# Patient Record
Sex: Male | Born: 1935 | Race: White | Hispanic: No | Marital: Married | Smoking: Never smoker
Health system: Southern US, Community
[De-identification: ages and names within clinical notes are randomized; demographics above are authoritative.]

## PROBLEM LIST (undated history)

## (undated) DIAGNOSIS — N200 Calculus of kidney: Secondary | ICD-10-CM

## (undated) DIAGNOSIS — Z8679 Personal history of other diseases of the circulatory system: Secondary | ICD-10-CM

## (undated) DIAGNOSIS — N320 Bladder-neck obstruction: Secondary | ICD-10-CM

## (undated) DIAGNOSIS — G8929 Other chronic pain: Secondary | ICD-10-CM

## (undated) DIAGNOSIS — I252 Old myocardial infarction: Secondary | ICD-10-CM

## (undated) DIAGNOSIS — M549 Dorsalgia, unspecified: Secondary | ICD-10-CM

## (undated) DIAGNOSIS — C61 Malignant neoplasm of prostate: Secondary | ICD-10-CM

## (undated) DIAGNOSIS — E119 Type 2 diabetes mellitus without complications: Secondary | ICD-10-CM

## (undated) DIAGNOSIS — Z86711 Personal history of pulmonary embolism: Secondary | ICD-10-CM

## (undated) DIAGNOSIS — K5730 Diverticulosis of large intestine without perforation or abscess without bleeding: Secondary | ICD-10-CM

## (undated) DIAGNOSIS — I959 Hypotension, unspecified: Secondary | ICD-10-CM

## (undated) DIAGNOSIS — Z85828 Personal history of other malignant neoplasm of skin: Secondary | ICD-10-CM

## (undated) DIAGNOSIS — F0390 Unspecified dementia without behavioral disturbance: Secondary | ICD-10-CM

## (undated) DIAGNOSIS — Z8669 Personal history of other diseases of the nervous system and sense organs: Secondary | ICD-10-CM

## (undated) DIAGNOSIS — I739 Peripheral vascular disease, unspecified: Secondary | ICD-10-CM

## (undated) DIAGNOSIS — E291 Testicular hypofunction: Secondary | ICD-10-CM

## (undated) DIAGNOSIS — T4145XA Adverse effect of unspecified anesthetic, initial encounter: Secondary | ICD-10-CM

## (undated) DIAGNOSIS — K219 Gastro-esophageal reflux disease without esophagitis: Secondary | ICD-10-CM

## (undated) DIAGNOSIS — M1990 Unspecified osteoarthritis, unspecified site: Secondary | ICD-10-CM

## (undated) DIAGNOSIS — G4733 Obstructive sleep apnea (adult) (pediatric): Secondary | ICD-10-CM

## (undated) DIAGNOSIS — Z9889 Other specified postprocedural states: Secondary | ICD-10-CM

## (undated) DIAGNOSIS — T8859XA Other complications of anesthesia, initial encounter: Secondary | ICD-10-CM

## (undated) DIAGNOSIS — E039 Hypothyroidism, unspecified: Secondary | ICD-10-CM

## (undated) DIAGNOSIS — R0789 Other chest pain: Secondary | ICD-10-CM

## (undated) DIAGNOSIS — Z860100 Personal history of colon polyps, unspecified: Secondary | ICD-10-CM

## (undated) DIAGNOSIS — Z8739 Personal history of other diseases of the musculoskeletal system and connective tissue: Secondary | ICD-10-CM

## (undated) DIAGNOSIS — F1021 Alcohol dependence, in remission: Secondary | ICD-10-CM

## (undated) DIAGNOSIS — Z8709 Personal history of other diseases of the respiratory system: Secondary | ICD-10-CM

## (undated) DIAGNOSIS — D649 Anemia, unspecified: Secondary | ICD-10-CM

## (undated) DIAGNOSIS — I440 Atrioventricular block, first degree: Secondary | ICD-10-CM

## (undated) DIAGNOSIS — R011 Cardiac murmur, unspecified: Secondary | ICD-10-CM

## (undated) DIAGNOSIS — I4510 Unspecified right bundle-branch block: Secondary | ICD-10-CM

## (undated) HISTORY — PX: LUMBAR FUSION: SHX111

## (undated) HISTORY — PX: CARDIOVASCULAR STRESS TEST: SHX262

## (undated) HISTORY — PX: PROSTATE CRYOABLATION: SUR358

## (undated) HISTORY — PX: TRANSTHORACIC ECHOCARDIOGRAM: SHX275

## (undated) HISTORY — PX: COLONOSCOPY W/ POLYPECTOMY: SHX1380

## (undated) HISTORY — PX: OTHER SURGICAL HISTORY: SHX169

## (undated) HISTORY — PX: TONSILLECTOMY: SUR1361

---

## 1973-03-11 HISTORY — PX: OTHER SURGICAL HISTORY: SHX169

## 1981-03-11 HISTORY — PX: HIATAL HERNIA REPAIR: SHX195

## 1996-03-11 HISTORY — PX: VENTRAL HERNIA REPAIR: SHX424

## 1998-05-19 ENCOUNTER — Inpatient Hospital Stay (HOSPITAL_COMMUNITY): Admission: EM | Admit: 1998-05-19 | Discharge: 1998-05-21 | Payer: Self-pay | Admitting: Emergency Medicine

## 1998-05-19 ENCOUNTER — Encounter: Payer: Self-pay | Admitting: Emergency Medicine

## 2000-05-26 ENCOUNTER — Encounter: Payer: Self-pay | Admitting: Family Medicine

## 2000-05-26 ENCOUNTER — Encounter: Admission: RE | Admit: 2000-05-26 | Discharge: 2000-05-26 | Payer: Self-pay | Admitting: Family Medicine

## 2001-10-14 ENCOUNTER — Ambulatory Visit (HOSPITAL_BASED_OUTPATIENT_CLINIC_OR_DEPARTMENT_OTHER): Admission: RE | Admit: 2001-10-14 | Discharge: 2001-10-14 | Payer: Self-pay | Admitting: Surgery

## 2001-10-14 HISTORY — PX: INGUINAL HERNIA REPAIR: SUR1180

## 2003-01-14 ENCOUNTER — Encounter: Admission: RE | Admit: 2003-01-14 | Discharge: 2003-01-14 | Payer: Self-pay | Admitting: Orthopedic Surgery

## 2003-01-25 ENCOUNTER — Ambulatory Visit (HOSPITAL_COMMUNITY): Admission: RE | Admit: 2003-01-25 | Discharge: 2003-01-25 | Payer: Self-pay | Admitting: Orthopedic Surgery

## 2003-01-25 HISTORY — PX: KNEE ARTHROSCOPY W/ MENISCECTOMY: SHX1879

## 2004-01-13 ENCOUNTER — Ambulatory Visit (HOSPITAL_COMMUNITY): Admission: RE | Admit: 2004-01-13 | Discharge: 2004-01-13 | Payer: Self-pay | Admitting: Gastroenterology

## 2005-02-11 ENCOUNTER — Ambulatory Visit (HOSPITAL_COMMUNITY): Admission: RE | Admit: 2005-02-11 | Discharge: 2005-02-11 | Payer: Self-pay | Admitting: Urology

## 2005-04-12 ENCOUNTER — Ambulatory Visit (HOSPITAL_COMMUNITY): Admission: RE | Admit: 2005-04-12 | Discharge: 2005-04-13 | Payer: Self-pay | Admitting: Urology

## 2005-10-10 ENCOUNTER — Ambulatory Visit: Payer: Self-pay | Admitting: Pulmonary Disease

## 2005-11-12 ENCOUNTER — Ambulatory Visit (HOSPITAL_BASED_OUTPATIENT_CLINIC_OR_DEPARTMENT_OTHER): Admission: RE | Admit: 2005-11-12 | Discharge: 2005-11-12 | Payer: Self-pay | Admitting: Pulmonary Disease

## 2005-11-17 ENCOUNTER — Ambulatory Visit: Payer: Self-pay | Admitting: Pulmonary Disease

## 2005-11-29 ENCOUNTER — Emergency Department (HOSPITAL_COMMUNITY): Admission: EM | Admit: 2005-11-29 | Discharge: 2005-11-29 | Payer: Self-pay | Admitting: Emergency Medicine

## 2005-12-10 ENCOUNTER — Ambulatory Visit: Payer: Self-pay | Admitting: Pulmonary Disease

## 2005-12-24 ENCOUNTER — Encounter: Admission: RE | Admit: 2005-12-24 | Discharge: 2005-12-24 | Payer: Self-pay | Admitting: Orthopedic Surgery

## 2006-01-22 ENCOUNTER — Ambulatory Visit: Payer: Self-pay | Admitting: Pulmonary Disease

## 2007-05-05 ENCOUNTER — Encounter: Admission: RE | Admit: 2007-05-05 | Discharge: 2007-05-05 | Payer: Self-pay | Admitting: Orthopedic Surgery

## 2008-10-18 ENCOUNTER — Encounter (INDEPENDENT_AMBULATORY_CARE_PROVIDER_SITE_OTHER): Payer: Self-pay | Admitting: Internal Medicine

## 2008-10-18 ENCOUNTER — Inpatient Hospital Stay (HOSPITAL_COMMUNITY): Admission: EM | Admit: 2008-10-18 | Discharge: 2008-10-23 | Payer: Self-pay | Admitting: Emergency Medicine

## 2009-12-09 HISTORY — PX: OTHER SURGICAL HISTORY: SHX169

## 2010-03-31 ENCOUNTER — Encounter: Payer: Self-pay | Admitting: Orthopedic Surgery

## 2010-05-23 ENCOUNTER — Other Ambulatory Visit (HOSPITAL_COMMUNITY): Payer: Self-pay | Admitting: Urology

## 2010-05-23 DIAGNOSIS — C61 Malignant neoplasm of prostate: Secondary | ICD-10-CM

## 2010-06-01 ENCOUNTER — Ambulatory Visit (HOSPITAL_COMMUNITY): Payer: Self-pay

## 2010-06-01 ENCOUNTER — Encounter (HOSPITAL_COMMUNITY): Payer: Self-pay

## 2010-06-01 ENCOUNTER — Encounter (HOSPITAL_COMMUNITY)
Admission: RE | Admit: 2010-06-01 | Discharge: 2010-06-01 | Disposition: A | Discharge status: Home / Self Care | Payer: Medicare Other | Source: Ambulatory Visit | Attending: Urology | Admitting: Urology

## 2010-06-01 DIAGNOSIS — C61 Malignant neoplasm of prostate: Secondary | ICD-10-CM | POA: Insufficient documentation

## 2010-06-01 MED ORDER — TECHNETIUM TC 99M MEDRONATE IV KIT
24.0000 | PACK | Freq: Once | INTRAVENOUS | Status: AC | PRN
Start: 2010-06-01 — End: 2010-06-01
  Administered 2010-06-01: 24 via INTRAVENOUS

## 2010-06-16 LAB — URINALYSIS, ROUTINE W REFLEX MICROSCOPIC
Bilirubin Urine: NEGATIVE
Glucose, UA: NEGATIVE mg/dL
Hgb urine dipstick: NEGATIVE
Ketones, ur: NEGATIVE mg/dL
Nitrite: NEGATIVE
Protein, ur: NEGATIVE mg/dL
Specific Gravity, Urine: 1.012 (ref 1.005–1.030)
Urobilinogen, UA: 0.2 mg/dL (ref 0.0–1.0)
pH: 5.5 (ref 5.0–8.0)

## 2010-06-16 LAB — CBC
HCT: 37.9 % — ABNORMAL LOW (ref 39.0–52.0)
HCT: 42 % (ref 39.0–52.0)
Hemoglobin: 11.6 g/dL — ABNORMAL LOW (ref 13.0–17.0)
Hemoglobin: 12.4 g/dL — ABNORMAL LOW (ref 13.0–17.0)
Hemoglobin: 12.5 g/dL — ABNORMAL LOW (ref 13.0–17.0)
Hemoglobin: 13.7 g/dL (ref 13.0–17.0)
MCHC: 32.7 g/dL (ref 30.0–36.0)
MCHC: 32.8 g/dL (ref 30.0–36.0)
MCHC: 32.9 g/dL (ref 30.0–36.0)
MCHC: 33.1 g/dL (ref 30.0–36.0)
MCHC: 33.4 g/dL (ref 30.0–36.0)
MCV: 89.7 fL (ref 78.0–100.0)
MCV: 90.7 fL (ref 78.0–100.0)
Platelets: 150 10*3/uL (ref 150–400)
Platelets: 235 10*3/uL (ref 150–400)
RBC: 4.13 MIL/uL — ABNORMAL LOW (ref 4.22–5.81)
RBC: 4.14 MIL/uL — ABNORMAL LOW (ref 4.22–5.81)
RBC: 4.63 MIL/uL (ref 4.22–5.81)
RDW: 15.7 % — ABNORMAL HIGH (ref 11.5–15.5)
RDW: 15.8 % — ABNORMAL HIGH (ref 11.5–15.5)
RDW: 15.8 % — ABNORMAL HIGH (ref 11.5–15.5)
RDW: 16 % — ABNORMAL HIGH (ref 11.5–15.5)
RDW: 16.1 % — ABNORMAL HIGH (ref 11.5–15.5)
WBC: 10 10*3/uL (ref 4.0–10.5)
WBC: 7.4 10*3/uL (ref 4.0–10.5)

## 2010-06-16 LAB — COMPREHENSIVE METABOLIC PANEL
ALT: 12 U/L (ref 0–53)
AST: 14 U/L (ref 0–37)
Albumin: 3 g/dL — ABNORMAL LOW (ref 3.5–5.2)
Alkaline Phosphatase: 72 U/L (ref 39–117)
BUN: 15 mg/dL (ref 6–23)
CO2: 25 mEq/L (ref 19–32)
Calcium: 8.4 mg/dL (ref 8.4–10.5)
Chloride: 109 mEq/L (ref 96–112)
Creatinine, Ser: 1.76 mg/dL — ABNORMAL HIGH (ref 0.4–1.5)
GFR calc Af Amer: 46 mL/min — ABNORMAL LOW (ref >60)
GFR calc non Af Amer: 38 mL/min — ABNORMAL LOW (ref >60)
Glucose, Bld: 73 mg/dL (ref 70–99)
Potassium: 3.6 mEq/L (ref 3.5–5.1)
Sodium: 140 mEq/L (ref 135–145)
Total Bilirubin: 0.4 mg/dL (ref 0.3–1.2)
Total Protein: 5.9 g/dL — ABNORMAL LOW (ref 6.0–8.3)

## 2010-06-16 LAB — BASIC METABOLIC PANEL
BUN: 18 mg/dL (ref 6–23)
BUN: 8 mg/dL (ref 6–23)
CO2: 25 mEq/L (ref 19–32)
CO2: 26 mEq/L (ref 19–32)
CO2: 26 mEq/L (ref 19–32)
CO2: 26 mEq/L (ref 19–32)
CO2: 27 mEq/L (ref 19–32)
Calcium: 8.2 mg/dL — ABNORMAL LOW (ref 8.4–10.5)
Calcium: 8.3 mg/dL — ABNORMAL LOW (ref 8.4–10.5)
Calcium: 8.3 mg/dL — ABNORMAL LOW (ref 8.4–10.5)
Calcium: 8.3 mg/dL — ABNORMAL LOW (ref 8.4–10.5)
Calcium: 9.4 mg/dL (ref 8.4–10.5)
Chloride: 107 mEq/L (ref 96–112)
Creatinine, Ser: 0.94 mg/dL (ref 0.4–1.5)
Creatinine, Ser: 0.97 mg/dL (ref 0.4–1.5)
Creatinine, Ser: 1.01 mg/dL (ref 0.4–1.5)
Creatinine, Ser: 1.05 mg/dL (ref 0.4–1.5)
Creatinine, Ser: 2.36 mg/dL — ABNORMAL HIGH (ref 0.4–1.5)
GFR calc Af Amer: 33 mL/min — ABNORMAL LOW (ref >60)
GFR calc Af Amer: >60 mL/min (ref >60)
GFR calc Af Amer: >60 mL/min (ref >60)
GFR calc Af Amer: >60 mL/min (ref >60)
GFR calc non Af Amer: 27 mL/min — ABNORMAL LOW (ref >60)
GFR calc non Af Amer: >60 mL/min (ref >60)
GFR calc non Af Amer: >60 mL/min (ref >60)
GFR calc non Af Amer: >60 mL/min (ref >60)
Glucose, Bld: 120 mg/dL — ABNORMAL HIGH (ref 70–99)
Glucose, Bld: 122 mg/dL — ABNORMAL HIGH (ref 70–99)
Glucose, Bld: 125 mg/dL — ABNORMAL HIGH (ref 70–99)
Glucose, Bld: 68 mg/dL — ABNORMAL LOW (ref 70–99)
Glucose, Bld: 85 mg/dL (ref 70–99)
Potassium: 3.8 mEq/L (ref 3.5–5.1)
Potassium: 4.2 mEq/L (ref 3.5–5.1)
Sodium: 138 mEq/L (ref 135–145)
Sodium: 138 mEq/L (ref 135–145)
Sodium: 139 mEq/L (ref 135–145)
Sodium: 140 mEq/L (ref 135–145)

## 2010-06-16 LAB — DIFFERENTIAL
Basophils Absolute: 0 10*3/uL (ref 0.0–0.1)
Basophils Absolute: 0 10*3/uL (ref 0.0–0.1)
Basophils Relative: 0 % (ref 0–1)
Basophils Relative: 0 % (ref 0–1)
Eosinophils Absolute: 0.1 10*3/uL (ref 0.0–0.7)
Eosinophils Absolute: 0.3 10*3/uL (ref 0.0–0.7)
Eosinophils Relative: 1 % (ref 0–5)
Lymphocytes Relative: 14 % (ref 12–46)
Lymphs Abs: 1.4 10*3/uL (ref 0.7–4.0)
Monocytes Absolute: 0.6 10*3/uL (ref 0.1–1.0)
Monocytes Absolute: 0.7 10*3/uL (ref 0.1–1.0)
Monocytes Relative: 7 % (ref 3–12)
Neutro Abs: 5.2 10*3/uL (ref 1.7–7.7)
Neutro Abs: 7.8 10*3/uL — ABNORMAL HIGH (ref 1.7–7.7)
Neutrophils Relative %: 71 % (ref 43–77)
Neutrophils Relative %: 78 % — ABNORMAL HIGH (ref 43–77)

## 2010-06-16 LAB — GLUCOSE, CAPILLARY
Glucose-Capillary: 103 mg/dL — ABNORMAL HIGH (ref 70–99)
Glucose-Capillary: 115 mg/dL — ABNORMAL HIGH (ref 70–99)
Glucose-Capillary: 117 mg/dL — ABNORMAL HIGH (ref 70–99)
Glucose-Capillary: 117 mg/dL — ABNORMAL HIGH (ref 70–99)
Glucose-Capillary: 120 mg/dL — ABNORMAL HIGH (ref 70–99)
Glucose-Capillary: 139 mg/dL — ABNORMAL HIGH (ref 70–99)
Glucose-Capillary: 183 mg/dL — ABNORMAL HIGH (ref 70–99)
Glucose-Capillary: 241 mg/dL — ABNORMAL HIGH (ref 70–99)
Glucose-Capillary: 42 mg/dL — ABNORMAL LOW (ref 70–99)
Glucose-Capillary: 45 mg/dL — ABNORMAL LOW (ref 70–99)
Glucose-Capillary: 79 mg/dL (ref 70–99)
Glucose-Capillary: 86 mg/dL (ref 70–99)

## 2010-06-16 LAB — PROTIME-INR
INR: 1 (ref 0.00–1.49)
INR: 2.3 — ABNORMAL HIGH (ref 0.00–1.49)
INR: 2.3 — ABNORMAL HIGH (ref 0.00–1.49)
INR: 2.9 — ABNORMAL HIGH (ref 0.00–1.49)
Prothrombin Time: 12.9 seconds (ref 11.6–15.2)
Prothrombin Time: 25.3 seconds — ABNORMAL HIGH (ref 11.6–15.2)
Prothrombin Time: 25.4 seconds — ABNORMAL HIGH (ref 11.6–15.2)

## 2010-06-16 LAB — HEPARIN LEVEL (UNFRACTIONATED)
Heparin Unfractionated: 0.51 IU/mL (ref 0.30–0.70)
Heparin Unfractionated: 0.59 IU/mL (ref 0.30–0.70)
Heparin Unfractionated: 1.04 IU/mL — ABNORMAL HIGH (ref 0.30–0.70)

## 2010-06-16 LAB — CK TOTAL AND CKMB (NOT AT ARMC)
CK, MB: 1.7 ng/mL (ref 0.3–4.0)
CK, MB: 1.8 ng/mL (ref 0.3–4.0)
Relative Index: INVALID (ref 0.0–2.5)
Relative Index: INVALID (ref 0.0–2.5)
Relative Index: INVALID (ref 0.0–2.5)
Total CK: 71 U/L (ref 7–232)
Total CK: 76 U/L (ref 7–232)
Total CK: 88 U/L (ref 7–232)

## 2010-06-16 LAB — APTT: aPTT: 106 seconds — ABNORMAL HIGH (ref 24–37)

## 2010-06-16 LAB — HEMOGLOBIN A1C
Hgb A1c MFr Bld: 7.6 % — ABNORMAL HIGH (ref 4.6–6.1)
Mean Plasma Glucose: 171 mg/dL

## 2010-06-16 LAB — CULTURE, BLOOD (ROUTINE X 2): Culture: NO GROWTH

## 2010-06-16 LAB — URINE CULTURE: Colony Count: 10000

## 2010-06-16 LAB — LIPID PANEL
Cholesterol: 102 mg/dL (ref 0–200)
HDL: 45 mg/dL (ref >39)
LDL Cholesterol: 51 mg/dL (ref 0–99)
Total CHOL/HDL Ratio: 2.3 RATIO
Triglycerides: 30 mg/dL (ref <150)

## 2010-06-16 LAB — D-DIMER, QUANTITATIVE: D-Dimer, Quant: 0.58 ug/mL-FEU — ABNORMAL HIGH (ref 0.00–0.48)

## 2010-06-16 LAB — TROPONIN I: Troponin I: 0.03 ng/mL (ref 0.00–0.06)

## 2010-07-24 NOTE — H&P (Signed)
NAME:  Barry Mcdaniel, Barry Mcdaniel NO.:  [id number]   MEDICAL RECORD NO.:  [id number]          PATIENT TYPE:  EMS   LOCATION:  MAJO                         FACILITY:  [facility]   PHYSICIAN:  Michiel Cowboy, MDDATE OF BIRTH:  August 29, 1932   DATE OF ADMISSION:  10/18/2008  DATE OF DISCHARGE:                              HISTORY & PHYSICAL   PRIMARY CARE Endrit Gittins:  Dr. Lucianne Muss.   CHIEF COMPLAINT:  Chest pain.   The patient is a 77 year old gentleman with past medical history  significant for diabetes, GERD and high cholesterol, as well as mild  hypertension.  The patient has been at his baseline of health.  On  Monday, he had a very extensive day.  He had mowed the grass outside and  worked basically the whole time outside with very little p.o. intake.  By the end of the day, he started to feel weak and unwell.  He took his  blood pressure and was noted to be slightly low for him at 100/80.  He  continued to feel poorly and continued to measure his blood pressure.  Eventually, it started to drift down with systolics around 50s.  Of  note, around 10:00 p.m., he developed substernal chest pain,  nonradiating, as well as diaphoresis.  This lasted for about 30 minutes  and then went away.  He also had some neck pain throughout the night.  He has chronic back pain which was not indifferent today than his usual  presentation.  The chest pain did not radiate to his arms.  The neck  pain he attributed to maybe muscle spasm.  At this point, he is chest  pain free.  He was evaluated by the emergency department and given IV  fluids and his blood pressure started to come up.  He is currently  feeling much better, but being admitted for further evaluation.   REVIEW OF SYSTEMS:  As per HPI, otherwise unremarkable.  No diarrhea, no  vomiting, no nausea.  The patient did feel very weak and presyncopal  while his blood pressure was down.   PAST MEDICAL HISTORY:  1. Diabetes.  2. Acid  reflux.  3. Hypertension.  He is only taking a small dose of ramipril for it.  4. Hypothyroidism.  5. High cholesterol.   SOCIAL HISTORY:  The patient does not smoke or currently drink.  He had  a very remote history of alcohol abuse in the 1970s, but has been clean  ever since.   FAMILY HISTORY:  Significant for 12 siblings, seven of them dying with  coronary artery disease, but at an elderly age.   ALLERGIES:  PENICILLIN.   MEDICATIONS:  1. Actos 50 mg daily.  2. Aspirin 81 mg daily.  3. Crestor 20 mg daily.  4. Flomax 0.4 mg daily.  5. Neurontin 300 mg three times a day.  6. Iron.  7. Ramipril 2.5 daily.  8. Synthroid 75 mcg daily.  9. Insulin 78 units in the morning and NovoLog sliding scale.   PHYSICAL EXAMINATION:  VITAL SIGNS:  Temperature not done.  Blood  pressure initially 91/49, now up to 125/80s, pulse 118, now down to 92,  respirations 25.  Saturating 97% on room air.  GENERAL:  The patient currently appears to be in no acute distress.  HEENT:  Head nontraumatic.  Dry mucous membranes.  Pale mucosa.  Diminished skin turgor.  LUNGS:  Clear to auscultation, except at the  bases with slight crackles.  HEART:  Regular rate and rhythm.  No murmurs appreciated.  ABDOMEN:  Soft, nontender, nondistended.  LOWER EXTREMITIES:  Without clubbing, cyanosis or edema.  NEUROLOGICAL:  Intact.  SKIN:  Multiple areas of skin damage noted.   LABORATORY DATA:  White blood cell count 10, hemoglobin 13.7.  Sodium  140, potassium 4.2, creatinine 2.34.  He has no history of renal  disease.  Cardiac enzymes negative.  Chest x-ray showing hyperaeration,  but no infiltrate.  EKG showing questionable early repolarization in  lateral leads, but ischemic changes could not be ruled out.  Chest with  hyperaeration.  D-dimer 0.58.   ASSESSMENT/PLAN:  This is a 77 year old gentleman with;  1. Hypertension.  2. Dehydration.  3. Renal failure.  4. Chest pain.   1. Chest pain.  Given risk  factors, we will cycle cardiac enzymes.  I      am thinking this may have been demand ischemia secondary to      hypertension.  ED started him on heparin given elevated D-dimer.      We will continue that until PE can be ruled out.  Put in step-down      given recent hypotension.  If cardiac markers are positive, would      call cardiology.  We will order 2-D echo.  2. Positive D-dimer.  We will check a VQ scan in a.m. and heparinize      until then.  3. Acute renal failure, likely second to dehydration.  We will give IV      fluids.  Check orthostatics.  Check renal ultrasound.  Strict Is      and Os.  4. Questionable history of hypertension.  He was taking a low dose of      ramipril.  I doubt that is a true diagnosis of hypertension and he      is probably taking it for his diabetes.  Currently hypotensive.  We      will hold ramipril.  5. Diabetes.  We will continue Lantus and do sliding scale for now,      hold Actos.  6. GERD, continue Protonix.  7. Hypothyroidism, continue Synthroid.  8. Prophylaxis.  Protonix plus heparin drip.     Michiel Cowboy, MD  Electronically Signed    AVD/MEDQ  D:  10/18/2008  T:  10/18/2008  Job:  [id number]   cc:   Reather Littler, M.D.

## 2010-07-24 NOTE — Discharge Summary (Signed)
NAME:  Barry Mcdaniel, Barry Mcdaniel NO.:  [id number]   MEDICAL RECORD NO.:  [id number]          PATIENT TYPE:  INP   LOCATION:  6737                         FACILITY:  [facility]   PHYSICIAN:  Ramiro Harvest, MD    DATE OF BIRTH:  09/04/1932   DATE OF ADMISSION:  10/17/2008  DATE OF DISCHARGE:  10/23/2008                               DISCHARGE SUMMARY   PRIMARY CARE PHYSICIAN:  Reather Littler, MD, of Endocrinology.   DISCHARGE DIAGNOSES:  1. Acute pulmonary embolism.  2. Orthostasis, resolved.  3. Hypokalemia, resolved.  4. Acute renal failure, resolved.  5. Diabetes mellitus.  6. Gastroesophageal reflux disease.  7. Hypertension.  8. Hypothyroidism.  9. Hyperlipidemia.   DISCHARGE MEDICATIONS:  1. Coumadin 5 mg p.o. daily.  2. Lantus 45 units subcu daily.  3. Sliding scale insulin NovoLog.  4. Propoxyphene/APAP 100/650 1 tab p.o. q.4 hours p.r.n. pain.  5. Synthroid 75 mcg p.o. daily.  6. Ramipril 2.5 mg p.o. daily.  7. Iron 1 tablet p.o. daily.  8. Gabapentin 300 mg p.o. t.i.d.  9. Flomax 0.4 mg p.o. q.h.s.  10.Crestor 20 mg p.o. daily.  11.Aspirin 81 mg p.o. daily.  12.Actos 15 mg p.o. daily   DISPOSITION AND FOLLOWUP:  The patient will be discharged home.  The  patient is to follow up at his PCP's office on October 24, 2008, for an  INR check.  At that point in time, it will be determined per PCP as to  further Coumadin dosages.  The patient will be discharged home on  Coumadin 5 mg daily for now.  The patient will also need to follow up  with his PCP 1 week post discharge.  We will need to get a BMET done to  follow up on electrolytes and renal function as well as a CBC.   CONSULTATIONS DONE:  None.   PROCEDURES PERFORMED:  1. A chest x-ray was done October 18, 2008, that showed hyperaeration,      no active lung disease.  2. A VQ scan was done October 18, 2008, that showed a high probability      for pulmonary embolus.  3. A renal ultrasound was done October 18, 2008, that showed bilateral      renal mild cortical thinning, no hydronephrosis or diagnostic renal      calculus.  4. A 2-D echo was done on October 18, 2008, which showed a normal size      left ventricle, wall thickness was increased in the pattern of mild      LVH.  There was focal basal hypertrophy.  Systolic function was      normal, EF 55 to 60%.  There was no dynamic obstruction.  Wall      motion was normal.  There was no regional wall motion      abnormalities.  Doppler parameters were consistent with abnormal      left ventricular relaxation (grade 1 diastolic dysfunction), aortic      valve or bicuspid morphology cannot be excluded, mildly calcified      leaflets.  Transvalvular velocity was minimally increased.  There      was mild stenosis, trivial regurgitation.  Mitral valve showed mild      regurgitation.  Left atrium was mildly dilated.   ADMITTING HISTORY AND PHYSICAL:  Barry Mcdaniel is a 77 year old  gentleman, past medical history significant for diabetes,  gastroesophageal reflux disease, hyperlipidemia, as well as mild  hypertension.  Patient had been at his baseline of health.  On Monday,  he had had a very extensive day.  Patient had mowed the grass outside  and essentially basically worked the whole time outside with very little  oral intake.  By the end of the day, patient started to feel weak and  unwell.  He took his blood pressure and it was noted to be slightly low  at 100/80.  He continued to feel poorly and continued to measure his  blood pressures.  Eventually, it started to drift down with systolics  around the 50s at around 10:00 p.m.  The patient then developed  substernal chest pain, which was nonradiating, as well as diaphoresis.  This lasted about 30 minutes and then resolved.  Patient then had some  neck pain throughout the night.  He has chronic back pain, which was not  different on the day of admission than his usual presentation.  The   patient's chest pain did not radiate to his arms.  The patient's neck  pain he attributed to be likely secondary to a muscle spasm.  At that  point, he was chest pain free at the time of the interview.  Patient was  evaluated by the ED, given IV fluids, blood pressure started to improve,  and was feeling much better at the time of admission.   PHYSICAL EXAMINATION:  Per admitting physician:  VITAL SIGNS:  Blood pressure initially was 91/49 then up to 125/80,  pulse of 118 down to 92, respirations 25, sating 97% on room air.  GENERAL:  The patient in no acute distress.  HEENT: Normocephalic, atraumatic.  Pupils equal, round, and reactive to  light and accommodation.  Extraocular movements intact.  Oropharynx was  clear, no lesions, no exudates, dry mucous membranes.  RESPIRATORY:  Lungs were clear to auscultation bilaterally except slight crackles in  the bases.  CARDIOVASCULAR:  Regular rate, rhythm, no murmurs, rubs, or gallops.  ABDOMEN:  Soft, nontender, and nondistended, positive bowel sounds.  EXTREMITIES:  No clubbing, cyanosis, or edema.  NEUROLOGICAL:  Patient was intact.  SKIN:  With multiple areas of skin damage noted.   ADMISSION LABORATORY DATA:  CBC:  White count 10, hemoglobin 13.7,  sodium 140, potassium 4.2, creatinine 2.34.  No history of renal  disease.  Cardiac enzymes were negative.  Chest x-ray showed  hyperaeration, but no infiltrate.  EKG showed questionable early  repolarization in the lateral leads, but ischemic changes could not be  ruled out, chest with hyperaeration.  D-dimer was elevated at 0.58.   HOSPITAL COURSE BY PROBLEM:  1. Acute pulmonary embolism:  Patient was admitted into the hospital      for chest pain, rule out MI, as he had presented with some chest      pain and he was admitted for cardiac enzymes to be cycled to make      sure it was not cardiac in nature.  A 2-D echo was obtained with      results as stated above.  Patient did have an  elevated D-dimer but  secondary to his acute renal insufficiency.  He could not get a CT      angiogram and as such a VQ scan needed to be done and as such      patient was placed empirically on full-dose heparin at the time.      Cardiac enzymes came back negative.  Patient was monitored.  The VQ      scan came back positive with a high probability for a PE.  The      patient was subsequently changed from heparin to Lovenox and given      some Lovenox teachings.  Patient was then placed on oral Coumadin      and his levels were monitored.  Goal INR was to be between 2 to 3.      Patient improved clinically and symptomatically during the      hospitalization such that by day of discharge the patient was chest      pain free.  The patient was monitored with his Lovenox.  His INR      started at 1.0 and then subsequently went up to about 2.3 and then      further back up to 2.9 and then back down to 2.3.  Patient remained      stable.  Patient did get 48 hours in the therapeutic window with      both Coumadin and overlapped with Lovenox.  The patient did have      also a 5-day overlap with Lovenox.  It was deemed that patient was      in stable condition and could be discharged home on Coumadin.      Patient will need a followup on October 24, 2008, for INR check at      his PCP's office.  He will also need to follow up with his visit      with his PCP 1 week post discharge for further evaluation and      management.  Patient will be discharged home on the Coumadin 5 mg      daily for now as this is patient's first episode of embolism/clot.      The patient will likely need anywhere from 6 to 12 months treatment      of anticoagulation.  Patient was given Lovenox teaching as well as      Coumadin teaching during the hospitalization and patient will be      discharged in a stable and improved condition.  2. Orthostasis:  The patient was noted to be orthostatic during the       hospitalization and was hydrated with IV fluids and monitored.      Patient's orthostasis resolved by day of discharge.  3. Acute renal failure:  On admission, patient was noted to be in      acute renal failure with a creatinine of 2.34.  It was felt to be      prerenal in nature secondary to dehydration.  A renal ultrasound      was obtained with results as stated above, which was essentially      negative for any hydronephrosis.  The patient was hydrated with IV      fluids with daily improvement in his renal function such that by      day of discharge the patient's acute renal failure had resolved and      his creatinine was down to 0.94 with a GFR greater than 60.  Patient will be discharged in stable condition.  Will need a      followup BMET per his PCP's office to follow up on his electrolytes      and renal function.  4.  Hypokalemia:  During the hospitalization,      patient was noted to be hypokalemic.  Patient's potassium was      repleted and had resolved by day of discharge.  4. Diabetes:  The patient was initially placed on his home dose of      Lantus 48 units as well as a sliding scale insulin; however,      patient had some hypoglycemic episodes during the hospitalization      with his CBG going as low as 45.  Patient was given a snack at the      time and monitored.  His Lantus dose was then decreased to 45 units      daily and with no further hypoglycemic episodes.  Patient will be      discharged home on Lantus 45 units and a sliding scale insulin and      will need to follow up with his PCP for further management of his      diabetes.   The rest of patient's chronic medical issues remained stable throughout  the hospitalization and patient will be discharged in stable and  improved condition.   DISCHARGE VITAL SIGNS:  Temperature 98.2, pulse of 64, blood pressure  138/76, respirations 17, sating 95% on room air.   DISCHARGE LABORATORY DATA:  Sodium 139,  potassium 3.8, chloride 105,  bicarb 26, glucose 68, BUN 11, creatinine 0.94, and a calcium of 8.3. PT  of 25.4, INR of 2.3.  CBC with a white count of 6.9, hemoglobin 12.4,  hematocrit 37.6, platelet count of 156.   It was a pleasure taking care of Barry Mcdaniel.      Ramiro Harvest, MD  Electronically Signed    DT/MEDQ  D:  10/23/2008  T:  10/23/2008  Job:  [id number]   cc:   Reather Littler, M.D.

## 2010-07-27 NOTE — Op Note (Signed)
NAME:  Barry Mcdaniel, Barry Mcdaniel                             ACCOUNT NO.:  [id number]   MEDICAL RECORD NO.:  [id number]                   PATIENT TYPE:  AMB   LOCATION:  DAY                                  FACILITY:  [facility]   PHYSICIAN:  Georges Lynch. Gioffre, M.D.             DATE OF BIRTH:  02/11/1933   DATE OF PROCEDURE:  01/25/2003  DATE OF DISCHARGE:                                 OPERATIVE REPORT   PREOPERATIVE DIAGNOSES:  1. Degenerative arthritis, right knee.  2. Degenerative tears of the posterior horn of the medial meniscus, right     knee.  3. Degenerative tears of the lateral meniscus, right knee.  4. Chondromalacia of the patella.   POSTOPERATIVE DIAGNOSES:  1. Degenerative arthritis, right knee.  2. Degenerative tears of the posterior horn of the medial meniscus, right     knee.  3. Degenerative tears of the lateral meniscus, right knee.  4. Chondromalacia of the patella.   OPERATION:  1. Diagnostic arthroscopy, right knee.  2. Medial meniscectomy, right knee.  3. Lateral meniscectomy, right knee.  4. Abrasion chondroplasty, patella.  5. Abrasion chondroplasty in the medial femoral condyle.  6. Abrasion chondroplasty, lateral femoral condyle.   SURGEON:  Georges Lynch. Darrelyn Hillock, M.D.   ASSISTANT:  Nurse.   DESCRIPTION OF PROCEDURE:  Under general anesthesia, a routine orthopedic  prep and drape of the right knee was carried out.  The patient had 500 mg of  vancomycin IV.  A small punctate incision made in the suprapatellar pouch,  the inflow cannula was inserted, and the knee was distended with saline.  Another small punctate incision was made in the anterolateral joint and the  arthroscope was entered.  A complete diagnostic arthroscopy was carried out.  Following this the shaver suction device was inserted from the medial side.  I did a partial medial meniscectomy.  He had an irregular tear of the  posterior horn of the medial meniscus.  While on the medial side I did an  abrasion chondroplasty of the medial femoral condyle.  There was an orange  peel effect of the cartilage that was literally peeling off the bone.  We  went on to the lateral side.  There was a similar thing but not as severe.  We did an abrasion chondroplasty of the lateral femoral condyle.  We then  noted marked degenerative, irregular tears of the lateral meniscus.  I did a  partial lateral meniscectomy.  The cruciates were intact.  I went up into  the suprapatellar pouch, did a little synovectomy there, and did an abrasion  chondroplasty of the patella.  Thoroughly irrigated out the knee, removed  all the fluids, closed all three punctate incisions with 3-0 nylon suture,  and injected 30 mL of 0.5% Marcaine with epinephrine in the knee joint, and  a sterile Neosporin dressing was applied.   FOLLOW-UP CARE:  1. He will  be on Percocet 10/350 mg for pain, one every four hours p.r.n.  2. He will be on crutches, partial to full weightbearing.  3. He will be on Bufferin one twice a day as an anticoagulant.  4. I will see him in the office in two weeks or prior to this if he has any     problems.                                               Ronald A. Darrelyn Hillock, M.D.    RAG/MEDQ  D:  01/25/2003  T:  01/25/2003  Job:  [id number]

## 2010-07-27 NOTE — Procedures (Signed)
NAME:  Barry Mcdaniel, Barry Mcdaniel NO.:  [id number]   MEDICAL RECORD NO.:  [id number]          PATIENT TYPE:  OUT   LOCATION:  SLEEP CENTER                 FACILITY:  [facility]   PHYSICIAN:  Barbaraann Share, MD,FCCPDATE OF BIRTH:  [birth year] 07, 1932   DATE OF STUDY:  11/12/2005                              NOCTURNAL POLYSOMNOGRAM   REFERRING PHYSICIAN:  Dr. Maree Krabbe. Clance   INDICATION FOR STUDY:  Hypersomnia with sleep apnea.   EPWORTH SLEEPINESS SCORE:  9   SLEEP ARCHITECTURE:  The patient had a total sleep time of 334 minutes with  very decreased REM and never achieved slow wave sleep.  Sleep onset latency  was prolonged at 37 minutes and REM onset was normal.  Sleep efficiency was  decreased at 76%.   RESPIRATORY DATA:  The patient was found to have 147 hypopneas and 101  apneas for a respiratory disturbance index of 45 events per hour.  Events  were more common in the supine position and there was moderate snoring noted  throughout.   OXYGEN DATA:  There was O2 desaturation as low as 90% with the patient's  obstructive events.   CARDIAC DATA:  No clinically significant cardiac arrhythmias.   MOVEMENT/PARASOMNIAS:  The patient was found to have 445 leg jerks with two  per hour resulting in arousal or awakening.   IMPRESSION/RECOMMENDATIONS:  1. Severe obstructive sleep apnea/hypopnea syndrome with a respiratory      disturbance index of 45 events per hour and O2 desaturation as low as      90%.  Treatment for this degree of sleep apnea should focus primarily      on weight loss if applicable as well as C-PAP.  2. Very large numbers of leg jerks with what appears to be significant      sleep disruption.  It is unclear whether this      is related to the patient's sleep disordered breathing, or whether the      patient has a concomitant primary movement disorder of sleep.  Clinical      correlation is suggested.         ______________________________  Barbaraann Share,  MD,FCCP  Diplomate, American Board of Sleep  Medicine     [facility]/MEDQ  D:  11/21/2005 14:25:26  T:  11/22/2005 11:29:26  Job:  [id number]

## 2010-07-27 NOTE — Op Note (Signed)
NAME:  Barry Mcdaniel, Barry Mcdaniel                   ACCOUNT NO.:  [id number]   MEDICAL RECORD NO.:  [id number]          PATIENT TYPE:  AMB   LOCATION:  ENDO                         FACILITY:  [facility]   PHYSICIAN:  James L. Malon Kindle., M.D.DATE OF BIRTH:  06-07-1932   DATE OF PROCEDURE:  01/13/2004  DATE OF DISCHARGE:                                 OPERATIVE REPORT   PROCEDURE:  Colonoscopy.   MEDICATIONS:  1.  Fentanyl 75 mcg.  2.  Versed 7.5 mg IV.   INDICATIONS:  Cancer screening.   DESCRIPTION OF PROCEDURE:  The procedure had been explained to the patient  and consent obtained.  With the patient  in the left lateral decubitus  position, the Olympus scope was inserted and advanced.  The prep was  excellent.  We were able to reach the cecum without difficulty.  The  ileocecal valve and appendiceal orifice were seen.  The scope was withdrawn,  and the cecum, ascending colon, transverse colon, splenic flexure,  descending, and sigmoid colon were seen well.  No polyps or other lesions  were seen.  There is no significant diverticular disease.  The scope was  withdrawn.  The patient tolerated the procedure well.   ASSESSMENT:  Normal screening colonoscopy.  V76.51.   PLAN:  Recommend yearly Hemoccults, and repeat procedure on an as-needed  basis.       JLE/MEDQ  D:  01/13/2004  T:  01/15/2004  Job:  [id number]   cc:   Reather Littler, M.D.  [address], [address]  [place]  [place] [address]  Fax: [phone]   Llana Aliment. Malon Kindle., M.D.  [address], [address]  [place]  [place] [address]  Fax: [phone]

## 2010-07-27 NOTE — Op Note (Signed)
NAME:  DA, AUTHEMENT NO.:  [id number]   MEDICAL RECORD NO.:  [id number]                    PATIENT TYPE:   LOCATION:                                       FACILITY:   PHYSICIAN:  Currie Paris, M.D.           DATE OF BIRTH:   DATE OF PROCEDURE:  10/14/2001  DATE OF DISCHARGE:                                 OPERATIVE REPORT   OFFICE MEDICAL RECORD NUMBER:  [id number]   PREOPERATIVE DIAGNOSIS:  Large right inguinal hernia.   POSTOPERATIVE DIAGNOSIS:  Large right inguinal hernia.   OPERATION:  Repair of right inguinal hernia with mesh.   SURGEON:  Currie Paris, M.D.   ANESTHESIA:  General.   CLINICAL HISTORY:  This patient is a 77 year old with a long-term history of  right inguinal hernia which is gradually getting larger and larger.  He  elected at this point to have it repaired.   DESCRIPTION OF PROCEDURE:  The patient was seen in the holding area and had  no further questions.  The right inguinal area was identified as the  operative site and marked.   The patient was taken to the operating room, and after satisfactory general  endotracheal anesthesia had been obtained, the groin area was shaved,  prepped and draped.  I used a combination of 1% Xylocaine and 0.5% Marcaine  with epinephrine mixed equally and infiltrated along the skin line and below  the fascia at the anterior superior iliac spine.  An incision was made and  deepened to the external oblique with bleeders electrocoagulated or tied.  The external oblique was infiltrated around that area and then opened along  the line of its fibers.  There was a large direct hernia present which was  stuck to the undersurface of the cord, and I had to dissect the cord up off  the floor, surround it with a Penrose drain and then dissected the hernia  sac free from the cord.  I was able to reduce it nicely, but basically the  entire floor was involved.  There was no indirect sac  that I could locate.   The extra large mesh plug was placed into the defect and held in place with  several sutures of 2-0 Prolene tacking it medially toward the pubic tubercle  and then along the transversalis inferiorly and then medially.  The mesh  patch was then onlaid and sutured with a running suture of 2-0 Prolene  starting medially and working laterally along the inferior edge and then  tacking it to the internal oblique well over  away from the repair.  The  tails were crossed laterally to go laterally and reconstructed the deep  ring.   Everything appeared to be dry.  I infiltrated additional local into the  muscle and fascial areas to help with postoperative pain.  The external  oblique was closed with 3-0  Vicryl over the repair, Scarpa's with 3-0 Vicryl  and the skin with 4-0 Monocryl subcuticular plus Steri-Strips.   The patient tolerated the procedure well.  There were no operative  complications.  All counts were correct.                                               Currie Paris, M.D.    CJS/MEDQ  D:  10/14/2001  T:  10/18/2001  Job:  [id number]   cc:   Reather Littler, M.D.

## 2010-07-27 NOTE — Assessment & Plan Note (Signed)
[facility]                               PULMONARY OFFICE NOTE   NAME:Rettig, MORTON SIMSON                          MRN:          [id number]  DATE:10/10/2005                            DOB:          Jun 27, 1932    CONSULTATION   DATE OF CONSULTATION:  October 10, 2005.   HISTORY OF PRESENT ILLNESS:  The patient is a 77 year old gentleman, who I  have been asked to see for possible sleep apnea.  The patient states that he  has been told he has loud snoring and pauses in his breathing during sleep.  He will physically get to bed at 10 p.m. and get up at 8:30 a.m. to start  his day.  He is not rested upon arising.  He does admit to getting up four  to five times a night for various reasons.  The patient states he has no  energy during the day and significant sleepiness with periods of inactivity.  He has no problems, however, with watching TV in the evenings or with  driving.  Of note, his weight is up about six pounds over the last two  years.   PAST MEDICAL HISTORY:  1.  History of hypertension.  2.  History of asthma.  3.  History of diabetes.  4.  History of dyslipidemia.  5.  History of prostate cancer with prior surgery.  6.  History of multiple back surgeries.   MEDICATIONS:  1.  Benicar 40/25 daily.  2.  Insulin in varying doses.   The patient is ALLERGIC TO PENICILLIN.   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   FAMILY HISTORY:  Remarkable for his brother and mother having had heart  disease, and a sister having had breast and stomach cancer.   REVIEW OF SYSTEMS:  As per History of Present Illness, also see patient's  intake form documented in the chart.   PHYSICAL EXAMINATION:  GENERAL:  He is an overweight white male in no acute  distress.  VITAL SIGNS:  Blood pressure is 108/56, pulse 84, temperature is 98.1,  weight is 208 pounds, he is 5  foot 11 inches tall, O2 saturation on room air  is 95%.  HEENT:  Pupils are equal, round, and reactive to light and accommodation.  Extraocular muscles are intact.  The nares showed septal deviation to the  left with the left side being constructed, the right is patent.  Oropharynx  shows a beefy and long uvula with elongation of the soft palate.  NECK:  Supple without JVD or lymphadenopathy, there is no palpable  thyromegaly.  CHEST:  Totally clear.  CARDIAC:  Regular rate and rhythm.  No murmurs, rubs, or gallops.  ABDOMEN:  Soft and nontender with normoactive bowel sounds.  GENITALIA, BREASTS, RECTAL:  Examinations were not done and not indicated.  LOWER EXTREMITIES:  Without edema, pulses are intact distally.  NEUROLOGIC:  He is alert with no obvious observable motor defects.  IMPRESSION:  Probable obstructive sleep apnea of unknown severity.  I really  think given the patient's various medical issues and his symptoms that he  would benefit from nocturnal polysomnography.  I had a long discussion with  him about the pathophysiology of sleep apnea and answered all of his  questions.   PLAN:  1.  Will schedule a nocturnal polysomnography.  2.  I have asked the patient to work on weight loss.  3.  He will follow up after his sleep study.                                   Barbaraann Share, MD, FCCP   [facility]/MedQ  DD:  10/13/2005  DT:  10/13/2005  Job #:  [id number]

## 2010-07-27 NOTE — Op Note (Signed)
NAME:  Barry Mcdaniel, Barry Mcdaniel                   ACCOUNT NO.:  [id number]   MEDICAL RECORD NO.:  [id number]          PATIENT TYPE:  AMB   LOCATION:  DAY                          FACILITY:  [facility]   PHYSICIAN:  Sigmund I. Patsi Sears, M.D.DATE OF BIRTH:  01-Jan-1933   DATE OF PROCEDURE:  04/12/2005  DATE OF DISCHARGE:                                 OPERATIVE REPORT   PREOP DIAGNOSIS:  T2c adenocarcinoma of the prostate.   POSTOP DIAGNOSIS:  T2c adenocarcinoma of the prostate.   OPERATIONS:  Cryotherapy of the prostate.   SURGEON:  Sigmund I. Patsi Sears, M.D.   ANESTHESIA:  General LMA.   PREPARATION:  After appropriate preanesthesia, the patient was brought to  the operating room, placed on the operating table in the dorsal supine  position where general LMA anesthesia was induced. The patient was then  placed in the dorsal lithotomy position with the pubis was prepped with  Betadine solution and draped in the usual fashion.   REVIEW OF HISTORY:  This 77 year old male has history of Gleason 6  adenocarcinoma of the prostate, in both right-and-left side of the prostate,  as well as atypical tissue and PIN. He had negative bone scan, negative CT  scan; and is, now, for cryosurgery of the prostate as primary monotherapy.   PROCEDURE:  With the patient in the lithotomy position, cystourethroscopy  was accomplished, and showed a normal-appearing urethra, normal-appearing  bladder with mild trabeculation but no cellules , no stones, and no tumors.  The cryoprobes were then placed in three rows, on five channels. Repeat  cystoscopy was accomplished and showed no probes within the urethra or the  bladder. Thermosensor probes were also placed.   A urethral warming device was placed without difficulty.  The patient then  underwent cryotherapy with two freeze/thaw cycles, with excellent ice ball  formation. The patient had complete ablation of his prostate. Following the  second thaw, the probes were  removed. The urethral warming device was left  in place for 21 minutes. The patient tolerated the procedure well, he will  be placed on Percocet postoperative for pain.      Sigmund I. Patsi Sears, M.D.  Electronically Signed     SIT/MEDQ  D:  04/12/2005  T:  04/12/2005  Job:  [id number]

## 2010-09-04 ENCOUNTER — Other Ambulatory Visit: Payer: Self-pay | Admitting: Urology

## 2010-09-04 ENCOUNTER — Encounter (HOSPITAL_COMMUNITY): Payer: Medicare Other

## 2010-09-04 ENCOUNTER — Other Ambulatory Visit (HOSPITAL_COMMUNITY): Payer: Self-pay | Admitting: Urology

## 2010-09-04 ENCOUNTER — Ambulatory Visit (HOSPITAL_COMMUNITY)
Admission: RE | Admit: 2010-09-04 | Discharge: 2010-09-04 | Disposition: A | Discharge status: Home / Self Care | Payer: Medicare Other | Source: Ambulatory Visit | Attending: Urology | Admitting: Urology

## 2010-09-04 DIAGNOSIS — C61 Malignant neoplasm of prostate: Secondary | ICD-10-CM

## 2010-09-04 DIAGNOSIS — I10 Essential (primary) hypertension: Secondary | ICD-10-CM

## 2010-09-04 DIAGNOSIS — R05 Cough: Secondary | ICD-10-CM | POA: Insufficient documentation

## 2010-09-04 DIAGNOSIS — R059 Cough, unspecified: Secondary | ICD-10-CM | POA: Insufficient documentation

## 2010-09-04 DIAGNOSIS — Z01812 Encounter for preprocedural laboratory examination: Secondary | ICD-10-CM | POA: Insufficient documentation

## 2010-09-04 DIAGNOSIS — Z01818 Encounter for other preprocedural examination: Secondary | ICD-10-CM | POA: Insufficient documentation

## 2010-09-04 LAB — CBC
HCT: 38.8 % — ABNORMAL LOW (ref 39.0–52.0)
Hemoglobin: 12.3 g/dL — ABNORMAL LOW (ref 13.0–17.0)
MCH: 28.2 pg (ref 26.0–34.0)
MCHC: 31.7 g/dL (ref 30.0–36.0)
MCV: 89 fL (ref 78.0–100.0)
RDW: 14.4 % (ref 11.5–15.5)

## 2010-09-04 LAB — BASIC METABOLIC PANEL
BUN: 28 mg/dL — ABNORMAL HIGH (ref 6–23)
Calcium: 9.7 mg/dL (ref 8.4–10.5)
Creatinine, Ser: 1.59 mg/dL — ABNORMAL HIGH (ref 0.50–1.35)
GFR calc Af Amer: 52 mL/min — ABNORMAL LOW (ref >60)
GFR calc non Af Amer: 43 mL/min — ABNORMAL LOW (ref >60)
Glucose, Bld: 123 mg/dL — ABNORMAL HIGH (ref 70–99)
Potassium: 4.8 mEq/L (ref 3.5–5.1)

## 2010-09-17 ENCOUNTER — Ambulatory Visit (HOSPITAL_COMMUNITY)
Admission: RE | Admit: 2010-09-17 | Discharge: 2010-09-17 | Disposition: A | Discharge status: Home / Self Care | Payer: Medicare Other | Source: Ambulatory Visit | Attending: Urology | Admitting: Urology

## 2010-09-17 DIAGNOSIS — E119 Type 2 diabetes mellitus without complications: Secondary | ICD-10-CM | POA: Insufficient documentation

## 2010-09-17 DIAGNOSIS — I252 Old myocardial infarction: Secondary | ICD-10-CM | POA: Insufficient documentation

## 2010-09-17 DIAGNOSIS — C61 Malignant neoplasm of prostate: Secondary | ICD-10-CM | POA: Insufficient documentation

## 2010-09-17 DIAGNOSIS — I2510 Atherosclerotic heart disease of native coronary artery without angina pectoris: Secondary | ICD-10-CM | POA: Insufficient documentation

## 2010-09-17 DIAGNOSIS — Z01812 Encounter for preprocedural laboratory examination: Secondary | ICD-10-CM | POA: Insufficient documentation

## 2010-09-17 DIAGNOSIS — I10 Essential (primary) hypertension: Secondary | ICD-10-CM | POA: Insufficient documentation

## 2010-09-17 LAB — GLUCOSE, CAPILLARY: Glucose-Capillary: 147 mg/dL — ABNORMAL HIGH (ref 70–99)

## 2010-09-18 ENCOUNTER — Encounter: Payer: Self-pay | Admitting: Internal Medicine

## 2010-09-18 ENCOUNTER — Emergency Department (HOSPITAL_COMMUNITY)
Admission: EM | Admit: 2010-09-18 | Discharge: 2010-09-18 | Disposition: A | Discharge status: Home / Self Care | Payer: Medicare Other | Attending: Emergency Medicine | Admitting: Emergency Medicine

## 2010-09-18 ENCOUNTER — Emergency Department (HOSPITAL_COMMUNITY): Payer: Medicare Other

## 2010-09-18 DIAGNOSIS — I959 Hypotension, unspecified: Secondary | ICD-10-CM | POA: Insufficient documentation

## 2010-09-18 DIAGNOSIS — E7800 Pure hypercholesterolemia, unspecified: Secondary | ICD-10-CM | POA: Insufficient documentation

## 2010-09-18 DIAGNOSIS — Z7982 Long term (current) use of aspirin: Secondary | ICD-10-CM | POA: Insufficient documentation

## 2010-09-18 DIAGNOSIS — E119 Type 2 diabetes mellitus without complications: Secondary | ICD-10-CM | POA: Insufficient documentation

## 2010-09-18 DIAGNOSIS — Z79899 Other long term (current) drug therapy: Secondary | ICD-10-CM | POA: Insufficient documentation

## 2010-09-18 DIAGNOSIS — E039 Hypothyroidism, unspecified: Secondary | ICD-10-CM | POA: Insufficient documentation

## 2010-09-18 DIAGNOSIS — R05 Cough: Secondary | ICD-10-CM | POA: Insufficient documentation

## 2010-09-18 DIAGNOSIS — Z794 Long term (current) use of insulin: Secondary | ICD-10-CM | POA: Insufficient documentation

## 2010-09-18 DIAGNOSIS — R0989 Other specified symptoms and signs involving the circulatory and respiratory systems: Secondary | ICD-10-CM | POA: Insufficient documentation

## 2010-09-18 DIAGNOSIS — I10 Essential (primary) hypertension: Secondary | ICD-10-CM | POA: Insufficient documentation

## 2010-09-18 DIAGNOSIS — K219 Gastro-esophageal reflux disease without esophagitis: Secondary | ICD-10-CM | POA: Insufficient documentation

## 2010-09-18 DIAGNOSIS — R059 Cough, unspecified: Secondary | ICD-10-CM | POA: Insufficient documentation

## 2010-09-18 LAB — URINALYSIS, ROUTINE W REFLEX MICROSCOPIC
Nitrite: NEGATIVE
Specific Gravity, Urine: 1.023 (ref 1.005–1.030)
Urobilinogen, UA: 0.2 mg/dL (ref 0.0–1.0)

## 2010-09-18 LAB — COMPREHENSIVE METABOLIC PANEL
BUN: 24 mg/dL — ABNORMAL HIGH (ref 6–23)
CO2: 27 mEq/L (ref 19–32)
Chloride: 100 mEq/L (ref 96–112)
Creatinine, Ser: 2.01 mg/dL — ABNORMAL HIGH (ref 0.50–1.35)
GFR calc Af Amer: 39 mL/min — ABNORMAL LOW (ref >60)
GFR calc non Af Amer: 33 mL/min — ABNORMAL LOW (ref >60)
Total Bilirubin: 0.5 mg/dL (ref 0.3–1.2)

## 2010-09-18 LAB — CBC
HCT: 35.3 % — ABNORMAL LOW (ref 39.0–52.0)
MCV: 88.5 fL (ref 78.0–100.0)
RBC: 3.99 MIL/uL — ABNORMAL LOW (ref 4.22–5.81)
WBC: 10.3 10*3/uL (ref 4.0–10.5)

## 2010-09-18 LAB — PROCALCITONIN: Procalcitonin: <0.1 ng/mL

## 2010-09-18 LAB — DIFFERENTIAL
Eosinophils Relative: 2 % (ref 0–5)
Lymphocytes Relative: 11 % — ABNORMAL LOW (ref 12–46)
Lymphs Abs: 1.1 10*3/uL (ref 0.7–4.0)
Monocytes Relative: 8 % (ref 3–12)
Neutrophils Relative %: 79 % — ABNORMAL HIGH (ref 43–77)

## 2010-09-18 LAB — URINE MICROSCOPIC-ADD ON

## 2010-09-18 LAB — GLUCOSE, CAPILLARY: Glucose-Capillary: 194 mg/dL — ABNORMAL HIGH (ref 70–99)

## 2010-09-18 LAB — LACTIC ACID, PLASMA: Lactic Acid, Venous: 1.6 mmol/L (ref 0.5–2.2)

## 2010-09-18 NOTE — H&P (Signed)
Internal Medicine Consult Note Date: 09/18/2010  Patient name: Barry Mcdaniel Medical record number: [id number] Date of birth: 1933-01-24 Age: 77 y.o. Gender: male PCP: Dr. Drinda Butts, Deboraha Sprang Urology: Dr. Page Spiro   Chief Complaint: Low blood pressure  History of Present Illness:  Barry Mcdaniel is a 77 year old gentleman with past medical history significant for TURP on 09/17/2010 for recurrent prostate cancer. He presents to the emergency room with a chief complaint of low blood pressure. He states that he checks his CBG 5 times per day and often checks his blood pressure simultaneously. Upon checking his blood pressure last night, he noted a systolic blood pressure of 87. He states he felt fine but his wife was very concerned about him and urged him to come to the emergency room. He states that his blood pressure ranges from 108-147/67 with an average of approximately 130.  He takes lisinopril daily for hypertension but is unsure of the dose. He denies any changes to his antihypertensive regimen or to any of his other medications with the exception of oxycodone prescribed following his TURP yesterday. He states he took a total of 3 pills yesterday and he believes his low blood pressure as a result of this medication. He denies syncope, dizziness, chest pain, shortness of breath, dyspnea on exertion, headache, visual changes or neurological changes, fever, chills, or cough.     Allergies: Penicillins  Home meds: The patient is unable to recall any of his home meds with the exception of lisinopril which he takes daily for blood pressure and his recently prescribed oxycodone for control of his post operative pain   Past medical hx: Pulmonary embolism, 10/2008     - He has completed a six-month course of Coumadin Asthma Diabetes mellitus.  Gastroesophageal reflux disease.  Hypertension.  Hypothyroidism.  Hyperlipidemia. OSA   Social Hx: He is married and lives with his wife. The patient does not  smoke, use illicit drugs, or currently drink.  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  Family hx:  He has 12 siblings. He states that 7 of them are deceased as a result of coronary artery disease, all in old age.  Review of Systems: Pertinent items are noted in HPI.  Physical Exam:  Vitals: T: 99.9, HR: 89, BP: 86/46>>>106/57, RR: 18,  O2: 100% on 2L [place]  GEN: No apparent distress.  Alert and oriented x 3.  Pleasant, conversant, and cooperative to exam. HEENT: head is autraumatic and normocephalic.  Neck is supple without palpable masses or lymphadenopathy.  No JVD or carotid bruits.  Vision intact.  EOMI.  PERRLA.  Sclerae anicteric.  Conjunctivae without pallor or injection. Mucous membranes are moist.  Oropharynx is without erythema, exudates, or other abnormal lesions.  Patient is edentulous. RESP:  Lungs are clear to ascultation bilaterally with good air movement.  No wheezes, ronchi, or rubs.  Mild crackles noted in bilateral bases, left greater than right. CARDIOVASCULAR: regular rate, normal rhythm.   Distant but clear S1, S2, no murmurs, gallops, or rubs. ABDOMEN: soft, non-tender, non-distended.  Bowels sounds present in all quadrants and normoactive.  No palpable masses. GU: Foley catheter in place; urine appears clear and slightly darker yellow-orange in color.  There is no purulent drainage noted the Foley site. EXT: warm and dry.  Peripheral pulses equal, intact, and +2 globally.  No clubbing or cyanosis.  No edema in bilateral lower extremities. SKIN: warm and dry with  normal turgor.  No rashes or abnormal lesions observed. NEURO: CN II-XII grossly intact.  Muscle strength +5/5 in bilateral upper and lower extremities.  Sensation is grossly intact.  No focal deficit.   Lab results: WBC                                      10.3              4.0-10.5         K/uL  RBC                                      3.99       l       4.22-5.81        MIL/uL  Hemoglobin (HGB)                         11.4       l      13.0-17.0        g/dL  Hematocrit (HCT)                         35.3       l      39.0-52.0        %  MCV                                      88.5              78.0-100.0       fL  MCH -                                    28.6              26.0-34.0        pg  MCHC                                     32.3              30.0-36.0        g/dL  RDW                                      14.5              11.5-15.5        %  Platelet Count (PLT)                     151               150-400          K/uL  Neutrophils, %                           79         h      43-77            %  Lymphocytes, %                           11         l      12-46            %  Monocytes, %                             8                 3-12             %  Eosinophils, %                           2                 0-5              %  Basophils, %                             0                 0-1              %  Neutrophils, Absolute                    8.2        h      1.7-7.7          K/uL  Lymphocytes, Absolute                    1.1               0.7-4.0          K/uL  Monocytes, Absolute                      0.8               0.1-1.0          K/uL  Eosinophils, Absolute                    0.2               0.0-0.7          K/uL  Basophils, Absolute                      0.0               0.0-0.1          K/uL  Sodium (NA)                              134        l      135-145          mEq/L  Potassium (K)                            4.5               3.5-5.1          mEq/L  Chloride  100               96-112           mEq/L  CO2                                      27                19-32            mEq/L  Glucose                                  225        h      70-99            mg/dL  BUN                                      24         h      6-23             mg/dL  Creatinine                                2.01       h      0.50-1.35        mg/dL    **Please note change in reference range.**  GFR, Est Non African American            33         l      >60              mL/min  GFR, Est African American                39         l      >60              mL/min    Oversized comment, see footnote  1  Bilirubin, Total                         0.5               0.3-1.2          mg/dL  Alkaline Phosphatase                     76                39-117           U/L  SGOT (AST)                               25                0-37             U/L  SGPT (ALT)                               13  0-53             U/L  Total  Protein                           5.6        l      6.0-8.3          g/dL  Albumin-Blood                            2.9        l      3.5-5.2          g/dL  Calcium                                  7.8        l      8.4-10.5         Mg/dL   Procalcitonin                            <0.10                              Ng/mL Lactic Acid, Venous                      1.6               0.5-2.2          Mmol/L  Color, Urine                             RED        a      YELLOW    BIOCHEMICALS MAY BE AFFECTED BY COLOR  Appearance                               TURBID     a      CLEAR  Specific Gravity                         1.023             1.005-1.030  pH                                       5.0               5.0-8.0  Urine Glucose                            NEGATIVE          NEG              mg/dL  Bilirubin                                SMALL      a      NEG  Ketones  NEGATIVE          NEG              mg/dL  Blood                                    LARGE      a      NEG  Protein                                  100        a      NEG              mg/dL  Urobilinogen                             0.2               0.0-1.0          mg/dL  Nitrite                                  NEGATIVE          NEG  Leukocytes                                MODERATE   a      NEG  Squamous Epithelial / LPF                RARE              RARE  Casts / HPF                              SEE NOTE.  a      NEG    GRANULAR CAST    HYALINE CASTS  WBC / HPF                                11-20             <3               WBC/hpf  RBC / HPF                                SEE NOTE.         <3               RBC/hpf    TOO NUMEROUS TO COUNT  Bacteria / HPF                           FEW        a      RARE  Imaging results:  CXR, PA and Lat: NAP   Assessment & Plan by Problem:  Hypertension: Patient was initially hypotensive on arrival to the emergency department. He is asymptomatic and otherwise hemodynamically stable. His history, physical exam, and laboratory results including 12-lead EKG are not concerning for sepsis, myocardial  infarction, or other concerning process.  His blood pressure has significantly improved following the administration of 1.5 L of normal saline. His systolic blood pressures have remained at approximately 120 over the past 2 hours with a MAP greater than 70.  Given the paucity of findings to to suggest a concerning etiology for his hypotension, and his recently prescribed oxycodone, I believe it is most likely that his hypotension is an adverse effect of his oxycodone. He has responded appropriately to administration of IV fluids and expresses a desire to go home.  I do not believe there are any indications for hospital admission at this time. Barry Mcdaniel is advised to discontinue use of his oxycodone and to schedule a followup appointment with his primary care provider in the next 3-7 days. He is advised to return to the emergency room if he develops dizziness, chest pain, shortness of breath, dyspnea on exertion, fevers,rigors, persistent hypotension, or other concerning complaint.  He expresses a full understanding and agreement with the plan.

## 2010-09-19 LAB — URINE CULTURE: Culture: NO GROWTH

## 2010-09-20 NOTE — Op Note (Signed)
  NAME:  Barry Mcdaniel, Barry Mcdaniel NO.:  [id number]  MEDICAL RECORD NO.:  [id number]  LOCATION:  DAYL                         FACILITY:  [facility]  PHYSICIAN:  Anglea Gordner I. Patsi Sears, M.D.DATE OF BIRTH:  December 14, 1932  DATE OF PROCEDURE:  09/17/2010 DATE OF DISCHARGE:                              OPERATIVE REPORT   PREOPERATIVE DIAGNOSIS:  Recurrent cancer of the prostate.  POSTOPERATIVE DIAGNOSIS:  Recurrent cancer of the prostate.  OPERATION:  Cryotherapy of the prostate.  SURGEON:  Demari Kropp I. Patsi Sears, M.D.  ANESTHESIA:  General LMA.  PREPARATION:  After appropriate preanesthesia, the patient was brought to the operating room, placed on the operating room table in dorsal supine position where general LMA anesthesia was introduced.  He was then replaced in dorsal lithotomy position where the pubis was prepped with Betadine solution and draped in the usual fashion.  REVIEW OF HISTORY:  The patient is a 77 year old male, status post cryosurgery for T1c prostate cancer in 2008, now with recurrent biopsy- proven prostate cancer, negative CT and bone scan, with bladder-outlet obstructive symptoms.  He is now for repeat cryotherapy.  PROCEDURE:  With the patient in the dorsal lithotomy position, the pubis and the perineum were prepped with Betadine solution and draped in usual fashion.  The scrotum was draped out of the way, and the cryotherapy ultrasound unit was placed transrectally.  The prostate measured 18 cm in size.  Following this, cryotherapy needles were placed in 4 different rows, and also thermocouple device was placed in the Denonvilliers fascia and the external sphincter.  Cystoscopy was accomplished and showed no needles within the urethra.  Guidewire was placed in the bladder and a warming catheter was placed over the guidewire without difficulty.  The warming device was kept in place for 20 minutes after the final freeze.  Foley catheter was placed at the  end of procedure. The patient did undergo 2 free thaws, with the first as an active thaw and the second as a passive thaw.  The patient tolerated the procedure well.  He was given IV acetaminophen and IV Toradol at the end of the case.  He tolerated the procedure well, was awakened and taken to the recovery room with the Foley catheter in place in good condition.     [name] I. Patsi Sears, M.D.    SIT/MEDQ  D:  09/17/2010  T:  09/17/2010  Job:  [id number]  Electronically Signed by Jethro Bolus M.D. on 09/20/2010 08:38:53 AM

## 2010-09-24 LAB — CULTURE, BLOOD (ROUTINE X 2): Culture  Setup Time: 201207101240

## 2010-10-10 NOTE — Progress Notes (Signed)
I examined Barry Mcdaniel at the time of the ED visit with Dr. Arvilla Market.  I agree with her history and physical examination.  We formulated the assessment and plan together.

## 2011-03-01 ENCOUNTER — Encounter (HOSPITAL_COMMUNITY): Payer: Self-pay

## 2011-03-11 NOTE — H&P (Signed)
  Markeem D. Horwitz SR DOB: Jul 28, 1932  Chief Complaint: Right Shoulder pain   The patient is a 77 year old male who presents today for follow up of their shoulder. The patient is being followed for their right shoulder pain. Symptoms reported today include: pain. The patient feels that they are doing poorly (much worse over the last 3 weeks). The patient has reported improvement of their symptoms with: Cortisone injections. He presents today with MRI results. After last visit and injection, he was going out doing some heavy work with the boat and had increased pain in his right shoulder.  Problem List/Past Medical Impingement Syndrome (726.2) Degeneration, cervical disc (722.4). 11/10/1997 Syndrome, rotator cuff NOS (726.10). 06/03/2000 Pain in joint, shoulder (719.41). 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 12/30/2002 Tear, medial meniscus, knee, current (836.0). 01/25/2003 Tear, lateral meniscus, knee, current (836.1). 01/25/2003 Osteoarthrosis NOS, lower leg (715.96). 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 01/25/2003 Tear, medial meniscus, knee, current (836.0). 02/07/2003 Osteoarthrosis NOS, lower leg (715.96). 07/31/2004 Lumbago (724.2). 12/04/2005 BURSITIS TROCHANTERIC AREA. 12/04/2005 BURSITIS TROCHANTERIC AREA. 12/27/2005 Osteoarthrosis NOS, forearm (715.93). 04/02/2010   Allergies ALCOHOL PENICILLIN. 12/31/2002 MOBIC 7.5MG . 06/19/2010 Stomach Ache, Numbness.   Family History Cerebrovascular Accident. father Cancer. sister Diabetes Mellitus. father, sister and brother Drug / Alcohol Addiction. brother Heart disease in male family member before age 84 Rheumatoid Arthritis. mother  Social History Drug/Alcohol Rehab (Currently). no Exercise. Exercises daily; does running / walking Alcohol use. former drinker Illicit drug use. no Tobacco / smoke exposure. no Copy of Drug/Alcohol Rehab (Previously). yes Current work status.  retired Marital status. married Children. 0 Tobacco use. never smoker Living situation. live with spouse  Past Surgical History Arthroscopy of Knee. right Prostatectomy; Transurethral Spinal Fusion. lower back Spinal Surgery  Other Problems Cancer Asthma Chronic Pain Diabetes Mellitus, Type II Gastroesophageal Reflux Disease Gout Hypercholesterolemia Peripheral Neuropathy Myocardial infarction High blood pressure Prostate Cancer Prostate Disease Rheumatoid Arthritis Kidney Stone  Objective  Alert and oriented x 3. No acute distress. Lungs clear to auscultation. Regular rate, rhythm. Heart sound normal. No murmurs. Abdomen soft and nontender. Bowel sounds active. Cranial nerves grossly intact. He has painful limited motion of the right shoulder. His motion is limited now in all planes. Sensation intact in upper extremities. Radial pulses 2+ bilaterally. Strength 3/5 on right, 5/5 on left. No masses or tumors about the shoulder.  MRI reveals right torn rotator cuff.     PROCEDURE: Reinjected him today with 2 cc of Depo Medrol, 5 cc of 0.50 percent Marcaine.   Plans  Patient received surgical clearance from Dr. Lucianne Muss. He will call us and give [place] a notice and we will do an open acromionectomy of the right shoulder to repair the right rotator cuff. We will wait on his total knee until we get his shoulder fixed. He was informed of risks and possible complications of surgery.

## 2011-03-13 DIAGNOSIS — N529 Male erectile dysfunction, unspecified: Secondary | ICD-10-CM | POA: Diagnosis not present

## 2011-03-13 DIAGNOSIS — Z8546 Personal history of malignant neoplasm of prostate: Secondary | ICD-10-CM | POA: Diagnosis not present

## 2011-03-13 DIAGNOSIS — R32 Unspecified urinary incontinence: Secondary | ICD-10-CM | POA: Diagnosis not present

## 2011-03-14 ENCOUNTER — Encounter (HOSPITAL_COMMUNITY): Payer: Self-pay

## 2011-03-14 ENCOUNTER — Encounter (HOSPITAL_COMMUNITY)
Admission: RE | Admit: 2011-03-14 | Discharge: 2011-03-14 | Disposition: A | Discharge status: Home / Self Care | Payer: Medicare Other | Source: Ambulatory Visit | Attending: Orthopedic Surgery | Admitting: Orthopedic Surgery

## 2011-03-14 DIAGNOSIS — M069 Rheumatoid arthritis, unspecified: Secondary | ICD-10-CM | POA: Diagnosis not present

## 2011-03-14 DIAGNOSIS — K219 Gastro-esophageal reflux disease without esophagitis: Secondary | ICD-10-CM | POA: Diagnosis not present

## 2011-03-14 DIAGNOSIS — J45909 Unspecified asthma, uncomplicated: Secondary | ICD-10-CM | POA: Diagnosis not present

## 2011-03-14 DIAGNOSIS — Z01812 Encounter for preprocedural laboratory examination: Secondary | ICD-10-CM | POA: Diagnosis not present

## 2011-03-14 DIAGNOSIS — G609 Hereditary and idiopathic neuropathy, unspecified: Secondary | ICD-10-CM | POA: Diagnosis not present

## 2011-03-14 DIAGNOSIS — E039 Hypothyroidism, unspecified: Secondary | ICD-10-CM | POA: Diagnosis not present

## 2011-03-14 DIAGNOSIS — C61 Malignant neoplasm of prostate: Secondary | ICD-10-CM | POA: Diagnosis not present

## 2011-03-14 DIAGNOSIS — I10 Essential (primary) hypertension: Secondary | ICD-10-CM | POA: Diagnosis not present

## 2011-03-14 DIAGNOSIS — I252 Old myocardial infarction: Secondary | ICD-10-CM | POA: Diagnosis not present

## 2011-03-14 DIAGNOSIS — E7800 Pure hypercholesterolemia, unspecified: Secondary | ICD-10-CM | POA: Diagnosis not present

## 2011-03-14 DIAGNOSIS — M67919 Unspecified disorder of synovium and tendon, unspecified shoulder: Secondary | ICD-10-CM | POA: Diagnosis not present

## 2011-03-14 DIAGNOSIS — E119 Type 2 diabetes mellitus without complications: Secondary | ICD-10-CM | POA: Diagnosis not present

## 2011-03-14 DIAGNOSIS — M25819 Other specified joint disorders, unspecified shoulder: Secondary | ICD-10-CM | POA: Diagnosis not present

## 2011-03-14 HISTORY — DX: Unspecified osteoarthritis, unspecified site: M19.90

## 2011-03-14 HISTORY — DX: Gastro-esophageal reflux disease without esophagitis: K21.9

## 2011-03-14 HISTORY — DX: Cardiac murmur, unspecified: R01.1

## 2011-03-14 HISTORY — DX: Peripheral vascular disease, unspecified: I73.9

## 2011-03-14 HISTORY — DX: Hypothyroidism, unspecified: E03.9

## 2011-03-14 HISTORY — DX: Anemia, unspecified: D64.9

## 2011-03-14 LAB — DIFFERENTIAL
Basophils Absolute: 0 10*3/uL (ref 0.0–0.1)
Basophils Relative: 0 % (ref 0–1)
Eosinophils Absolute: 0.2 10*3/uL (ref 0.0–0.7)
Eosinophils Relative: 3 % (ref 0–5)
Lymphocytes Relative: 17 % (ref 12–46)
Lymphs Abs: 1.2 10*3/uL (ref 0.7–4.0)
Monocytes Absolute: 0.5 10*3/uL (ref 0.1–1.0)
Monocytes Relative: 7 % (ref 3–12)
Neutro Abs: 5.1 10*3/uL (ref 1.7–7.7)
Neutrophils Relative %: 73 % (ref 43–77)

## 2011-03-14 LAB — URINALYSIS, ROUTINE W REFLEX MICROSCOPIC
Glucose, UA: NEGATIVE mg/dL
Hgb urine dipstick: NEGATIVE
Nitrite: NEGATIVE
Protein, ur: NEGATIVE mg/dL
Specific Gravity, Urine: 1.031 — ABNORMAL HIGH (ref 1.005–1.030)
Urobilinogen, UA: 1 mg/dL (ref 0.0–1.0)
pH: 5.5 (ref 5.0–8.0)

## 2011-03-14 LAB — URINE MICROSCOPIC-ADD ON

## 2011-03-14 LAB — PROTIME-INR
INR: 0.95 (ref 0.00–1.49)
Prothrombin Time: 12.9 seconds (ref 11.6–15.2)

## 2011-03-14 LAB — CBC
HCT: 40.5 % (ref 39.0–52.0)
Hemoglobin: 13.1 g/dL (ref 13.0–17.0)
MCH: 28.4 pg (ref 26.0–34.0)
MCHC: 32.3 g/dL (ref 30.0–36.0)
MCV: 87.7 fL (ref 78.0–100.0)
Platelets: 167 10*3/uL (ref 150–400)
RBC: 4.62 MIL/uL (ref 4.22–5.81)
RDW: 16.4 % — ABNORMAL HIGH (ref 11.5–15.5)
WBC: 7 10*3/uL (ref 4.0–10.5)

## 2011-03-14 LAB — COMPREHENSIVE METABOLIC PANEL
ALT: 15 U/L (ref 0–53)
AST: 15 U/L (ref 0–37)
Albumin: 3.4 g/dL — ABNORMAL LOW (ref 3.5–5.2)
Alkaline Phosphatase: 76 U/L (ref 39–117)
BUN: 17 mg/dL (ref 6–23)
CO2: 30 mEq/L (ref 19–32)
Calcium: 9.3 mg/dL (ref 8.4–10.5)
Chloride: 105 mEq/L (ref 96–112)
Creatinine, Ser: 1.14 mg/dL (ref 0.50–1.35)
GFR calc Af Amer: 70 mL/min — ABNORMAL LOW (ref >90)
GFR calc non Af Amer: 61 mL/min — ABNORMAL LOW (ref >90)
Glucose, Bld: 63 mg/dL — ABNORMAL LOW (ref 70–99)
Potassium: 3.9 mEq/L (ref 3.5–5.1)
Sodium: 140 mEq/L (ref 135–145)
Total Bilirubin: 0.3 mg/dL (ref 0.3–1.2)
Total Protein: 6.8 g/dL (ref 6.0–8.3)

## 2011-03-14 LAB — APTT: aPTT: 27 seconds (ref 24–37)

## 2011-03-14 LAB — SURGICAL PCR SCREEN: Staphylococcus aureus: NEGATIVE

## 2011-03-14 NOTE — Pre-Procedure Instructions (Signed)
03/14/11 Pt states not followed by a  Cardiologist at preop appt.

## 2011-03-14 NOTE — Pre-Procedure Instructions (Signed)
03/14/11 Wife called back and stated pt took Synthroid in pm and Proscar in am.   03/14/11 Glucose 63 on labs.  Wife stated pt had eaten since preop appt and was doing fine.

## 2011-03-14 NOTE — Patient Instructions (Signed)
20 KRISTOPH SATTLER  03/14/2011   Your procedure is scheduled on:  03/20/11 1130-1300pm  Report to [facility] at 0930 AM.  Call this number if you have problems the morning of surgery: [phone]   Remember:   Do not eat food:After Midnight.  May have clear liquids:until Midnight .  Clear liquids include soda, tea, black coffee, apple or grape juice, broth.  Take these medicines the morning of surgery with A SIP OF WATER:    Do not wear jewelry,   Do not wear lotions, powders, or perfumes. .    Do not bring valuables to the hospital.  Contacts, dentures or bridgework may not be worn into surgery.  Leave suitcase in the car. After surgery it may be brought to your room.  For patients admitted to the hospital, checkout time is 11:00 AM the day of discharge.      Special Instructions: CHG Shower Use Special Wash: 1/2 bottle night before surgery and 1/2 bottle morning of surgery. Shower chin to toes with CHG.  Wash face and private parts with regular soap.     Please read over the following fact sheets that you were given: MRSA Information, coughing and deep breathing exercises, leg exercises. , Incentive Spirometry Fact Sheet, Blood Transfusion Fact Sheet

## 2011-03-20 ENCOUNTER — Ambulatory Visit (HOSPITAL_COMMUNITY): Payer: Medicare Other | Admitting: Anesthesiology

## 2011-03-20 ENCOUNTER — Encounter (HOSPITAL_COMMUNITY)
Admission: RE | Disposition: A | Discharge status: Home / Self Care | Payer: Self-pay | Source: Ambulatory Visit | Attending: Orthopedic Surgery

## 2011-03-20 ENCOUNTER — Ambulatory Visit (HOSPITAL_COMMUNITY)
Admission: RE | Admit: 2011-03-20 | Discharge: 2011-03-21 | Disposition: A | Discharge status: Home / Self Care | Payer: Medicare Other | Source: Ambulatory Visit | Attending: Orthopedic Surgery | Admitting: Orthopedic Surgery

## 2011-03-20 ENCOUNTER — Encounter (HOSPITAL_COMMUNITY): Payer: Self-pay | Admitting: *Deleted

## 2011-03-20 ENCOUNTER — Encounter (HOSPITAL_COMMUNITY): Payer: Self-pay | Admitting: Anesthesiology

## 2011-03-20 DIAGNOSIS — M069 Rheumatoid arthritis, unspecified: Secondary | ICD-10-CM | POA: Insufficient documentation

## 2011-03-20 DIAGNOSIS — S46819A Strain of other muscles, fascia and tendons at shoulder and upper arm level, unspecified arm, initial encounter: Secondary | ICD-10-CM | POA: Diagnosis not present

## 2011-03-20 DIAGNOSIS — E7800 Pure hypercholesterolemia, unspecified: Secondary | ICD-10-CM | POA: Insufficient documentation

## 2011-03-20 DIAGNOSIS — E119 Type 2 diabetes mellitus without complications: Secondary | ICD-10-CM | POA: Insufficient documentation

## 2011-03-20 DIAGNOSIS — I10 Essential (primary) hypertension: Secondary | ICD-10-CM | POA: Diagnosis not present

## 2011-03-20 DIAGNOSIS — Z01812 Encounter for preprocedural laboratory examination: Secondary | ICD-10-CM | POA: Insufficient documentation

## 2011-03-20 DIAGNOSIS — M67919 Unspecified disorder of synovium and tendon, unspecified shoulder: Secondary | ICD-10-CM | POA: Diagnosis not present

## 2011-03-20 DIAGNOSIS — E039 Hypothyroidism, unspecified: Secondary | ICD-10-CM | POA: Insufficient documentation

## 2011-03-20 DIAGNOSIS — S43499A Other sprain of unspecified shoulder joint, initial encounter: Secondary | ICD-10-CM | POA: Diagnosis not present

## 2011-03-20 DIAGNOSIS — M719 Bursopathy, unspecified: Secondary | ICD-10-CM | POA: Diagnosis not present

## 2011-03-20 DIAGNOSIS — I252 Old myocardial infarction: Secondary | ICD-10-CM | POA: Insufficient documentation

## 2011-03-20 DIAGNOSIS — G609 Hereditary and idiopathic neuropathy, unspecified: Secondary | ICD-10-CM | POA: Insufficient documentation

## 2011-03-20 DIAGNOSIS — K219 Gastro-esophageal reflux disease without esophagitis: Secondary | ICD-10-CM | POA: Insufficient documentation

## 2011-03-20 DIAGNOSIS — I798 Other disorders of arteries, arterioles and capillaries in diseases classified elsewhere: Secondary | ICD-10-CM | POA: Diagnosis not present

## 2011-03-20 DIAGNOSIS — C61 Malignant neoplasm of prostate: Secondary | ICD-10-CM | POA: Insufficient documentation

## 2011-03-20 DIAGNOSIS — J45909 Unspecified asthma, uncomplicated: Secondary | ICD-10-CM | POA: Insufficient documentation

## 2011-03-20 DIAGNOSIS — M25819 Other specified joint disorders, unspecified shoulder: Secondary | ICD-10-CM | POA: Insufficient documentation

## 2011-03-20 DIAGNOSIS — M75100 Unspecified rotator cuff tear or rupture of unspecified shoulder, not specified as traumatic: Secondary | ICD-10-CM

## 2011-03-20 HISTORY — PX: SHOULDER OPEN ROTATOR CUFF REPAIR: SHX2407

## 2011-03-20 LAB — GLUCOSE, CAPILLARY
Glucose-Capillary: 162 mg/dL — ABNORMAL HIGH (ref 70–99)
Glucose-Capillary: 105 mg/dL — ABNORMAL HIGH (ref 70–99)
Glucose-Capillary: 62 mg/dL — ABNORMAL LOW (ref 70–99)
Glucose-Capillary: 116 mg/dL — ABNORMAL HIGH (ref 70–99)

## 2011-03-20 LAB — TYPE AND SCREEN
ABO/RH(D): A POS
Antibody Screen: NEGATIVE

## 2011-03-20 LAB — ABO/RH: ABO/RH(D): A POS

## 2011-03-20 SURGERY — REPAIR, ROTATOR CUFF, OPEN
Anesthesia: General | Site: Shoulder | Laterality: Right | Wound class: Clean

## 2011-03-20 MED ORDER — CELECOXIB 200 MG PO CAPS
200.0000 mg | ORAL_CAPSULE | Freq: Every day | ORAL | Status: DC
  Administered 2011-03-21: 200 mg via ORAL
  Filled 2011-03-20: qty 1

## 2011-03-20 MED ORDER — ACETAMINOPHEN 10 MG/ML IV SOLN
INTRAVENOUS | Status: DC | PRN
  Administered 2011-03-20: 1000 mg via INTRAVENOUS

## 2011-03-20 MED ORDER — METOCLOPRAMIDE HCL 10 MG PO TABS: 5.0000 mg | ORAL_TABLET | Freq: Three times a day (TID) | ORAL | Status: DC | PRN

## 2011-03-20 MED ORDER — METHOCARBAMOL 100 MG/ML IJ SOLN
500.0000 mg | Freq: Four times a day (QID) | INTRAMUSCULAR | Status: DC | PRN
  Filled 2011-03-20: qty 5

## 2011-03-20 MED ORDER — MEPERIDINE HCL 50 MG/ML IJ SOLN: 6.2500 mg | INTRAMUSCULAR | Status: DC | PRN

## 2011-03-20 MED ORDER — FLEET ENEMA 7-19 GM/118ML RE ENEM: 1.0000 | ENEMA | Freq: Once | RECTAL | Status: AC | PRN

## 2011-03-20 MED ORDER — BACITRACIN-NEOMYCIN-POLYMYXIN 400-5-5000 EX OINT
TOPICAL_OINTMENT | CUTANEOUS | Status: AC
  Filled 2011-03-20: qty 1

## 2011-03-20 MED ORDER — CLINDAMYCIN PHOSPHATE 600 MG/50ML IV SOLN
600.0000 mg | INTRAVENOUS | Status: AC
  Administered 2011-03-20: 600 mg via INTRAVENOUS

## 2011-03-20 MED ORDER — HYDROCODONE-ACETAMINOPHEN 5-325 MG PO TABS: 2.0000 | ORAL_TABLET | ORAL | Status: DC | PRN

## 2011-03-20 MED ORDER — CLINDAMYCIN PHOSPHATE 600 MG/50ML IV SOLN
600.0000 mg | Freq: Four times a day (QID) | INTRAVENOUS | Status: AC
  Administered 2011-03-20 – 2011-03-21 (×3): 600 mg via INTRAVENOUS
  Filled 2011-03-20 (×3): qty 50

## 2011-03-20 MED ORDER — GABAPENTIN 300 MG PO CAPS
300.0000 mg | ORAL_CAPSULE | Freq: Four times a day (QID) | ORAL | Status: DC
  Administered 2011-03-20 – 2011-03-21 (×2): 300 mg via ORAL
  Filled 2011-03-20 (×5): qty 1

## 2011-03-20 MED ORDER — LACTATED RINGERS IV SOLN
INTRAVENOUS | Status: DC
  Administered 2011-03-20: 1000 mL via INTRAVENOUS

## 2011-03-20 MED ORDER — SODIUM CHLORIDE 0.9 % IR SOLN
Status: DC | PRN
  Administered 2011-03-20: 13:00:00

## 2011-03-20 MED ORDER — MORPHINE SULFATE 10 MG/ML IJ SOLN
INTRAMUSCULAR | Status: AC
  Administered 2011-03-20: 2 mg
  Filled 2011-03-20: qty 1

## 2011-03-20 MED ORDER — PROPOFOL 10 MG/ML IV EMUL
INTRAVENOUS | Status: DC | PRN
  Administered 2011-03-20: 150 mg via INTRAVENOUS

## 2011-03-20 MED ORDER — SODIUM CHLORIDE 0.9 % IJ SOLN
INTRAMUSCULAR | Status: DC | PRN
  Administered 2011-03-20: 20 mL via INTRAVENOUS

## 2011-03-20 MED ORDER — METHOCARBAMOL 500 MG PO TABS: 500.0000 mg | ORAL_TABLET | Freq: Four times a day (QID) | ORAL | Status: DC | PRN

## 2011-03-20 MED ORDER — FINASTERIDE 5 MG PO TABS
5.0000 mg | ORAL_TABLET | Freq: Every day | ORAL | Status: DC
  Administered 2011-03-21: 5 mg via ORAL
  Filled 2011-03-20: qty 1

## 2011-03-20 MED ORDER — LACTATED RINGERS IV SOLN: INTRAVENOUS | Status: DC

## 2011-03-20 MED ORDER — PIOGLITAZONE HCL 15 MG PO TABS
15.0000 mg | ORAL_TABLET | Freq: Every day | ORAL | Status: DC
  Administered 2011-03-21: 15 mg via ORAL
  Filled 2011-03-20: qty 1

## 2011-03-20 MED ORDER — SODIUM CHLORIDE 0.9 % IV SOLN
INTRAVENOUS | Status: DC
  Administered 2011-03-20 – 2011-03-21 (×2): via INTRAVENOUS

## 2011-03-20 MED ORDER — DEXTROSE 50 % IV SOLN
INTRAVENOUS | Status: AC
  Administered 2011-03-20: 25 mL
  Filled 2011-03-20: qty 50

## 2011-03-20 MED ORDER — POLYETHYLENE GLYCOL 3350 17 G PO PACK
17.0000 g | PACK | Freq: Every day | ORAL | Status: DC | PRN
  Filled 2011-03-20: qty 1

## 2011-03-20 MED ORDER — LIDOCAINE HCL (CARDIAC) 20 MG/ML IV SOLN
INTRAVENOUS | Status: DC | PRN
  Administered 2011-03-20: 100 mg via INTRAVENOUS

## 2011-03-20 MED ORDER — MENTHOL 3 MG MT LOZG: 1.0000 | LOZENGE | OROMUCOSAL | Status: DC | PRN

## 2011-03-20 MED ORDER — EPHEDRINE SULFATE 50 MG/ML IJ SOLN
INTRAMUSCULAR | Status: DC | PRN
  Administered 2011-03-20: 10 mg via INTRAVENOUS

## 2011-03-20 MED ORDER — SODIUM CHLORIDE 0.9 % IV SOLN
INTRAVENOUS | Status: DC | PRN
  Administered 2011-03-20: 11:00:00 via INTRAVENOUS

## 2011-03-20 MED ORDER — SUCCINYLCHOLINE CHLORIDE 20 MG/ML IJ SOLN
INTRAMUSCULAR | Status: DC | PRN
  Administered 2011-03-20: 100 mg via INTRAVENOUS

## 2011-03-20 MED ORDER — CISATRACURIUM BESYLATE 2 MG/ML IV SOLN
INTRAVENOUS | Status: DC | PRN
  Administered 2011-03-20: 5 mg via INTRAVENOUS
  Administered 2011-03-20: 3 mg via INTRAVENOUS

## 2011-03-20 MED ORDER — ACETAMINOPHEN 325 MG PO TABS: 650.0000 mg | ORAL_TABLET | Freq: Four times a day (QID) | ORAL | Status: DC | PRN

## 2011-03-20 MED ORDER — THROMBIN 5000 UNITS EX SOLR
CUTANEOUS | Status: AC
  Filled 2011-03-20: qty 10000

## 2011-03-20 MED ORDER — PHENOL 1.4 % MT LIQD: 1.0000 | OROMUCOSAL | Status: DC | PRN

## 2011-03-20 MED ORDER — HYDROMORPHONE HCL PF 1 MG/ML IJ SOLN
0.5000 mg | INTRAMUSCULAR | Status: DC | PRN
  Administered 2011-03-20 – 2011-03-21 (×5): 1 mg via INTRAVENOUS
  Filled 2011-03-20 (×5): qty 1

## 2011-03-20 MED ORDER — GLYCOPYRROLATE 0.2 MG/ML IJ SOLN
INTRAMUSCULAR | Status: DC | PRN
  Administered 2011-03-20: 0.2 mg via INTRAVENOUS
  Administered 2011-03-20: .4 mg via INTRAVENOUS

## 2011-03-20 MED ORDER — METOCLOPRAMIDE HCL 5 MG/ML IJ SOLN
5.0000 mg | Freq: Three times a day (TID) | INTRAMUSCULAR | Status: DC | PRN
  Filled 2011-03-20: qty 2

## 2011-03-20 MED ORDER — ACETAMINOPHEN 650 MG RE SUPP: 650.0000 mg | Freq: Four times a day (QID) | RECTAL | Status: DC | PRN

## 2011-03-20 MED ORDER — BUPIVACAINE LIPOSOME 1.3 % IJ SUSP
20.0000 mL | INTRAMUSCULAR | Status: AC
  Administered 2011-03-20: 40 mL
  Filled 2011-03-20: qty 20

## 2011-03-20 MED ORDER — MORPHINE SULFATE 10 MG/ML IJ SOLN
1.0000 mg | INTRAMUSCULAR | Status: DC | PRN
  Administered 2011-03-20 (×4): 2 mg via INTRAVENOUS

## 2011-03-20 MED ORDER — ONDANSETRON HCL 4 MG/2ML IJ SOLN: 4.0000 mg | Freq: Four times a day (QID) | INTRAMUSCULAR | Status: DC | PRN

## 2011-03-20 MED ORDER — ONDANSETRON HCL 4 MG/2ML IJ SOLN
INTRAMUSCULAR | Status: DC | PRN
  Administered 2011-03-20 (×2): 2 mg via INTRAVENOUS

## 2011-03-20 MED ORDER — ACETAMINOPHEN 10 MG/ML IV SOLN
1000.0000 mg | Freq: Four times a day (QID) | INTRAVENOUS | Status: DC
  Administered 2011-03-20 – 2011-03-21 (×2): 1000 mg via INTRAVENOUS
  Filled 2011-03-20 (×3): qty 100

## 2011-03-20 MED ORDER — ONDANSETRON HCL 4 MG PO TABS: 4.0000 mg | ORAL_TABLET | Freq: Four times a day (QID) | ORAL | Status: DC | PRN

## 2011-03-20 MED ORDER — ROSUVASTATIN CALCIUM 20 MG PO TABS
20.0000 mg | ORAL_TABLET | Freq: Every day | ORAL | Status: DC
  Filled 2011-03-20: qty 1

## 2011-03-20 MED ORDER — NEOSTIGMINE METHYLSULFATE 1 MG/ML IJ SOLN
INTRAMUSCULAR | Status: DC | PRN
  Administered 2011-03-20: 3 mg via INTRAVENOUS

## 2011-03-20 MED ORDER — FENTANYL CITRATE 0.05 MG/ML IJ SOLN
INTRAMUSCULAR | Status: DC | PRN
  Administered 2011-03-20: 25 ug via INTRAVENOUS
  Administered 2011-03-20: 50 ug via INTRAVENOUS
  Administered 2011-03-20: 100 ug via INTRAVENOUS
  Administered 2011-03-20: 50 ug via INTRAVENOUS
  Administered 2011-03-20: 25 ug via INTRAVENOUS

## 2011-03-20 MED ORDER — INSULIN ASPART 100 UNIT/ML ~~LOC~~ SOLN
0.0000 [IU] | Freq: Three times a day (TID) | SUBCUTANEOUS | Status: DC
  Filled 2011-03-20: qty 3

## 2011-03-20 MED ORDER — OXYCODONE-ACETAMINOPHEN 5-325 MG PO TABS
1.0000 | ORAL_TABLET | ORAL | Status: DC | PRN
  Administered 2011-03-21: 2 via ORAL
  Filled 2011-03-20: qty 2

## 2011-03-20 MED ORDER — PROMETHAZINE HCL 25 MG/ML IJ SOLN: 6.2500 mg | INTRAMUSCULAR | Status: DC | PRN

## 2011-03-20 MED ORDER — MORPHINE SULFATE 2 MG/ML IJ SOLN
INTRAMUSCULAR | Status: AC
  Administered 2011-03-20: 2 mg via INTRAVENOUS
  Filled 2011-03-20: qty 1

## 2011-03-20 MED ORDER — LEVOTHYROXINE SODIUM 88 MCG PO TABS
88.0000 ug | ORAL_TABLET | ORAL | Status: DC
  Administered 2011-03-21: 88 ug via ORAL
  Filled 2011-03-20 (×2): qty 1

## 2011-03-20 MED ORDER — BISACODYL 10 MG RE SUPP: 10.0000 mg | Freq: Every day | RECTAL | Status: DC | PRN

## 2011-03-20 MED ORDER — ASPIRIN EC 81 MG PO TBEC
81.0000 mg | DELAYED_RELEASE_TABLET | Freq: Every day | ORAL | Status: DC
  Administered 2011-03-21: 81 mg via ORAL
  Filled 2011-03-20: qty 1

## 2011-03-20 SURGICAL SUPPLY — 49 items
BAG SPEC THK2 15X12 ZIP CLS (MISCELLANEOUS) ×1
BAG ZIPLOCK 12X15 (MISCELLANEOUS) ×2 IMPLANT
BLADE OSCILLATING/SAGITTAL (BLADE) ×2
BLADE SW THK.38XMED LNG THN (BLADE) ×1 IMPLANT
BNDG COHESIVE 6X5 TAN NS LF (GAUZE/BANDAGES/DRESSINGS) IMPLANT
BUR OVAL CARBIDE 4.0 (BURR) ×2 IMPLANT
CLEANER TIP ELECTROSURG 2X2 (MISCELLANEOUS) ×2 IMPLANT
CLOSURE STERI STRIP 1/2 X4 (GAUZE/BANDAGES/DRESSINGS) ×2 IMPLANT
CLOTH BEACON ORANGE TIMEOUT ST (SAFETY) ×2 IMPLANT
DRAPE POUCH INSTRU U-SHP 10X18 (DRAPES) ×2 IMPLANT
DRSG ADAPTIC 3X8 NADH LF (GAUZE/BANDAGES/DRESSINGS) ×2 IMPLANT
DRSG EMULSION OIL 3X3 NADH (GAUZE/BANDAGES/DRESSINGS) ×2 IMPLANT
DRSG PAD ABDOMINAL 8X10 ST (GAUZE/BANDAGES/DRESSINGS) ×2 IMPLANT
DURAPREP 26ML APPLICATOR (WOUND CARE) ×2 IMPLANT
ELECT REM PT RETURN 9FT ADLT (ELECTROSURGICAL) ×2
ELECTRODE REM PT RTRN 9FT ADLT (ELECTROSURGICAL) ×1 IMPLANT
FLOSEAL 10ML (HEMOSTASIS) IMPLANT
GLOVE BIO SURGEON STRL SZ 6.5 (GLOVE) ×2 IMPLANT
GLOVE BIOGEL PI IND STRL 8.5 (GLOVE) ×1 IMPLANT
GLOVE BIOGEL PI INDICATOR 8.5 (GLOVE) ×1
GLOVE ECLIPSE 8.0 STRL XLNG CF (GLOVE) ×2 IMPLANT
GOWN PREVENTION PLUS LG XLONG (DISPOSABLE) ×4 IMPLANT
GOWN STRL REIN XL XLG (GOWN DISPOSABLE) ×4 IMPLANT
KIT BASIN OR (CUSTOM PROCEDURE TRAY) ×2 IMPLANT
MANIFOLD NEPTUNE II (INSTRUMENTS) ×2 IMPLANT
NEEDLE MA TROC 1/2 (NEEDLE) IMPLANT
NS IRRIG 1000ML POUR BTL (IV SOLUTION) IMPLANT
PACK SHOULDER CUSTOM OPM052 (CUSTOM PROCEDURE TRAY) ×2 IMPLANT
PASSER SUT SWANSON 36MM LOOP (INSTRUMENTS) IMPLANT
PATCH TISSUE MEND 3X3CM (Orthopedic Implant) ×2 IMPLANT
POSITIONER SURGICAL ARM (MISCELLANEOUS) ×2 IMPLANT
SLING ARM IMMOBILIZER LRG (SOFTGOODS) ×2 IMPLANT
SLING ARM IMMOBILIZER MED (SOFTGOODS) ×2 IMPLANT
SPONGE GAUZE 4X4 STERILE 39 (GAUZE/BANDAGES/DRESSINGS) ×2 IMPLANT
SPONGE SURGIFOAM ABS GEL 100 (HEMOSTASIS) IMPLANT
STAPLER VISISTAT 35W (STAPLE) ×2 IMPLANT
STRIP CLOSURE SKIN 1/2X4 (GAUZE/BANDAGES/DRESSINGS) ×2 IMPLANT
SUCTION FRAZIER 12FR DISP (SUCTIONS) ×2 IMPLANT
SUT BONE WAX W31G (SUTURE) ×2 IMPLANT
SUT ETHIBOND NAB CT1 #1 30IN (SUTURE) IMPLANT
SUT MNCRL AB 4-0 PS2 18 (SUTURE) ×2 IMPLANT
SUT VIC AB 0 CT1 27 (SUTURE) ×2
SUT VIC AB 0 CT1 27XBRD ANTBC (SUTURE) ×1 IMPLANT
SUT VIC AB 1 CT1 27 (SUTURE) ×4
SUT VIC AB 1 CT1 27XBRD ANTBC (SUTURE) ×2 IMPLANT
SUT VIC AB 2-0 CT1 27 (SUTURE)
SUT VIC AB 2-0 CT1 27XBRD (SUTURE) IMPLANT
TAPE HYPAFIX 4 X10 (GAUZE/BANDAGES/DRESSINGS) ×2 IMPLANT
TOWEL OR 17X26 10 PK STRL BLUE (TOWEL DISPOSABLE) ×4 IMPLANT

## 2011-03-20 NOTE — Brief Op Note (Signed)
03/20/2011  1:21 PM  PATIENT:  Barry Mcdaniel  78 y.o. male  PRE-OPERATIVE DIAGNOSIS:  Right Shoulder Rotator Cuff Tear  POST-OPERATIVE DIAGNOSIS:  Right Shoulder Rotator Cuff Tear  PROCEDURE:  Procedure(s): ROTATOR CUFF REPAIR SHOULDER OPEN  SURGEON:  Surgeon(s): Lexi Conaty A Ellyse Rotolo  PHYSICIAN ASSISTANT:   ASSISTANTS: Counselling psychologist   ANESTHESIA:   general  EBL:     BLOOD ADMINISTERED:none  DRAINS: none   LOCAL MEDICATIONS USED:  BUPIVICAINE 20CC mixed with 20cc of Normal Saline  SPECIMEN:  No Specimen  DISPOSITION OF SPECIMEN:  N/A  COUNTS:  YES  TOURNIQUET:  * No tourniquets in log *  DICTATION: .Other Dictation: Dictation Number [id number]  PLAN OF CARE: Admit for overnight observation  PATIENT DISPOSITION:  PACU - hemodynamically stable.   Delay start of Pharmacological VTE agent (>24hrs) due to surgical blood loss or risk of bleeding:  {YES/NO/NOT APPLICABLE:20182

## 2011-03-20 NOTE — Anesthesia Preprocedure Evaluation (Addendum)
Anesthesia Evaluation  Patient identified by MRN, date of birth, ID band Patient awake    Reviewed: Allergy & Precautions, H&P , NPO status , Patient's Chart, lab work & pertinent test results  Airway Mallampati: II TM Distance: >3 FB Neck ROM: Full    Dental No notable dental hx. (+) Edentulous Upper and Edentulous Lower   Pulmonary neg pulmonary ROS, asthma ,  clear to auscultation+ rhonchi  Pulmonary exam normal + wheezing (mild sonorous)      Cardiovascular hypertension, Pt. on medications + Past MI (medical managment. no symptoms) and neg cardio ROS Regular Normal    Neuro/Psych Negative Neurological ROS  Negative Psych ROS   GI/Hepatic negative GI ROS, Neg liver ROS, hiatal hernia, GERD-  ,  Endo/Other  Negative Endocrine ROSDiabetes mellitus-, Insulin DependentHypothyroidism   Renal/GU negative Renal ROS  Genitourinary negative   Musculoskeletal negative musculoskeletal ROS (+)   Abdominal   Peds negative pediatric ROS (+)  Hematology negative hematology ROS (+)   Anesthesia Other Findings   Reproductive/Obstetrics negative OB ROS                          Anesthesia Physical Anesthesia Plan  ASA: III  Anesthesia Plan: General   Post-op Pain Management:    Induction: Intravenous  Airway Management Planned: Oral ETT  Additional Equipment:   Intra-op Plan:   Post-operative Plan: Extubation in OR  Informed Consent: I have reviewed the patients History and Physical, chart, labs and discussed the procedure including the risks, benefits and alternatives for the proposed anesthesia with the patient or authorized representative who has indicated his/her understanding and acceptance.   Dental advisory given  Plan Discussed with:   Anesthesia Plan Comments: (Poor pulmonary status precludes brachial plexus block. )        Anesthesia Quick Evaluation

## 2011-03-20 NOTE — Transfer of Care (Signed)
Immediate Anesthesia Transfer of Care Note  Patient: Barry Mcdaniel  Procedure(s) Performed:  ROTATOR CUFF REPAIR SHOULDER OPEN  Patient Location: PACU  Anesthesia Type: General  Level of Consciousness: awake and alert   Airway & Oxygen Therapy: Patient Spontanous Breathing  Post-op Assessment: Report given to PACU RN and Post -op Vital signs reviewed and stable  Post vital signs: stable  Complications: No apparent anesthesia complications

## 2011-03-20 NOTE — Anesthesia Postprocedure Evaluation (Signed)
  Anesthesia Post-op Note  Patient: Barry Mcdaniel  Procedure(s) Performed:  ROTATOR CUFF REPAIR SHOULDER OPEN  Patient Location: PACU  Anesthesia Type: General  Level of Consciousness: awake and alert   Airway and Oxygen Therapy: Patient Spontanous Breathing  Post-op Pain: mild  Post-op Assessment: Post-op Vital signs reviewed, Patient's Cardiovascular Status Stable, Respiratory Function Stable, Patent Airway and No signs of Nausea or vomiting  Post-op Vital Signs: stable  Complications: No apparent anesthesia complications

## 2011-03-20 NOTE — Interval H&P Note (Signed)
History and Physical Interval Note:  03/20/2011 11:22 AM  Barry Mcdaniel  has presented today for surgery, with the diagnosis of Right Shoulder Rotator Cuff Tear  The various methods of treatment have been discussed with the patient and family. After consideration of risks, benefits and other options for treatment, the patient has consented to  Procedure(s): ROTATOR CUFF REPAIR SHOULDER OPEN as a surgical intervention .  The patients' history has been reviewed, patient examined, no change in status, stable for surgery.  I have reviewed the patients' chart and labs.  Questions were answered to the patient's satisfaction.     Maryiah Olvey A

## 2011-03-21 ENCOUNTER — Encounter (HOSPITAL_COMMUNITY): Payer: Self-pay | Admitting: Orthopedic Surgery

## 2011-03-21 LAB — GLUCOSE, CAPILLARY
Glucose-Capillary: 66 mg/dL — ABNORMAL LOW (ref 70–99)
Glucose-Capillary: 72 mg/dL (ref 70–99)

## 2011-03-21 MED ORDER — METHOCARBAMOL 500 MG PO TABS: 500.0000 mg | ORAL_TABLET | Freq: Four times a day (QID) | ORAL | Status: AC | PRN

## 2011-03-21 MED ORDER — OXYCODONE-ACETAMINOPHEN 5-325 MG PO TABS: 1.0000 | ORAL_TABLET | ORAL | Status: AC | PRN

## 2011-03-21 NOTE — Discharge Summary (Signed)
Physician Discharge Summary  Patient ID: Barry Mcdaniel MRN: [id number] DOB/AGE: 1932/12/26 78 y.o.  Admit date: 03/20/2011 Discharge date: 03/21/2011  Admission Diagnoses:Torn rotator cuff on Right  Discharge Diagnoses: Torn Right Rotator cuff Active Problems:  * No active hospital problems. *    Discharged Condition: Improved  Hospital Course: Improved  Consults:None  Significant Diagnostic Studies: None    Discharge Exam: Blood pressure 146/67, pulse 88, temperature 97.5 F (36.4 C), temperature source Oral, resp. rate 18, height 5\' 11"  (1.803 m), weight 87.998 kg (194 lb), SpO2 97.00%. Extremities: extremities normal, atraumatic, no cyanosis or edema Pulses: 2+ and symmetric  DispositionDC Home  Medication List  As of 03/21/2011  7:40 AM   ASK your doctor about these medications         aspirin EC 81 MG tablet      celecoxib 200 MG capsule   Commonly known as: CELEBREX      finasteride 5 MG tablet   Commonly known as: PROSCAR      gabapentin 300 MG capsule   Commonly known as: NEURONTIN      HYDROcodone-acetaminophen 5-500 MG per tablet   Commonly known as: VICODIN      insulin lispro 100 UNIT/ML injection   Commonly known as: HUMALOG      LANTUS SOLOSTAR 100 UNIT/ML injection   Generic drug: insulin glargine      levothyroxine 88 MCG tablet   Commonly known as: SYNTHROID, LEVOTHROID      menthol-thymol Liqd      pioglitazone 15 MG tablet   Commonly known as: ACTOS      rosuvastatin 20 MG tablet   Commonly known as: CRESTOR      trolamine salicylate 10 % cream   Commonly known as: ASPERCREME             Signed: Corona Popovich A 03/21/2011, 7:40 AM

## 2011-03-21 NOTE — Progress Notes (Signed)
Subjective: Comfortable and dressing dry . Sling adjusted.    Objective: Vital signs in last 24 hours: Temp:  [97.5 F (36.4 C)-99.1 F (37.3 C)] 97.5 F (36.4 C) (01/10 0220) Pulse Rate:  [57-93] 88  (01/10 0220) Resp:  [7-20] 18  (01/10 0220) BP: (134-187)/(60-164) 146/67 mmHg (01/10 0220) SpO2:  [97 %-100 %] 97 % (01/10 0220) Weight:  [87.998 kg (194 lb)] 87.998 kg (194 lb) (01/09 2202)  Intake/Output from previous day: 01/09 0701 - 01/10 0700 In: 1840 [P.O.:240; I.V.:1600] Out: 200 [Urine:150; Blood:50] Intake/Output this shift:    No results found for this basename: HGB:5 in the last 72 hours No results found for this basename: WBC:2,RBC:2,HCT:2,PLT:2 in the last 72 hours No results found for this basename: NA:2,K:2,CL:2,CO2:2,BUN:2,CREATININE:2,GLUCOSE:2,CALCIUM:2 in the last 72 hours No results found for this basename: LABPT:2,INR:2 in the last 72 hours  Neurologically intact Intact pulses distally  Assessment/Plan: DC today after seen by OT   Barry Mcdaniel A 03/21/2011, 7:34 AM

## 2011-03-21 NOTE — Progress Notes (Signed)
OT Note Eval completed and filed in shadow chart. All education completed. Pt will have necessary level of A by wife upon d/c. Progress rehab of the shoulder as ordered by MD post f/u visit. Pt presents with no further OT needs in acute. Will sign off.  Garrel Ridgel, OTR/L  Pager [phone] 03/21/2011

## 2011-03-21 NOTE — Op Note (Signed)
NAMERACHAEL, ZAPANTA NO.:  [id number]  MEDICAL RECORD NO.:  [id number]  LOCATION:  1344                         FACILITY:  [facility]  PHYSICIAN:  Georges Lynch. Shuntia Exton, M.D.DATE OF BIRTH:  08-29-1932  DATE OF PROCEDURE:  03/20/2011 DATE OF DISCHARGE:                              OPERATIVE REPORT   SURGEON:  Georges Lynch. Darrelyn Hillock, M.D.  ASSISTANT:  Dimitri Ped, [place].  PREOPERATIVE DIAGNOSES: 1. Severe impingement syndrome, right shoulder. 2. Partial tear, rotator cuff tendon, right shoulder.  POSTOPERATIVE DIAGNOSES: 1. Severe impingement syndrome, right shoulder. 2. Partial tear, rotator cuff tendon, right shoulder.  OPERATION: 1. Open acromionectomy and acromioplasty, right shoulder. 2. Repair of rotator cuff tendon tear utilizing a TissueMend graft     with one anchor.  PROCEDURE IN DETAIL:  Under general anesthesia, routine orthopedic prepping and draping in the right upper extremity was carried out.  He had clindamycin 600 mg IV.  He was placed in the beach chair position. The appropriate time-out was carried out  prior to any surgery.  I also marked the appropriate right arm in the holding area.  We did not do an interscalene nerve block because of some previous pulmonary issues.  The procedure under general anesthesia, as I mentioned, routine orthopedic prep and draping carried out.  Incision was made over the anterior aspect of the right shoulder.  Bleeders identified and cauterized.  I then split the deltoid tendon from the acromion by sharp dissection, partially dissected it off the acromion.  I then split the proximal part of the deltoid muscle.  I went down and noted a severe impingement of the acromion on the rotator cuff when they literally tore the rotator cuff.  At this time, I protected the remaining cuff with a Bennett retractor and utilized the oscillating saw and a burr to do a partial acromionectomy and acromioplasty.  I thoroughly irrigated  out the area. I bone-waxed the undersurface of the acromion.  Following that, I then repaired the tendon utilizing a TissueMend graft 3 x 3 with 1 anchor in a usual fashion.  Following that, we had good clearance of the subacromial space.  He had a good repair.  I thoroughly irrigated out the area, reapproximated deltoid tendon muscle in usual fashion.  I injected 40 cc mixture of 20 cc of Exparel, which is by bupivacaine.  Local anesthetic mixed with 20 cc of normal saline.  The subcu was closed with 0 Vicryl.  Remaining part of skin was closed in usual fashion.  Sterile dressings were applied.  The patient was placed in a shoulder immobilizer.          ______________________________ Georges Lynch Darrelyn Hillock, M.D.     RAG/MEDQ  D:  03/20/2011  T:  03/21/2011  Job:  [id number]  cc:   Dr. Lucianne Muss

## 2011-03-21 NOTE — Progress Notes (Signed)
Patient discharged to home. Dc instructions given with wife at bedside. Wife signed DC paperwork as pt had surgery on rt am and unable to sign. Prescriptions x 2 given. No concerns voiced. Left unit in wheelchair pushed by nurse tech. Left in good condition.

## 2011-04-04 DIAGNOSIS — M75120 Complete rotator cuff tear or rupture of unspecified shoulder, not specified as traumatic: Secondary | ICD-10-CM | POA: Diagnosis not present

## 2011-04-09 DIAGNOSIS — M75120 Complete rotator cuff tear or rupture of unspecified shoulder, not specified as traumatic: Secondary | ICD-10-CM | POA: Diagnosis not present

## 2011-04-11 DIAGNOSIS — M75120 Complete rotator cuff tear or rupture of unspecified shoulder, not specified as traumatic: Secondary | ICD-10-CM | POA: Diagnosis not present

## 2011-04-12 MED FILL — Ropivacaine HCl Inj 5 MG/ML: INTRAMUSCULAR | Qty: 30 | Status: AC

## 2011-04-16 DIAGNOSIS — M75120 Complete rotator cuff tear or rupture of unspecified shoulder, not specified as traumatic: Secondary | ICD-10-CM | POA: Diagnosis not present

## 2011-04-18 DIAGNOSIS — M75120 Complete rotator cuff tear or rupture of unspecified shoulder, not specified as traumatic: Secondary | ICD-10-CM | POA: Diagnosis not present

## 2011-04-23 DIAGNOSIS — M75120 Complete rotator cuff tear or rupture of unspecified shoulder, not specified as traumatic: Secondary | ICD-10-CM | POA: Diagnosis not present

## 2011-04-25 DIAGNOSIS — M75120 Complete rotator cuff tear or rupture of unspecified shoulder, not specified as traumatic: Secondary | ICD-10-CM | POA: Diagnosis not present

## 2011-04-30 DIAGNOSIS — M75120 Complete rotator cuff tear or rupture of unspecified shoulder, not specified as traumatic: Secondary | ICD-10-CM | POA: Diagnosis not present

## 2011-05-02 DIAGNOSIS — M75120 Complete rotator cuff tear or rupture of unspecified shoulder, not specified as traumatic: Secondary | ICD-10-CM | POA: Diagnosis not present

## 2011-05-20 ENCOUNTER — Encounter (INDEPENDENT_AMBULATORY_CARE_PROVIDER_SITE_OTHER): Payer: Medicare Other | Admitting: Ophthalmology

## 2011-05-27 DIAGNOSIS — E039 Hypothyroidism, unspecified: Secondary | ICD-10-CM | POA: Diagnosis not present

## 2011-05-27 DIAGNOSIS — E785 Hyperlipidemia, unspecified: Secondary | ICD-10-CM | POA: Diagnosis not present

## 2011-05-27 DIAGNOSIS — R82998 Other abnormal findings in urine: Secondary | ICD-10-CM | POA: Diagnosis not present

## 2011-05-29 DIAGNOSIS — E785 Hyperlipidemia, unspecified: Secondary | ICD-10-CM | POA: Diagnosis not present

## 2011-05-29 DIAGNOSIS — R82998 Other abnormal findings in urine: Secondary | ICD-10-CM | POA: Diagnosis not present

## 2011-05-29 DIAGNOSIS — E039 Hypothyroidism, unspecified: Secondary | ICD-10-CM | POA: Diagnosis not present

## 2011-05-29 DIAGNOSIS — D649 Anemia, unspecified: Secondary | ICD-10-CM | POA: Diagnosis not present

## 2011-06-12 DIAGNOSIS — R350 Frequency of micturition: Secondary | ICD-10-CM | POA: Diagnosis not present

## 2011-06-12 DIAGNOSIS — R82998 Other abnormal findings in urine: Secondary | ICD-10-CM | POA: Diagnosis not present

## 2011-06-12 DIAGNOSIS — C61 Malignant neoplasm of prostate: Secondary | ICD-10-CM | POA: Diagnosis not present

## 2011-06-19 DIAGNOSIS — N3942 Incontinence without sensory awareness: Secondary | ICD-10-CM | POA: Diagnosis not present

## 2011-06-19 DIAGNOSIS — N3000 Acute cystitis without hematuria: Secondary | ICD-10-CM | POA: Diagnosis not present

## 2011-06-19 DIAGNOSIS — R82998 Other abnormal findings in urine: Secondary | ICD-10-CM | POA: Diagnosis not present

## 2011-06-19 DIAGNOSIS — C61 Malignant neoplasm of prostate: Secondary | ICD-10-CM | POA: Diagnosis not present

## 2011-07-18 DIAGNOSIS — E291 Testicular hypofunction: Secondary | ICD-10-CM | POA: Diagnosis not present

## 2011-07-18 DIAGNOSIS — N390 Urinary tract infection, site not specified: Secondary | ICD-10-CM | POA: Diagnosis not present

## 2011-07-18 DIAGNOSIS — F329 Major depressive disorder, single episode, unspecified: Secondary | ICD-10-CM | POA: Diagnosis not present

## 2011-07-18 DIAGNOSIS — C61 Malignant neoplasm of prostate: Secondary | ICD-10-CM | POA: Diagnosis not present

## 2011-08-20 DIAGNOSIS — M75120 Complete rotator cuff tear or rupture of unspecified shoulder, not specified as traumatic: Secondary | ICD-10-CM | POA: Diagnosis not present

## 2011-08-20 DIAGNOSIS — M5090 Cervical disc disorder, unspecified, unspecified cervical region: Secondary | ICD-10-CM | POA: Diagnosis not present

## 2011-08-26 DIAGNOSIS — C61 Malignant neoplasm of prostate: Secondary | ICD-10-CM | POA: Diagnosis not present

## 2011-08-26 DIAGNOSIS — E291 Testicular hypofunction: Secondary | ICD-10-CM | POA: Diagnosis not present

## 2011-08-26 DIAGNOSIS — N3000 Acute cystitis without hematuria: Secondary | ICD-10-CM | POA: Diagnosis not present

## 2011-08-26 DIAGNOSIS — R3915 Urgency of urination: Secondary | ICD-10-CM | POA: Diagnosis not present

## 2011-09-02 DIAGNOSIS — D649 Anemia, unspecified: Secondary | ICD-10-CM | POA: Diagnosis not present

## 2011-09-04 ENCOUNTER — Other Ambulatory Visit: Payer: Self-pay | Admitting: Endocrinology

## 2011-09-04 ENCOUNTER — Ambulatory Visit
Admission: RE | Admit: 2011-09-04 | Discharge: 2011-09-04 | Disposition: A | Discharge status: Home / Self Care | Payer: Medicare Other | Source: Ambulatory Visit | Attending: Endocrinology | Admitting: Endocrinology

## 2011-09-04 DIAGNOSIS — R05 Cough: Secondary | ICD-10-CM | POA: Diagnosis not present

## 2011-09-04 DIAGNOSIS — R0789 Other chest pain: Secondary | ICD-10-CM | POA: Diagnosis not present

## 2011-09-04 DIAGNOSIS — R079 Chest pain, unspecified: Secondary | ICD-10-CM

## 2011-09-04 DIAGNOSIS — F329 Major depressive disorder, single episode, unspecified: Secondary | ICD-10-CM | POA: Diagnosis not present

## 2011-09-04 DIAGNOSIS — E291 Testicular hypofunction: Secondary | ICD-10-CM | POA: Diagnosis not present

## 2011-09-04 DIAGNOSIS — I10 Essential (primary) hypertension: Secondary | ICD-10-CM | POA: Diagnosis not present

## 2011-09-23 DIAGNOSIS — R030 Elevated blood-pressure reading, without diagnosis of hypertension: Secondary | ICD-10-CM | POA: Diagnosis not present

## 2011-09-23 DIAGNOSIS — J42 Unspecified chronic bronchitis: Secondary | ICD-10-CM | POA: Diagnosis not present

## 2011-09-23 DIAGNOSIS — I4949 Other premature depolarization: Secondary | ICD-10-CM | POA: Diagnosis not present

## 2011-09-23 DIAGNOSIS — F329 Major depressive disorder, single episode, unspecified: Secondary | ICD-10-CM | POA: Diagnosis not present

## 2011-10-16 DIAGNOSIS — M25519 Pain in unspecified shoulder: Secondary | ICD-10-CM | POA: Diagnosis not present

## 2011-11-19 DIAGNOSIS — C61 Malignant neoplasm of prostate: Secondary | ICD-10-CM | POA: Diagnosis not present

## 2011-11-26 DIAGNOSIS — E039 Hypothyroidism, unspecified: Secondary | ICD-10-CM | POA: Diagnosis not present

## 2011-11-26 DIAGNOSIS — C61 Malignant neoplasm of prostate: Secondary | ICD-10-CM | POA: Diagnosis not present

## 2011-11-26 DIAGNOSIS — E291 Testicular hypofunction: Secondary | ICD-10-CM | POA: Diagnosis not present

## 2011-11-26 DIAGNOSIS — N3000 Acute cystitis without hematuria: Secondary | ICD-10-CM | POA: Diagnosis not present

## 2011-11-26 DIAGNOSIS — R82998 Other abnormal findings in urine: Secondary | ICD-10-CM | POA: Diagnosis not present

## 2011-11-26 DIAGNOSIS — R972 Elevated prostate specific antigen [PSA]: Secondary | ICD-10-CM | POA: Diagnosis not present

## 2011-11-28 DIAGNOSIS — E291 Testicular hypofunction: Secondary | ICD-10-CM | POA: Diagnosis not present

## 2011-11-28 DIAGNOSIS — Z23 Encounter for immunization: Secondary | ICD-10-CM | POA: Diagnosis not present

## 2011-11-28 DIAGNOSIS — E039 Hypothyroidism, unspecified: Secondary | ICD-10-CM | POA: Diagnosis not present

## 2011-12-03 DIAGNOSIS — M5030 Other cervical disc degeneration, unspecified cervical region: Secondary | ICD-10-CM | POA: Diagnosis not present

## 2011-12-12 DIAGNOSIS — D239 Other benign neoplasm of skin, unspecified: Secondary | ICD-10-CM | POA: Diagnosis not present

## 2011-12-12 DIAGNOSIS — L821 Other seborrheic keratosis: Secondary | ICD-10-CM | POA: Diagnosis not present

## 2011-12-12 DIAGNOSIS — L570 Actinic keratosis: Secondary | ICD-10-CM | POA: Diagnosis not present

## 2011-12-12 DIAGNOSIS — D1801 Hemangioma of skin and subcutaneous tissue: Secondary | ICD-10-CM | POA: Diagnosis not present

## 2012-01-30 DIAGNOSIS — E785 Hyperlipidemia, unspecified: Secondary | ICD-10-CM | POA: Diagnosis not present

## 2012-01-30 DIAGNOSIS — R82998 Other abnormal findings in urine: Secondary | ICD-10-CM | POA: Diagnosis not present

## 2012-01-30 DIAGNOSIS — E291 Testicular hypofunction: Secondary | ICD-10-CM | POA: Diagnosis not present

## 2012-02-04 DIAGNOSIS — E236 Other disorders of pituitary gland: Secondary | ICD-10-CM | POA: Diagnosis not present

## 2012-02-04 DIAGNOSIS — E785 Hyperlipidemia, unspecified: Secondary | ICD-10-CM | POA: Diagnosis not present

## 2012-02-04 DIAGNOSIS — E039 Hypothyroidism, unspecified: Secondary | ICD-10-CM | POA: Diagnosis not present

## 2012-02-04 DIAGNOSIS — I10 Essential (primary) hypertension: Secondary | ICD-10-CM | POA: Diagnosis not present

## 2012-03-11 HISTORY — PX: CATARACT EXTRACTION W/ INTRAOCULAR LENS IMPLANT: SHX1309

## 2012-03-17 DIAGNOSIS — E039 Hypothyroidism, unspecified: Secondary | ICD-10-CM | POA: Diagnosis not present

## 2012-03-19 DIAGNOSIS — G479 Sleep disorder, unspecified: Secondary | ICD-10-CM | POA: Diagnosis not present

## 2012-03-19 DIAGNOSIS — E039 Hypothyroidism, unspecified: Secondary | ICD-10-CM | POA: Diagnosis not present

## 2012-03-19 DIAGNOSIS — I10 Essential (primary) hypertension: Secondary | ICD-10-CM | POA: Diagnosis not present

## 2012-04-08 DIAGNOSIS — E119 Type 2 diabetes mellitus without complications: Secondary | ICD-10-CM | POA: Diagnosis not present

## 2012-04-08 DIAGNOSIS — H2510 Age-related nuclear cataract, unspecified eye: Secondary | ICD-10-CM | POA: Diagnosis not present

## 2012-04-22 DIAGNOSIS — I10 Essential (primary) hypertension: Secondary | ICD-10-CM | POA: Diagnosis not present

## 2012-04-22 DIAGNOSIS — J42 Unspecified chronic bronchitis: Secondary | ICD-10-CM | POA: Diagnosis not present

## 2012-04-22 DIAGNOSIS — Z1331 Encounter for screening for depression: Secondary | ICD-10-CM | POA: Diagnosis not present

## 2012-04-22 DIAGNOSIS — E291 Testicular hypofunction: Secondary | ICD-10-CM | POA: Diagnosis not present

## 2012-04-22 DIAGNOSIS — I441 Atrioventricular block, second degree: Secondary | ICD-10-CM | POA: Diagnosis not present

## 2012-04-22 DIAGNOSIS — Z0000 Encounter for general adult medical examination without abnormal findings: Secondary | ICD-10-CM | POA: Diagnosis not present

## 2012-04-22 DIAGNOSIS — N390 Urinary tract infection, site not specified: Secondary | ICD-10-CM | POA: Diagnosis not present

## 2012-04-30 DIAGNOSIS — Z1211 Encounter for screening for malignant neoplasm of colon: Secondary | ICD-10-CM | POA: Diagnosis not present

## 2012-05-08 ENCOUNTER — Encounter: Payer: Self-pay | Admitting: Internal Medicine

## 2012-05-08 DIAGNOSIS — I441 Atrioventricular block, second degree: Secondary | ICD-10-CM | POA: Diagnosis not present

## 2012-05-22 DIAGNOSIS — H2510 Age-related nuclear cataract, unspecified eye: Secondary | ICD-10-CM | POA: Diagnosis not present

## 2012-06-05 DIAGNOSIS — I441 Atrioventricular block, second degree: Secondary | ICD-10-CM | POA: Diagnosis not present

## 2012-06-05 DIAGNOSIS — G4733 Obstructive sleep apnea (adult) (pediatric): Secondary | ICD-10-CM | POA: Diagnosis not present

## 2012-06-06 DIAGNOSIS — G4733 Obstructive sleep apnea (adult) (pediatric): Secondary | ICD-10-CM | POA: Diagnosis not present

## 2012-06-08 DIAGNOSIS — H2589 Other age-related cataract: Secondary | ICD-10-CM | POA: Diagnosis not present

## 2012-06-08 DIAGNOSIS — H2510 Age-related nuclear cataract, unspecified eye: Secondary | ICD-10-CM | POA: Diagnosis not present

## 2012-06-08 DIAGNOSIS — H269 Unspecified cataract: Secondary | ICD-10-CM | POA: Diagnosis not present

## 2012-06-18 DIAGNOSIS — G4733 Obstructive sleep apnea (adult) (pediatric): Secondary | ICD-10-CM | POA: Diagnosis not present

## 2012-06-24 DIAGNOSIS — I10 Essential (primary) hypertension: Secondary | ICD-10-CM | POA: Diagnosis not present

## 2012-06-24 DIAGNOSIS — E039 Hypothyroidism, unspecified: Secondary | ICD-10-CM | POA: Diagnosis not present

## 2012-06-24 DIAGNOSIS — E785 Hyperlipidemia, unspecified: Secondary | ICD-10-CM | POA: Diagnosis not present

## 2012-07-29 ENCOUNTER — Ambulatory Visit: Payer: Medicare Other | Admitting: Internal Medicine

## 2012-08-05 DIAGNOSIS — E039 Hypothyroidism, unspecified: Secondary | ICD-10-CM | POA: Diagnosis not present

## 2012-08-05 DIAGNOSIS — F329 Major depressive disorder, single episode, unspecified: Secondary | ICD-10-CM | POA: Diagnosis not present

## 2012-08-05 DIAGNOSIS — G4733 Obstructive sleep apnea (adult) (pediatric): Secondary | ICD-10-CM | POA: Diagnosis not present

## 2012-08-07 DIAGNOSIS — M67919 Unspecified disorder of synovium and tendon, unspecified shoulder: Secondary | ICD-10-CM | POA: Diagnosis not present

## 2012-08-07 DIAGNOSIS — M719 Bursopathy, unspecified: Secondary | ICD-10-CM | POA: Diagnosis not present

## 2012-10-01 ENCOUNTER — Other Ambulatory Visit: Payer: Self-pay | Admitting: *Deleted

## 2012-10-01 MED ORDER — ROSUVASTATIN CALCIUM 20 MG PO TABS: 20.0000 mg | ORAL_TABLET | Freq: Every day | ORAL | Status: DC

## 2012-10-02 DIAGNOSIS — R972 Elevated prostate specific antigen [PSA]: Secondary | ICD-10-CM | POA: Diagnosis not present

## 2012-10-06 ENCOUNTER — Other Ambulatory Visit: Payer: Medicare Other

## 2012-10-06 ENCOUNTER — Other Ambulatory Visit: Payer: Self-pay | Admitting: *Deleted

## 2012-10-06 DIAGNOSIS — IMO0001 Reserved for inherently not codable concepts without codable children: Secondary | ICD-10-CM

## 2012-10-06 DIAGNOSIS — E039 Hypothyroidism, unspecified: Secondary | ICD-10-CM

## 2012-10-06 LAB — LIPID PANEL
Cholesterol: 139 mg/dL (ref 0–200)
Triglycerides: 140 mg/dL (ref 0.0–149.0)

## 2012-10-06 LAB — COMPREHENSIVE METABOLIC PANEL
Albumin: 3.6 g/dL (ref 3.5–5.2)
Alkaline Phosphatase: 78 U/L (ref 39–117)
BUN: 20 mg/dL (ref 6–23)
CO2: 28 mEq/L (ref 19–32)
Calcium: 9 mg/dL (ref 8.4–10.5)
Chloride: 103 mEq/L (ref 96–112)
GFR: 56.8 mL/min — ABNORMAL LOW (ref >60.00)
Glucose, Bld: 253 mg/dL — ABNORMAL HIGH (ref 70–99)
Potassium: 4.5 mEq/L (ref 3.5–5.1)
Total Protein: 6.9 g/dL (ref 6.0–8.3)

## 2012-10-06 LAB — MICROALBUMIN / CREATININE URINE RATIO: Creatinine,U: 263.2 mg/dL

## 2012-10-06 LAB — T4, FREE: Free T4: 1.01 ng/dL (ref 0.60–1.60)

## 2012-10-08 ENCOUNTER — Ambulatory Visit (INDEPENDENT_AMBULATORY_CARE_PROVIDER_SITE_OTHER): Payer: Medicare Other | Admitting: Endocrinology

## 2012-10-08 ENCOUNTER — Encounter: Payer: Self-pay | Admitting: Endocrinology

## 2012-10-08 VITALS — BP 130/64 | HR 100 | Temp 97.6°F | Resp 12 | Ht 71.0 in | Wt 186.8 lb

## 2012-10-08 DIAGNOSIS — I080 Rheumatic disorders of both mitral and aortic valves: Secondary | ICD-10-CM | POA: Diagnosis not present

## 2012-10-08 DIAGNOSIS — F3289 Other specified depressive episodes: Secondary | ICD-10-CM

## 2012-10-08 DIAGNOSIS — F329 Major depressive disorder, single episode, unspecified: Secondary | ICD-10-CM

## 2012-10-08 DIAGNOSIS — C61 Malignant neoplasm of prostate: Secondary | ICD-10-CM

## 2012-10-08 DIAGNOSIS — E039 Hypothyroidism, unspecified: Secondary | ICD-10-CM | POA: Diagnosis not present

## 2012-10-08 DIAGNOSIS — M1990 Unspecified osteoarthritis, unspecified site: Secondary | ICD-10-CM

## 2012-10-08 DIAGNOSIS — E7800 Pure hypercholesterolemia, unspecified: Secondary | ICD-10-CM | POA: Diagnosis not present

## 2012-10-08 DIAGNOSIS — IMO0001 Reserved for inherently not codable concepts without codable children: Secondary | ICD-10-CM

## 2012-10-08 NOTE — Progress Notes (Signed)
Patient ID: Barry Mcdaniel, male   DOB: 27-Feb-1933, 79 y.o.   MRN: [id number]  Reason for Appointment: Diabetes follow-up   History of Present Illness   Diagnosis: Type 2 diabetes mellitus, date of diagnosis: 1997.   Complications: none.  Monitors blood glucose: Twice a day.  Glucometer: One Touch.  Blood Glucose readings: Before breakfast: Range 78-212 with median about 130, between 3-6 PM 196-366 and late evening median about 210, range 153-415 from  download .  Hypoglycemia frequency: none recently with lowest reading 67 about 6 PM.  Meals: variable; less snacks on sweets. For breakfast he will eat egg, meat, grits.  Physical activity: exercise: off and on.  Dietician visit: Most recent:1/14.  Other contributing factors: Dietary indiscretions.   He has been on basal bolus insulin regimen for a few years. His blood sugars are not well-controlled and A1c is usually over 8% although one time was 7.2.   His blood sugars are inconsistent during the day and generally higher in the afternoons mostly because of noncompliance with his mealtime doses particularly lunchtime. This is despite reminders to take his insulin for all meals and snacks but he is frequently eating some snacks including sweets at various times Highest blood sugar is 415.   The fasting readings are also high at times but less consistently, he thinks he is compliant with his Lantus and this may be from overnight snacking.  The last HbgA1c was 7.2%, previously 8.1 in 1/14 The insulin regimen is described as premeal rapid analog 3-12 ac, Lantus 46 once a day acb.  The microalbumin has been tested , and the result is 39.    HYPERLIPIDEMIA: Well controlled with Crestor and LDL is 64 now without side effects   DEPRESSION:   On the last visit expressed that he gets anxious and depressed, sometimes near nervous breakdown. Also had more anger and irritability. She thinks his symptoms are better with starting Effexor XR 75 mg which  he is tolerating   PAIN: still complaining about joint pains in various areas including shoulder. Not getting consistent relief with Vicodin and hasn't discussed with the orthopedic surgeon. Still on Celebrex.  CARDIAC history: Although he has no recent chest pain or shortness of breath and no known  CAD he and his wife are concerned about abnormal heart valves and he wants a second opinion. No recent shortness of breath or pedal edema     Appointment on 10/06/2012  Component Date Value Range Status  . Microalb, Ur 10/06/2012 12.1* 0.0 - 1.9 mg/dL Final  . Creatinine,U 57/84/6962 263.2   Final  . Microalb Creat Ratio 10/06/2012 4.6  0.0 - 30.0 mg/g Final  . Hemoglobin A1C 10/06/2012 8.8* 4.6 - 6.5 % Final   Glycemic Control Guidelines for People with Diabetes:Non Diabetic:  <6%Goal of Therapy: <7%Additional Action Suggested:  >8%   . Sodium 10/06/2012 138  135 - 145 mEq/L Final  . Potassium 10/06/2012 4.5  3.5 - 5.1 mEq/L Final  . Chloride 10/06/2012 103  96 - 112 mEq/L Final  . CO2 10/06/2012 28  19 - 32 mEq/L Final  . Glucose, Bld 10/06/2012 253* 70 - 99 mg/dL Final  . BUN 95/28/4132 20  6 - 23 mg/dL Final  . Creatinine, Ser 10/06/2012 1.3  0.4 - 1.5 mg/dL Final  . Total Bilirubin 10/06/2012 0.4  0.3 - 1.2 mg/dL Final  . Alkaline Phosphatase 10/06/2012 78  39 - 117 U/L Final  . AST 10/06/2012 15  0 - 37 U/L  Final  . ALT 10/06/2012 11  0 - 53 U/L Final  . Total Protein 10/06/2012 6.9  6.0 - 8.3 g/dL Final  . Albumin 40/98/1191 3.6  3.5 - 5.2 g/dL Final  . Calcium 47/82/9562 9.0  8.4 - 10.5 mg/dL Final  . GFR 13/10/6576 56.80* >60.00 mL/min Final  . Cholesterol 10/06/2012 139  0 - 200 mg/dL Final   ATP III Classification       Desirable:  < 200 mg/dL               Borderline High:  200 - 239 mg/dL          High:  > = 469 mg/dL  . Triglycerides 10/06/2012 140.0  0.0 - 149.0 mg/dL Final   Normal:  <629 mg/dLBorderline High:  150 - 199 mg/dL  . HDL 10/06/2012 46.80  >39.00 mg/dL Final   . VLDL 52/84/1324 28.0  0.0 - 40.0 mg/dL Final  . LDL Cholesterol 10/06/2012 64  0 - 99 mg/dL Final  . Total CHOL/HDL Ratio 10/06/2012 3   Final                  Men          Women1/2 Average Risk     3.4          3.3Average Risk          5.0          4.42X Average Risk          9.6          7.13X Average Risk          15.0          11.0                      . TSH 10/06/2012 2.27  0.35 - 5.50 uIU/mL Final  . Free T4 10/06/2012 1.01  0.60 - 1.60 ng/dL Final      Medication List       This list is accurate as of: 10/08/12 11:59 PM.  Always use your most recent med list.               aspirin EC 81 MG tablet  Take 81 mg by mouth daily.     celecoxib 200 MG capsule  Commonly known as:  CELEBREX  Take 200 mg by mouth daily.     finasteride 5 MG tablet  Commonly known as:  PROSCAR  Take 5 mg by mouth every morning.     gabapentin 300 MG capsule  Commonly known as:  NEURONTIN  Take 300 mg by mouth 4 (four) times daily.     insulin lispro 100 UNIT/ML injection  Commonly known as:  HUMALOG  Inject 3-10 Units into the skin 3 (three) times daily before meals.     LANTUS SOLOSTAR 100 UNIT/ML injection  Generic drug:  insulin glargine  Inject 46 Units into the skin daily before breakfast.     levothyroxine 100 MCG tablet  Commonly known as:  SYNTHROID, LEVOTHROID  Take 100 mcg by mouth daily before breakfast.     menthol-thymol Liqd  Apply 1 application topically as needed. For pain.     pioglitazone 15 MG tablet  Commonly known as:  ACTOS  Take 15 mg by mouth daily.     rosuvastatin 20 MG tablet  Commonly known as:  CRESTOR  Take 1 tablet (20 mg total) by mouth daily.  trolamine salicylate 10 % cream  Commonly known as:  ASPERCREME  Apply 1 application topically as needed. For pain.        Allergies:  Allergies  Allergen Reactions  . Penicillins Swelling    Past Medical History  Diagnosis Date  . Hypertension     hx of hypertension no longer on meds    . Myocardial infarction     2.5 years ago   . Heart murmur   . Peripheral vascular disease   . Hypotension   . Asthma   . Recurrent upper respiratory infection (URI)     hx of bronchitis   . Diabetes mellitus   . Pneumonia     hx of pneumonia as a child   . Hypothyroidism   . Anemia     hx of year ago no problems now per pt   . Chronic kidney disease     hx of uti recently ua done 03/13/11- clear per pt   . GERD (gastroesophageal reflux disease)   . H/O hiatal hernia     hx of hiatal hernia 1983   . Arthritis     generalized   . Cancer     hx of prostate cancer     Past Surgical History  Procedure Laterality Date  . Tonsillectomy    . Other surgical history      arthroscopic right knee surgery   . Back surgery      hx of x 2   . Prostate surgery      x 2   . Other surgical history      right arm surgery due to trauma   . Shoulder open rotator cuff repair  03/20/2011    Procedure: ROTATOR CUFF REPAIR SHOULDER OPEN;  Surgeon: Jacki Cones;  Location: [facility];  Service: Orthopedics;  Laterality: Right;  . Spine surgery      Family History  Problem Relation Age of Onset  . Heart disease Mother   . Stroke Father     Social History:  reports that he has never smoked. He has never used smokeless tobacco. He reports that he does not use illicit drugs. His alcohol history is not on file.  Review of Systems  He sleeps irregularly partly because of pain and usually getting up late in the morning  He is supposed to have had sleep apnea but he thinks symptoms are minor and has not wanted to use a CPAP    PSA 4-5 usually and now 13, is going to see urologist for followup   History of ? Pulmonary embolism in the past  No recent history of leg edema  He has had some sharp pains in his lower legs with tingling sometimes relieved by gabapentin   Examination:   BP 130/64  Pulse 100  Temp(Src) 97.6 F (36.4 C)  Resp 12  Ht 5\' 11"  (1.803 m)  Wt 186 lb 12.8 oz (84.732  kg)  BMI 26.06 kg/m2  SpO2 99%  Body mass index is 26.06 kg/(m^2).   Assesment:   Diabetes type 2, uncontrolled - 250.02  The patient's diabetes control appears to be  still poorly controlled  and generally higher in the afternoons mostly because of noncompliance with his mealtime doses.  Also had inconsistent diet with frequent snacks during the day and late at night not covered with insulin . He periodically eats snacks like ice cream at various times including during the night and this may be also related to depression  Has not improved his compliance despite discussing the issues above on each visit Overall fasting readings are not as high but A1c is higher than before and this was discussed.    BONE/joint pain: He will need to be evaluated by urologist for his high PSA, not clear if he has any bony metastases of the prostate cancer He was also asked to discuss a referral to pain clinic with his orthopedic surgeon who is going to see him soon   PLAN:  Follow instructions for meal planning and mealtime insulin as discussed in detail Will refer him to cardiologist for evaluation of his abnormal echocardiogram Consider pain management referral Increase Effexor to 2 tablets for better control of depression and sleep disorder and pain  Arnetia Bronk 10/12/2012, 12:40 PM

## 2012-10-08 NOTE — Patient Instructions (Addendum)
May try Effexor twice daily  MUST take 4-10 units humalog at every meal even if just a starchy snack

## 2012-10-09 DIAGNOSIS — R972 Elevated prostate specific antigen [PSA]: Secondary | ICD-10-CM | POA: Diagnosis not present

## 2012-10-09 DIAGNOSIS — C61 Malignant neoplasm of prostate: Secondary | ICD-10-CM | POA: Diagnosis not present

## 2012-10-12 ENCOUNTER — Other Ambulatory Visit: Payer: Self-pay | Admitting: *Deleted

## 2012-10-12 ENCOUNTER — Encounter: Payer: Self-pay | Admitting: Endocrinology

## 2012-10-12 ENCOUNTER — Telehealth: Payer: Self-pay | Admitting: *Deleted

## 2012-10-12 DIAGNOSIS — M1990 Unspecified osteoarthritis, unspecified site: Secondary | ICD-10-CM | POA: Insufficient documentation

## 2012-10-12 DIAGNOSIS — C61 Malignant neoplasm of prostate: Secondary | ICD-10-CM | POA: Insufficient documentation

## 2012-10-12 DIAGNOSIS — E039 Hypothyroidism, unspecified: Secondary | ICD-10-CM | POA: Insufficient documentation

## 2012-10-12 DIAGNOSIS — E1165 Type 2 diabetes mellitus with hyperglycemia: Secondary | ICD-10-CM | POA: Insufficient documentation

## 2012-10-12 DIAGNOSIS — I080 Rheumatic disorders of both mitral and aortic valves: Secondary | ICD-10-CM | POA: Insufficient documentation

## 2012-10-12 DIAGNOSIS — E7800 Pure hypercholesterolemia, unspecified: Secondary | ICD-10-CM | POA: Insufficient documentation

## 2012-10-12 MED ORDER — HYDROCODONE-ACETAMINOPHEN 5-300 MG PO TABS: ORAL_TABLET | ORAL | Status: DC

## 2012-10-12 NOTE — Telephone Encounter (Signed)
rx sent

## 2012-10-13 ENCOUNTER — Other Ambulatory Visit (HOSPITAL_COMMUNITY): Payer: Self-pay | Admitting: Urology

## 2012-10-13 DIAGNOSIS — C61 Malignant neoplasm of prostate: Secondary | ICD-10-CM

## 2012-10-16 ENCOUNTER — Encounter (HOSPITAL_COMMUNITY)
Admission: RE | Admit: 2012-10-16 | Discharge: 2012-10-16 | Disposition: A | Discharge status: Home / Self Care | Payer: Medicare Other | Source: Ambulatory Visit | Attending: Urology | Admitting: Urology

## 2012-10-16 ENCOUNTER — Encounter (HOSPITAL_COMMUNITY): Payer: Self-pay

## 2012-10-16 DIAGNOSIS — R599 Enlarged lymph nodes, unspecified: Secondary | ICD-10-CM | POA: Diagnosis not present

## 2012-10-16 DIAGNOSIS — C61 Malignant neoplasm of prostate: Secondary | ICD-10-CM | POA: Diagnosis not present

## 2012-10-16 DIAGNOSIS — R9389 Abnormal findings on diagnostic imaging of other specified body structures: Secondary | ICD-10-CM | POA: Diagnosis not present

## 2012-10-16 MED ORDER — TECHNETIUM TC 99M MEDRONATE IV KIT
26.2000 | PACK | Freq: Once | INTRAVENOUS | Status: AC | PRN
  Administered 2012-10-16: 26.2 via INTRAVENOUS

## 2012-10-21 DIAGNOSIS — C61 Malignant neoplasm of prostate: Secondary | ICD-10-CM | POA: Diagnosis not present

## 2012-10-21 DIAGNOSIS — R972 Elevated prostate specific antigen [PSA]: Secondary | ICD-10-CM | POA: Diagnosis not present

## 2012-11-11 ENCOUNTER — Encounter: Payer: Self-pay | Admitting: Cardiovascular Disease

## 2012-11-16 ENCOUNTER — Telehealth: Payer: Self-pay | Admitting: Endocrinology

## 2012-11-16 ENCOUNTER — Ambulatory Visit (INDEPENDENT_AMBULATORY_CARE_PROVIDER_SITE_OTHER): Payer: Medicare Other | Admitting: Endocrinology

## 2012-11-16 ENCOUNTER — Encounter: Payer: Self-pay | Admitting: Endocrinology

## 2012-11-16 VITALS — BP 130/82 | HR 100 | Temp 98.2°F | Resp 12 | Ht 70.5 in | Wt 189.5 lb

## 2012-11-16 DIAGNOSIS — E7800 Pure hypercholesterolemia, unspecified: Secondary | ICD-10-CM

## 2012-11-16 DIAGNOSIS — E039 Hypothyroidism, unspecified: Secondary | ICD-10-CM

## 2012-11-16 NOTE — Telephone Encounter (Signed)
Noted, it is already in his chart

## 2012-11-16 NOTE — Progress Notes (Signed)
Patient ID: Barry Mcdaniel, male   DOB: 02/01/1933, 79 y.o.   MRN: [id number]  Reason for Appointment: Diabetes follow-up   History of Present Illness   Diagnosis: Type 2 diabetes mellitus, date of diagnosis: 1997.   He has been on basal bolus insulin regimen for a few years. His blood sugars are not well-controlled and A1c is usually over 8% although one time was 7.2.   RECENT history: He has recently been checking blood sugars very infrequently and mostly in the mornings or midday depending on when he is waking up Typically he has rather high readings around supper time from noncompliance with his lunchtime coverage as well as snacking in the afternoon including eating sweets He thinks he is trying to watch his snacks a little better in the afternoon but still does not take insulin at lunch since he is eating inconsistently and does not consider eating a sandwich a meal. This is despite reminders to take his insulin for all meals and snacks  The insulin regimen is described as premeal rapid analog 3-12 ac, Lantus 46 once a day acb.    The before breakfast readings are also high at times but less consistently, he thinks he is compliant with his Lantus and this may be from overnight snacking.  Complications: none.  Monitors blood glucose: Twice a day.  Glucometer: One Touch.  Blood Glucose readings: Before breakfast: 142-215, may be median 148 with range 81-198, evening range 100-328 with median 266,  checking mostly between 7-9 PM  Hypoglycemia frequency: none recently with lowest reading 67 about 6 PM.  Meals: variable; less snacks on sweets. For breakfast he will eat egg, meat, grits. Lunch sandwich supper avg 5-7 pm Physical activity: exercise: more recently.  Dietician visit: Most recent:1/14.  Other contributing factors: Dietary indiscretions less now.   The last HbgA1c was 7.2%, previously 8.1 in 1/14  Lab Results  Component Value Date   HGBA1C 8.8* 10/06/2012    The microalbumin  has been tested , and the result is 39.    HYPERLIPIDEMIA: Well controlled with Crestor and LDL is 64 now without side effects   DEPRESSION: Because of some continued symptoms of depression his Effexor was increased to 2 tablets daily which he thinks he is taking and he feels his depression is less   PAIN: Not complaining as much about joint pains in various areas including shoulder, not clear if he is better with increasing Effexor. Overall he thinks he is feeling better also and is trying to be more mobile.  He thinks he needs to gain weight  LABS:  No visits with results within 1 Week(s) from this visit. Latest known visit with results is:  Appointment on 10/06/2012  Component Date Value Range Status  . Microalb, Ur 10/06/2012 12.1* 0.0 - 1.9 mg/dL Final  . Creatinine,U 40/98/1191 263.2   Final  . Microalb Creat Ratio 10/06/2012 4.6  0.0 - 30.0 mg/g Final  . Hemoglobin A1C 10/06/2012 8.8* 4.6 - 6.5 % Final   Glycemic Control Guidelines for People with Diabetes:Non Diabetic:  <6%Goal of Therapy: <7%Additional Action Suggested:  >8%   . Sodium 10/06/2012 138  135 - 145 mEq/L Final  . Potassium 10/06/2012 4.5  3.5 - 5.1 mEq/L Final  . Chloride 10/06/2012 103  96 - 112 mEq/L Final  . CO2 10/06/2012 28  19 - 32 mEq/L Final  . Glucose, Bld 10/06/2012 253* 70 - 99 mg/dL Final  . BUN 47/82/9562 20  6 - 23 mg/dL  Final  . Creatinine, Ser 10/06/2012 1.3  0.4 - 1.5 mg/dL Final  . Total Bilirubin 10/06/2012 0.4  0.3 - 1.2 mg/dL Final  . Alkaline Phosphatase 10/06/2012 78  39 - 117 U/L Final  . AST 10/06/2012 15  0 - 37 U/L Final  . ALT 10/06/2012 11  0 - 53 U/L Final  . Total Protein 10/06/2012 6.9  6.0 - 8.3 g/dL Final  . Albumin 41/32/4401 3.6  3.5 - 5.2 g/dL Final  . Calcium 02/72/5366 9.0  8.4 - 10.5 mg/dL Final  . GFR 44/05/4740 56.80* >60.00 mL/min Final  . Cholesterol 10/06/2012 139  0 - 200 mg/dL Final   ATP III Classification       Desirable:  < 200 mg/dL               Borderline  High:  200 - 239 mg/dL          High:  > = 595 mg/dL  . Triglycerides 10/06/2012 140.0  0.0 - 149.0 mg/dL Final   Normal:  <638 mg/dLBorderline High:  150 - 199 mg/dL  . HDL 10/06/2012 46.80  >39.00 mg/dL Final  . VLDL 75/64/3329 28.0  0.0 - 40.0 mg/dL Final  . LDL Cholesterol 10/06/2012 64  0 - 99 mg/dL Final  . Total CHOL/HDL Ratio 10/06/2012 3   Final                  Men          Women1/2 Average Risk     3.4          3.3Average Risk          5.0          4.42X Average Risk          9.6          7.13X Average Risk          15.0          11.0                      . TSH 10/06/2012 2.27  0.35 - 5.50 uIU/mL Final  . Free T4 10/06/2012 1.01  0.60 - 1.60 ng/dL Final      Medication List       This list is accurate as of: 11/16/12 11:15 AM.  Always use your most recent med list.               aspirin EC 81 MG tablet  Take 81 mg by mouth daily.     celecoxib 200 MG capsule  Commonly known as:  CELEBREX  Take 200 mg by mouth daily.     finasteride 5 MG tablet  Commonly known as:  PROSCAR  Take 5 mg by mouth every morning.     gabapentin 300 MG capsule  Commonly known as:  NEURONTIN  Take 300 mg by mouth 4 (four) times daily.     Hydrocodone-Acetaminophen 5-300 MG Tabs  Take one tablet by mouth three times a day as needed for pain     insulin lispro 100 UNIT/ML injection  Commonly known as:  HUMALOG  Inject 3-10 Units into the skin 3 (three) times daily before meals.     LANTUS SOLOSTAR 100 UNIT/ML injection  Generic drug:  insulin glargine  Inject 46 Units into the skin daily before breakfast.     levothyroxine 100 MCG tablet  Commonly known as:  SYNTHROID, LEVOTHROID  Take 100 mcg  by mouth daily before breakfast.     menthol-thymol Liqd  Apply 1 application topically as needed. For pain.     pioglitazone 15 MG tablet  Commonly known as:  ACTOS  Take 15 mg by mouth daily.     rosuvastatin 20 MG tablet  Commonly known as:  CRESTOR  Take 1 tablet (20 mg total) by  mouth daily.     trolamine salicylate 10 % cream  Commonly known as:  ASPERCREME  Apply 1 application topically as needed. For pain.        Allergies:  Allergies  Allergen Reactions  . Penicillins Swelling    Past Medical History  Diagnosis Date  . Hypertension     hx of hypertension no longer on meds   . Myocardial infarction     2.5 years ago   . Heart murmur   . Peripheral vascular disease   . Hypotension   . Asthma   . Recurrent upper respiratory infection (URI)     hx of bronchitis   . Diabetes mellitus   . Pneumonia     hx of pneumonia as a child   . Hypothyroidism   . Anemia     hx of year ago no problems now per pt   . Chronic kidney disease     hx of uti recently ua done 03/13/11- clear per pt   . GERD (gastroesophageal reflux disease)   . H/O hiatal hernia     hx of hiatal hernia 1983   . Arthritis     generalized   . Cancer     hx of prostate cancer     Past Surgical History  Procedure Laterality Date  . Tonsillectomy    . Other surgical history      arthroscopic right knee surgery   . Back surgery      hx of x 2   . Prostate surgery      x 2   . Other surgical history      right arm surgery due to trauma   . Shoulder open rotator cuff repair  03/20/2011    Procedure: ROTATOR CUFF REPAIR SHOULDER OPEN;  Surgeon: Jacki Cones;  Location: [facility];  Service: Orthopedics;  Laterality: Right;  . Spine surgery      Family History  Problem Relation Age of Onset  . Heart disease Mother   . Stroke Father     Social History:  reports that he has never smoked. He has never used smokeless tobacco. He reports that he does not use illicit drugs. His alcohol history is not on file.  Review of Systems  He sleeps irregularly partly because of pain and usually getting up late in the morning  He was diagnosed have had sleep apnea but he thinks symptoms are minor and has not wanted to use a CPAP    PSA higher recently, has seen urologist for followup    History of ? Pulmonary embolism in the past  History of mild hypothyroidism  He has had some sharp pains in his lower legs with tingling sometimes relieved by gabapentin   Examination:   BP 130/82  Pulse 100  Temp(Src) 98.2 F (36.8 C)  Resp 12  Ht 5' 10.5" (1.791 m)  Wt 189 lb 8 oz (85.957 kg)  BMI 26.8 kg/m2  SpO2 96%  Body mass index is 26.8 kg/(m^2).   No ankle edema  Assesment:   Diabetes type 2, uncontrolled - 250.02  The patient's diabetes  control appears to be  still poorly controlled but difficult to assess since he is checking blood sugars mostly before his first meal which is variable and also sporadically later in the day. Again discussed needing to cover his lunch with at least a small amount of insulin, starting with 6 units and increasing if afternoon readings are higher Will also need to take insulin if eating out. Discussed needing to check blood sugars more consistently at various times as before. Although he is still keeping a diary he is not motivated to check as much  He will try to continue improving his diet and discussed that he does not need to gain weight which would make his insulin resistance worse Continue same dose of insulin for the time being   Hypothyroidism: Will check thyroid levels on the next visit  Depression: Subjectively better and he'll continue an and 50 mg of Effexor   Joreen Swearingin 11/16/2012, 11:15 AM

## 2012-11-16 NOTE — Patient Instructions (Addendum)
MUST TAKE HUMALOG FOR EVERY MEAL BEFORE EATING, AT LEAST 6 AT LUNCH, can take within 10 min of the meal  Please check blood sugars at least half the time about 2 hours after any meal and as directed on waking up. Please bring blood sugar monitor to each visit

## 2012-11-19 DIAGNOSIS — C61 Malignant neoplasm of prostate: Secondary | ICD-10-CM | POA: Diagnosis not present

## 2012-11-20 ENCOUNTER — Telehealth: Payer: Self-pay | Admitting: Endocrinology

## 2012-12-04 ENCOUNTER — Institutional Professional Consult (permissible substitution): Payer: Medicare Other | Admitting: Cardiovascular Disease

## 2012-12-11 ENCOUNTER — Ambulatory Visit (INDEPENDENT_AMBULATORY_CARE_PROVIDER_SITE_OTHER): Payer: Medicare Other | Admitting: Cardiovascular Disease

## 2012-12-11 ENCOUNTER — Encounter: Payer: Self-pay | Admitting: Cardiovascular Disease

## 2012-12-11 VITALS — BP 124/72 | HR 100 | Ht 70.0 in | Wt 190.0 lb

## 2012-12-11 DIAGNOSIS — E7800 Pure hypercholesterolemia, unspecified: Secondary | ICD-10-CM | POA: Diagnosis not present

## 2012-12-11 DIAGNOSIS — I080 Rheumatic disorders of both mitral and aortic valves: Secondary | ICD-10-CM

## 2012-12-11 DIAGNOSIS — I499 Cardiac arrhythmia, unspecified: Secondary | ICD-10-CM

## 2012-12-11 DIAGNOSIS — R011 Cardiac murmur, unspecified: Secondary | ICD-10-CM | POA: Diagnosis not present

## 2012-12-11 NOTE — Patient Instructions (Addendum)

## 2012-12-11 NOTE — Assessment & Plan Note (Signed)
Discussed low carb diet.  Target hemoglobin A1c is 6.5 or less.  Continue current medications.  

## 2012-12-11 NOTE — Assessment & Plan Note (Addendum)
Monitor results show no PAF  I think he is at risk for this No symptoms  Consider adding beta blocker in future

## 2012-12-11 NOTE — Assessment & Plan Note (Signed)
Cholesterol is at goal.  Continue current dose of statin and diet Rx.  No myalgias or side effects.  F/U  LFT's in 6 months. Lab Results  Component Value Date   LDLCALC 64 10/06/2012

## 2012-12-11 NOTE — Assessment & Plan Note (Signed)
Asymptomatic  I would have read the AS as mild as valve area over 1.2 and gradients rather low.  F/U echo since its been more than 6 months and if stable can do yearly

## 2012-12-11 NOTE — Progress Notes (Signed)
Patient ID: Barry Mcdaniel, male   DOB: 1933/01/07, 79 y.o.   MRN: [id number] 79 yo self referred. Saw Dr Mayford Knife at [facility] in March and did not like the encounter.  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 Long standing history of irregularity in pulse No documented PAF No palpitations.  Reviewed multiple records form [facility]  Event monitor:  PAC;s no afib Stress myovue normal EF 66% Echo moderte LVH moderate AS mean gradient 16 peak 23 AVA 1.23  He is currently asymptomatic.  Gets some dizzyness in mid day BS ok Thinks its related to effexor and neurontin No chest pain palpitations or dyspnea.     ROS: Denies fever, malais, weight loss, blurry vision, decreased visual acuity, cough, sputum, SOB, hemoptysis, pleuritic pain, palpitaitons, heartburn, abdominal pain, melena, lower extremity edema, claudication, or rash.  All other systems reviewed and negative   General: Affect appropriate Healthy:  appears stated age HEENT: normal Neck supple with no adenopathy JVP normal no bruits no thyromegaly Lungs clear with no wheezing and good diaphragmatic motion Heart:  S1/S2 AS  murmur,rub, gallop or click PMI normal Abdomen: benighn, BS positve, no tenderness, no AAA no bruit.  No HSM or HJR Distal pulses intact with no bruits No edema Neuro non-focal Skin warm and dry No muscular weakness  Medications Current Outpatient Prescriptions  Medication Sig Dispense Refill  . aspirin EC 81 MG tablet Take 81 mg by mouth daily.       . celecoxib (CELEBREX) 200 MG capsule Take 200 mg by mouth daily.       . finasteride (PROSCAR) 5 MG tablet Take 5 mg by mouth every morning.       . gabapentin (NEURONTIN) 300 MG capsule Take 300 mg by mouth 4 (four) times daily.       . Hydrocodone-Acetaminophen 5-300 MG TABS Take one tablet by mouth three times a day as needed for pain  90 each  5  . insulin glargine (LANTUS SOLOSTAR) 100 UNIT/ML injection Inject 46 Units into the skin daily before breakfast.       . insulin lispro  (HUMALOG) 100 UNIT/ML injection Inject 3-10 Units into the skin 3 (three) times daily before meals.       Marland Kitchen levothyroxine (SYNTHROID, LEVOTHROID) 100 MCG tablet Take 100 mcg by mouth daily before breakfast.      . menthol-thymol (ABSORBINE JR) LIQD Apply 1 application topically as needed. For pain.      . pioglitazone (ACTOS) 15 MG tablet Take 15 mg by mouth daily.       . rosuvastatin (CRESTOR) 20 MG tablet Take 1 tablet (20 mg total) by mouth daily.  30 tablet  5  . trolamine salicylate (ASPERCREME) 10 % cream Apply 1 application topically as needed. For pain.      Marland Kitchen venlafaxine XR (EFFEXOR-XR) 75 MG 24 hr capsule Take 75 mg by mouth daily.       No current facility-administered medications for this visit.    Allergies Penicillins  Family History: Family History  Problem Relation Age of Onset  . Heart disease Mother   . Stroke Father     Social History: History   Social History  . Marital Status: Married    Spouse Name: N/A    Number of Children: N/A  . Years of Education: N/A   Occupational History  . Not on file.   Social History Main Topics  . Smoking status: Never Smoker   . Smokeless tobacco: Never Used  . Alcohol Use: Not on  file     Comment: none since 1972   . Drug Use: No  . Sexual Activity: Not on file   Other Topics Concern  . Not on file   Social History Narrative  . No narrative on file    Electrocardiogram:  SR nonspecific ST [place] changes   Assessment and Plan

## 2012-12-14 DIAGNOSIS — D239 Other benign neoplasm of skin, unspecified: Secondary | ICD-10-CM | POA: Diagnosis not present

## 2012-12-14 DIAGNOSIS — L570 Actinic keratosis: Secondary | ICD-10-CM | POA: Diagnosis not present

## 2012-12-14 DIAGNOSIS — L821 Other seborrheic keratosis: Secondary | ICD-10-CM | POA: Diagnosis not present

## 2012-12-14 DIAGNOSIS — L608 Other nail disorders: Secondary | ICD-10-CM | POA: Diagnosis not present

## 2012-12-14 DIAGNOSIS — D1801 Hemangioma of skin and subcutaneous tissue: Secondary | ICD-10-CM | POA: Diagnosis not present

## 2012-12-15 DIAGNOSIS — C61 Malignant neoplasm of prostate: Secondary | ICD-10-CM | POA: Diagnosis not present

## 2012-12-17 ENCOUNTER — Ambulatory Visit (INDEPENDENT_AMBULATORY_CARE_PROVIDER_SITE_OTHER): Payer: Medicare Other

## 2012-12-17 DIAGNOSIS — Z23 Encounter for immunization: Secondary | ICD-10-CM | POA: Diagnosis not present

## 2012-12-21 DIAGNOSIS — E291 Testicular hypofunction: Secondary | ICD-10-CM | POA: Diagnosis not present

## 2012-12-21 DIAGNOSIS — R972 Elevated prostate specific antigen [PSA]: Secondary | ICD-10-CM | POA: Diagnosis not present

## 2012-12-21 DIAGNOSIS — C61 Malignant neoplasm of prostate: Secondary | ICD-10-CM | POA: Diagnosis not present

## 2012-12-30 ENCOUNTER — Encounter: Payer: Self-pay | Admitting: *Deleted

## 2013-01-04 DIAGNOSIS — H2510 Age-related nuclear cataract, unspecified eye: Secondary | ICD-10-CM | POA: Diagnosis not present

## 2013-01-04 DIAGNOSIS — E119 Type 2 diabetes mellitus without complications: Secondary | ICD-10-CM | POA: Diagnosis not present

## 2013-01-04 LAB — HM DIABETES EYE EXAM

## 2013-01-07 ENCOUNTER — Encounter: Payer: Self-pay | Admitting: *Deleted

## 2013-01-11 ENCOUNTER — Ambulatory Visit (HOSPITAL_COMMUNITY): Payer: Medicare Other | Attending: Cardiology

## 2013-01-11 DIAGNOSIS — R011 Cardiac murmur, unspecified: Secondary | ICD-10-CM

## 2013-01-11 DIAGNOSIS — I359 Nonrheumatic aortic valve disorder, unspecified: Secondary | ICD-10-CM

## 2013-01-11 DIAGNOSIS — E119 Type 2 diabetes mellitus without complications: Secondary | ICD-10-CM | POA: Diagnosis not present

## 2013-01-11 DIAGNOSIS — I059 Rheumatic mitral valve disease, unspecified: Secondary | ICD-10-CM | POA: Insufficient documentation

## 2013-01-11 DIAGNOSIS — E785 Hyperlipidemia, unspecified: Secondary | ICD-10-CM | POA: Insufficient documentation

## 2013-01-11 DIAGNOSIS — I350 Nonrheumatic aortic (valve) stenosis: Secondary | ICD-10-CM

## 2013-01-11 NOTE — Progress Notes (Signed)
Echocardiogram performed.  

## 2013-01-12 ENCOUNTER — Encounter: Payer: Self-pay | Admitting: *Deleted

## 2013-01-13 ENCOUNTER — Other Ambulatory Visit (INDEPENDENT_AMBULATORY_CARE_PROVIDER_SITE_OTHER): Payer: Medicare Other

## 2013-01-13 DIAGNOSIS — IMO0001 Reserved for inherently not codable concepts without codable children: Secondary | ICD-10-CM

## 2013-01-13 DIAGNOSIS — E039 Hypothyroidism, unspecified: Secondary | ICD-10-CM | POA: Diagnosis not present

## 2013-01-13 LAB — BASIC METABOLIC PANEL
CO2: 26 mEq/L (ref 19–32)
Calcium: 8.7 mg/dL (ref 8.4–10.5)
Chloride: 106 mEq/L (ref 96–112)
GFR: 60.51 mL/min (ref >60.00)
Potassium: 4.6 mEq/L (ref 3.5–5.1)
Sodium: 138 mEq/L (ref 135–145)

## 2013-01-13 LAB — COMPREHENSIVE METABOLIC PANEL
AST: 16 U/L (ref 0–37)
Albumin: 3.2 g/dL — ABNORMAL LOW (ref 3.5–5.2)
BUN: 20 mg/dL (ref 6–23)
Calcium: 8.7 mg/dL (ref 8.4–10.5)
Chloride: 106 mEq/L (ref 96–112)
Glucose, Bld: 261 mg/dL — ABNORMAL HIGH (ref 70–99)
Potassium: 4.6 mEq/L (ref 3.5–5.1)
Sodium: 138 mEq/L (ref 135–145)
Total Protein: 6.2 g/dL (ref 6.0–8.3)

## 2013-01-13 LAB — FRUCTOSAMINE: Fructosamine: 319 umol/L — ABNORMAL HIGH (ref <285)

## 2013-01-13 LAB — MICROALBUMIN / CREATININE URINE RATIO
Creatinine,U: 243.6 mg/dL
Microalb Creat Ratio: 1.5 mg/g (ref 0.0–30.0)
Microalb, Ur: 3.7 mg/dL — ABNORMAL HIGH (ref 0.0–1.9)

## 2013-01-13 LAB — HEMOGLOBIN A1C: Hgb A1c MFr Bld: 10.2 % — ABNORMAL HIGH (ref 4.6–6.5)

## 2013-01-13 LAB — LIPID PANEL
Cholesterol: 124 mg/dL (ref 0–200)
LDL Cholesterol: 53 mg/dL (ref 0–99)
Total CHOL/HDL Ratio: 2
Triglycerides: 100 mg/dL (ref 0.0–149.0)

## 2013-01-13 LAB — TSH: TSH: 1.87 u[IU]/mL (ref 0.35–5.50)

## 2013-01-13 LAB — T4, FREE: Free T4: 0.89 ng/dL (ref 0.60–1.60)

## 2013-01-14 ENCOUNTER — Other Ambulatory Visit: Payer: Self-pay | Admitting: *Deleted

## 2013-01-14 MED ORDER — INSULIN GLARGINE 100 UNIT/ML ~~LOC~~ SOLN: 46.0000 [IU] | Freq: Every day | SUBCUTANEOUS | Status: DC

## 2013-01-18 ENCOUNTER — Encounter: Payer: Self-pay | Admitting: Endocrinology

## 2013-01-18 ENCOUNTER — Other Ambulatory Visit: Payer: Self-pay | Admitting: *Deleted

## 2013-01-18 ENCOUNTER — Ambulatory Visit (INDEPENDENT_AMBULATORY_CARE_PROVIDER_SITE_OTHER): Payer: Medicare Other | Admitting: Endocrinology

## 2013-01-18 VITALS — BP 118/74 | HR 60 | Temp 98.5°F | Resp 12 | Ht 70.0 in | Wt 191.5 lb

## 2013-01-18 DIAGNOSIS — E039 Hypothyroidism, unspecified: Secondary | ICD-10-CM | POA: Diagnosis not present

## 2013-01-18 DIAGNOSIS — R42 Dizziness and giddiness: Secondary | ICD-10-CM

## 2013-01-18 DIAGNOSIS — E7800 Pure hypercholesterolemia, unspecified: Secondary | ICD-10-CM

## 2013-01-18 MED ORDER — HYDROCODONE-ACETAMINOPHEN 5-300 MG PO TABS: ORAL_TABLET | ORAL | Status: DC

## 2013-01-18 NOTE — Progress Notes (Signed)
Patient ID: Barry Mcdaniel, male   DOB: [birth year], 79 y.o.   MRN: [id number]  Reason for Appointment: Diabetes follow-up   History of Present Illness   Inactive, travel missed lantus 1-2x Insomnia ice cream Dizzy fell ? BP low  Diagnosis: Type 2 diabetes mellitus, date of diagnosis: 1997.   He has been on basal bolus insulin regimen for a few years. His blood sugars are usually variably controlled and A1c is usually over 8% although one time was 7.2.   RECENT history: His blood sugar control is worse even though he didn't management was discussed with him in detail Is totally off his diet partly because of traveling and eating out because of numerous funerals. Also he has not been active His wife says that he is eating ice cream frequently including at night which helps his throat He missed his Lantus once or twice and not clear if he is taking is Humalog with every meal Is relatively noncompliant with glucose monitoring and checking mostly in the morning As before he has rather high readings around supper time from noncompliance with Humalog for his snacking in the afternoon including eating sweets; does not think he is eating a lunch consistently This is despite reminders to take his insulin for all meals and snacks  Morning readings are also overall high with average about 180  The insulin regimen is described as premeal rapid analog 6-8 am and about 12 at supper ac bid, Lantus 46 once a day acb.   Complications: Neuropathy   Monitors blood glucose:   a day.  Glucometer: One Touch.  Blood Glucose readings: Before breakfast: 116-236 with median about 185 Afternoon and evening 180-367 with about 5 readings recently Hypoglycemia frequency: none recently  Meals: variable; has ice cream and other sweets at all different times. For breakfast he will eat egg, meat, grits. Eating supper around 6 PM  Physical activity: exercise: Irregular recently.  Dietician visit: Most recent:1/14.   Wt  Readings from Last 3 Encounters:  01/18/13 191 lb 8 oz (86.864 kg)  12/11/12 190 lb (86.183 kg)  11/16/12 189 lb 8 oz (85.957 kg)    Lab Results  Component Value Date   HGBA1C 10.2* 01/13/2013    The microalbumin has been tested , and the result is normal now.    HYPERLIPIDEMIA: Well controlled with Crestor and LDL recently 64 without side effects   DEPRESSION: Because of some continued symptoms of depression his Effexor was increased to 2 tablets daily. Although this was more effective his wife stopped it because he was having some dizziness and fell. Does not think he is having any depression now   PAIN: is complaining about joint pains all over. Takes hydrocodone when necessary He also has pain in his left wrist with recent injury and is going to see orthopedic surgeon this week  LABS:  Appointment on 01/13/2013  Component Date Value Range Status  . Fructosamine 01/13/2013 319* <285 umol/L Final   Comment:                            Variations in levels of serum proteins (albumin and immunoglobulins)                          may affect fructosamine results.                             Marland Kitchen  Sodium 01/13/2013 138  135 - 145 mEq/L Final  . Potassium 01/13/2013 4.6  3.5 - 5.1 mEq/L Final  . Chloride 01/13/2013 106  96 - 112 mEq/L Final  . CO2 01/13/2013 26  19 - 32 mEq/L Final  . Glucose, Bld 01/13/2013 261* 70 - 99 mg/dL Final  . BUN 16/12/9602 20  6 - 23 mg/dL Final  . Creatinine, Ser 01/13/2013 1.2  0.4 - 1.5 mg/dL Final  . Calcium 54/11/8117 8.7  8.4 - 10.5 mg/dL Final  . GFR 14/78/2956 60.51  >60.00 mL/min Final  . Sodium 01/13/2013 138  135 - 145 mEq/L Final  . Potassium 01/13/2013 4.6  3.5 - 5.1 mEq/L Final  . Chloride 01/13/2013 106  96 - 112 mEq/L Final  . CO2 01/13/2013 26  19 - 32 mEq/L Final  . Glucose, Bld 01/13/2013 261* 70 - 99 mg/dL Final  . BUN 21/30/8657 20  6 - 23 mg/dL Final  . Creatinine, Ser 01/13/2013 1.2  0.4 - 1.5 mg/dL Final  . Total Bilirubin  01/13/2013 0.4  0.3 - 1.2 mg/dL Final  . Alkaline Phosphatase 01/13/2013 70  39 - 117 U/L Final  . AST 01/13/2013 16  0 - 37 U/L Final  . ALT 01/13/2013 9  0 - 53 U/L Final  . Total Protein 01/13/2013 6.2  6.0 - 8.3 g/dL Final  . Albumin 84/69/6295 3.2* 3.5 - 5.2 g/dL Final  . Calcium 28/41/3244 8.7  8.4 - 10.5 mg/dL Final  . GFR 03/13/7251 60.51  >60.00 mL/min Final  . Cholesterol 01/13/2013 124  0 - 200 mg/dL Final   ATP III Classification       Desirable:  < 200 mg/dL               Borderline High:  200 - 239 mg/dL          High:  > = 664 mg/dL  . Triglycerides 01/13/2013 100.0  0.0 - 149.0 mg/dL Final   Normal:  <403 mg/dLBorderline High:  150 - 199 mg/dL  . HDL 01/13/2013 50.90  >39.00 mg/dL Final  . VLDL 47/42/5956 20.0  0.0 - 40.0 mg/dL Final  . LDL Cholesterol 01/13/2013 53  0 - 99 mg/dL Final  . Total CHOL/HDL Ratio 01/13/2013 2   Final                  Men          Women1/2 Average Risk     3.4          3.3Average Risk          5.0          4.42X Average Risk          9.6          7.13X Average Risk          15.0          11.0                      . Microalb, Ur 01/13/2013 3.7* 0.0 - 1.9 mg/dL Final  . Creatinine,U 38/75/6433 243.6   Final  . Microalb Creat Ratio 01/13/2013 1.5  0.0 - 30.0 mg/g Final  . Hemoglobin A1C 01/13/2013 10.2* 4.6 - 6.5 % Final   Glycemic Control Guidelines for People with Diabetes:Non Diabetic:  <6%Goal of Therapy: <7%Additional Action Suggested:  >8%   . TSH 01/13/2013 1.87  0.35 - 5.50 uIU/mL Final  . Free T4 01/13/2013  0.89  0.60 - 1.60 ng/dL Final      Medication List       This list is accurate as of: 01/18/13 11:44 AM.  Always use your most recent med list.               aspirin EC 81 MG tablet  Take 81 mg by mouth daily.     celecoxib 200 MG capsule  Commonly known as:  CELEBREX  Take 200 mg by mouth daily.     finasteride 5 MG tablet  Commonly known as:  PROSCAR  Take 5 mg by mouth every morning.     gabapentin 300 MG capsule   Commonly known as:  NEURONTIN  Take 300 mg by mouth 4 (four) times daily.     Hydrocodone-Acetaminophen 5-300 MG Tabs  Take one tablet by mouth three times a day as needed for pain     insulin glargine 100 UNIT/ML injection  Commonly known as:  LANTUS  Inject 0.46 mLs (46 Units total) into the skin daily before breakfast.     insulin lispro 100 UNIT/ML injection  Commonly known as:  HUMALOG  Inject 3-12 Units into the skin 3 (three) times daily before meals.     levothyroxine 100 MCG tablet  Commonly known as:  SYNTHROID, LEVOTHROID  Take 100 mcg by mouth daily before breakfast.     menthol-thymol Liqd  Apply 1 application topically as needed. For pain.     pioglitazone 15 MG tablet  Commonly known as:  ACTOS  Take 15 mg by mouth daily.     rosuvastatin 20 MG tablet  Commonly known as:  CRESTOR  Take 1 tablet (20 mg total) by mouth daily.     trolamine salicylate 10 % cream  Commonly known as:  ASPERCREME  Apply 1 application topically as needed. For pain.     venlafaxine XR 75 MG 24 hr capsule  Commonly known as:  EFFEXOR-XR  Take 75 mg by mouth daily.        Allergies:  Allergies  Allergen Reactions  . Penicillins Swelling    Past Medical History  Diagnosis Date  . Hypertension     hx of hypertension no longer on meds   . Myocardial infarction     2.5 years ago   . Heart murmur   . Peripheral vascular disease   . Hypotension   . Asthma   . Recurrent upper respiratory infection (URI)     hx of bronchitis   . Diabetes mellitus   . Pneumonia     hx of pneumonia as a child   . Hypothyroidism   . Anemia     hx of year ago no problems now per pt   . Chronic kidney disease     hx of uti recently ua done 03/13/11- clear per pt   . GERD (gastroesophageal reflux disease)   . H/O hiatal hernia     hx of hiatal hernia 1983   . Arthritis     generalized   . Cancer     hx of prostate cancer     Past Surgical History  Procedure Laterality Date  .  Tonsillectomy    . Other surgical history      arthroscopic right knee surgery   . Back surgery      hx of x 2   . Prostate surgery      x 2   . Other surgical history      right arm  surgery due to trauma   . Shoulder open rotator cuff repair  03/20/2011    Procedure: ROTATOR CUFF REPAIR SHOULDER OPEN;  Surgeon: Jacki Cones;  Location: [facility];  Service: Orthopedics;  Laterality: Right;  . Spine surgery      Family History  Problem Relation Age of Onset  . Heart disease Mother   . Stroke Father     Social History:  reports that he has never smoked. He has never used smokeless tobacco. He reports that he does not use illicit drugs. His alcohol history is not on file.  Review of Systems  He sleeps irregularly partly because of pain and usually getting up late in the morning  He was diagnosed have had sleep apnea but he thinks symptoms are minor and has not wanted to use a CPAP    PSA higher recently, has seen urologist for followup   History of ? Pulmonary embolism in the past  History of mild hypothyroidism, TSH is normal  He has had  sharp pains in his lower legs with tingling sometimes relieved by gabapentin  He has had a cardiac evaluation and no further management needed, has mild valvular disease an echo   Examination:   BP 118/74  Pulse 60  Temp(Src) 98.5 F (36.9 C)  Resp 12  Ht 5\' 10"  (1.778 m)  Wt 191 lb 8 oz (86.864 kg)  BMI 27.48 kg/m2  SpO2 97%  Body mass index is 27.48 kg/(m^2).   Standing 118/70 No ankle edema  Assesment:   Diabetes type 2, uncontrolled - 250.02  The patient's diabetes control appears to be  worse with A1c now are the highest level in a while at 10% Most of his poor control related to his diet especially with snacking in between meals with sweets such as ice cream He is not able to control his eating and his blood sugars have been persistently high even with insulin His fasting blood sugars are also high probably from eating  overnight Is a good candidate for trying a GLP-1 drug and discussed in detail how this would be effective Explained to patient how Victoza would be taken and benefits, effects on satiety, glucose and possible side effects Since he will require less insulin with using Victoza will not change it does get He was scheduled to see the nurse educator for detailed instructions and also review of day-to-day management Encouraged him to start being active again Check more blood sugars at various times   History of dizziness: He does not appear orthostatic today, not clear if he has had some autonomic neuropathy  Hypothyroidism: TSH normal on current regimen, to continue the same  Depression: Subjectively better without Effexor currently would consider a different antidepressant if needed again  Hyperlipidemia: Adequately controlled  Total visit time including counseling = 25 minutes  Dequavius Kuhner 01/18/2013, 11:44 AM

## 2013-01-18 NOTE — Patient Instructions (Addendum)
Start VICTOZA injection with the sample pen once daily at the same time of the day.  Dial the dose to 0.6 mg for the first week.  You may  experience nausea in the first few days which usually gets better the After 1 week increase the dose to 1.2mg  daily if no nausea.  You may inject in the stomach, thigh or arm.   You will feel fullness of the stomach with starting the medication and should try to keep portions of food small.    Check more sugars at supper and later

## 2013-01-20 ENCOUNTER — Encounter: Payer: Medicare Other | Attending: Endocrinology | Admitting: Nutrition

## 2013-01-20 ENCOUNTER — Encounter: Payer: Self-pay | Admitting: Nutrition

## 2013-01-20 DIAGNOSIS — IMO0001 Reserved for inherently not codable concepts without codable children: Secondary | ICD-10-CM | POA: Insufficient documentation

## 2013-01-20 DIAGNOSIS — Z713 Dietary counseling and surveillance: Secondary | ICD-10-CM | POA: Diagnosis not present

## 2013-01-20 NOTE — Patient Instructions (Signed)
Use new meter.   Test blood sugars twice daily Take Victoza 0.6 as directed once daily After 7 days, increase dose to 1.2

## 2013-01-20 NOTE — Progress Notes (Signed)
This patient does not want to start Victoza.  He says that he has not been following his diet, and wants to give his dietary changes time.  FBS today was 200.   Says he is "up all night going to the bathroom and sipping on regular Coke.   Diet:  Bfast is 2 eggs, grits and bacon and 1 piece of toast.            Lunch is pot roast, or sandwich with small amount of chips.  Was drinking regular Coke, but has switched to diet coke.          Supper is 6-8 ounces of meat, fish, or chicken--usually baked, and 2 nonstarchy veg., and 2 starchy veg.  Is drinking now:              Water Insulin Dose:  AM; Lantus 46u, and Humalog: 10-12u ac all meals. SBGM:  He thinks his meter is wrong.  All the readings are over 200 despite his change in eating.    He has an old One Touch Ultra. He agreed that if his blood sugars were not down in 1 week, he will start the Victoza     Plan: 1. He was given a new Ultra meter           2. We discussed how the Victoza works, and how it will bring his blood sugar readings down.  He was also shown how to use it, how to increase the dose after 7 days.  He was given a sample and a starter kit.  He was encouraged to read over the literature and to call if questions.           3.  I will call him in one week to see if he is taking this.

## 2013-01-21 DIAGNOSIS — M67919 Unspecified disorder of synovium and tendon, unspecified shoulder: Secondary | ICD-10-CM | POA: Diagnosis not present

## 2013-02-08 DIAGNOSIS — H2510 Age-related nuclear cataract, unspecified eye: Secondary | ICD-10-CM | POA: Diagnosis not present

## 2013-02-09 ENCOUNTER — Encounter: Payer: Self-pay | Admitting: Endocrinology

## 2013-02-09 ENCOUNTER — Ambulatory Visit (INDEPENDENT_AMBULATORY_CARE_PROVIDER_SITE_OTHER): Payer: Medicare Other | Admitting: Endocrinology

## 2013-02-09 VITALS — BP 128/78 | HR 87 | Temp 98.3°F | Resp 12 | Ht 70.0 in | Wt 186.0 lb

## 2013-02-09 DIAGNOSIS — D649 Anemia, unspecified: Secondary | ICD-10-CM | POA: Diagnosis not present

## 2013-02-09 DIAGNOSIS — E7800 Pure hypercholesterolemia, unspecified: Secondary | ICD-10-CM | POA: Diagnosis not present

## 2013-02-09 DIAGNOSIS — E039 Hypothyroidism, unspecified: Secondary | ICD-10-CM | POA: Diagnosis not present

## 2013-02-09 MED ORDER — LIRAGLUTIDE 18 MG/3ML ~~LOC~~ SOPN: 1.2000 mg | PEN_INJECTOR | Freq: Every day | SUBCUTANEOUS | Status: DC

## 2013-02-09 NOTE — Patient Instructions (Addendum)
Victoza 0.6 mg for 2 days then 1.2 mg  Humalog 2-3 units for large snacks in afernoon and night  Please check blood sugars at least half the time about 2 hours after any meal and as directed on waking up. Please bring blood sugar monitor to each visit

## 2013-02-09 NOTE — Progress Notes (Addendum)
Patient ID: Barry Mcdaniel, male   DOB: 05/25/1932, 79 y.o.   MRN: [id number]  Reason for Appointment: Diabetes follow-up   History of Present Illness   Diagnosis: Type 2 diabetes mellitus, date of diagnosis: 1997.   He has been on basal bolus insulin regimen for a few years. His blood sugars are usually variably controlled and A1c is usually over 8% with 1 reading of 7.2.   RECENT history: Because of his poor diet and inadequate insulin management he was referred to the nurse educator for detailed education. Also was started on Victoza because of his tendency to eat a lot of snacks especially sweets in the afternoon He did start taking Victoza and he believes he took the 1.2 mg after a week. His wife thinks that he had better satiety with this Also he has been more consistently trying to watch his diet and has lost weight His blood sugars appear to be significantly better although is checking readings mostly in the mornings and midday He is generally eating only late breakfast and no specific lunch He has taken his mealtime INSULIN more consistently and has been taking a little higher dose at suppertime although is somewhat inconsistent in his answers Today's blood sugar is higher but he ran out of his sample of Victoza and has not had a prescription The insulin regimen is described as premeal rapid analog 6-10 am and 8-10  at supper ac bid, Lantus 46 once a day acb.   Monitors blood glucose: 1.7 times   a day.  Glucometer: One Touch.  Blood Glucose readings:   PREMEAL Breakfast Lunch Dinner Bedtime Overall  Glucose range:  123-196   93-212   116-291  ?    Mean/median:  150   157   166    161    POST-MEAL PC Breakfast PC Lunch PC Dinner  Glucose range: ?   291    Mean/median:      Hypoglycemia frequency: none recently  Meals: variable;  For breakfast at 11 am he will eat egg, meat, grits; occ pancackes; Eating supper around 6 PM; snacks   Physical activity: exercise: Irregular  recently.  Dietician visit: Most recent:1/14.   Wt Readings from Last 3 Encounters:  02/09/13 186 lb (84.369 kg)  01/18/13 191 lb 8 oz (86.864 kg)  12/11/12 190 lb (86.183 kg)    Lab Results  Component Value Date   HGBA1C 10.2* 01/13/2013   Complications: Neuropathy   The microalbumin has been tested , and the result is normal now.    HYPERLIPIDEMIA: Well controlled with Crestor and LDL recently 53 without side effects   DEPRESSION: Did not tolerate Effexor Does not think he is having any depression now  LABS:  No visits with results within 1 Week(s) from this visit. Latest known visit with results is:  Appointment on 01/13/2013  Component Date Value Range Status  . Fructosamine 01/13/2013 319* <285 umol/L Final   Comment:                            Variations in levels of serum proteins (albumin and immunoglobulins)                          may affect fructosamine results.                             Marland Kitchen  Sodium 01/13/2013 138  135 - 145 mEq/L Final  . Potassium 01/13/2013 4.6  3.5 - 5.1 mEq/L Final  . Chloride 01/13/2013 106  96 - 112 mEq/L Final  . CO2 01/13/2013 26  19 - 32 mEq/L Final  . Glucose, Bld 01/13/2013 261* 70 - 99 mg/dL Final  . BUN 45/40/9811 20  6 - 23 mg/dL Final  . Creatinine, Ser 01/13/2013 1.2  0.4 - 1.5 mg/dL Final  . Calcium 91/47/8295 8.7  8.4 - 10.5 mg/dL Final  . GFR 62/13/0865 60.51  >60.00 mL/min Final  . Sodium 01/13/2013 138  135 - 145 mEq/L Final  . Potassium 01/13/2013 4.6  3.5 - 5.1 mEq/L Final  . Chloride 01/13/2013 106  96 - 112 mEq/L Final  . CO2 01/13/2013 26  19 - 32 mEq/L Final  . Glucose, Bld 01/13/2013 261* 70 - 99 mg/dL Final  . BUN 78/46/9629 20  6 - 23 mg/dL Final  . Creatinine, Ser 01/13/2013 1.2  0.4 - 1.5 mg/dL Final  . Total Bilirubin 01/13/2013 0.4  0.3 - 1.2 mg/dL Final  . Alkaline Phosphatase 01/13/2013 70  39 - 117 U/L Final  . AST 01/13/2013 16  0 - 37 U/L Final  . ALT 01/13/2013 9  0 - 53 U/L Final  . Total Protein  01/13/2013 6.2  6.0 - 8.3 g/dL Final  . Albumin 52/84/1324 3.2* 3.5 - 5.2 g/dL Final  . Calcium 40/12/2723 8.7  8.4 - 10.5 mg/dL Final  . GFR 36/64/4034 60.51  >60.00 mL/min Final  . Cholesterol 01/13/2013 124  0 - 200 mg/dL Final   ATP III Classification       Desirable:  < 200 mg/dL               Borderline High:  200 - 239 mg/dL          High:  > = 742 mg/dL  . Triglycerides 01/13/2013 100.0  0.0 - 149.0 mg/dL Final   Normal:  <595 mg/dLBorderline High:  150 - 199 mg/dL  . HDL 01/13/2013 50.90  >39.00 mg/dL Final  . VLDL 63/87/5643 20.0  0.0 - 40.0 mg/dL Final  . LDL Cholesterol 01/13/2013 53  0 - 99 mg/dL Final  . Total CHOL/HDL Ratio 01/13/2013 2   Final                  Men          Women1/2 Average Risk     3.4          3.3Average Risk          5.0          4.42X Average Risk          9.6          7.13X Average Risk          15.0          11.0                      . Microalb, Ur 01/13/2013 3.7* 0.0 - 1.9 mg/dL Final  . Creatinine,U 32/95/1884 243.6   Final  . Microalb Creat Ratio 01/13/2013 1.5  0.0 - 30.0 mg/g Final  . Hemoglobin A1C 01/13/2013 10.2* 4.6 - 6.5 % Final   Glycemic Control Guidelines for People with Diabetes:Non Diabetic:  <6%Goal of Therapy: <7%Additional Action Suggested:  >8%   . TSH 01/13/2013 1.87  0.35 - 5.50 uIU/mL Final  . Free T4 01/13/2013  0.89  0.60 - 1.60 ng/dL Final      Medication List       This list is accurate as of: 02/09/13 10:06 AM.  Always use your most recent med list.               aspirin EC 81 MG tablet  Take 81 mg by mouth daily.     celecoxib 200 MG capsule  Commonly known as:  CELEBREX  Take 200 mg by mouth daily.     finasteride 5 MG tablet  Commonly known as:  PROSCAR  Take 5 mg by mouth every morning.     gabapentin 300 MG capsule  Commonly known as:  NEURONTIN  Take 300 mg by mouth 4 (four) times daily.     Hydrocodone-Acetaminophen 5-300 MG Tabs  Take one tablet by mouth three times a day as needed for pain      insulin glargine 100 UNIT/ML injection  Commonly known as:  LANTUS  Inject 0.46 mLs (46 Units total) into the skin daily before breakfast.     insulin lispro 100 UNIT/ML injection  Commonly known as:  HUMALOG  Inject 3-12 Units into the skin 3 (three) times daily before meals.     levothyroxine 100 MCG tablet  Commonly known as:  SYNTHROID, LEVOTHROID  Take 100 mcg by mouth daily before breakfast.     menthol-thymol Liqd  Apply 1 application topically as needed. For pain.     pioglitazone 15 MG tablet  Commonly known as:  ACTOS  Take 15 mg by mouth daily.     rosuvastatin 20 MG tablet  Commonly known as:  CRESTOR  Take 1 tablet (20 mg total) by mouth daily.     trolamine salicylate 10 % cream  Commonly known as:  ASPERCREME  Apply 1 application topically as needed. For pain.        Allergies:  Allergies  Allergen Reactions  . Penicillins Swelling    Past Medical History  Diagnosis Date  . Hypertension     hx of hypertension no longer on meds   . Myocardial infarction     2.5 years ago   . Heart murmur   . Peripheral vascular disease   . Hypotension   . Asthma   . Recurrent upper respiratory infection (URI)     hx of bronchitis   . Diabetes mellitus   . Pneumonia     hx of pneumonia as a child   . Hypothyroidism   . Anemia     hx of year ago no problems now per pt   . Chronic kidney disease     hx of uti recently ua done 03/13/11- clear per pt   . GERD (gastroesophageal reflux disease)   . H/O hiatal hernia     hx of hiatal hernia 1983   . Arthritis     generalized   . Cancer     hx of prostate cancer     Past Surgical History  Procedure Laterality Date  . Tonsillectomy    . Other surgical history      arthroscopic right knee surgery   . Back surgery      hx of x 2   . Prostate surgery      x 2   . Other surgical history      right arm surgery due to trauma   . Shoulder open rotator cuff repair  03/20/2011    Procedure: ROTATOR CUFF REPAIR  SHOULDER OPEN;  Surgeon: Jacki Cones;  Location: [facility];  Service: Orthopedics;  Laterality: Right;  . Spine surgery      Family History  Problem Relation Age of Onset  . Heart disease Mother   . Stroke Father     Social History:  reports that he has never smoked. He has never used smokeless tobacco. He reports that he does not use illicit drugs. His alcohol history is not on file.  Review of Systems  History of dizziness: Improved now, he thinks this is from stopping his medications  He was diagnosed have had sleep apnea but he thinks symptoms are minor and has not wanted to use a CPAP    History of ? Pulmonary embolism in the past  History of mild hypothyroidism, TSH recently normal  He has had  sharp pains in his lower legs with tingling, relieved by gabapentin but recently stopped this   He has mild valvular disease an echo   Examination:   BP 128/78  Pulse 87  Temp(Src) 98.3 F (36.8 C)  Resp 12  Ht 5\' 10"  (1.778 m)  Wt 186 lb (84.369 kg)  BMI 26.69 kg/m2  SpO2 97%  Body mass index is 26.69 kg/(m^2).   Assesment/Plan:   Diabetes type 2, uncontrolled - 250.02  The patient's diabetes control appears to be improving especially with his watching his diet and starting Victoza His blood sugars do fluctuate some but he has not checked readings after meals in the last few days His problem is again inconsistent diet and eating snacks in the afternoon rather than a full meal and not covering these with Humalog. Discussed need for covering given snacks in the afternoon with Humalog He will check more readings after supper to help adjust his evening Humalog He will go back to Victoza since this is improving his control as well as satiety  Advised him to go back on his Synthroid as well as other medications except Neurontin which she can take as needed for pain Also watch blood pressure at home  [facility] 02/09/2013, 10:06 AM

## 2013-02-15 DIAGNOSIS — H269 Unspecified cataract: Secondary | ICD-10-CM | POA: Diagnosis not present

## 2013-02-15 DIAGNOSIS — H2589 Other age-related cataract: Secondary | ICD-10-CM | POA: Diagnosis not present

## 2013-02-15 DIAGNOSIS — H2510 Age-related nuclear cataract, unspecified eye: Secondary | ICD-10-CM | POA: Diagnosis not present

## 2013-02-22 ENCOUNTER — Other Ambulatory Visit: Payer: Self-pay | Admitting: *Deleted

## 2013-02-22 MED ORDER — LOSARTAN POTASSIUM 25 MG PO TABS: 25.0000 mg | ORAL_TABLET | Freq: Every day | ORAL | Status: DC

## 2013-02-22 MED ORDER — ROSUVASTATIN CALCIUM 10 MG PO TABS: 10.0000 mg | ORAL_TABLET | Freq: Every day | ORAL | Status: DC

## 2013-03-15 ENCOUNTER — Other Ambulatory Visit: Payer: Self-pay | Admitting: *Deleted

## 2013-03-15 MED ORDER — HYDROCODONE-ACETAMINOPHEN 5-300 MG PO TABS: ORAL_TABLET | ORAL | Status: DC

## 2013-03-17 DIAGNOSIS — C61 Malignant neoplasm of prostate: Secondary | ICD-10-CM | POA: Diagnosis not present

## 2013-03-17 DIAGNOSIS — R972 Elevated prostate specific antigen [PSA]: Secondary | ICD-10-CM | POA: Diagnosis not present

## 2013-03-24 DIAGNOSIS — R972 Elevated prostate specific antigen [PSA]: Secondary | ICD-10-CM | POA: Diagnosis not present

## 2013-03-24 DIAGNOSIS — C61 Malignant neoplasm of prostate: Secondary | ICD-10-CM | POA: Diagnosis not present

## 2013-03-31 ENCOUNTER — Other Ambulatory Visit: Payer: Self-pay | Admitting: *Deleted

## 2013-03-31 MED ORDER — GLUCOSE BLOOD VI STRP: ORAL_STRIP | Status: DC

## 2013-04-12 ENCOUNTER — Other Ambulatory Visit (INDEPENDENT_AMBULATORY_CARE_PROVIDER_SITE_OTHER): Payer: Medicare Other

## 2013-04-12 DIAGNOSIS — D649 Anemia, unspecified: Secondary | ICD-10-CM

## 2013-04-12 DIAGNOSIS — E1165 Type 2 diabetes mellitus with hyperglycemia: Principal | ICD-10-CM

## 2013-04-12 DIAGNOSIS — IMO0001 Reserved for inherently not codable concepts without codable children: Secondary | ICD-10-CM

## 2013-04-12 LAB — COMPREHENSIVE METABOLIC PANEL
ALBUMIN: 3.3 g/dL — AB (ref 3.5–5.2)
ALK PHOS: 78 U/L (ref 39–117)
ALT: 10 U/L (ref 0–53)
AST: 16 U/L (ref 0–37)
BUN: 16 mg/dL (ref 6–23)
CO2: 27 mEq/L (ref 19–32)
CREATININE: 1.2 mg/dL (ref 0.4–1.5)
Calcium: 8.8 mg/dL (ref 8.4–10.5)
Chloride: 107 mEq/L (ref 96–112)
GFR: 64.06 mL/min (ref >60.00)
Glucose, Bld: 218 mg/dL — ABNORMAL HIGH (ref 70–99)
POTASSIUM: 4 meq/L (ref 3.5–5.1)
Sodium: 142 mEq/L (ref 135–145)
Total Bilirubin: 0.5 mg/dL (ref 0.3–1.2)
Total Protein: 6.3 g/dL (ref 6.0–8.3)

## 2013-04-12 LAB — CBC
HEMATOCRIT: 36.3 % — AB (ref 39.0–52.0)
Hemoglobin: 11.4 g/dL — ABNORMAL LOW (ref 13.0–17.0)
MCHC: 31.5 g/dL (ref 30.0–36.0)
MCV: 88 fl (ref 78.0–100.0)
PLATELETS: 174 10*3/uL (ref 150.0–400.0)
RBC: 4.13 Mil/uL — AB (ref 4.22–5.81)
RDW: 15.7 % — ABNORMAL HIGH (ref 11.5–14.6)
WBC: 6.8 10*3/uL (ref 4.5–10.5)

## 2013-04-12 LAB — HEMOGLOBIN A1C: Hgb A1c MFr Bld: 9.1 % — ABNORMAL HIGH (ref 4.6–6.5)

## 2013-04-15 ENCOUNTER — Ambulatory Visit: Payer: Medicare Other | Admitting: Endocrinology

## 2013-04-19 ENCOUNTER — Encounter: Payer: Self-pay | Admitting: Endocrinology

## 2013-04-19 ENCOUNTER — Ambulatory Visit (INDEPENDENT_AMBULATORY_CARE_PROVIDER_SITE_OTHER): Payer: Medicare Other | Admitting: Endocrinology

## 2013-04-19 VITALS — BP 122/60 | HR 68 | Temp 98.1°F | Resp 14 | Ht 70.0 in | Wt 195.8 lb

## 2013-04-19 DIAGNOSIS — IMO0001 Reserved for inherently not codable concepts without codable children: Secondary | ICD-10-CM | POA: Diagnosis not present

## 2013-04-19 DIAGNOSIS — D649 Anemia, unspecified: Secondary | ICD-10-CM

## 2013-04-19 DIAGNOSIS — E039 Hypothyroidism, unspecified: Secondary | ICD-10-CM | POA: Diagnosis not present

## 2013-04-19 DIAGNOSIS — E1165 Type 2 diabetes mellitus with hyperglycemia: Secondary | ICD-10-CM

## 2013-04-19 DIAGNOSIS — E7800 Pure hypercholesterolemia, unspecified: Secondary | ICD-10-CM | POA: Diagnosis not present

## 2013-04-19 NOTE — Progress Notes (Signed)
Patient ID: Barry Mcdaniel, male   DOB: February 16, 1933, 80 y.o.   MRN: [id number]   Reason for Appointment: Diabetes follow-up   History of Present Illness   Diagnosis: Type 2 diabetes mellitus, date of diagnosis: 1997.   He has been on basal bolus insulin regimen for a few years. His blood sugars are usually variably controlled and A1c is usually over 8% with 1 reading of 7.2.   RECENT history: He is still tending to have overall poor control of his diabetes with persistently high A1c This is despite his taking Victoza because of his tendency to eat a lot of snacks especially sweets in the afternoon  His wife thinks that he had better satiety with this but has gained back some of the weight he lost Blood sugar patterns:  Variable fasting readings ranging from 90-237, not clear what the reason for and consistency. He checks his blood sugar anywhere between 9 AM-1 PM before his first meal. He is taking his Lantus consistently  No recent sugars after meals  His blood sugars appear to be significantly better although is checking readings mostly in the mornings and midday He is generally eating a late breakfast and variable lunch for which he does not take any coverage when he has significant carbohydrates He has taken somewhat larger dose of Humalog at breakfast and supper but lab glucose indicates significantly higher reading after breakfast; he does seem to be eating unbalanced meals in the morning The insulin regimen is described as premeal rapid analog  10 am and 8-12  at supper ac bid, Lantus 46 once a day acb.   Monitors blood glucose: 1 times  a day.  Glucometer: One Touch.  Blood Glucose readings:   PREMEAL Breakfast Lunch Dinner Bedtime Overall  Glucose range:  90-237        ?    Mean/median:  160          160    Hypoglycemia frequency: none recently  Meals: variable;  For breakfast at 12 noon he will eat egg, meat, grits; occ pancackes; Sandwich occ 4 pm; Eating supper around 7 PM;  snacks hs like popsicle  Physical activity: exercise: Irregular recently.  Dietician visit: Most recent:1/14.   Wt Readings from Last 3 Encounters:  04/19/13 195 lb 12.8 oz (88.814 kg)  02/09/13 186 lb (84.369 kg)  01/18/13 191 lb 8 oz (86.864 kg)   Lab Results  Component Value Date   HGBA1C 9.1* 04/12/2013   HGBA1C 10.2* 01/13/2013   HGBA1C 8.8* 10/06/2012   Lab Results  Component Value Date   MICROALBUR 3.7* 01/13/2013   LDLCALC 53 01/13/2013   CREATININE 1.2 [id number]    Complications: Neuropathy   The microalbumin has been tested , and the result is normal in 11/14   LABS:  No visits with results within 1 Week(s) from this visit. Latest known visit with results is:  Appointment on 04/12/2013  Component Date Value Range Status  . Hemoglobin A1C 04/12/2013 9.1* 4.6 - 6.5 % Final   Glycemic Control Guidelines for People with Diabetes:Non Diabetic:  <6%Goal of Therapy: <7%Additional Action Suggested:  >8%   . Sodium 04/12/2013 142  135 - 145 mEq/L Final  . Potassium 04/12/2013 4.0  3.5 - 5.1 mEq/L Final  . Chloride 04/12/2013 107  96 - 112 mEq/L Final  . CO2 04/12/2013 27  19 - 32 mEq/L Final  . Glucose, Bld 04/12/2013 218* 70 - 99 mg/dL Final  . BUN 04/12/2013 16  6 -  23 mg/dL Final  . Creatinine, Ser 04/12/2013 1.2  0.4 - 1.5 mg/dL Final  . Total Bilirubin 04/12/2013 0.5  0.3 - 1.2 mg/dL Final  . Alkaline Phosphatase 04/12/2013 78  39 - 117 U/L Final  . AST 04/12/2013 16  0 - 37 U/L Final  . ALT 04/12/2013 10  0 - 53 U/L Final  . Total Protein 04/12/2013 6.3  6.0 - 8.3 g/dL Final  . Albumin 04/12/2013 3.3* 3.5 - 5.2 g/dL Final  . Calcium 04/12/2013 8.8  8.4 - 10.5 mg/dL Final  . GFR 04/12/2013 64.06  >60.00 mL/min Final  . WBC 04/12/2013 6.8  4.5 - 10.5 K/uL Final  . RBC 04/12/2013 4.13* 4.22 - 5.81 Mil/uL Final  . Platelets 04/12/2013 174.0  150.0 - 400.0 K/uL Final  . Hemoglobin 04/12/2013 11.4* 13.0 - 17.0 g/dL Final  . HCT 04/12/2013 36.3* 39.0 - 52.0 % Final  .  MCV 04/12/2013 88.0  78.0 - 100.0 fl Final  . MCHC 04/12/2013 31.5  30.0 - 36.0 g/dL Final  . RDW 04/12/2013 15.7* 11.5 - 14.6 % Final      Medication List       This list is accurate as of: 04/19/13 10:41 AM.  Always use your most recent med list.               aspirin EC 81 MG tablet  Take 81 mg by mouth daily.     finasteride 5 MG tablet  Commonly known as:  PROSCAR  Take 5 mg by mouth every morning.     gabapentin 300 MG capsule  Commonly known as:  NEURONTIN  Take 300 mg by mouth 4 (four) times daily.     glucose blood test strip  Commonly known as:  ONE TOUCH ULTRA TEST  Use as instructed to check blood sugars 3 times per day dx code 250.02     Hydrocodone-Acetaminophen 5-300 MG Tabs  Take one tablet by mouth three times a day as needed for pain     insulin glargine 100 UNIT/ML injection  Commonly known as:  LANTUS  Inject 0.46 mLs (46 Units total) into the skin daily before breakfast.     insulin lispro 100 UNIT/ML injection  Commonly known as:  HUMALOG  Inject 3-12 Units into the skin 3 (three) times daily before meals.     levothyroxine 100 MCG tablet  Commonly known as:  SYNTHROID, LEVOTHROID  Take 100 mcg by mouth daily before breakfast.     Liraglutide 18 MG/3ML Sopn  Commonly known as:  VICTOZA  Inject 1.2 mg into the skin daily.     losartan 25 MG tablet  Commonly known as:  COZAAR  Take 1 tablet (25 mg total) by mouth daily.     menthol-thymol Liqd  Apply 1 application topically as needed. For pain.     pioglitazone 15 MG tablet  Commonly known as:  ACTOS  Take 15 mg by mouth daily.     rosuvastatin 10 MG tablet  Commonly known as:  CRESTOR  Take 1 tablet (10 mg total) by mouth daily.     trolamine salicylate 10 % cream  Commonly known as:  ASPERCREME  Apply 1 application topically as needed. For pain.        Allergies:  Allergies  Allergen Reactions  . Penicillins Swelling    Past Medical History  Diagnosis Date  .  Hypertension     hx of hypertension no longer on meds   . Myocardial infarction  2.5 years ago   . Heart murmur   . Peripheral vascular disease   . Hypotension   . Asthma   . Recurrent upper respiratory infection (URI)     hx of bronchitis   . Diabetes mellitus   . Pneumonia     hx of pneumonia as a child   . Hypothyroidism   . Anemia     hx of year ago no problems now per pt   . Chronic kidney disease     hx of uti recently ua done 03/13/11- clear per pt   . GERD (gastroesophageal reflux disease)   . H/O hiatal hernia     hx of hiatal hernia 1983   . Arthritis     generalized   . Cancer     hx of prostate cancer     Past Surgical History  Procedure Laterality Date  . Tonsillectomy    . Other surgical history      arthroscopic right knee surgery   . Back surgery      hx of x 2   . Prostate surgery      x 2   . Other surgical history      right arm surgery due to trauma   . Shoulder open rotator cuff repair  03/20/2011    Procedure: ROTATOR CUFF REPAIR SHOULDER OPEN;  Surgeon: Tobi Bastos;  Location: [facility];  Service: Orthopedics;  Laterality: Right;  . Spine surgery      Family History  Problem Relation Age of Onset  . Heart disease Mother   . Stroke Father     Social History:  reports that he has never smoked. He has never used smokeless tobacco. He reports that he does not use illicit drugs. His alcohol history is not on file.  Review of Systems    HYPERLIPIDEMIA: Well controlled with Crestor and LDL recently 53 without side effects   DEPRESSION: Does not think he is having any depression now Did not tolerate Effexor.    He was diagnosed have had sleep apnea but he thinks symptoms are minor and has not wanted to use a CPAP    History of ? Pulmonary embolism in the past, no dyspnea  History of mild hypothyroidism,  Lab Results  Component Value Date   TSH 1.87 01/13/2013    He has had  sharp pains in his lower legs with tingling, relieved by  gabapentin but recently stopped this   He has mild valvular disease an echo   Examination:   BP 122/60  Pulse 68  Temp(Src) 98.1 F (36.7 C)  Resp 14  Ht 5\' 10"  (1.778 m)  Wt 195 lb 12.8 oz (88.814 kg)  BMI 28.09 kg/m2  SpO2 95%  Body mass index is 28.09 kg/(m^2).   No ankle edema  Assesment/Plan:   Diabetes type 2, uncontrolled  The patient's diabetes control appears to be still poor as judged by his A1c This is despite his being compliant with his Victoza an insulin He has gained weight and he is not sure why. He does not think he is eating excessive calories from sweets and high fat foods Problems identified:  Not checking blood sugars after meals  Not taking coverage for higher carbohydrate lunches such as sandwiches  Variable fasting readings possibly related to postprandial control after supper or overnight snacks  He is not able to adjust his insulin based on what he is eating at breakfast and supper  Relatively high reading after breakfast  in the lab today he had a reading of 91. He thinks he is compliant with mealtime doses before his meal  Recommendations:  Consistently checked postprandial readings after various meals  Go back to nurse educator after keeping a three-day record of food intake, before and after meal blood sugars and mealtime insulin. Discussed how to do this  Cover lunch with 6-8 units of insulin when eating sandwiches or higher carbohydrate foods  Start exercise with walking  Anemia: This is a relatively new and not clear etiology. Will start with stool Hemoccult and have more labs on the next visit  Hypothyroidism: To have TSH on next visit  Counseling time over 50% of today's 25 minute visit  Barry Mcdaniel 04/19/2013, 10:41 AM

## 2013-04-19 NOTE — Patient Instructions (Addendum)
Please check blood sugars at least half the time about 2 hours after any meal and as directed on waking up. Sugar after meals should be < 180 Please bring blood sugar monitor to each visit  Keep food/ insulin record as directed  12 humalog at breakfast

## 2013-05-03 ENCOUNTER — Other Ambulatory Visit: Payer: Self-pay | Admitting: *Deleted

## 2013-05-03 MED ORDER — GABAPENTIN 300 MG PO CAPS: 300.0000 mg | ORAL_CAPSULE | Freq: Four times a day (QID) | ORAL | Status: DC

## 2013-05-05 ENCOUNTER — Other Ambulatory Visit: Payer: Self-pay | Admitting: *Deleted

## 2013-05-05 ENCOUNTER — Telehealth: Payer: Self-pay | Admitting: *Deleted

## 2013-05-05 ENCOUNTER — Other Ambulatory Visit: Payer: Medicare Other

## 2013-05-05 NOTE — Telephone Encounter (Signed)
Patient called about Victoza, he said he stopped it last Friday because he was experiencing constipation, increased urine output, itching, back pain and tightness in his chest.

## 2013-05-05 NOTE — Telephone Encounter (Signed)
If he get sugars >200 after meals he needs to restart on 0.6mg  daily

## 2013-05-10 ENCOUNTER — Other Ambulatory Visit: Payer: Self-pay | Admitting: *Deleted

## 2013-05-10 MED ORDER — HYDROCODONE-ACETAMINOPHEN 5-300 MG PO TABS: ORAL_TABLET | ORAL | Status: DC

## 2013-05-19 ENCOUNTER — Telehealth: Payer: Self-pay | Admitting: Endocrinology

## 2013-05-19 NOTE — Telephone Encounter (Signed)
Pt was very irate with me  This pt states he has been trying to get in touch with Suanne Marker for past 4 days To my recollection I have not taken any phone messages from Mr.Rufo    Please call pt back as he stated this is very very important  Call back: [phone]  Thank You:)

## 2013-05-19 NOTE — Telephone Encounter (Signed)
Spoke with patient.

## 2013-06-17 ENCOUNTER — Other Ambulatory Visit (INDEPENDENT_AMBULATORY_CARE_PROVIDER_SITE_OTHER): Payer: Medicare Other

## 2013-06-17 DIAGNOSIS — IMO0001 Reserved for inherently not codable concepts without codable children: Secondary | ICD-10-CM | POA: Diagnosis not present

## 2013-06-17 DIAGNOSIS — E039 Hypothyroidism, unspecified: Secondary | ICD-10-CM

## 2013-06-17 DIAGNOSIS — D649 Anemia, unspecified: Secondary | ICD-10-CM | POA: Diagnosis not present

## 2013-06-17 DIAGNOSIS — E1165 Type 2 diabetes mellitus with hyperglycemia: Principal | ICD-10-CM

## 2013-06-17 LAB — COMPREHENSIVE METABOLIC PANEL
ALK PHOS: 71 U/L (ref 39–117)
ALT: 7 U/L (ref 0–53)
AST: 15 U/L (ref 0–37)
Albumin: 3.3 g/dL — ABNORMAL LOW (ref 3.5–5.2)
BUN: 13 mg/dL (ref 6–23)
CO2: 23 meq/L (ref 19–32)
Calcium: 8.7 mg/dL (ref 8.4–10.5)
Chloride: 108 mEq/L (ref 96–112)
Creatinine, Ser: 1.2 mg/dL (ref 0.4–1.5)
GFR: 64.67 mL/min (ref >60.00)
Glucose, Bld: 259 mg/dL — ABNORMAL HIGH (ref 70–99)
Potassium: 3.8 mEq/L (ref 3.5–5.1)
SODIUM: 140 meq/L (ref 135–145)
Total Bilirubin: 0.3 mg/dL (ref 0.3–1.2)
Total Protein: 6.6 g/dL (ref 6.0–8.3)

## 2013-06-17 LAB — TSH: TSH: 1.48 u[IU]/mL (ref 0.35–5.50)

## 2013-06-17 LAB — CBC
HCT: 34.8 % — ABNORMAL LOW (ref 39.0–52.0)
HEMOGLOBIN: 11.1 g/dL — AB (ref 13.0–17.0)
MCHC: 31.9 g/dL (ref 30.0–36.0)
MCV: 82.4 fl (ref 78.0–100.0)
Platelets: 221 10*3/uL (ref 150.0–400.0)
RBC: 4.23 Mil/uL (ref 4.22–5.81)
RDW: 15.6 % — AB (ref 11.5–14.6)
WBC: 6.8 10*3/uL (ref 4.5–10.5)

## 2013-06-17 LAB — IBC PANEL
Iron: 22 ug/dL — ABNORMAL LOW (ref 42–165)
Saturation Ratios: 7.7 % — ABNORMAL LOW (ref 20.0–50.0)
Transferrin: 202.9 mg/dL — ABNORMAL LOW (ref 212.0–360.0)

## 2013-06-17 LAB — URINALYSIS, ROUTINE W REFLEX MICROSCOPIC
BILIRUBIN URINE: NEGATIVE
HGB URINE DIPSTICK: NEGATIVE
Ketones, ur: NEGATIVE
LEUKOCYTES UA: NEGATIVE
Nitrite: NEGATIVE
RBC / HPF: NONE SEEN (ref 0–?)
Specific Gravity, Urine: 1.025 (ref 1.000–1.030)
Urine Glucose: >=1000 — AB
Urobilinogen, UA: 1 (ref 0.0–1.0)
pH: 6 (ref 5.0–8.0)

## 2013-06-17 LAB — T4, FREE: Free T4: 0.92 ng/dL (ref 0.60–1.60)

## 2013-06-17 LAB — MICROALBUMIN / CREATININE URINE RATIO
CREATININE, U: 193 mg/dL
MICROALB UR: 7.8 mg/dL — AB (ref 0.0–1.9)
MICROALB/CREAT RATIO: 4 mg/g (ref 0.0–30.0)

## 2013-06-22 ENCOUNTER — Ambulatory Visit (INDEPENDENT_AMBULATORY_CARE_PROVIDER_SITE_OTHER): Payer: Medicare Other | Admitting: Endocrinology

## 2013-06-22 ENCOUNTER — Encounter: Payer: Self-pay | Admitting: Endocrinology

## 2013-06-22 VITALS — BP 142/82 | HR 98 | Temp 97.9°F | Resp 16 | Ht 70.0 in | Wt 191.8 lb

## 2013-06-22 DIAGNOSIS — IMO0001 Reserved for inherently not codable concepts without codable children: Secondary | ICD-10-CM

## 2013-06-22 DIAGNOSIS — E039 Hypothyroidism, unspecified: Secondary | ICD-10-CM

## 2013-06-22 DIAGNOSIS — D509 Iron deficiency anemia, unspecified: Secondary | ICD-10-CM | POA: Diagnosis not present

## 2013-06-22 DIAGNOSIS — E7800 Pure hypercholesterolemia, unspecified: Secondary | ICD-10-CM | POA: Diagnosis not present

## 2013-06-22 DIAGNOSIS — E1165 Type 2 diabetes mellitus with hyperglycemia: Principal | ICD-10-CM

## 2013-06-22 LAB — FRUCTOSAMINE: FRUCTOSAMINE: 304 umol/L — AB (ref 190–270)

## 2013-06-22 NOTE — Patient Instructions (Addendum)
Victoza 0.6mg  daily  Take Slo-Fe iron daily  Take Humalog at least 8-14 units with meals, no doses at bedtime Lantus 48 units

## 2013-06-22 NOTE — Progress Notes (Signed)
Patient ID: Barry Mcdaniel, male   DOB: June 25, 1932, 80 y.o.   MRN: [id number]   Reason for Appointment: Diabetes follow-up   History of Present Illness   Diagnosis: Type 2 diabetes mellitus, date of diagnosis: 1997.   He has been on basal bolus insulin regimen for a few years.  His blood sugars are usually variably controlled and A1c is usually over 8%  except once was 7.2.   RECENT history: He is still continues to have overall poor control of his diabetes with persistently blood sugars and high A1c He has been instructed in the proper use of mealtime coverage and blood sugar targets several times including with nurse educator He continues to have marked increase in blood sugars after meals with average readings in the afternoons near 300 His morning readings are quite variable also but mostly high now and he does not know why Part of his problem is related to eating excessive snacks and sweets especially in the afternoon without any coverage He was seeing  improvement in blood sugars with taking Victoza on his last visit when his average blood sugar was  160 in the morning However he thinks it was causing constipation and has stopped it He has taken up to 12 units at meals but not clear if he is adjusting his dose based on his meal size He may occasionally take additional dose of  NovoLog late at night, has had one minor episode of hypoglycemia with this  Blood sugar patterns:  Variable fasting readings ranging from 144-285 although averaging about 200  Mostly high readings in the afternoons and evenings with only one good reading of 147  Blood sugars around midday he appear to be mostly near normal, not clear this is after breakfast  Readings over 250 late evening   The insulin regimen is described as premeal rapid analog  4-12 units, usually twice a day Lantus 46 once a day acb.   Monitors blood glucose: 1.8 times  a day.  Glucometer: One Touch.  Blood Glucose readings:    PREMEAL Breakfast  midday   4 PM   5-9 PM  Overall  Glucose range:  144-285   94-267   147-389   254-372    Mean/median:  200   125    290   257   Hypoglycemia frequency: Only once at 1 AM with glucose 64  Meals: variable;  For breakfast at 12 noon he will eat egg, meat, grits; occasionally pancackes; Sandwich at times at 4 pm;  Eating supper around 7 PM; snacks in the afternoon and evening sometimes ice cream or popsicle  Physical activity: exercise: Irregular recently.  Dietician visit: Most recent:1/14.   Wt Readings from Last 3 Encounters:  06/22/13 191 lb 12.8 oz (87 kg)  04/19/13 195 lb 12.8 oz (88.814 kg)  02/09/13 186 lb (84.369 kg)   Lab Results  Component Value Date   HGBA1C 9.1* 04/12/2013   HGBA1C 10.2* 01/13/2013   HGBA1C 8.8* 10/06/2012   Lab Results  Component Value Date   MICROALBUR 7.8* 06/17/2013   LDLCALC 53 01/13/2013   CREATININE 1.2 05/11/2949    Complications: Neuropathy   The microalbumin has been tested , and the result is normal in 11/14   LABS:  Appointment on 06/17/2013  Component Date Value Ref Range Status  . Sodium 06/17/2013 140  135 - 145 mEq/L Final  . Potassium 06/17/2013 3.8  3.5 - 5.1 mEq/L Final  . Chloride 06/17/2013 108  96 -  112 mEq/L Final  . CO2 06/17/2013 23  19 - 32 mEq/L Final  . Glucose, Bld 06/17/2013 259* 70 - 99 mg/dL Final  . BUN 06/17/2013 13  6 - 23 mg/dL Final  . Creatinine, Ser 06/17/2013 1.2  0.4 - 1.5 mg/dL Final  . Total Bilirubin 06/17/2013 0.3  0.3 - 1.2 mg/dL Final  . Alkaline Phosphatase 06/17/2013 71  39 - 117 U/L Final  . AST 06/17/2013 15  0 - 37 U/L Final  . ALT 06/17/2013 7  0 - 53 U/L Final  . Total Protein 06/17/2013 6.6  6.0 - 8.3 g/dL Final  . Albumin 06/17/2013 3.3* 3.5 - 5.2 g/dL Final  . Calcium 06/17/2013 8.7  8.4 - 10.5 mg/dL Final  . GFR 06/17/2013 64.67  >60.00 mL/min Final  . Fructosamine 06/17/2013 304* 190 - 270 umol/L Final  . Microalb, Ur 06/17/2013 7.8* 0.0 - 1.9 mg/dL Final  .  Creatinine,U 06/17/2013 193.0   Final  . Microalb Creat Ratio 06/17/2013 4.0  0.0 - 30.0 mg/g Final  . Color, Urine 06/17/2013 YELLOW  Yellow;Lt. Yellow Final  . APPearance 06/17/2013 CLEAR  Clear Final  . Specific Gravity, Urine 06/17/2013 1.025  1.000-1.030 Final  . pH 06/17/2013 6.0  5.0 - 8.0 Final  . Total Protein, Urine 06/17/2013 TRACE* Negative Final  . Urine Glucose 06/17/2013 >=1000* Negative Final  . Ketones, ur 06/17/2013 NEGATIVE  Negative Final  . Bilirubin Urine 06/17/2013 NEGATIVE  Negative Final  . Hgb urine dipstick 06/17/2013 NEGATIVE  Negative Final  . Urobilinogen, UA 06/17/2013 1.0  0.0 - 1.0 Final  . Leukocytes, UA 06/17/2013 NEGATIVE  Negative Final  . Nitrite 06/17/2013 NEGATIVE  Negative Final  . WBC, UA 06/17/2013 0-2/hpf  0-2/hpf Final  . RBC / HPF 06/17/2013 none seen  0-2/hpf Final  . Mucus, UA 06/17/2013 Presence of* None Final  . Squamous Epithelial / LPF 06/17/2013 Rare(0-4/hpf)  Rare(0-4/hpf) Final  . Hyaline Casts, UA 06/17/2013 Presence of* None Final  . TSH 06/17/2013 1.48  0.35 - 5.50 uIU/mL Final  . Free T4 06/17/2013 0.92  0.60 - 1.60 ng/dL Final  . WBC 06/17/2013 6.8  4.5 - 10.5 K/uL Final  . RBC 06/17/2013 4.23  4.22 - 5.81 Mil/uL Final  . Platelets 06/17/2013 221.0  150.0 - 400.0 K/uL Final  . Hemoglobin 06/17/2013 11.1* 13.0 - 17.0 g/dL Final  . HCT 06/17/2013 34.8* 39.0 - 52.0 % Final  . MCV 06/17/2013 82.4  78.0 - 100.0 fl Final  . MCHC 06/17/2013 31.9  30.0 - 36.0 g/dL Final  . RDW 06/17/2013 15.6* 11.5 - 14.6 % Final  . Iron 06/17/2013 22* 42 - 165 ug/dL Final  . Transferrin 06/17/2013 202.9* 212.0 - 360.0 mg/dL Final  . Saturation Ratios 06/17/2013 7.7* 20.0 - 50.0 % Final      Medication List       This list is accurate as of: 06/22/13 10:05 AM.  Always use your most recent med list.               aspirin EC 81 MG tablet  Take 81 mg by mouth daily.     finasteride 5 MG tablet  Commonly known as:  PROSCAR  Take 5 mg by  mouth every morning.     gabapentin 300 MG capsule  Commonly known as:  NEURONTIN  Take 1 capsule (300 mg total) by mouth 4 (four) times daily.     glucose blood test strip  Commonly known as:  ONE TOUCH  ULTRA TEST  Use as instructed to check blood sugars 3 times per day dx code 250.02     Hydrocodone-Acetaminophen 5-300 MG Tabs  Take one tablet by mouth three times a day as needed for pain     insulin glargine 100 UNIT/ML injection  Commonly known as:  LANTUS  Inject 0.46 mLs (46 Units total) into the skin daily before breakfast.     insulin lispro 100 UNIT/ML injection  Commonly known as:  HUMALOG  Inject 3-12 Units into the skin 3 (three) times daily before meals.     levothyroxine 100 MCG tablet  Commonly known as:  SYNTHROID, LEVOTHROID  Take 100 mcg by mouth daily before breakfast.     losartan 25 MG tablet  Commonly known as:  COZAAR  Take 1 tablet (25 mg total) by mouth daily.     menthol-thymol Liqd  Apply 1 application topically as needed. For pain.     pioglitazone 15 MG tablet  Commonly known as:  ACTOS  Take 15 mg by mouth daily.     rosuvastatin 10 MG tablet  Commonly known as:  CRESTOR  Take 1 tablet (10 mg total) by mouth daily.     trolamine salicylate 10 % cream  Commonly known as:  ASPERCREME  Apply 1 application topically as needed. For pain.        Allergies:  Allergies  Allergen Reactions  . Penicillins Swelling    Past Medical History  Diagnosis Date  . Hypertension     hx of hypertension no longer on meds   . Myocardial infarction     2.5 years ago   . Heart murmur   . Peripheral vascular disease   . Hypotension   . Asthma   . Recurrent upper respiratory infection (URI)     hx of bronchitis   . Diabetes mellitus   . Pneumonia     hx of pneumonia as a child   . Hypothyroidism   . Anemia     hx of year ago no problems now per pt   . Chronic kidney disease     hx of uti recently ua done 03/13/11- clear per pt   . GERD  (gastroesophageal reflux disease)   . H/O hiatal hernia     hx of hiatal hernia 1983   . Arthritis     generalized   . Cancer     hx of prostate cancer     Past Surgical History  Procedure Laterality Date  . Tonsillectomy    . Other surgical history      arthroscopic right knee surgery   . Back surgery      hx of x 2   . Prostate surgery      x 2   . Other surgical history      right arm surgery due to trauma   . Shoulder open rotator cuff repair  03/20/2011    Procedure: ROTATOR CUFF REPAIR SHOULDER OPEN;  Surgeon: Tobi Bastos;  Location: [facility];  Service: Orthopedics;  Laterality: Right;  . Spine surgery      Family History  Problem Relation Age of Onset  . Heart disease Mother   . Stroke Father     Social History:  reports that he has never smoked. He has never used smokeless tobacco. He reports that he does not use illicit drugs. His alcohol history is not on file.  Review of Systems  Anemia: He has moderate anemia and iron saturation is  low. He is due to get a colonoscopy in about 6 weeks, has history of polyps   HYPERLIPIDEMIA: Well controlled with Crestor and LDL last 53 without side effects   DEPRESSION: Does not think he is having any depression now. Did not tolerate Effexor.    He was diagnosed have had sleep apnea but he thinks symptoms are minor and has not wanted to use a CPAP    History of ? Pulmonary embolism in the past, no dyspnea  History of mild hypothyroidism, well controlled  Lab Results  Component Value Date   TSH 1.48 06/17/2013    He has had  sharp pains in his lower legs with tingling previously  Also has multiple joint pains  He has mild valvular disease an echo   Examination:   BP 142/82  Pulse 98  Temp(Src) 97.9 F (36.6 C)  Resp 16  Ht 5\' 10"  (1.778 m)  Wt 191 lb 12.8 oz (87 kg)  BMI 27.52 kg/m2  SpO2 99%  Body mass index is 27.52 kg/(m^2).   No ankle edema  Assesment/Plan:   Diabetes type 2, uncontrolled  The  patient's diabetes control appears to be still poor with home readings over 250 an A1c 9.1% He probably was benefiting from Victoza but he stopped it because he thought it was causing constipation Although he has relatively better readings in the mornings they are mostly higher also but his highest readings are in the late afternoon and evenings Most likely is difficult he is getting excessive carbohydrates and sweets and not taking coverage for these as discussed in history of present illness He thinks he is compliant with mealtime doses before his meal  Recommendations:  Increase the dose of Lantus by 2 units and more her fasting readings stay high  Review coverage for the V. go-pump. Discussed this in detail and he seems to be interested in this  He will get an appointment with the nurse educator to start the pump  Increase mealtime coverage by at least 2-4 units and also extra insulin for high carbohydrate snacks  Regular exercise with walking  Trial of 0.6 mg Victoza  Anemia: He will start iron supplement for the low-level and have his colonoscopy check as scheduled  Hypothyroidism: Continue same dose, TSH normal  Counseling time over 50% of today's 25 minute visit  [name] 06/22/2013, 10:05 AM

## 2013-06-29 ENCOUNTER — Other Ambulatory Visit: Payer: Self-pay | Admitting: *Deleted

## 2013-06-29 ENCOUNTER — Telehealth: Payer: Self-pay | Admitting: Endocrinology

## 2013-06-29 ENCOUNTER — Encounter: Payer: Medicare Other | Attending: Endocrinology | Admitting: Nutrition

## 2013-06-29 DIAGNOSIS — E119 Type 2 diabetes mellitus without complications: Secondary | ICD-10-CM | POA: Insufficient documentation

## 2013-06-29 DIAGNOSIS — Z713 Dietary counseling and surveillance: Secondary | ICD-10-CM | POA: Insufficient documentation

## 2013-06-29 MED ORDER — HYDROCODONE-ACETAMINOPHEN 5-300 MG PO TABS: ORAL_TABLET | ORAL | Status: DC

## 2013-06-29 NOTE — Patient Instructions (Signed)
Call office when he hears back from his insurance company, to let me know if he will want this V-Go

## 2013-06-29 NOTE — Telephone Encounter (Signed)
rx printed waiting for signature

## 2013-06-29 NOTE — Telephone Encounter (Signed)
Pt needs rx for hydrocodone please

## 2013-06-29 NOTE — Telephone Encounter (Signed)
Pt needs refill on his hydrocodone

## 2013-06-29 NOTE — Progress Notes (Signed)
This patient and his wife were shown the V-go.  We discussed the advantages/disadvantages.  He has called Nurse, adult care, and we are waiting on a prior authorization for this before he makes up his mind. That authorization was faxed in this AM. He will call me when he hears back from them. His diet is variable in the amounts of carbs eaten at each meal.  Some days he has no carbs for breakfast, and some days he is eating 3 frozen waffles with regular syrup.  He will need instruction on how to give the boluses for these kinds of meals.

## 2013-06-30 ENCOUNTER — Telehealth: Payer: Self-pay | Admitting: Endocrinology

## 2013-06-30 NOTE — Telephone Encounter (Signed)
Regarding PA for pt insulin has been approved on 06/29/13

## 2013-07-05 ENCOUNTER — Other Ambulatory Visit: Payer: Self-pay | Admitting: *Deleted

## 2013-07-05 MED ORDER — INSULIN GLARGINE 100 UNIT/ML ~~LOC~~ SOLN: 46.0000 [IU] | Freq: Every day | SUBCUTANEOUS | Status: DC

## 2013-07-15 ENCOUNTER — Ambulatory Visit (INDEPENDENT_AMBULATORY_CARE_PROVIDER_SITE_OTHER): Payer: Medicare Other | Admitting: Cardiovascular Disease

## 2013-07-15 ENCOUNTER — Encounter: Payer: Self-pay | Admitting: Cardiovascular Disease

## 2013-07-15 VITALS — BP 128/64 | HR 71 | Ht 70.0 in | Wt 193.8 lb

## 2013-07-15 DIAGNOSIS — E7800 Pure hypercholesterolemia, unspecified: Secondary | ICD-10-CM | POA: Diagnosis not present

## 2013-07-15 DIAGNOSIS — IMO0001 Reserved for inherently not codable concepts without codable children: Secondary | ICD-10-CM

## 2013-07-15 DIAGNOSIS — I080 Rheumatic disorders of both mitral and aortic valves: Secondary | ICD-10-CM

## 2013-07-15 DIAGNOSIS — E1165 Type 2 diabetes mellitus with hyperglycemia: Secondary | ICD-10-CM

## 2013-07-15 NOTE — Assessment & Plan Note (Signed)
Cholesterol is at goal.  Continue current dose of statin and diet Rx.  No myalgias or side effects.  F/U  LFT's in 6 months. Lab Results  Component Value Date   LDLCALC 53 01/13/2013

## 2013-07-15 NOTE — Assessment & Plan Note (Signed)
Discussed low carb diet.  Target hemoglobin A1c is 6.5 or less.  Continue current medications.  

## 2013-07-15 NOTE — Patient Instructions (Signed)
Your physician wants you to follow-up in: YEAR WITH DR NISHAN  You will receive a reminder letter in the mail two months in advance. If you don't receive a letter, please call our office to schedule the follow-up appointment.  Your physician recommends that you continue on your current medications as directed. Please refer to the Current Medication list given to you today. 

## 2013-07-15 NOTE — Assessment & Plan Note (Signed)
Soft murmurs  Consider echo in 2 years or if murmur changes symptoms ensue.

## 2013-07-15 NOTE — Progress Notes (Signed)
Patient ID: Barry Mcdaniel, male   DOB: 1932-10-10, 80 y.o.   MRN: [id number] 80 yo self referred. Saw Dr Radford Pax at [facility] in March and did not like the encounter. 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 Long standing history of irregularity in pulse No documented PAF  No palpitations. Reviewed multiple records form [facility]  Event monitor: PAC;s no afib  Stress myovue normal EF 66%  Echo moderte LVH moderate AS mean gradient 16 peak 23 AVA 1.23  He is currently asymptomatic. Gets some dizzyness in mid day BS ok Thinks its related to effexor and neurontin  No chest pain palpitations or dyspnea.   Echo 12/11/12 Study Conclusions  - Left ventricle: The cavity size was normal. Wall thickness was increased in a pattern of moderate LVH. Systolic function was normal. The estimated ejection fraction was in the range of 55% to 65%. Wall motion was normal; there were no regional wall motion abnormalities. Doppler parameters are consistent with abnormal left ventricular relaxation (grade 1 diastolic dysfunction). - Aortic valve: There was mild stenosis. - Mitral valve: Calcified annulus. Mildly thickened leaflets . Mild regurgitation. - Left atrium: The atrium was mildly dilated.  Doing well with no cardiac complaints  Wife wants to be seen as new patient  Daughter who lives in [place] with them today    ROS: Denies fever, malais, weight loss, blurry vision, decreased visual acuity, cough, sputum, SOB, hemoptysis, pleuritic pain, palpitaitons, heartburn, abdominal pain, melena, lower extremity edema, claudication, or rash.  All other systems reviewed and negative  General: Affect appropriate Healthy:  appears stated age 80: normal Neck supple with no adenopathy JVP normal no bruits no thyromegaly Lungs clear with no wheezing and good diaphragmatic motion Heart:  S1/S2 SEM murmur, no rub, gallop or click PMI normal Abdomen: benighn, BS positve, no tenderness, no AAA no bruit.  No HSM or HJR Distal pulses intact with  no bruits No edema Neuro non-focal Skin warm and dry No muscular weakness   Current Outpatient Prescriptions  Medication Sig Dispense Refill  . aspirin EC 81 MG tablet Take 81 mg by mouth daily.       . finasteride (PROSCAR) 5 MG tablet Take 5 mg by mouth every morning.       . gabapentin (NEURONTIN) 300 MG capsule Take 1 capsule (300 mg total) by mouth 4 (four) times daily.  120 capsule  5  . glucose blood (ONE TOUCH ULTRA TEST) test strip Use as instructed to check blood sugars 3 times per day dx code 250.02  100 each  5  . Hydrocodone-Acetaminophen 5-300 MG TABS Take one tablet by mouth three times a day as needed for pain  90 each  0  . insulin glargine (LANTUS) 100 UNIT/ML injection Inject 0.46 mLs (46 Units total) into the skin at bedtime.  20 mL  3  . insulin lispro (HUMALOG) 100 UNIT/ML injection Inject 3-12 Units into the skin 3 (three) times daily before meals.       . Iron-Vit C-Vit B12-Folic Acid (IRON 539 PLUS PO) Take 1 tablet by mouth.      . levothyroxine (SYNTHROID, LEVOTHROID) 100 MCG tablet Take 100 mcg by mouth daily before breakfast.      . losartan (COZAAR) 25 MG tablet Take 1 tablet (25 mg total) by mouth daily.  30 tablet  5  . menthol-thymol (ABSORBINE JR) LIQD Apply 1 application topically as needed. For pain.      . rosuvastatin (CRESTOR) 10 MG tablet Take 1 tablet (10 mg total)  by mouth daily.  30 tablet  5  . trolamine salicylate (ASPERCREME) 10 % cream Apply 1 application topically as needed. For pain.       No current facility-administered medications for this visit.    Allergies  Penicillins  Electrocardiogram:  NSR LVH   RBBB LAFB rate 71  Assessment and Plan

## 2013-07-20 DIAGNOSIS — C61 Malignant neoplasm of prostate: Secondary | ICD-10-CM | POA: Diagnosis not present

## 2013-07-23 ENCOUNTER — Encounter: Payer: Self-pay | Admitting: Endocrinology

## 2013-07-23 ENCOUNTER — Ambulatory Visit (INDEPENDENT_AMBULATORY_CARE_PROVIDER_SITE_OTHER): Payer: Medicare Other | Admitting: Endocrinology

## 2013-07-23 VITALS — BP 126/68 | HR 80 | Temp 98.0°F | Resp 16 | Ht 70.0 in | Wt 193.8 lb

## 2013-07-23 DIAGNOSIS — IMO0001 Reserved for inherently not codable concepts without codable children: Secondary | ICD-10-CM | POA: Diagnosis not present

## 2013-07-23 DIAGNOSIS — E1165 Type 2 diabetes mellitus with hyperglycemia: Principal | ICD-10-CM

## 2013-07-23 DIAGNOSIS — E7800 Pure hypercholesterolemia, unspecified: Secondary | ICD-10-CM | POA: Diagnosis not present

## 2013-07-23 DIAGNOSIS — D509 Iron deficiency anemia, unspecified: Secondary | ICD-10-CM | POA: Insufficient documentation

## 2013-07-23 NOTE — Progress Notes (Signed)
Patient ID: Barry Mcdaniel, male   DOB: 1932/10/28, 80 y.o.   MRN: [id number]   Reason for Appointment: Diabetes follow-up   History of Present Illness   Diagnosis: Type 2 diabetes mellitus, date of diagnosis: 1997.   He has been on basal bolus insulin regimen for a few years.  His blood sugars are usually variably controlled and A1c is usually over 8%  except once was 7.2.   RECENT history:  In 4/14 he was advised to look at the V -go pump but because he could not get the idea about the cost he did not pursue this and is not interested He was also asked to be more compliant with his diet as well as glucose monitoring at various times His Lantus insulin was increased by 2 units. However  may occasionally take additional dose of  Lantus late at night because he resumed that this will help his sugar when he has some sweets He was asked to try Victoza 0.6 mg again because of previous benefits and he had stopped it because of presumed side effect of constipation His blood sugars are showing the following patterns:  Recently better fasting blood sugars with only 2 readings over 190; overall still high with an average of about 137 early morning but 170 midday  Variable blood sugars in the afternoons with a range of 60-298, most likely these are before his small lunch  Blood sugars are high before supper but his last reading about 5 days ago was 145  Only one readings late in the evening of 247 at midnight, no readings after supper otherwise Mealtime insulin: He has taken up to 12 units Humalog at meals but not clear if he is adjusting his dose based on his meal size  The insulin regimen is described as premeal rapid analog  6-10acb 6-8 acl, acs   Lantus 48 once a day acb.   Monitors blood glucose: 1.8 times  a day.  Glucometer: One Touch.   Hypoglycemia frequency: Only once at 4 PM   Meals: variable;  For breakfast at 9-12 noon he will eat egg, meat, grits; occasionally pancackes; Sandwich  at times at 4 pm;  Eating supper around 7 PM; snacks in the afternoon and evening sometimes ice cream or popsicle  Physical activity: exercise: recent walking  Dietician visit: Most recent:1/14.   Wt Readings from Last 3 Encounters:  07/23/13 193 lb 12.8 oz (87.907 kg)  07/15/13 193 lb 12.8 oz (87.907 kg)  06/22/13 191 lb 12.8 oz (87 kg)   Lab Results  Component Value Date   HGBA1C 9.1* 04/12/2013   HGBA1C 10.2* 01/13/2013   HGBA1C 8.8* 10/06/2012   Lab Results  Component Value Date   MICROALBUR 7.8* 06/17/2013   LDLCALC 53 01/13/2013   CREATININE 1.2 4/0/9811    Complications: Neuropathy       Medication List       This list is accurate as of: 07/23/13 10:24 AM.  Always use your most recent med list.               aspirin EC 81 MG tablet  Take 81 mg by mouth daily.     finasteride 5 MG tablet  Commonly known as:  PROSCAR  Take 5 mg by mouth every morning.     gabapentin 300 MG capsule  Commonly known as:  NEURONTIN  Take 1 capsule (300 mg total) by mouth 4 (four) times daily.     glucose blood test strip  Commonly known as:  ONE TOUCH ULTRA TEST  Use as instructed to check blood sugars 3 times per day dx code 250.02     Hydrocodone-Acetaminophen 5-300 MG Tabs  Take one tablet by mouth three times a day as needed for pain     insulin glargine 100 UNIT/ML injection  Commonly known as:  LANTUS  Inject 48 Units into the skin at bedtime.     insulin lispro 100 UNIT/ML injection  Commonly known as:  HUMALOG  Inject 3-12 Units into the skin 3 (three) times daily before meals.     IRON 100 PLUS PO  Take 1 tablet by mouth.     levothyroxine 100 MCG tablet  Commonly known as:  SYNTHROID, LEVOTHROID  Take 100 mcg by mouth daily before breakfast.     losartan 25 MG tablet  Commonly known as:  COZAAR  Take 1 tablet (25 mg total) by mouth daily.     menthol-thymol Liqd  Apply 1 application topically as needed. For pain.     rosuvastatin 10 MG tablet  Commonly  known as:  CRESTOR  Take 1 tablet (10 mg total) by mouth daily.     trolamine salicylate 10 % cream  Commonly known as:  ASPERCREME  Apply 1 application topically as needed. For pain.        Allergies:  Allergies  Allergen Reactions  . Penicillins Swelling    Past Medical History  Diagnosis Date  . Hypertension     hx of hypertension no longer on meds   . Myocardial infarction     2.5 years ago   . Heart murmur   . Peripheral vascular disease   . Hypotension   . Asthma   . Recurrent upper respiratory infection (URI)     hx of bronchitis   . Diabetes mellitus   . Pneumonia     hx of pneumonia as a child   . Hypothyroidism   . Anemia     hx of year ago no problems now per pt   . Chronic kidney disease     hx of uti recently ua done 03/13/11- clear per pt   . GERD (gastroesophageal reflux disease)   . H/O hiatal hernia     hx of hiatal hernia 1983   . Arthritis     generalized   . Cancer     hx of prostate cancer     Past Surgical History  Procedure Laterality Date  . Tonsillectomy    . Other surgical history      arthroscopic right knee surgery   . Back surgery      hx of x 2   . Prostate surgery      x 2   . Other surgical history      right arm surgery due to trauma   . Shoulder open rotator cuff repair  03/20/2011    Procedure: ROTATOR CUFF REPAIR SHOULDER OPEN;  Surgeon: Tobi Bastos;  Location: [facility];  Service: Orthopedics;  Laterality: Right;  . Spine surgery      Family History  Problem Relation Age of Onset  . Heart disease Mother   . Stroke Father     Social History:  reports that he has never smoked. He has never used smokeless tobacco. He reports that he does not use illicit drugs. His alcohol history is not on file.  Review of Systems  Anemia: He has had anemia and iron saturation is low. He has been recommended  iron supplements and colonoscopy  Lab Results  Component Value Date   WBC 6.8 06/17/2013   HGB 11.1* 06/17/2013   HCT 34.8*  06/17/2013   MCV 82.4 06/17/2013   PLT 221.0 06/17/2013     HYPERLIPIDEMIA: Well controlled with Crestor   Lab Results  Component Value Date   CHOL 124 01/13/2013   HDL 50.90 01/13/2013   LDLCALC 53 01/13/2013   TRIG 100.0 01/13/2013   CHOLHDL 2 01/13/2013     He was diagnosed have had sleep apnea but he thinks symptoms are minor and has not wanted to use a CPAP    History of ? Pulmonary embolism in the past, no dyspnea  History of mild hypothyroidism, well controlled  Lab Results  Component Value Date   TSH 1.48 06/17/2013    He has had  sharp pains in his lower legs with tingling previously  He has multiple joint pains and takes 1-3 Vicodin daily.  Has had some constipation periodically   He has mild valvular disease an echo   Examination:   BP 126/68  Pulse 80  Temp(Src) 98 F (36.7 C)  Resp 16  Ht 5\' 10"  (1.778 m)  Wt 193 lb 12.8 oz (87.907 kg)  BMI 27.81 kg/m2  SpO2 97%  Body mass index is 27.81 kg/(m^2).   No ankle edema  Assesment/Plan:   Diabetes type 2, uncontrolled  The patient's diabetes control appears to be  inadequate with home average recently 178 although most of these readings are before meals He has had some improvement since his last visit with trying to do a little better with diet and possibly more compliance with mealtime insulin As discussed in history of present illness he still has somewhat variable readings before his first meal and also some significantly high readings after meals or snacks over 200 He does not want to pursue the V. go pump at this time because of cost  Recommendations:  No change in Lantus but do not take any in the evening   Increase mealtime coverage by at least 2-4 unitsbefore larger meals  More blood sugars after supper    Take 4-6 units insulin for high carbohydrate snacksIncluding at night   Regular exercise with walking  Trial of 0.6 mg Victoza   Barry Mcdaniel 07/23/2013, 10:24 AM

## 2013-07-23 NOTE — Patient Instructions (Addendum)
Please check blood sugars at least half the time about 2 hours after any meal and as directed on waking up. Please bring blood sugar monitor to each visit  Take extra Humalog for snacks  Victoza 0.6

## 2013-07-27 DIAGNOSIS — C61 Malignant neoplasm of prostate: Secondary | ICD-10-CM | POA: Diagnosis not present

## 2013-07-27 DIAGNOSIS — R972 Elevated prostate specific antigen [PSA]: Secondary | ICD-10-CM | POA: Diagnosis not present

## 2013-07-27 DIAGNOSIS — E291 Testicular hypofunction: Secondary | ICD-10-CM | POA: Diagnosis not present

## 2013-08-10 DIAGNOSIS — R195 Other fecal abnormalities: Secondary | ICD-10-CM | POA: Diagnosis not present

## 2013-08-10 DIAGNOSIS — Z86010 Personal history of colonic polyps: Secondary | ICD-10-CM | POA: Diagnosis not present

## 2013-08-10 DIAGNOSIS — Z09 Encounter for follow-up examination after completed treatment for conditions other than malignant neoplasm: Secondary | ICD-10-CM | POA: Diagnosis not present

## 2013-08-10 DIAGNOSIS — R198 Other specified symptoms and signs involving the digestive system and abdomen: Secondary | ICD-10-CM | POA: Diagnosis not present

## 2013-08-10 DIAGNOSIS — K648 Other hemorrhoids: Secondary | ICD-10-CM | POA: Diagnosis not present

## 2013-08-10 DIAGNOSIS — D126 Benign neoplasm of colon, unspecified: Secondary | ICD-10-CM | POA: Diagnosis not present

## 2013-08-10 DIAGNOSIS — K5730 Diverticulosis of large intestine without perforation or abscess without bleeding: Secondary | ICD-10-CM | POA: Diagnosis not present

## 2013-08-10 DIAGNOSIS — D649 Anemia, unspecified: Secondary | ICD-10-CM | POA: Diagnosis not present

## 2013-08-17 DIAGNOSIS — C61 Malignant neoplasm of prostate: Secondary | ICD-10-CM | POA: Diagnosis not present

## 2013-08-18 ENCOUNTER — Encounter: Payer: Self-pay | Admitting: *Deleted

## 2013-08-18 ENCOUNTER — Telehealth: Payer: Self-pay | Admitting: *Deleted

## 2013-08-18 ENCOUNTER — Other Ambulatory Visit: Payer: Self-pay | Admitting: *Deleted

## 2013-08-18 DIAGNOSIS — Z79899 Other long term (current) drug therapy: Secondary | ICD-10-CM | POA: Diagnosis not present

## 2013-08-18 MED ORDER — HYDROCODONE-ACETAMINOPHEN 5-300 MG PO TABS: ORAL_TABLET | ORAL | Status: DC

## 2013-08-18 NOTE — Telephone Encounter (Signed)
She spoke with nurse about RX refill hydrocodone 300 mg tablets

## 2013-08-30 DIAGNOSIS — D649 Anemia, unspecified: Secondary | ICD-10-CM | POA: Diagnosis not present

## 2013-08-31 DIAGNOSIS — K5900 Constipation, unspecified: Secondary | ICD-10-CM | POA: Diagnosis not present

## 2013-08-31 DIAGNOSIS — K921 Melena: Secondary | ICD-10-CM | POA: Diagnosis not present

## 2013-09-06 DIAGNOSIS — M67919 Unspecified disorder of synovium and tendon, unspecified shoulder: Secondary | ICD-10-CM | POA: Diagnosis not present

## 2013-09-06 DIAGNOSIS — M5137 Other intervertebral disc degeneration, lumbosacral region: Secondary | ICD-10-CM | POA: Diagnosis not present

## 2013-09-06 DIAGNOSIS — M719 Bursopathy, unspecified: Secondary | ICD-10-CM | POA: Diagnosis not present

## 2013-09-06 DIAGNOSIS — M542 Cervicalgia: Secondary | ICD-10-CM | POA: Diagnosis not present

## 2013-09-17 ENCOUNTER — Other Ambulatory Visit (INDEPENDENT_AMBULATORY_CARE_PROVIDER_SITE_OTHER): Payer: Medicare Other

## 2013-09-17 DIAGNOSIS — IMO0001 Reserved for inherently not codable concepts without codable children: Secondary | ICD-10-CM | POA: Diagnosis not present

## 2013-09-17 DIAGNOSIS — D509 Iron deficiency anemia, unspecified: Secondary | ICD-10-CM | POA: Diagnosis not present

## 2013-09-17 DIAGNOSIS — E1165 Type 2 diabetes mellitus with hyperglycemia: Principal | ICD-10-CM

## 2013-09-17 DIAGNOSIS — E7800 Pure hypercholesterolemia, unspecified: Secondary | ICD-10-CM

## 2013-09-17 LAB — LIPID PANEL
CHOLESTEROL: 139 mg/dL (ref 0–200)
HDL: 53.1 mg/dL (ref >39.00)
LDL CALC: 61 mg/dL (ref 0–99)
NonHDL: 85.9
Total CHOL/HDL Ratio: 3
Triglycerides: 127 mg/dL (ref 0.0–149.0)
VLDL: 25.4 mg/dL (ref 0.0–40.0)

## 2013-09-17 LAB — CBC
HCT: 44.2 % (ref 39.0–52.0)
Hemoglobin: 14.4 g/dL (ref 13.0–17.0)
MCHC: 32.6 g/dL (ref 30.0–36.0)
MCV: 85.6 fl (ref 78.0–100.0)
Platelets: 167 10*3/uL (ref 150.0–400.0)
RBC: 5.16 Mil/uL (ref 4.22–5.81)
RDW: 17.9 % — AB (ref 11.5–15.5)
WBC: 9.1 10*3/uL (ref 4.0–10.5)

## 2013-09-17 LAB — BASIC METABOLIC PANEL
BUN: 29 mg/dL — ABNORMAL HIGH (ref 6–23)
CO2: 24 mEq/L (ref 19–32)
CREATININE: 1.2 mg/dL (ref 0.4–1.5)
Calcium: 9.3 mg/dL (ref 8.4–10.5)
Chloride: 101 mEq/L (ref 96–112)
GFR: 59.84 mL/min — AB (ref >60.00)
Glucose, Bld: 411 mg/dL — ABNORMAL HIGH (ref 70–99)
Potassium: 4.3 mEq/L (ref 3.5–5.1)
Sodium: 134 mEq/L — ABNORMAL LOW (ref 135–145)

## 2013-09-17 LAB — HEMOGLOBIN A1C: Hgb A1c MFr Bld: 9.1 % — ABNORMAL HIGH (ref 4.6–6.5)

## 2013-09-21 DIAGNOSIS — H26499 Other secondary cataract, unspecified eye: Secondary | ICD-10-CM | POA: Diagnosis not present

## 2013-09-21 DIAGNOSIS — H04129 Dry eye syndrome of unspecified lacrimal gland: Secondary | ICD-10-CM | POA: Diagnosis not present

## 2013-09-22 ENCOUNTER — Encounter: Payer: Self-pay | Admitting: Endocrinology

## 2013-09-22 ENCOUNTER — Ambulatory Visit (INDEPENDENT_AMBULATORY_CARE_PROVIDER_SITE_OTHER): Payer: Medicare Other | Admitting: Endocrinology

## 2013-09-22 VITALS — BP 135/80 | HR 75 | Temp 98.2°F | Resp 16 | Ht 70.0 in | Wt 183.0 lb

## 2013-09-22 DIAGNOSIS — E1165 Type 2 diabetes mellitus with hyperglycemia: Principal | ICD-10-CM

## 2013-09-22 DIAGNOSIS — E7800 Pure hypercholesterolemia, unspecified: Secondary | ICD-10-CM | POA: Diagnosis not present

## 2013-09-22 DIAGNOSIS — R059 Cough, unspecified: Secondary | ICD-10-CM

## 2013-09-22 DIAGNOSIS — R05 Cough: Secondary | ICD-10-CM

## 2013-09-22 DIAGNOSIS — E039 Hypothyroidism, unspecified: Secondary | ICD-10-CM

## 2013-09-22 DIAGNOSIS — IMO0001 Reserved for inherently not codable concepts without codable children: Secondary | ICD-10-CM

## 2013-09-22 NOTE — Patient Instructions (Addendum)
Take 50 Lantus daily and if am sugar still > 130 in am go to 52  Humlog upto 12 units for dinner BEFORE eating so that sugar after meals are UNDER 180  Check sugar 2-3 times a day for the week before next visit

## 2013-09-22 NOTE — Progress Notes (Signed)
Patient ID: Barry Officer Sr., male   DOB: [birth year], 80 y.o.   MRN: [id number]   Reason for Appointment: Diabetes follow-up   History of Present Illness   Diagnosis: Type 2 diabetes mellitus, date of diagnosis: 1997.   He has been on basal bolus insulin regimen for a few years.  His blood sugars are usually variably controlled and A1c is usually over 8%  except once was 7.2.   RECENT history:  In 4/14 he was advised to look at the V -go pump but is not interested He was also asked to be more compliant with his diet as well as glucose monitoring at various times He was asked to try Victoza 0.6 mg again because of previous benefits and he had stopped it because of presumed side effect of constipation His blood sugars are still poorly controlled with A1c over 9% This is partly related to his getting a steroid injection about 10 days ago However he still has sporadic readings over 300 at home at various times and his compliance is suboptimal in several areas He thinks that he may be doing a little better with snacks like ice cream and has lost some weight.  His blood sugars are showing the following patterns:  Still has mostly high fasting readings with the only reading near normal of 138 yesterday and as high as 364  Blood sugars are relatively good there are lunchtime except after he had steroids  Has only a couple readings before supper, not consistent  Most of his readings late in the evening are high and at least over 200  Recent average blood sugar is 234  No hypoglycemia Mealtime insulin: He has taken variable doses of Humalog at meals but not clear if he is adjusting his dose based on his meal size However as seen yesterday evening he has sometimes missed his insulin at mealtimes despite his wife helping him to remember Basal insulin: He has not increased the dose as directed previously  The insulin regimen is described as premeal rapid analog  6-10 acb 6-8 acl, acs   Lantus  48 once a day acb.   Monitors blood glucose: 1.8 times  a day.  Glucometer: One Touch.   PREMEAL Breakfast Lunch Dinner Bedtime Overall  Glucose range:  138-364   108-430   155-347   235-335    Mean/median:      234/209    Meals: variable;  For breakfast at 9-12 noon he will eat egg, meat, grits; occasionally pancackes; Sandwich at times at 4 pm;  Eating supper around 7 PM; snacks in the afternoon and evening sometimes high carbohydrate foods  Physical activity: exercise: Only a little walking recently  Dietician visit: Most recent:1/14.   Wt Readings from Last 3 Encounters:  09/22/13 183 lb (83.008 kg)  07/23/13 193 lb 12.8 oz (87.907 kg)  07/15/13 193 lb 12.8 oz (87.907 kg)   Lab Results  Component Value Date   HGBA1C 9.1* 09/17/2013   HGBA1C 9.1* 04/12/2013   HGBA1C 10.2* 01/13/2013   Lab Results  Component Value Date   MICROALBUR 7.8* 06/17/2013   LDLCALC 61 09/17/2013   CREATININE 1.2 7/84/6962    Complications: Neuropathy       Medication List       This list is accurate as of: 09/22/13 10:25 AM.  Always use your most recent med list.               aspirin EC 81 MG tablet  Take  81 mg by mouth daily.     finasteride 5 MG tablet  Commonly known as:  PROSCAR  Take 5 mg by mouth every morning.     gabapentin 300 MG capsule  Commonly known as:  NEURONTIN  Take 1 capsule (300 mg total) by mouth 4 (four) times daily.     glucose blood test strip  Commonly known as:  ONE TOUCH ULTRA TEST  Use as instructed to check blood sugars 3 times per day dx code 250.02     Hydrocodone-Acetaminophen 5-300 MG Tabs  Take one tablet by mouth three times a day as needed for pain     insulin glargine 100 UNIT/ML injection  Commonly known as:  LANTUS  Inject 48 Units into the skin at bedtime.     insulin lispro 100 UNIT/ML injection  Commonly known as:  HUMALOG  Inject 3-12 Units into the skin 3 (three) times daily before meals.     IRON 100 PLUS PO  Take 1 tablet by  mouth.     levothyroxine 100 MCG tablet  Commonly known as:  SYNTHROID, LEVOTHROID  Take 100 mcg by mouth daily before breakfast.     losartan 25 MG tablet  Commonly known as:  COZAAR  Take 1 tablet (25 mg total) by mouth daily.     menthol-thymol Liqd  Apply 1 application topically as needed. For pain.     rosuvastatin 10 MG tablet  Commonly known as:  CRESTOR  Take 1 tablet (10 mg total) by mouth daily.     trolamine salicylate 10 % cream  Commonly known as:  ASPERCREME  Apply 1 application topically as needed. For pain.        Allergies:  Allergies  Allergen Reactions  . Penicillins Swelling    Past Medical History  Diagnosis Date  . Hypertension     hx of hypertension no longer on meds   . Myocardial infarction     2.5 years ago   . Heart murmur   . Peripheral vascular disease   . Hypotension   . Asthma   . Recurrent upper respiratory infection (URI)     hx of bronchitis   . Diabetes mellitus   . Pneumonia     hx of pneumonia as a child   . Hypothyroidism   . Anemia     hx of year ago no problems now per pt   . Chronic kidney disease     hx of uti recently ua done 03/13/11- clear per pt   . GERD (gastroesophageal reflux disease)   . H/O hiatal hernia     hx of hiatal hernia 1983   . Arthritis     generalized   . Cancer     hx of prostate cancer     Past Surgical History  Procedure Laterality Date  . Tonsillectomy    . Other surgical history      arthroscopic right knee surgery   . Back surgery      hx of x 2   . Prostate surgery      x 2   . Other surgical history      right arm surgery due to trauma   . Shoulder open rotator cuff repair  03/20/2011    Procedure: ROTATOR CUFF REPAIR SHOULDER OPEN;  Surgeon: Tobi Bastos;  Location: [facility];  Service: Orthopedics;  Laterality: Right;  . Spine surgery      Family History  Problem Relation Age of Onset  .  Heart disease Mother   . Stroke Father     Social History:  reports that he has  never smoked. He has never used smokeless tobacco. He reports that he does not use illicit drugs. His alcohol history is not on file.  Review of Systems  Complaining of chronic cough with grayish sputum. Has not benefited from inhalers given previously. Previous chest x-rays have been normal . Anemia: He has had anemia and iron saturation is low. He has been given iron supplements and colonoscopy showed one polyp Hemoglobin is back to normal  Lab Results  Component Value Date   WBC 9.1 09/17/2013   HGB 14.4 09/17/2013   HCT 44.2 09/17/2013   MCV 85.6 09/17/2013   PLT 167.0 09/17/2013     HYPERLIPIDEMIA: Well controlled with Crestor   Lab Results  Component Value Date   CHOL 139 09/17/2013   HDL 53.10 09/17/2013   LDLCALC 61 09/17/2013   TRIG 127.0 09/17/2013   CHOLHDL 3 09/17/2013     He was diagnosed have had sleep apnea but he thinks symptoms are minor and has not wanted to use a CPAP    History of ? Pulmonary embolism in the past, no dyspnea  He is asking about transient momentary sharp pains radiating from his neck down to his lateral chest that occurred one night. No associated symptoms  History of mild hypothyroidism, well controlled  Lab Results  Component Value Date   TSH 1.48 06/17/2013    He has had less low back pain since epidural steroid  He has multiple joint pains and takes oxycodone or Vicodin at times.  Has had some constipation periodically   He has mild valvular disease an echo  Minimal hypertension, on 25 mg Cozaar   Examination:   BP 135/80  Pulse 75  Temp(Src) 98.2 F (36.8 C)  Resp 16  Ht 5\' 10"  (1.778 m)  Wt 183 lb (83.008 kg)  BMI 26.26 kg/m2  SpO2 96%  Body mass index is 26.26 kg/(m^2).   Not indicated  Assesment/Plan:   Diabetes type 2, uncontrolled  The patient's diabetes control appears to be  inadequate especially with recent high readings from epidural steroid As discussed in history of present illness is still not compliant  with his insulin regimen as well as glucose monitoring Occasionally has good readings when he is compliant with his mealtime insulin and diet He has done a little better with cutting out sweets in his diet Most likely has lost weight because of hyperglycemia but also reducing high-fat snacks  He still does not want to pursue the V. go pump    Recommendations:  Increase Lantus by 2 units  Increase mealtime coverage by at least 2-4 units before evening meals especially if eating more carbohydrate or larger meal  More blood sugars after supper and also in the morning to help adjust both basal and bolus insulin   Regular exercise as tolerated with walking  Consider adding Invokana for better control  Chronic cough with mucopurulent expectoration: Pulmonary consultation done  [facility] 09/22/2013, 10:25 AM

## 2013-10-12 ENCOUNTER — Ambulatory Visit (INDEPENDENT_AMBULATORY_CARE_PROVIDER_SITE_OTHER): Payer: Medicare Other | Admitting: Internal Medicine

## 2013-10-12 ENCOUNTER — Encounter: Payer: Self-pay | Admitting: Internal Medicine

## 2013-10-12 ENCOUNTER — Ambulatory Visit (INDEPENDENT_AMBULATORY_CARE_PROVIDER_SITE_OTHER)
Admission: RE | Admit: 2013-10-12 | Discharge: 2013-10-12 | Disposition: A | Discharge status: Home / Self Care | Payer: Medicare Other | Source: Ambulatory Visit | Attending: Internal Medicine | Admitting: Internal Medicine

## 2013-10-12 VITALS — BP 126/74 | HR 60 | Temp 98.2°F | Ht 70.0 in | Wt 183.6 lb

## 2013-10-12 DIAGNOSIS — J45991 Cough variant asthma: Secondary | ICD-10-CM | POA: Diagnosis not present

## 2013-10-12 DIAGNOSIS — J438 Other emphysema: Secondary | ICD-10-CM | POA: Diagnosis not present

## 2013-10-12 MED ORDER — LEVOFLOXACIN 500 MG PO TABS: 500.0000 mg | ORAL_TABLET | Freq: Every day | ORAL | Status: DC

## 2013-10-12 MED ORDER — PREDNISONE 10 MG PO TABS: ORAL_TABLET | ORAL | Status: DC

## 2013-10-12 MED ORDER — FAMOTIDINE 20 MG PO TABS: ORAL_TABLET | ORAL | Status: DC

## 2013-10-12 MED ORDER — PANTOPRAZOLE SODIUM 40 MG PO TBEC: 40.0000 mg | DELAYED_RELEASE_TABLET | Freq: Every day | ORAL | Status: DC

## 2013-10-12 NOTE — Progress Notes (Signed)
Subjective:    Patient ID: Barry DEARDEN Sr., male    DOB: 1932/05/06  MRN: [id number]  HPI  62 yowm never smoker with coughing daily x all his life but did not affect athletics through HS not better with inhalers in past  and worse since 2014 so referred by Dr Dwyane Dee 10/12/2013 to pulmonary clinic for cough eval.    10/12/2013 1st [facility] office visit/ Dezyrae Kensinger  Chief Complaint  Patient presents with  . Pulmonary Consult    Referred per Dr. [name]. Pt c/o cough "ever since I was born".  Cough is prod with minimal dark grey sputum. Cough seems worse at night.  He also c/o CP "when I exercise too much"- sharp and dull pain.   extremely difficult hx, very evasive. His dark grey mucus was coughed up during the exam and is clearly quite yellow, not even close to a gray shade, thick and able a tsp after a coughing fit  Kouffman Reflux v Neurogenic Cough Differentiator Reflux Comments  Do you awaken from a sound sleep coughing violently?                            With trouble breathing? Yes   Do you have choking episodes when you cannot  Get enough air, gasping for air ?              no   Do you usually cough when you lie down into  The bed, or when you just lie down to rest ?                          no   Do you usually cough after meals or eating?         No    Do you cough when (or after) you bend over?    no   GERD SCORE     Kouffman Reflux v Neurogenic Cough Differentiator Neurogenic   Do you more-or-less cough all day long? yes   Does change of temperature make you cough? yes   Does laughing or chuckling cause you to cough? no   Do fumes (perfume, automobile fumes, burned  Toast, etc.,) cause you to cough ?      grease   Does speaking, singing, or talking on the phone cause you to cough   ?               No    Neurogenic/Airway score     No obvious other patterns in day to day or daytime variabilty or assoc classically exertiona/pleuritic cp or chest tightness, subjective  wheeze overt sinus or hb symptoms. No unusual exp hx or h/o childhood pna or knowledge of premature birth.   . Also denies any obvious fluctuation of symptoms with weather or environmental changes or other aggravating or alleviating factors except as outlined above   Current Medications, Allergies, Complete Past Medical History, Past Surgical History, Family History, and Social History were reviewed in [employer] record.           Review of Systems  Constitutional: Positive for appetite change and unexpected weight change. Negative for fever, chills and activity change.  HENT: Positive for sneezing and sore throat. Negative for congestion, dental problem, postnasal drip, rhinorrhea, trouble swallowing and voice change.   Eyes: Negative for visual disturbance.  Respiratory: Positive for cough. Negative for choking and shortness  of breath.   Cardiovascular: Negative for chest pain and leg swelling.  Gastrointestinal: Positive for abdominal pain. Negative for nausea and vomiting.  Genitourinary: Negative for difficulty urinating.  Musculoskeletal: Positive for arthralgias.  Skin: Negative for rash.  Psychiatric/Behavioral: Negative for behavioral problems and confusion.       Objective:   Physical Exam  amb wm Patient failed to answer a single question asked in a straightforward manner, tending to go off on tangents or answer questions with ambiguous medical terms or diagnoses and seemed aggravated  when asked the same question more than once for clarification.   Wt Readings from Last 3 Encounters:  10/12/13 183 lb 9.6 oz (83.28 kg)  09/22/13 183 lb (83.008 kg)  07/23/13 193 lb 12.8 oz (87.907 kg)      HEENT: nl dentition, turbinates, and orophanx. Nl external ear canals without cough reflex   NECK :  without JVD/Nodes/TM/ nl carotid upstrokes bilaterally   LUNGS: no acc muscle use, clear to A and P bilaterally without cough on insp or exp  maneuvers   CV:  RRR  no s3 or murmur or increase in P2, no edema   ABD:  soft and nontender with nl excursion in the supine position. No bruits or organomegaly, bowel sounds nl  MS:  warm without deformities, calf tenderness, cyanosis or clubbing  SKIN: warm and dry without lesions    NEURO:  alert, approp, no deficits     CXR  10/12/2013 :  Underlying emphysema. Mild scarring in the bases. No edema or consolidation.        Assessment & Plan:

## 2013-10-12 NOTE — Patient Instructions (Addendum)
Levaquin 500 mg daily x 10 days and then do sinus CT  Please see patient coordinator before you leave today  to schedule Sinus CT in 10 days   Continue nasacort twice daily   Prednisone 10 mg take  4 each am x 2 days,   2 each am x 2 days,  1 each am x 2 days and stop   Pantoprazole (protonix) 40 mg   Take 30-60 min before first meal of the day and Pepcid 20 mg one bedtime until return to office - this is the best way to tell whether stomach acid is contributing to your problem.    GERD (REFLUX)  is an extremely common cause of respiratory symptoms, many times with no significant heartburn at all.    It can be treated with medication, but also with lifestyle changes including avoidance of late meals, excessive alcohol, smoking cessation, and avoid fatty foods, chocolate, peppermint, colas, red wine, and acidic juices such as orange juice.  NO MINT OR MENTHOL PRODUCTS SO NO COUGH DROPS  USE SUGARLESS CANDY INSTEAD (jolley ranchers or Stover's)  NO OIL BASED VITAMINS - use powdered substitutes.  Please schedule a follow up office visit in 4 weeks, sooner if needed

## 2013-10-12 NOTE — Progress Notes (Signed)
Quick Note:  ATC, NA and no option to leave a msg, WCB ______ 

## 2013-10-12 NOTE — Assessment & Plan Note (Addendum)
The most common causes of chronic cough in immunocompetent adults include the following: upper airway cough syndrome (UACS), previously referred to as postnasal drip syndrome (PNDS), which is caused by variety of rhinosinus conditions; (2) asthma; (3) GERD; (4) chronic bronchitis from cigarette smoking or other inhaled environmental irritants; (5) nonasthmatic eosinophilic bronchitis; and (6) bronchiectasis.   These conditions, singly or in combination, have accounted for up to 94% of the causes of chronic cough in prospective studies.   Other conditions have constituted no >6% of the causes in prospective studies These have included bronchogenic carcinoma, chronic interstitial pneumonia, sarcoidosis, left ventricular failure, ACEI-induced cough, and aspiration from a condition associated with pharyngeal dysfunction.    Chronic cough is often simultaneously caused by more than one condition. A single cause has been found from 38 to 82% of the time, multiple causes from 18 to 62%. Multiply caused cough has been the result of three diseases up to 42% of the time.       Based on hx and exam, this is most likely:  Cough variant asthma vs   Upper airway cough syndrome, so named because it's frequently impossible to sort out how much is  CR/sinusitis with freq throat clearing (which can be related to primary GERD)   vs  causing  secondary (" extra esophageal")  GERD from wide swings in gastric pressure that occur with throat clearing, often  promoting self use of mint and menthol lozenges that reduce the lower esophageal sphincter tone and exacerbate the problem further in a cyclical fashion.   These are the same pts (now being labeled as having "irritable larynx syndrome" by some cough centers) who not infrequently have a history of having failed to tolerate ace inhibitors,  dry powder inhalers or biphosphonates or report having atypical reflux symptoms that don't respond to standard doses of PPI , and are  easily confused as having aecopd or asthma flares by even experienced allergists/ pulmonologists.   The first step is to maximize acid suppression/ GERD rx  and treat for possible underlying sinus dz x 10 days then sinus CT next step but for now since no better with inhalers in past will hold off on empirical rx for asthma for now   See instructions for specific recommendations which were reviewed directly with the patient who was given a copy with highlighter outlining the key components.

## 2013-10-15 NOTE — Progress Notes (Signed)
Quick Note:  Spoke with pt and notified of results per Dr. Wert. Pt verbalized understanding and denied any questions.  ______ 

## 2013-10-25 ENCOUNTER — Other Ambulatory Visit: Payer: Medicare Other

## 2013-10-27 ENCOUNTER — Encounter: Payer: Self-pay | Admitting: Endocrinology

## 2013-10-28 ENCOUNTER — Ambulatory Visit (INDEPENDENT_AMBULATORY_CARE_PROVIDER_SITE_OTHER)
Admission: RE | Admit: 2013-10-28 | Discharge: 2013-10-28 | Disposition: A | Discharge status: Home / Self Care | Payer: Medicare Other | Source: Ambulatory Visit | Attending: Internal Medicine | Admitting: Internal Medicine

## 2013-10-28 DIAGNOSIS — R05 Cough: Secondary | ICD-10-CM | POA: Diagnosis not present

## 2013-10-28 DIAGNOSIS — R059 Cough, unspecified: Secondary | ICD-10-CM | POA: Diagnosis not present

## 2013-10-28 DIAGNOSIS — J45991 Cough variant asthma: Secondary | ICD-10-CM | POA: Diagnosis not present

## 2013-10-29 ENCOUNTER — Encounter: Payer: Self-pay | Admitting: Internal Medicine

## 2013-10-30 ENCOUNTER — Other Ambulatory Visit: Payer: Self-pay | Admitting: Endocrinology

## 2013-11-01 ENCOUNTER — Telehealth: Payer: Self-pay | Admitting: Endocrinology

## 2013-11-01 ENCOUNTER — Telehealth: Payer: Self-pay | Admitting: Internal Medicine

## 2013-11-01 NOTE — Telephone Encounter (Signed)
Spoke with patient- he is aware that MW has not signed off on results as of yet but I will send to MW to advise on results in AM.   MW please advise. Thanks.

## 2013-11-01 NOTE — Telephone Encounter (Signed)
He is referring to his CT sinus. Sorry.

## 2013-11-01 NOTE — Telephone Encounter (Signed)
Must have been signed off inadvertently > let him know it was nl so no change in recs from ov

## 2013-11-01 NOTE — Telephone Encounter (Signed)
Don't see what studies are being referred to

## 2013-11-02 ENCOUNTER — Other Ambulatory Visit: Payer: Self-pay

## 2013-11-02 MED ORDER — HYDROCODONE-ACETAMINOPHEN 5-300 MG PO TABS: 1.0000 | ORAL_TABLET | ORAL | Status: DC | PRN

## 2013-11-02 NOTE — Telephone Encounter (Signed)
Spoke with pt, he is aware of results and recs.  Nothing further needed at this time.  

## 2013-11-02 NOTE — Telephone Encounter (Signed)
Patient would like to talk to you concerning his prescription.

## 2013-11-10 DIAGNOSIS — K921 Melena: Secondary | ICD-10-CM | POA: Diagnosis not present

## 2013-11-11 ENCOUNTER — Other Ambulatory Visit (INDEPENDENT_AMBULATORY_CARE_PROVIDER_SITE_OTHER): Payer: Medicare Other

## 2013-11-11 DIAGNOSIS — IMO0001 Reserved for inherently not codable concepts without codable children: Secondary | ICD-10-CM | POA: Diagnosis not present

## 2013-11-11 DIAGNOSIS — E1165 Type 2 diabetes mellitus with hyperglycemia: Principal | ICD-10-CM

## 2013-11-11 LAB — BASIC METABOLIC PANEL
BUN: 23 mg/dL (ref 6–23)
CHLORIDE: 104 meq/L (ref 96–112)
CO2: 26 mEq/L (ref 19–32)
Calcium: 8.9 mg/dL (ref 8.4–10.5)
Creatinine, Ser: 1.9 mg/dL — ABNORMAL HIGH (ref 0.4–1.5)
GFR: 36.56 mL/min — ABNORMAL LOW (ref >60.00)
Glucose, Bld: 268 mg/dL — ABNORMAL HIGH (ref 70–99)
Potassium: 3.6 mEq/L (ref 3.5–5.1)
SODIUM: 138 meq/L (ref 135–145)

## 2013-11-16 ENCOUNTER — Ambulatory Visit (INDEPENDENT_AMBULATORY_CARE_PROVIDER_SITE_OTHER): Payer: Medicare Other | Admitting: Internal Medicine

## 2013-11-16 ENCOUNTER — Other Ambulatory Visit: Payer: Medicare Other

## 2013-11-16 ENCOUNTER — Encounter: Payer: Self-pay | Admitting: Internal Medicine

## 2013-11-16 VITALS — BP 124/66 | HR 56 | Ht 70.0 in | Wt 181.0 lb

## 2013-11-16 DIAGNOSIS — J45991 Cough variant asthma: Secondary | ICD-10-CM | POA: Diagnosis not present

## 2013-11-16 DIAGNOSIS — R972 Elevated prostate specific antigen [PSA]: Secondary | ICD-10-CM | POA: Diagnosis not present

## 2013-11-16 DIAGNOSIS — C61 Malignant neoplasm of prostate: Secondary | ICD-10-CM | POA: Diagnosis not present

## 2013-11-16 LAB — FRUCTOSAMINE: Fructosamine: 313 umol/L — ABNORMAL HIGH (ref 190–270)

## 2013-11-16 MED ORDER — PREDNISONE 10 MG PO TABS: ORAL_TABLET | ORAL | Status: DC

## 2013-11-16 MED ORDER — BUDESONIDE-FORMOTEROL FUMARATE 80-4.5 MCG/ACT IN AERO: INHALATION_SPRAY | RESPIRATORY_TRACT | Status: DC

## 2013-11-16 NOTE — Progress Notes (Signed)
Subjective:    Patient ID: Barry HASHIMI Sr., male    DOB: 06-Nov-1932  MRN: [id number]   Brief patient profile:  35 yowm never smoker with coughing daily x all his life but did not affect athletics through HS not better with inhalers in past  and worse since 2014 so referred by Dr Dwyane Dee 10/12/2013 to pulmonary clinic for cough eval.    10/12/2013 1st [facility] office visit/ Barry Mcdaniel  Chief Complaint  Patient presents with  . Pulmonary Consult    Referred per Dr. [name]. Pt c/o cough "ever since I was born".  Cough is prod with minimal dark grey sputum. Cough seems worse at night.  He also c/o CP "when I exercise too much"- sharp and dull pain.   extremely difficult hx, very evasive. His dark grey mucus was coughed up during the exam and is clearly quite yellow, not even close to a gray shade, thick x one tsp after a coughing fit Rec Levaquin 500 mg daily x 10 days and then do sinus CT> neg   Continue nasacort twice daily  Prednisone 10 mg take  4 each am x 2 days,   2 each am x 2 days,  1 each am x 2 days and stop  Pantoprazole (protonix) 40 mg   Take 30-60 min before first meal of the day and Pepcid 20 mg one bedtime until return to office    11/16/2013 f/u ov/Barry Mcdaniel re:  Chief Complaint  Patient presents with  . Follow-up    Pt states he has had no change since last visit.  Still c/o prod cough with dark gray mucus.   was transiently much better while on pred then worse w/in a few days of stopping    Kouffman Reflux v Neurogenic Cough Differentiator Reflux Comments  Do you awaken from a sound sleep coughing violently?                            With trouble breathing? Yes   Do you have choking episodes when you cannot  Get enough air, gasping for air ?              no   Do you usually cough when you lie down into  The bed, or when you just lie down to rest ?                          no   Do you usually cough after meals or eating?         No    Do you cough when (or after)  you bend over?    no   GERD SCORE     Kouffman Reflux v Neurogenic Cough Differentiator Neurogenic   Do you more-or-less cough all day long? yes   Does change of temperature make you cough? yes   Does laughing or chuckling cause you to cough? no   Do fumes (perfume, automobile fumes, burned  Toast, etc.,) cause you to cough ?      grease   Does speaking, singing, or talking on the phone cause you to cough   ?               No    Neurogenic/Airway score       No obvious other patterns to  day to day or daytime variabilty or assoc sob  or cp or chest  tightness, subjective wheeze overt sinus or hb symptoms. No unusual exp hx or h/o childhood pna/ asthma or knowledge of premature birth.  Sleeping ok without nocturnal  or early am exacerbation  of respiratory  c/o's or need for noct saba. Also denies any obvious fluctuation of symptoms with weather or environmental changes or other aggravating or alleviating factors except as outlined above   Current Medications, Allergies, Complete Past Medical History, Past Surgical History, Family History, and Social History were reviewed in [employer] record.  ROS  The following are not active complaints unless bolded sore throat, dysphagia, dental problems, itching, sneezing,  nasal congestion or excess/ purulent secretions, ear ache,   fever, chills, sweats, unintended wt loss, pleuritic or exertional cp, hemoptysis,  orthopnea pnd or leg swelling, presyncope, palpitations, heartburn, abdominal pain, anorexia, nausea, vomiting, diarrhea  or change in bowel or urinary habits, change in stools or urine, dysuria,hematuria,  rash, arthralgias, visual complaints, headache, numbness weakness or ataxia or problems with walking or coordination,  change in mood/affect or memory.                      Objective:   Physical Exam    11/16/13           181 Wt Readings from Last 3 Encounters:  10/12/13 183 lb 9.6 oz (83.28 kg)  09/22/13  183 lb (83.008 kg)  07/23/13 193 lb 12.8 oz (87.907 kg)      HEENT: nl dentition, turbinates, and orophanx. Nl external ear canals without cough reflex   NECK :  without JVD/Nodes/TM/ nl carotid upstrokes bilaterally   LUNGS: no acc muscle use, clear to A and P bilaterally without cough on insp or exp maneuvers   CV:  RRR  no s3 or murmur or increase in P2, no edema   ABD:  soft and nontender with nl excursion in the supine position. No bruits or organomegaly, bowel sounds nl  MS:  warm without deformities, calf tenderness, cyanosis or clubbing  SKIN: warm and dry without lesions    NEURO:  alert, approp, no deficits     CXR  10/12/2013 :  Underlying emphysema. Mild scarring in the bases. No edema or consolidation.        Assessment & Plan:

## 2013-11-16 NOTE — Patient Instructions (Addendum)
Symbicort 80 Take 2 puffs first thing in am and then another 2 puffs about 12 hours later.   Prednisone Take 4 for two days three for two days two for two days one for two days   Please schedule a follow up office visit in 4 weeks, sooner if needed

## 2013-11-18 NOTE — Assessment & Plan Note (Addendum)
-   Sinus CT  10/28/13 > Clear paranasal sinuses.  Response to prednisone suggest eos bronchitis vs cough variant asthma, both will prob respond to a longer course of pred and trial of symbicort 80 2bid   The proper method of use, as well as anticipated side effects, of a metered-dose inhaler are discussed and demonstrated to the patient. Improved effectiveness after extensive coaching during this visit to a level of approximately  75% so needs reinforcement for optimal topical rx  See instructions for specific recommendations which were reviewed directly with the patient who was given a copy with highlighter outlining the key components.

## 2013-11-23 ENCOUNTER — Ambulatory Visit (INDEPENDENT_AMBULATORY_CARE_PROVIDER_SITE_OTHER): Payer: Medicare Other | Admitting: Endocrinology

## 2013-11-23 ENCOUNTER — Encounter: Payer: Self-pay | Admitting: Endocrinology

## 2013-11-23 VITALS — BP 95/60 | HR 87 | Temp 98.4°F | Resp 16 | Ht 70.0 in | Wt 174.6 lb

## 2013-11-23 DIAGNOSIS — R079 Chest pain, unspecified: Secondary | ICD-10-CM | POA: Diagnosis not present

## 2013-11-23 DIAGNOSIS — Z23 Encounter for immunization: Secondary | ICD-10-CM

## 2013-11-23 DIAGNOSIS — N289 Disorder of kidney and ureter, unspecified: Secondary | ICD-10-CM

## 2013-11-23 DIAGNOSIS — I959 Hypotension, unspecified: Secondary | ICD-10-CM

## 2013-11-23 DIAGNOSIS — E1165 Type 2 diabetes mellitus with hyperglycemia: Principal | ICD-10-CM

## 2013-11-23 DIAGNOSIS — R81 Glycosuria: Secondary | ICD-10-CM | POA: Diagnosis not present

## 2013-11-23 DIAGNOSIS — R310 Gross hematuria: Secondary | ICD-10-CM | POA: Diagnosis not present

## 2013-11-23 DIAGNOSIS — C61 Malignant neoplasm of prostate: Secondary | ICD-10-CM | POA: Diagnosis not present

## 2013-11-23 DIAGNOSIS — IMO0001 Reserved for inherently not codable concepts without codable children: Secondary | ICD-10-CM

## 2013-11-23 DIAGNOSIS — R809 Proteinuria, unspecified: Secondary | ICD-10-CM | POA: Diagnosis not present

## 2013-11-23 MED ORDER — FLUDROCORTISONE ACETATE 0.1 MG PO TABS: 0.1000 mg | ORAL_TABLET | Freq: Every day | ORAL | Status: DC

## 2013-11-23 NOTE — Progress Notes (Signed)
Patient ID: Barry Officer Sr., male   DOB: Aug 10, 1932, 80 y.o.   MRN: [id number]   Reason for Appointment: Diabetes follow-up   History of Present Illness   Diagnosis: Type 2 diabetes mellitus, date of diagnosis: 1997.   He has been on basal bolus insulin regimen for a few years.  His blood sugars are usually variably controlled and A1c is usually over 8%  except once was 7.2.   RECENT history:  On his last visit he was advised to do the following to improve his control:  Increase Lantus by 2 units  Increase mealtime coverage by at least 2-4 units before evening meals especially if eating more carbohydrate or larger meal  More blood sugars after supper and also in the morning to help adjust both basal and bolus insulin   He was also asked to be more compliant with his diet as well as glucose monitoring at various times He was asked to try Victoza 0.6 mg again because of previous benefits and he had stopped it because of presumed side effect of constipation His blood sugars are still poorly controlled especially with his getting 2 courses of steroids from the pulmonologist, also previously had a steroid injection He has been excessively thirsty with hyperglycemia and has lost weight Has not increased his insulin much despite high readings and did not call to report readings as high as 480  His blood sugars are showing the following patterns:  Fasting readings are variable and as low as 88 significantly higher especially last weekend with steroids  Checking only sporadic readings in the afternoons and evenings and these are variable with some high readings also Mealtime insulin: He has taken variable doses of Humalog at meals but not clear if he is adjusting his dose based on his meal size However as seen yesterday evening he has sometimes missed his insulin at mealtimes despite his wife helping him to remember Basal insulin: He has not increased the dose as directed previously  The  insulin regimen is described as premeal rapid analog  6-10 acb 6-8 acl, acs   Lantus 48-50 once a day acb.   Monitors blood glucose: 1.8 times  a day.  Glucometer: One Touch.  PREMEAL Breakfast Lunch Dinner  PCS  Overall  Glucose range:  88-480   101-323   70-444   212, 332    Mean/median:      191    Meals: variable;  For breakfast at 9-12 noon he will eat egg, meat, grits; occasionally pancackes; Sandwich at times at 4 pm;  Eating supper around 7 PM; snacks in the afternoon and evening sometimes high carbohydrate foods  Physical activity: exercise: Only a little walking recently  Dietician visit: Most recent:1/14.   Wt Readings from Last 3 Encounters:  11/23/13 174 lb 9.6 oz (79.198 kg)  11/16/13 181 lb (82.101 kg)  10/12/13 183 lb 9.6 oz (83.28 kg)   Lab Results  Component Value Date   HGBA1C 9.1* 09/17/2013   HGBA1C 9.1* 04/12/2013   HGBA1C 10.2* 01/13/2013   Lab Results  Component Value Date   MICROALBUR 7.8* 06/17/2013   LDLCALC 61 09/17/2013   CREATININE 1.9* 03/14/4313    Complications: Neuropathy       Medication List       This list is accurate as of: 11/23/13 11:17 AM.  Always use your most recent med list.               aspirin EC 81 MG tablet  Take 81 mg by mouth daily.     budesonide-formoterol 80-4.5 MCG/ACT inhaler  Commonly known as:  SYMBICORT  Take 2 puffs first thing in am and then another 2 puffs about 12 hours later.     famotidine 20 MG tablet  Commonly known as:  PEPCID  One at bedtime     finasteride 5 MG tablet  Commonly known as:  PROSCAR  Take 5 mg by mouth every morning.     gabapentin 300 MG capsule  Commonly known as:  NEURONTIN  Take 1 capsule (300 mg total) by mouth 4 (four) times daily.     glucose blood test strip  Commonly known as:  ONE TOUCH ULTRA TEST  Use as instructed to check blood sugars 3 times per day dx code 250.02     Hydrocodone-Acetaminophen 5-300 MG Tabs  Take 1 tablet by mouth every 4 (four) hours as needed.      insulin glargine 100 UNIT/ML injection  Commonly known as:  LANTUS  Inject 48 Units into the skin at bedtime.     insulin lispro 100 UNIT/ML injection  Commonly known as:  HUMALOG  Inject 3-12 Units into the skin 3 (three) times daily before meals.     levothyroxine 100 MCG tablet  Commonly known as:  SYNTHROID, LEVOTHROID  Take 100 mcg by mouth daily before breakfast.     menthol-thymol Liqd  Apply 1 application topically as needed. For pain.     NASACORT ALLERGY 24HR NA  Place 1 spray into the nose 2 (two) times daily as needed.     pantoprazole 40 MG tablet  Commonly known as:  PROTONIX  Take 1 tablet (40 mg total) by mouth daily. Take 30-60 min before first meal of the day     polyethylene glycol packet  Commonly known as:  MIRALAX / GLYCOLAX  Take 17 g by mouth daily.     predniSONE 10 MG tablet  Commonly known as:  DELTASONE  Take 4 for two days three for two days two for two days one for two days     rosuvastatin 10 MG tablet  Commonly known as:  CRESTOR  Take 1 tablet (10 mg total) by mouth daily.     trolamine salicylate 10 % cream  Commonly known as:  ASPERCREME  Apply 1 application topically as needed. For pain.     TUSSIN CF 30-10-100 MG/5ML solution  Generic drug:  pseudoephedrine-dextromethorphan-guaifenesin  Take 10 mLs by mouth 4 (four) times daily as needed for cough.        Allergies:  Allergies  Allergen Reactions  . Penicillins Swelling    Past Medical History  Diagnosis Date  . Hypertension     hx of hypertension no longer on meds   . Myocardial infarction     2.5 years ago   . Heart murmur   . Peripheral vascular disease   . Hypotension   . Asthma   . Recurrent upper respiratory infection (URI)     hx of bronchitis   . Diabetes mellitus   . Pneumonia     hx of pneumonia as a child   . Hypothyroidism   . Anemia     hx of year ago no problems now per pt   . Chronic kidney disease     hx of uti recently ua done 03/13/11-  clear per pt   . GERD (gastroesophageal reflux disease)   . H/O hiatal hernia     hx of hiatal hernia  1983   . Arthritis     generalized   . Cancer     hx of prostate cancer     Past Surgical History  Procedure Laterality Date  . Tonsillectomy    . Other surgical history      arthroscopic right knee surgery   . Back surgery      hx of x 2   . Prostate surgery      x 2   . Other surgical history      right arm surgery due to trauma   . Shoulder open rotator cuff repair  03/20/2011    Procedure: ROTATOR CUFF REPAIR SHOULDER OPEN;  Surgeon: Tobi Bastos;  Location: [facility];  Service: Orthopedics;  Laterality: Right;  . Spine surgery      Family History  Problem Relation Age of Onset  . Heart disease Mother   . Stroke Father   . Cancer Brother     unsure of type    Social History:  reports that he has never smoked. He has never used smokeless tobacco. He reports that he does not use illicit drugs. His alcohol history is not on file.  Review of Systems  Renal dysfuntion: His creatinine is unexpectedly higher at 1.9. He did take Aleve twice a day for a couple of days prior to this test but no other nephrotoxic drugs. He have a CT scan of his sinuses on 10/28/13 but this was without contrast Also his blood pressure is relatively low recently and feels a little lightheaded Losartan was stopped, was only taking 25 mg  He has had a couple of episodes of swimmy headedness and he did fall once last week. He thinks his symptoms are better today   Chest pain: He has occasional episodes of transient chest discomfort, occasionally also pressure sensation at rest in the evenings which may may last for a minute or 2. He had negative evaluation for CAD last year and was seen by cardiologist in 5/15  He has been diagnosed to have COPD by the pulmonologist and is now on another course of steroids along a steroid inhaler which is helping subjectively.  Anemia: Last hemoglobin was  normal  Lab Results  Component Value Date   WBC 9.1 09/17/2013   HGB 14.4 09/17/2013   HCT 44.2 09/17/2013   MCV 85.6 09/17/2013   PLT 167.0 09/17/2013     HYPERLIPIDEMIA: Well controlled with Crestor   Lab Results  Component Value Date   CHOL 139 09/17/2013   HDL 53.10 09/17/2013   LDLCALC 61 09/17/2013   TRIG 127.0 09/17/2013   CHOLHDL 3 09/17/2013     He was diagnosed have had sleep apnea but he thinks symptoms are minor and has not wanted to use a CPAP    History of ? Pulmonary embolism in the past, no dyspnea   History of mild hypothyroidism, well controlled  Lab Results  Component Value Date   TSH 1.48 06/17/2013    He has had less low back pain since epidural steroid injection  He has multiple joint pains and takes oxycodone or Vicodin at times.  He has mild valvular disease an echo     Examination:   BP 93/70  Pulse 87  Temp(Src) 98.4 F (36.9 C)  Resp 16  Ht 5\' 10"  (1.778 m)  Wt 174 lb 9.6 oz (79.198 kg)  BMI 25.05 kg/m2  SpO2 98%  Body mass index is 25.05 kg/(m^2).   Standing blood pressure 95/60 His  pulse has some irregularity  Mucous membranes are dry His heart sounds are distant Lungs clear No pedal edema  Assesment/Plan:   Diabetes type 2, uncontrolled  The patient's diabetes control appears to be worse with getting steroids Discussed adjustment of insulin to control hyperglycemia at least temporarily while the effects of steroids is causing hyperglycemia  Also he is symptomatic from the hyperglycemia and also appears to be having relatively low blood pressure now This may be a combination of hypovolemia as well as autonomic neuropathy causing orthostatic hypotension   RENAL dysfunction and low normal blood pressure: Likely to be from hypovolemia from hyperglycemia Since his blood pressure is still low despite blood sugars being below 300 will give him a short course of Florinef  ? Vertigo: Symptoms appear better today  COPD: Improving  subjectively with steroid inhaler and oral steroid  Chest pain: EKG unchanged today and his pain sounds atypical. Consider followup cardiology visit  Patient Instructions  Stop prednisone and no regular drinks  Extra 6 Lantus today and daily until am sugar under 140  Extra 4 Humalog if sugar > 200 and 6 more if over 300  Generic florinef 1 daily for 5 days then every 2 days     Total visit time = 30 minutes  Chaynce Schafer 11/23/2013, 11:17 AM

## 2013-11-23 NOTE — Patient Instructions (Addendum)
Stop prednisone and no regular drinks  Extra 6 Lantus today and daily until am sugar under 140  Extra 4 Humalog if sugar > 200 and 6 more if over 300  Generic florinef 1 daily for 5 days then every 2 days

## 2013-12-02 DIAGNOSIS — R809 Proteinuria, unspecified: Secondary | ICD-10-CM | POA: Diagnosis not present

## 2013-12-02 DIAGNOSIS — N200 Calculus of kidney: Secondary | ICD-10-CM | POA: Diagnosis not present

## 2013-12-02 DIAGNOSIS — R310 Gross hematuria: Secondary | ICD-10-CM | POA: Diagnosis not present

## 2013-12-06 ENCOUNTER — Telehealth: Payer: Self-pay | Admitting: Endocrinology

## 2013-12-06 NOTE — Telephone Encounter (Signed)
Patients wife called and would like for Suanne Marker to call her back    Thank you

## 2013-12-10 ENCOUNTER — Other Ambulatory Visit (INDEPENDENT_AMBULATORY_CARE_PROVIDER_SITE_OTHER): Payer: Medicare Other

## 2013-12-10 DIAGNOSIS — N289 Disorder of kidney and ureter, unspecified: Secondary | ICD-10-CM | POA: Diagnosis not present

## 2013-12-10 LAB — RENAL FUNCTION PANEL
ALBUMIN: 3.3 g/dL — AB (ref 3.5–5.2)
BUN: 10 mg/dL (ref 6–23)
CALCIUM: 8.8 mg/dL (ref 8.4–10.5)
CHLORIDE: 107 meq/L (ref 96–112)
CO2: 25 mEq/L (ref 19–32)
Creatinine, Ser: 1 mg/dL (ref 0.4–1.5)
GFR: 74.92 mL/min (ref >60.00)
Glucose, Bld: 268 mg/dL — ABNORMAL HIGH (ref 70–99)
POTASSIUM: 3.9 meq/L (ref 3.5–5.1)
Phosphorus: 3 mg/dL (ref 2.3–4.6)
Sodium: 141 mEq/L (ref 135–145)

## 2013-12-14 ENCOUNTER — Ambulatory Visit (INDEPENDENT_AMBULATORY_CARE_PROVIDER_SITE_OTHER): Payer: Medicare Other | Admitting: Internal Medicine

## 2013-12-14 ENCOUNTER — Encounter: Payer: Self-pay | Admitting: Internal Medicine

## 2013-12-14 VITALS — BP 120/74 | HR 74 | Temp 98.8°F | Ht 70.0 in | Wt 192.0 lb

## 2013-12-14 DIAGNOSIS — J45991 Cough variant asthma: Secondary | ICD-10-CM

## 2013-12-14 NOTE — Progress Notes (Signed)
Subjective:    Patient ID: Barry TRAWICK Sr., male    DOB: January 16, 1933  MRN: [id number]   Brief patient profile:  87 yowm never smoker with coughing daily x all his life but did not affect athletics through HS not better with inhalers in past  and worse since 2014 so referred by Dr Dwyane Dee 10/12/2013 to pulmonary clinic for cough eval.    10/12/2013 1st [facility] office visit/ Barry Mcdaniel  Chief Complaint  Patient presents with  . Pulmonary Consult    Referred per Dr. [name]. Pt c/o cough "ever since I was born".  Cough is prod with minimal dark grey sputum. Cough seems worse at night.  He also c/o CP "when I exercise too much"- sharp and dull pain.   extremely difficult hx, very evasive. His dark grey mucus was coughed up during the exam and is clearly quite yellow, not even close to a gray shade, thick x one tsp after a coughing fit Rec Levaquin 500 mg daily x 10 days and then do sinus CT> neg  Continue nasacort twice daily  Prednisone 10 mg take  4 each am x 2 days,   2 each am x 2 days,  1 each am x 2 days and stop  Pantoprazole (protonix) 40 mg   Take 30-60 min before first meal of the day and Pepcid 20 mg one bedtime until return to office    11/16/2013 f/u ov/Ayaansh Mcdaniel re:  Chief Complaint  Patient presents with  . Follow-up    Pt states he has had no change since last visit.  Still c/o prod cough with dark gray mucus.   was transiently much better while on pred then worse w/in a few days of stopping    Kouffman Reflux v Neurogenic Cough Differentiator Reflux Comments  Do you awaken from a sound sleep coughing violently?                            With trouble breathing? Yes   Do you have choking episodes when you cannot  Get enough air, gasping for air ?              no   Do you usually cough when you lie down into  The bed, or when you just lie down to rest ?                          no   Do you usually cough after meals or eating?         No    Do you cough when (or after)  you bend over?    no   GERD SCORE     Kouffman Reflux v Neurogenic Cough Differentiator Neurogenic   Do you more-or-less cough all day long? yes   Does change of temperature make you cough? yes   Does laughing or chuckling cause you to cough? no   Do fumes (perfume, automobile fumes, burned  Toast, etc.,) cause you to cough ?      grease   Does speaking, singing, or talking on the phone cause you to cough   ?               No    Neurogenic/Airway score    rec Symbicort 80 Take 2 puffs first thing in am and then another 2 puffs about 12 hours later.  Prednisone Take 4 for  two days three for two days two for two days one for two days     12/14/2013 f/u ov/Barry Mcdaniel re: chronic cough pred responsive Chief Complaint  Patient presents with  . Follow-up    Pt states that his cough has improved some- still producing some yellow/grey sputum.  No new co's today.   Not limited by breathing from desired activities  But very sedentary    No obvious other patterns to  day to day or daytime variabilty or assoc  cp or chest tightness, subjective wheeze overt sinus or hb symptoms. No unusual exp hx or h/o childhood pna/ asthma or knowledge of premature birth.  Sleeping ok without nocturnal  or early am exacerbation  of respiratory  c/o's or need for noct saba. Also denies any obvious fluctuation of symptoms with weather or environmental changes or other aggravating or alleviating factors except as outlined above   Current Medications, Allergies, Complete Past Medical History, Past Surgical History, Family History, and Social History were reviewed in [employer] record.  ROS  The following are not active complaints unless bolded sore throat, dysphagia, dental problems, itching, sneezing,  nasal congestion or excess/ purulent secretions, ear ache,   fever, chills, sweats, unintended wt loss, pleuritic or exertional cp, hemoptysis,  orthopnea pnd or leg swelling, presyncope,  palpitations, heartburn, abdominal pain, anorexia, nausea, vomiting, diarrhea  or change in bowel or urinary habits, change in stools or urine, dysuria,hematuria,  rash, arthralgias, visual complaints, headache, numbness weakness or ataxia or problems with walking or coordination,  change in mood/affect or memory.                      Objective:   Physical Exam    11/16/13             181 > 12/16/2013   192  Wt Readings from Last 3 Encounters:  10/12/13 183 lb 9.6 oz (83.28 kg)  09/22/13 183 lb (83.008 kg)  07/23/13 193 lb 12.8 oz (87.907 kg)      HEENT: nl dentition, turbinates, and orophanx. Nl external ear canals without cough reflex   NECK :  without JVD/Nodes/TM/ nl carotid upstrokes bilaterally   LUNGS: no acc muscle use, clear to A and P bilaterally without cough on insp or exp maneuvers   CV:  RRR  no s3 or murmur or increase in P2, no edema   ABD:  soft and nontender with nl excursion in the supine position. No bruits or organomegaly, bowel sounds nl  MS:  warm without deformities, calf tenderness, cyanosis or clubbing  SKIN: warm and dry without lesions    NEURO:  alert, approp, no deficits     CXR  10/12/2013 : Underlying emphysema. Mild scarring in the bases. No edema or consolidation.        Assessment & Plan:

## 2013-12-14 NOTE — Patient Instructions (Addendum)
Work on inhaler technique:  relax and gently blow all the way out then take a nice smooth deep breath back in, triggering the inhaler at same time you start breathing in.  Hold for up to 5 seconds if you can.  Rinse and gargle with water when done  Continue symbicort 80 Take 2 puffs first thing in am and then another 2 puffs about 12 hours later.     Please schedule a follow up office visit in 2 weeks, sooner if needed

## 2013-12-15 ENCOUNTER — Other Ambulatory Visit: Payer: Self-pay | Admitting: *Deleted

## 2013-12-15 ENCOUNTER — Encounter: Payer: Self-pay | Admitting: Endocrinology

## 2013-12-15 ENCOUNTER — Ambulatory Visit (INDEPENDENT_AMBULATORY_CARE_PROVIDER_SITE_OTHER): Payer: Medicare Other | Admitting: Endocrinology

## 2013-12-15 VITALS — BP 138/82 | HR 75 | Temp 98.1°F | Resp 14 | Ht 70.0 in | Wt 193.4 lb

## 2013-12-15 DIAGNOSIS — E1165 Type 2 diabetes mellitus with hyperglycemia: Secondary | ICD-10-CM | POA: Diagnosis not present

## 2013-12-15 DIAGNOSIS — E039 Hypothyroidism, unspecified: Secondary | ICD-10-CM | POA: Diagnosis not present

## 2013-12-15 DIAGNOSIS — I951 Orthostatic hypotension: Secondary | ICD-10-CM | POA: Diagnosis not present

## 2013-12-15 DIAGNOSIS — IMO0002 Reserved for concepts with insufficient information to code with codable children: Secondary | ICD-10-CM

## 2013-12-15 MED ORDER — HYDROCODONE-ACETAMINOPHEN 5-300 MG PO TABS: 1.0000 | ORAL_TABLET | ORAL | Status: DC | PRN

## 2013-12-15 NOTE — Progress Notes (Signed)
Patient ID: Barry Officer Sr., male   DOB: [birth year], 80 y.o.   MRN: [id number]   Reason for Appointment: Diabetes follow-up   History of Present Illness   Diagnosis: Type 2 diabetes mellitus, date of diagnosis: 1997.   He has been on basal bolus insulin regimen for a few years.  His blood sugars are usually variably controlled and A1c is usually over 8%  except once was 7.2.   RECENT history:  On his last visit he had marked hyperglycemia because of getting steroids from the pulmonologist He was symptomatic from hyperglycemia and blood sugars were nearly 500 Over the last 10 days his blood sugars have improved especially in the morning but overall control is still poor  His blood sugars are showing the following patterns:  Fasting readings are mostly fairly good recently with occasional readings that are high  Blood sugars are somewhat variable in the afternoon, better in the last 2 times  Most of his blood sugars around 6 PM and subsequently are significantly higher except for one reading only  His blood sugar last night was almost 500 from eating a lot of sweets  Mealtime insulin: He has taken variable doses of Humalog at meals but usually is waiting to the blood sugar goes up to increase the dose He is not able to get enough insulin to cover his late afternoon snacks which include ice cream frequently Basal insulin: He has taken 50 units which appears to be adequate  The insulin regimen is described as premeal rapid analog  6-10 acb 12 acl, acs   Lantus 50 once a day acb.   Monitors blood glucose: 2.1 times  a day.  Glucometer: One Touch.   PREMEAL Breakfast Lunch Dinner Bedtime Overall  Glucose range:  99-242   116-207   137-404   337-482    Median:  179   150   346    188    POST-MEAL PC Breakfast PC Lunch PC Dinner  Glucose range:   88-371    Mean/median:   296     Meals: variable;  For breakfast at 9-12 noon he will eat egg, meat, grits; occasionally pancackes;  Sandwich at times at 4 pm;  Eating supper around 7 PM; snacks in the afternoon and evening , frequently high carbohydrate foods and sweets including ice cream  Physical activity: exercise: Only a little walking recently  Dietician visit: Most recent:1/14.   Wt Readings from Last 3 Encounters:  12/15/13 193 lb 6.4 oz (87.726 kg)  12/14/13 192 lb (87.091 kg)  11/23/13 174 lb 9.6 oz (79.198 kg)   Lab Results  Component Value Date   HGBA1C 9.1* 09/17/2013   HGBA1C 9.1* 04/12/2013   HGBA1C 10.2* 01/13/2013   Lab Results  Component Value Date   MICROALBUR 7.8* 06/17/2013   LDLCALC 61 09/17/2013   CREATININE 1.0 70/03/7791    Complications: Neuropathy       Medication List       This list is accurate as of: 12/15/13 11:59 PM.  Always use your most recent med list.               aspirin EC 81 MG tablet  Take 81 mg by mouth daily.     budesonide-formoterol 80-4.5 MCG/ACT inhaler  Commonly known as:  SYMBICORT  Take 2 puffs first thing in am and then another 2 puffs about 12 hours later.     famotidine 20 MG tablet  Commonly known as:  PEPCID  One at bedtime     finasteride 5 MG tablet  Commonly known as:  PROSCAR  Take 5 mg by mouth every morning.     fludrocortisone 0.1 MG tablet  Commonly known as:  FLORINEF  Take 1 tablet (0.1 mg total) by mouth daily.     gabapentin 300 MG capsule  Commonly known as:  NEURONTIN  Take 1 capsule (300 mg total) by mouth 4 (four) times daily.     glucose blood test strip  Commonly known as:  ONE TOUCH ULTRA TEST  Use as instructed to check blood sugars 3 times per day dx code 250.02     Hydrocodone-Acetaminophen 5-300 MG Tabs  Take 1 tablet by mouth every 4 (four) hours as needed.     insulin glargine 100 UNIT/ML injection  Commonly known as:  LANTUS  Inject 50 Units into the skin at bedtime.     insulin lispro 100 UNIT/ML injection  Commonly known as:  HUMALOG  Inject 3-12 Units into the skin 3 (three) times daily before meals.      levothyroxine 100 MCG tablet  Commonly known as:  SYNTHROID, LEVOTHROID  Take 100 mcg by mouth daily before breakfast.     menthol-thymol Liqd  Apply 1 application topically as needed. For pain.     NASACORT ALLERGY 24HR NA  Place 1 spray into the nose 2 (two) times daily as needed.     pantoprazole 40 MG tablet  Commonly known as:  PROTONIX  Take 1 tablet (40 mg total) by mouth daily. Take 30-60 min before first meal of the day     polyethylene glycol packet  Commonly known as:  MIRALAX / GLYCOLAX  Take 17 g by mouth daily.     rosuvastatin 10 MG tablet  Commonly known as:  CRESTOR  Take 1 tablet (10 mg total) by mouth daily.     trolamine salicylate 10 % cream  Commonly known as:  ASPERCREME  Apply 1 application topically as needed. For pain.     TUSSIN CF 30-10-100 MG/5ML solution  Generic drug:  pseudoephedrine-dextromethorphan-guaifenesin  Take 10 mLs by mouth 4 (four) times daily as needed for cough.        Allergies:  Allergies  Allergen Reactions  . Penicillins Swelling  . Prednisone     Dizziness     Past Medical History  Diagnosis Date  . Hypertension     hx of hypertension no longer on meds   . Myocardial infarction     2.5 years ago   . Heart murmur   . Peripheral vascular disease   . Hypotension   . Asthma   . Recurrent upper respiratory infection (URI)     hx of bronchitis   . Diabetes mellitus   . Pneumonia     hx of pneumonia as a child   . Hypothyroidism   . Anemia     hx of year ago no problems now per pt   . Chronic kidney disease     hx of uti recently ua done 03/13/11- clear per pt   . GERD (gastroesophageal reflux disease)   . H/O hiatal hernia     hx of hiatal hernia 1983   . Arthritis     generalized   . Cancer     hx of prostate cancer     Past Surgical History  Procedure Laterality Date  . Tonsillectomy    . Other surgical history      arthroscopic right knee surgery   .  Back surgery      hx of x 2   . Prostate  surgery      x 2   . Other surgical history      right arm surgery due to trauma   . Shoulder open rotator cuff repair  03/20/2011    Procedure: ROTATOR CUFF REPAIR SHOULDER OPEN;  Surgeon: Tobi Bastos;  Location: [facility];  Service: Orthopedics;  Laterality: Right;  . Spine surgery      Family History  Problem Relation Age of Onset  . Heart disease Mother   . Stroke Father   . Cancer Brother     unsure of type    Social History:  reports that he has never smoked. He has never used smokeless tobacco. He reports that he does not use illicit drugs. His alcohol history is not on file.  Review of Systems  Renal dysfuntion: His creatinine is better, previously was 1.9  Losartan was stopped, was only taking 25 mg  He thinks his symptoms of lightheadedness and swimming headedness are better  On his last visit his blood pressure was below 852 systolic standing up and he was started on Florinef Currently taking every other day. Blood pressure at home as been variable, the last one 778 systolic  He has been diagnosed to have COPD by the pulmonologist   Anemia: Last hemoglobin was normal  Lab Results  Component Value Date   WBC 9.1 09/17/2013   HGB 14.4 09/17/2013   HCT 44.2 09/17/2013   MCV 85.6 09/17/2013   PLT 167.0 09/17/2013     HYPERLIPIDEMIA: Well controlled with Crestor   Lab Results  Component Value Date   CHOL 139 09/17/2013   HDL 53.10 09/17/2013   LDLCALC 61 09/17/2013   TRIG 127.0 09/17/2013   CHOLHDL 3 09/17/2013     He was diagnosed have had sleep apnea but he thinks symptoms are minor and has not wanted to use a CPAP    History of ? Pulmonary embolism in the past, no dyspnea  History of mild hypothyroidism, well controlled  Lab Results  Component Value Date   TSH 1.48 06/17/2013    He has had less low back pain since epidural steroid injection  He has multiple joint pains and takes oxycodone or Vicodin at times.  He has mild valvular disease an echo     Examination:   BP 138/82  Pulse 75  Temp(Src) 98.1 F (36.7 C)  Resp 14  Ht 5\' 10"  (1.778 m)  Wt 193 lb 6.4 oz (87.726 kg)  BMI 27.75 kg/m2  SpO2 98%  Body mass index is 27.75 kg/(m^2).   Standing blood pressure 122/68  No pedal edema  Assesment/Plan:   Diabetes type 2, uncontrolled  The patient's diabetes control appears to be somewhat better overall with his getting off steroids However has marked postprandial hyperglycemia related to poor diet as discussed in history of present illness He does not control his intake of sweets like ice cream and cake and does not take mealtime insulin proactively to keep the blood sugar from going up May have done better with Victoza but he thinks it was causing constipation and does not want to try it again Also has not wanted to try to V - go pump  Discussed that Byetta may be a good option to take before lunch and possibly before dinner to help postprandial hyperglycemia and also hopefully reduce his craving for carbohydrates and sweets He will start with 5 mcg  [facility] Discussed timing of the injection, benefits and possible side effects  RENAL dysfunction and low normal blood pressure: Blood pressure is just normal today standing up Will start tapering off his Florinef  Patient Instructions  Florinef generic on Tuesdays and Saturdays only  Byetta 30 min before lunch and dinner    Counseling time over 50% of today's 25 minute visit  Ramon Zanders 12/16/2013, 11:16 AM

## 2013-12-15 NOTE — Patient Instructions (Signed)
Florinef generic on Tuesdays and Saturdays only  Byetta 30 min before lunch and dinner

## 2013-12-16 ENCOUNTER — Encounter: Payer: Self-pay | Admitting: Internal Medicine

## 2013-12-16 ENCOUNTER — Other Ambulatory Visit: Payer: Self-pay | Admitting: *Deleted

## 2013-12-16 ENCOUNTER — Telehealth: Payer: Self-pay | Admitting: Endocrinology

## 2013-12-16 MED ORDER — EXENATIDE 5 MCG/0.02ML ~~LOC~~ SOPN: 5.0000 ug | PEN_INJECTOR | Freq: Two times a day (BID) | SUBCUTANEOUS | Status: DC

## 2013-12-16 MED ORDER — INSULIN GLARGINE 100 UNIT/ML ~~LOC~~ SOLN: 50.0000 [IU] | Freq: Every day | SUBCUTANEOUS | Status: DC

## 2013-12-16 MED ORDER — INSULIN LISPRO 100 UNIT/ML ~~LOC~~ SOLN: 3.0000 [IU] | Freq: Three times a day (TID) | SUBCUTANEOUS | Status: DC

## 2013-12-16 NOTE — Telephone Encounter (Signed)
Dr Diona Browner P# [phone]  F# [phone] Ext [phone] Any further questions please call wife [phone]

## 2013-12-16 NOTE — Assessment & Plan Note (Signed)
-   Sinus CT  10/28/13 > Clear paranasal sinuses. - 11/18/13  trial of symbicort 80 2bid   He is better despite poor hfa and if this is asthma should make sure he's doing what we're asking him to do before considering asking him to do more  The proper method of use, as well as anticipated side effects, of a metered-dose inhaler are discussed and demonstrated to the patient. Improved effectiveness after extensive coaching during this visit to a level of approximately  75%    Each maintenance medication was reviewed in detail including most importantly the difference between maintenance and as needed and under what circumstances the prns are to be used.  Please see instructions for details which were reviewed in writing and the patient given a copy.

## 2013-12-27 DIAGNOSIS — N99110 Postprocedural urethral stricture, male, meatal: Secondary | ICD-10-CM | POA: Diagnosis not present

## 2013-12-27 DIAGNOSIS — R310 Gross hematuria: Secondary | ICD-10-CM | POA: Diagnosis not present

## 2013-12-27 DIAGNOSIS — C61 Malignant neoplasm of prostate: Secondary | ICD-10-CM | POA: Diagnosis not present

## 2013-12-27 DIAGNOSIS — N320 Bladder-neck obstruction: Secondary | ICD-10-CM | POA: Diagnosis not present

## 2013-12-28 ENCOUNTER — Ambulatory Visit (INDEPENDENT_AMBULATORY_CARE_PROVIDER_SITE_OTHER): Payer: Medicare Other | Admitting: Internal Medicine

## 2013-12-28 ENCOUNTER — Encounter: Payer: Self-pay | Admitting: Internal Medicine

## 2013-12-28 ENCOUNTER — Other Ambulatory Visit: Payer: Self-pay | Admitting: Urology

## 2013-12-28 VITALS — BP 142/70 | HR 64 | Temp 98.0°F | Ht 70.0 in | Wt 190.0 lb

## 2013-12-28 DIAGNOSIS — J45991 Cough variant asthma: Secondary | ICD-10-CM | POA: Diagnosis not present

## 2013-12-28 MED ORDER — BUDESONIDE-FORMOTEROL FUMARATE 160-4.5 MCG/ACT IN AERO: INHALATION_SPRAY | RESPIRATORY_TRACT | Status: DC

## 2013-12-28 NOTE — Assessment & Plan Note (Signed)
-   Sinus CT  10/28/13 > Clear paranasal sinuses. - 11/18/13  trial of symbicort 80 2bid   Finally appears to be turning the corner on a cough dating back decades and probably either cough variant asthma or eos bronchitis   The proper method of use, as well as anticipated side effects, of a metered-dose inhaler are discussed and demonstrated to the patient. Improved effectiveness after extensive coaching during this visit to a level of approximately  75% but still very inconsistent  Will try th 160 2bid dose acknowledging some risk of exac the upper airway component of the cough   Also desperately needs med reconciliation.  To keep things simple, I have asked the patient to first separate medicines that are perceived as maintenance, that is to be taken daily "no matter what", from those medicines that are taken on only on an as-needed basis and I have given the patient examples of both, and then return to see our NP to generate a  detailed  medication calendar which should be followed until the next physician sees the patient and updates it.

## 2013-12-28 NOTE — Progress Notes (Signed)
Subjective:    Patient ID: Barry NIGH Sr., male    DOB: 1932-06-16  MRN: [id number]   Brief patient profile:  57 yowm never smoker with coughing daily x all his life but did not affect athletics through HS not better with inhalers in past  and worse since 2014 so referred by Dr Dwyane Dee 10/12/2013 to pulmonary clinic for cough eval.    History of Present Illness  10/12/2013 1st [facility] office visit/ Collen Vincent  Chief Complaint  Patient presents with  . Pulmonary Consult    Referred per Dr. [name]. Pt c/o cough "ever since I was born".  Cough is prod with minimal dark grey sputum. Cough seems worse at night.  He also c/o CP "when I exercise too much"- sharp and dull pain.   extremely difficult hx, very evasive. His dark grey mucus was coughed up during the exam and is clearly quite yellow, not even close to a gray shade, thick x one tsp after a coughing fit Rec Levaquin 500 mg daily x 10 days and then do sinus CT> neg  Continue nasacort twice daily  Prednisone 10 mg take  4 each am x 2 days,   2 each am x 2 days,  1 each am x 2 days and stop  Pantoprazole (protonix) 40 mg   Take 30-60 min before first meal of the day and Pepcid 20 mg one bedtime until return to office    11/16/2013 f/u ov/Merced Brougham re:  Chief Complaint  Patient presents with  . Follow-up    Pt states he has had no change since last visit.  Still c/o prod cough with dark gray mucus.   was transiently much better while on pred then worse w/in a few days of stopping    Kouffman Reflux v Neurogenic Cough Differentiator Reflux Comments  Do you awaken from a sound sleep coughing violently?                            With trouble breathing? Yes   Do you have choking episodes when you cannot  Get enough air, gasping for air ?              no   Do you usually cough when you lie down into  The bed, or when you just lie down to rest ?                          no   Do you usually cough after meals or eating?         No      Do you cough when (or after) you bend over?    no   GERD SCORE     Kouffman Reflux v Neurogenic Cough Differentiator Neurogenic   Do you more-or-less cough all day long? yes   Does change of temperature make you cough? yes   Does laughing or chuckling cause you to cough? no   Do fumes (perfume, automobile fumes, burned  Toast, etc.,) cause you to cough ?      grease   Does speaking, singing, or talking on the phone cause you to cough   ?               No    Neurogenic/Airway score    rec Symbicort 80 Take 2 puffs first thing in am and then another 2 puffs about 12 hours  later.  Prednisone Take 4 for two days three for two days two for two days one for two days     12/14/2013 f/u ov/Latajah Thuman re: chronic cough pred responsive Chief Complaint  Patient presents with  . Follow-up    Pt states that his cough has improved some- still producing some yellow/grey sputum.  No new co's today.   Not limited by breathing from desired activities  But very sedentary  rec Work on inhaler technique:  Continue symbicort 80 Take 2 puffs first thing in am and then another 2 puffs about 12 hours later     12/28/2013 f/u ov/Zahki Hoogendoorn re: chronic cough pred responsive on symbicort 80 2bid  Chief Complaint  Patient presents with  . Follow-up    Cough is some better and breathing is doing well. No new co's today.     Still very inconsistent hfa but reports improving and hasn't really lost much ground since last pred rx completed now that on symbicort 80 2bid - very easily confused with meds "I've got 10 doctors ordering them"  No obvious other patterns to  day to day or daytime variabilty or assoc  cp or chest tightness, subjective wheeze overt sinus or hb symptoms. No unusual exp hx or h/o childhood pna/ asthma or knowledge of premature birth.  Sleeping ok without nocturnal  or early am exacerbation  of respiratory  c/o's or need for noct saba. Also denies any obvious fluctuation of symptoms with weather or  environmental changes or other aggravating or alleviating factors except as outlined above   Current Medications, Allergies, Complete Past Medical History, Past Surgical History, Family History, and Social History were reviewed in [employer] record.  ROS  The following are not active complaints unless bolded sore throat, dysphagia, dental problems, itching, sneezing,  nasal congestion or excess/ purulent secretions, ear ache,   fever, chills, sweats, unintended wt loss, pleuritic or exertional cp, hemoptysis,  orthopnea pnd or leg swelling, presyncope, palpitations, heartburn, abdominal pain, anorexia, nausea, vomiting, diarrhea  or change in bowel or urinary habits, change in stools or urine, dysuria,hematuria,  rash, arthralgias, visual complaints, headache, numbness weakness or ataxia or problems with walking or coordination,  change in mood/affect or memory.                      Objective:   Physical Exam    11/16/13             181 > 12/16/2013   192 > 12/28/2013  190  Wt Readings from Last 3 Encounters:  10/12/13 183 lb 9.6 oz (83.28 kg)  09/22/13 183 lb (83.008 kg)  07/23/13 193 lb 12.8 oz (87.907 kg)      HEENT: nl dentition, turbinates, and orophanx. Nl external ear canals without cough reflex   NECK :  without JVD/Nodes/TM/ nl carotid upstrokes bilaterally   LUNGS: no acc muscle use, min insp and exp rhonchi bilaterally    CV:  RRR  no s3 or murmur or increase in P2, no edema   ABD:  soft and nontender with nl excursion in the supine position. No bruits or organomegaly, bowel sounds nl  MS:  warm without deformities, calf tenderness, cyanosis or clubbing        CXR  10/12/2013 : Underlying emphysema. Mild scarring in the bases. No edema or consolidation.        Assessment & Plan:

## 2013-12-28 NOTE — Patient Instructions (Addendum)
Increase symbicort 160 to Take 2 puffs first thing in am and then another 2 puffs about 12 hours later.    See Tammy NP in 4 weeks with all your medications, even over the counter meds, separated in two separate bags, the ones you take no matter what vs the ones you stop once you feel better and take only as needed when you feel you need them.   Tammy  will generate for you a new user friendly medication calendar that will put [place] all on the same page re: your medication use.     Without this process, it simply isn't possible to assure that we are providing  your outpatient care  with  the attention to detail we feel you deserve.   If we cannot assure that you're getting that kind of care,  then we cannot manage your problem effectively from this clinic.  Once you have seen Tammy and we are sure that we're all on the same page with your medication use she will arrange follow up with me.

## 2013-12-31 ENCOUNTER — Telehealth: Payer: Self-pay | Admitting: Endocrinology

## 2013-12-31 ENCOUNTER — Telehealth: Payer: Self-pay | Admitting: *Deleted

## 2013-12-31 NOTE — Telephone Encounter (Signed)
No, it has to read Regular Insulin in [facility] to [facility]

## 2013-12-31 NOTE — Telephone Encounter (Signed)
Patients wife called she said the [place] can't get Humalog but they do have Regular isnulin. An order will need to be faxed if it's okay to send this in instead

## 2013-12-31 NOTE — Telephone Encounter (Signed)
Pt [facility] calling regarding insulin [facility] only [facility] regular insulin and they need an order.

## 2013-12-31 NOTE — Telephone Encounter (Signed)
ok 

## 2013-12-31 NOTE — Telephone Encounter (Signed)
?   Novolog

## 2014-01-04 DIAGNOSIS — D225 Melanocytic nevi of trunk: Secondary | ICD-10-CM | POA: Diagnosis not present

## 2014-01-04 DIAGNOSIS — B351 Tinea unguium: Secondary | ICD-10-CM | POA: Diagnosis not present

## 2014-01-04 DIAGNOSIS — L738 Other specified follicular disorders: Secondary | ICD-10-CM | POA: Diagnosis not present

## 2014-01-04 DIAGNOSIS — L814 Other melanin hyperpigmentation: Secondary | ICD-10-CM | POA: Diagnosis not present

## 2014-01-04 DIAGNOSIS — L570 Actinic keratosis: Secondary | ICD-10-CM | POA: Diagnosis not present

## 2014-01-04 DIAGNOSIS — L821 Other seborrheic keratosis: Secondary | ICD-10-CM | POA: Diagnosis not present

## 2014-01-12 ENCOUNTER — Encounter (HOSPITAL_BASED_OUTPATIENT_CLINIC_OR_DEPARTMENT_OTHER): Payer: Self-pay | Admitting: *Deleted

## 2014-01-12 NOTE — Progress Notes (Signed)
SPOKE W/ WIFE. NPO AFTER MN. ARRIVE AT 0715. NEEDS ISTAT. CURRENT EKG IN CHART AND EPIC. WILL TAKE AM MEDS W/ SIPS OF WATER.

## 2014-01-13 NOTE — Anesthesia Preprocedure Evaluation (Addendum)
Anesthesia Evaluation  Patient identified by MRN, date of birth, ID band Patient awake    Reviewed: Allergy & Precautions, H&P , NPO status , Patient's Chart, lab work & pertinent test results  History of Anesthesia Complications Negative for: history of anesthetic complications (Patient denies any problems wtih anesthesia or family history of problems with anesthesia)  Airway Mallampati: III  TM Distance: >3 FB Neck ROM: Full    Dental no notable dental hx. (+) Upper Dentures, Lower Dentures   Pulmonary asthma , sleep apnea (does not wear recommended CPAP) ,  Hx of mild emphesyma and chronic cough  breath sounds clear to auscultation  Pulmonary exam normal       Cardiovascular Exercise Tolerance: Good Pt. on medications + Past MI (no interventions, reports no problems since 08/10 ) and + Peripheral Vascular Disease + dysrhythmias (reports palpitations) + Valvular Problems/Murmurs (reports this problems since birth, denies syncope, DOE, CP or any physical limitations) AS and MR Rhythm:Regular Rate:Normal + Systolic murmurs OSH records indicate stress testing in 2014: Normal myocardial perfusion with no evidence of ischemia or infarct    Neuro/Psych PSYCHIATRIC DISORDERS Anxiety negative neurological ROS     GI/Hepatic Neg liver ROS, GERD-  Medicated and Controlled,  Endo/Other  diabetes, Poorly Controlled, Type 2, Insulin DependentHypothyroidism   Renal/GU Renal diseasenegative Renal ROS  negative genitourinary   Musculoskeletal  (+) Arthritis -, Osteoarthritis,    Abdominal   Peds negative pediatric ROS (+)  Hematology negative hematology ROS (+)   Anesthesia Other Findings   Reproductive/Obstetrics negative OB ROS                            Anesthesia Physical Anesthesia Plan  ASA: III  Anesthesia Plan: General   Post-op Pain Management:    Induction: Intravenous  Airway  Management Planned: LMA  Additional Equipment:   Intra-op Plan:   Post-operative Plan: Extubation in OR  Informed Consent: I have reviewed the patients History and Physical, chart, labs and discussed the procedure including the risks, benefits and alternatives for the proposed anesthesia with the patient or authorized representative who has indicated his/her understanding and acceptance.   Dental advisory given  Plan Discussed with: CRNA  Anesthesia Plan Comments:         Anesthesia Quick Evaluation

## 2014-01-14 ENCOUNTER — Encounter (HOSPITAL_BASED_OUTPATIENT_CLINIC_OR_DEPARTMENT_OTHER): Payer: Self-pay | Admitting: *Deleted

## 2014-01-14 ENCOUNTER — Ambulatory Visit (HOSPITAL_BASED_OUTPATIENT_CLINIC_OR_DEPARTMENT_OTHER): Payer: Medicare Other | Admitting: Anesthesiology

## 2014-01-14 ENCOUNTER — Ambulatory Visit (HOSPITAL_BASED_OUTPATIENT_CLINIC_OR_DEPARTMENT_OTHER)
Admission: RE | Admit: 2014-01-14 | Discharge: 2014-01-14 | Disposition: A | Discharge status: Home / Self Care | Payer: Medicare Other | Source: Ambulatory Visit | Attending: Urology | Admitting: Urology

## 2014-01-14 ENCOUNTER — Encounter (HOSPITAL_BASED_OUTPATIENT_CLINIC_OR_DEPARTMENT_OTHER)
Admission: RE | Disposition: A | Discharge status: Home / Self Care | Payer: Self-pay | Source: Ambulatory Visit | Attending: Urology

## 2014-01-14 DIAGNOSIS — Z87440 Personal history of urinary (tract) infections: Secondary | ICD-10-CM | POA: Insufficient documentation

## 2014-01-14 DIAGNOSIS — E119 Type 2 diabetes mellitus without complications: Secondary | ICD-10-CM | POA: Diagnosis not present

## 2014-01-14 DIAGNOSIS — M1990 Unspecified osteoarthritis, unspecified site: Secondary | ICD-10-CM | POA: Diagnosis not present

## 2014-01-14 DIAGNOSIS — Z880 Allergy status to penicillin: Secondary | ICD-10-CM | POA: Insufficient documentation

## 2014-01-14 DIAGNOSIS — Z794 Long term (current) use of insulin: Secondary | ICD-10-CM | POA: Diagnosis not present

## 2014-01-14 DIAGNOSIS — C61 Malignant neoplasm of prostate: Secondary | ICD-10-CM | POA: Diagnosis not present

## 2014-01-14 DIAGNOSIS — N358 Other urethral stricture: Secondary | ICD-10-CM | POA: Insufficient documentation

## 2014-01-14 DIAGNOSIS — I10 Essential (primary) hypertension: Secondary | ICD-10-CM | POA: Insufficient documentation

## 2014-01-14 DIAGNOSIS — K219 Gastro-esophageal reflux disease without esophagitis: Secondary | ICD-10-CM | POA: Insufficient documentation

## 2014-01-14 DIAGNOSIS — N200 Calculus of kidney: Secondary | ICD-10-CM | POA: Diagnosis not present

## 2014-01-14 DIAGNOSIS — G4730 Sleep apnea, unspecified: Secondary | ICD-10-CM | POA: Insufficient documentation

## 2014-01-14 DIAGNOSIS — R011 Cardiac murmur, unspecified: Secondary | ICD-10-CM | POA: Insufficient documentation

## 2014-01-14 DIAGNOSIS — R310 Gross hematuria: Secondary | ICD-10-CM | POA: Diagnosis not present

## 2014-01-14 DIAGNOSIS — N320 Bladder-neck obstruction: Secondary | ICD-10-CM | POA: Insufficient documentation

## 2014-01-14 DIAGNOSIS — E039 Hypothyroidism, unspecified: Secondary | ICD-10-CM | POA: Insufficient documentation

## 2014-01-14 DIAGNOSIS — K5730 Diverticulosis of large intestine without perforation or abscess without bleeding: Secondary | ICD-10-CM | POA: Insufficient documentation

## 2014-01-14 DIAGNOSIS — I252 Old myocardial infarction: Secondary | ICD-10-CM | POA: Insufficient documentation

## 2014-01-14 DIAGNOSIS — K449 Diaphragmatic hernia without obstruction or gangrene: Secondary | ICD-10-CM | POA: Diagnosis not present

## 2014-01-14 DIAGNOSIS — J45909 Unspecified asthma, uncomplicated: Secondary | ICD-10-CM | POA: Insufficient documentation

## 2014-01-14 DIAGNOSIS — R12 Heartburn: Secondary | ICD-10-CM | POA: Insufficient documentation

## 2014-01-14 DIAGNOSIS — Z888 Allergy status to other drugs, medicaments and biological substances status: Secondary | ICD-10-CM | POA: Diagnosis not present

## 2014-01-14 DIAGNOSIS — M109 Gout, unspecified: Secondary | ICD-10-CM | POA: Diagnosis not present

## 2014-01-14 DIAGNOSIS — I7090 Unspecified atherosclerosis: Secondary | ICD-10-CM | POA: Insufficient documentation

## 2014-01-14 DIAGNOSIS — E785 Hyperlipidemia, unspecified: Secondary | ICD-10-CM | POA: Insufficient documentation

## 2014-01-14 HISTORY — PX: CYSTOSCOPY WITH URETHRAL DILATATION: SHX5125

## 2014-01-14 HISTORY — DX: Hypotension, unspecified: I95.9

## 2014-01-14 HISTORY — DX: Personal history of pulmonary embolism: Z86.711

## 2014-01-14 HISTORY — DX: Bladder-neck obstruction: N32.0

## 2014-01-14 HISTORY — DX: Other specified postprocedural states: Z98.890

## 2014-01-14 HISTORY — DX: Calculus of kidney: N20.0

## 2014-01-14 HISTORY — DX: Alcohol dependence, in remission: F10.21

## 2014-01-14 HISTORY — DX: Testicular hypofunction: E29.1

## 2014-01-14 HISTORY — DX: Other chest pain: R07.89

## 2014-01-14 HISTORY — DX: Other complications of anesthesia, initial encounter: T88.59XA

## 2014-01-14 HISTORY — DX: Diverticulosis of large intestine without perforation or abscess without bleeding: K57.30

## 2014-01-14 HISTORY — DX: Personal history of other diseases of the respiratory system: Z87.09

## 2014-01-14 HISTORY — DX: Personal history of other diseases of the musculoskeletal system and connective tissue: Z87.39

## 2014-01-14 HISTORY — PX: TRANSURETHRAL RESECTION OF BLADDER TUMOR: SHX2575

## 2014-01-14 HISTORY — DX: Personal history of colonic polyps: Z86.010

## 2014-01-14 HISTORY — DX: Other chronic pain: G89.29

## 2014-01-14 HISTORY — DX: Personal history of other diseases of the circulatory system: Z86.79

## 2014-01-14 HISTORY — DX: Obstructive sleep apnea (adult) (pediatric): G47.33

## 2014-01-14 HISTORY — DX: Adverse effect of unspecified anesthetic, initial encounter: T41.45XA

## 2014-01-14 HISTORY — DX: Personal history of colon polyps, unspecified: Z86.0100

## 2014-01-14 HISTORY — DX: Type 2 diabetes mellitus without complications: E11.9

## 2014-01-14 HISTORY — DX: Atrioventricular block, first degree: I44.0

## 2014-01-14 HISTORY — DX: Personal history of other diseases of the nervous system and sense organs: Z86.69

## 2014-01-14 HISTORY — DX: Malignant neoplasm of prostate: C61

## 2014-01-14 HISTORY — DX: Unspecified right bundle-branch block: I45.10

## 2014-01-14 HISTORY — DX: Old myocardial infarction: I25.2

## 2014-01-14 HISTORY — DX: Dorsalgia, unspecified: M54.9

## 2014-01-14 HISTORY — DX: Other specified postprocedural states: Z85.828

## 2014-01-14 LAB — POCT I-STAT 4, (NA,K, GLUC, HGB,HCT)
GLUCOSE: 294 mg/dL — AB (ref 70–99)
HEMATOCRIT: 40 % (ref 39.0–52.0)
Hemoglobin: 13.6 g/dL (ref 13.0–17.0)
POTASSIUM: 4.2 meq/L (ref 3.7–5.3)
SODIUM: 141 meq/L (ref 137–147)

## 2014-01-14 LAB — GLUCOSE, CAPILLARY: Glucose-Capillary: 257 mg/dL — ABNORMAL HIGH (ref 70–99)

## 2014-01-14 SURGERY — CYSTOSCOPY, WITH URETHRAL DILATION
Anesthesia: General | Site: Bladder

## 2014-01-14 MED ORDER — KETOROLAC TROMETHAMINE 30 MG/ML IJ SOLN
INTRAMUSCULAR | Status: DC | PRN
  Administered 2014-01-14: 15 mg via INTRAVENOUS

## 2014-01-14 MED ORDER — LACTATED RINGERS IV SOLN
INTRAVENOUS | Status: DC
  Administered 2014-01-14: 08:00:00 via INTRAVENOUS
  Filled 2014-01-14: qty 1000

## 2014-01-14 MED ORDER — TRIMETHOPRIM 100 MG PO TABS: 100.0000 mg | ORAL_TABLET | ORAL | Status: DC

## 2014-01-14 MED ORDER — EPHEDRINE SULFATE 50 MG/ML IJ SOLN
INTRAMUSCULAR | Status: DC | PRN
  Administered 2014-01-14: 10 mg via INTRAVENOUS

## 2014-01-14 MED ORDER — SODIUM CHLORIDE 0.9 % IR SOLN
Status: DC | PRN
  Administered 2014-01-14 (×2): 3000 mL

## 2014-01-14 MED ORDER — BELLADONNA ALKALOIDS-OPIUM 16.2-60 MG RE SUPP
RECTAL | Status: AC
Start: 2014-01-14 — End: 2014-01-14
  Filled 2014-01-14: qty 1

## 2014-01-14 MED ORDER — PHENAZOPYRIDINE HCL 200 MG PO TABS: 200.0000 mg | ORAL_TABLET | Freq: Three times a day (TID) | ORAL | Status: DC | PRN

## 2014-01-14 MED ORDER — PHENYLEPHRINE HCL 10 MG/ML IJ SOLN
INTRAMUSCULAR | Status: DC | PRN
  Administered 2014-01-14 (×2): 40 ug via INTRAVENOUS

## 2014-01-14 MED ORDER — ONDANSETRON HCL 4 MG/2ML IJ SOLN
INTRAMUSCULAR | Status: DC | PRN
  Administered 2014-01-14: 4 mg via INTRAVENOUS

## 2014-01-14 MED ORDER — CIPROFLOXACIN IN D5W 400 MG/200ML IV SOLN
INTRAVENOUS | Status: AC
  Filled 2014-01-14: qty 200

## 2014-01-14 MED ORDER — ACETAMINOPHEN 10 MG/ML IV SOLN
INTRAVENOUS | Status: DC | PRN
  Administered 2014-01-14: 1000 mg via INTRAVENOUS

## 2014-01-14 MED ORDER — FENTANYL CITRATE 0.05 MG/ML IJ SOLN
25.0000 ug | INTRAMUSCULAR | Status: DC | PRN
Start: 2014-01-14 — End: 2014-01-14
  Filled 2014-01-14: qty 1

## 2014-01-14 MED ORDER — FENTANYL CITRATE 0.05 MG/ML IJ SOLN
INTRAMUSCULAR | Status: DC | PRN
  Administered 2014-01-14 (×8): 25 ug via INTRAVENOUS

## 2014-01-14 MED ORDER — LIDOCAINE HCL (CARDIAC) 20 MG/ML IV SOLN
INTRAVENOUS | Status: DC | PRN
  Administered 2014-01-14: 80 mg via INTRAVENOUS

## 2014-01-14 MED ORDER — ONDANSETRON HCL 4 MG/2ML IJ SOLN
4.0000 mg | Freq: Once | INTRAMUSCULAR | Status: DC | PRN
  Filled 2014-01-14: qty 2

## 2014-01-14 MED ORDER — FENTANYL CITRATE 0.05 MG/ML IJ SOLN
INTRAMUSCULAR | Status: AC
  Filled 2014-01-14: qty 4

## 2014-01-14 MED ORDER — CIPROFLOXACIN IN D5W 400 MG/200ML IV SOLN
400.0000 mg | INTRAVENOUS | Status: AC
  Administered 2014-01-14: 400 mg via INTRAVENOUS
  Filled 2014-01-14: qty 200

## 2014-01-14 MED ORDER — LACTATED RINGERS IV SOLN
INTRAVENOUS | Status: DC | PRN
  Administered 2014-01-14: 08:00:00 via INTRAVENOUS

## 2014-01-14 MED ORDER — PROPOFOL 10 MG/ML IV BOLUS
INTRAVENOUS | Status: DC | PRN
  Administered 2014-01-14: 40 mg via INTRAVENOUS
  Administered 2014-01-14: 160 mg via INTRAVENOUS

## 2014-01-14 MED ORDER — TRAMADOL-ACETAMINOPHEN 37.5-325 MG PO TABS: 1.0000 | ORAL_TABLET | Freq: Four times a day (QID) | ORAL | Status: DC | PRN

## 2014-01-14 SURGICAL SUPPLY — 45 items
ADAPTER CATH URET PLST 4-6FR (CATHETERS) IMPLANT
ADPR CATH URET STRL DISP 4-6FR (CATHETERS)
BAG DRAIN URO-CYSTO SKYTR STRL (DRAIN) ×4 IMPLANT
BAG DRN ANRFLXCHMBR STRAP LEK (BAG) ×2
BAG DRN UROCATH (DRAIN) ×1
BAG URINE DRAINAGE (UROLOGICAL SUPPLIES) IMPLANT
BAG URINE LEG 19OZ MD ST LTX (BAG) ×4 IMPLANT
BALLN NEPHROSTOMY (BALLOONS)
BALLOON NEPHROSTOMY (BALLOONS) IMPLANT
BOOTIES KNEE HIGH SLOAN (MISCELLANEOUS) ×4 IMPLANT
CANISTER SUCT LVC 12 LTR MEDI- (MISCELLANEOUS) ×8 IMPLANT
CATH FOLEY 2W COUNCIL 20FR 5CC (CATHETERS) IMPLANT
CATH FOLEY 2WAY SLVR  5CC 20FR (CATHETERS)
CATH FOLEY 2WAY SLVR  5CC 22FR (CATHETERS)
CATH FOLEY 2WAY SLVR 5CC 20FR (CATHETERS) IMPLANT
CATH FOLEY 2WAY SLVR 5CC 22FR (CATHETERS) IMPLANT
CATH INTERMIT  6FR 70CM (CATHETERS) IMPLANT
CATH SILASTIC FOLEY 20FRX30CC (CATHETERS) ×4 IMPLANT
CATH URET 5FR 28IN CONE TIP (BALLOONS)
CATH URET 5FR 28IN OPEN ENDED (CATHETERS) IMPLANT
CATH URET 5FR 70CM CONE TIP (BALLOONS) IMPLANT
CLOTH BEACON ORANGE TIMEOUT ST (SAFETY) ×4 IMPLANT
DRAPE CAMERA CLOSED 9X96 (DRAPES) ×4 IMPLANT
ELECT LOOP HF 24-28F (CUTTING LOOP) IMPLANT
ELECT NEEDLE 45D HF 24-28F 12D (CUTTING LOOP) ×4 IMPLANT
ELECT REM PT RETURN 9FT ADLT (ELECTROSURGICAL) ×4
ELECTRODE REM PT RTRN 9FT ADLT (ELECTROSURGICAL) ×2 IMPLANT
GLOVE BIO SURGEON STRL SZ7.5 (GLOVE) ×4 IMPLANT
GLOVE BIOGEL PI IND STRL 7.5 (GLOVE) ×4 IMPLANT
GLOVE BIOGEL PI INDICATOR 7.5 (GLOVE) ×4
GOWN STRL REIN XL XLG (GOWN DISPOSABLE) ×4 IMPLANT
GOWN STRL REUS W/TWL XL LVL3 (GOWN DISPOSABLE) ×4 IMPLANT
GOWN XL W/COTTON TOWEL STD (GOWNS) ×4 IMPLANT
GUIDEWIRE 0.038 PTFE COATED (WIRE) IMPLANT
GUIDEWIRE ANG ZIPWIRE 038X150 (WIRE) IMPLANT
GUIDEWIRE STR DUAL SENSOR (WIRE) ×4 IMPLANT
HOLDER FOLEY CATH W/STRAP (MISCELLANEOUS) IMPLANT
LOOP CUTTING 24FR OLYMPUS (CUTTING LOOP) IMPLANT
NS IRRIG 500ML POUR BTL (IV SOLUTION) ×4 IMPLANT
PACK CYSTO (CUSTOM PROCEDURE TRAY) ×4 IMPLANT
PLUG CATH AND CAP STER (CATHETERS) IMPLANT
SET ASPIRATION TUBING (TUBING) ×4 IMPLANT
SYRINGE IRR TOOMEY STRL 70CC (SYRINGE) ×4 IMPLANT
WATER STERILE IRR 3000ML UROMA (IV SOLUTION) IMPLANT
WATER STERILE IRR 500ML POUR (IV SOLUTION) ×4 IMPLANT

## 2014-01-14 NOTE — Transfer of Care (Signed)
Immediate Anesthesia Transfer of Care Note  Patient: Barry Rasmusson Veiga Sr.  Procedure(s) Performed: Procedure(s) (LRB): URETHRAL MEATAL DILATATION (N/A) TRANSURETHRAL INCISION OF BLADDER NECK CONTRACTURE (N/A)  Patient Location: PACU  Anesthesia Type: General  Level of Consciousness: awake, sedated, patient cooperative and responds to stimulation  Airway & Oxygen Therapy: Patient Spontanous Breathing and Patient connected to face mask oxygen  Post-op Assessment: Report given to PACU RN, Post -op Vital signs reviewed and stable and Patient moving all extremities  Post vital signs: Reviewed and stable  Complications: No apparent anesthesia complications

## 2014-01-14 NOTE — H&P (Signed)
Reason For Visit Cystoscopy & review CT results   Active Problems Problems  1. Bilateral kidney stones (N20.0)   Assessed By: Carolan Clines (Urology); Last Assessed: 13 Mar 2011 2. Gross hematuria (R31.0)   Assessed By: Carolan Clines (Urology); Last Assessed: 27 Dec 2013 3. Prostate cancer (C61)   Assessed By: Carolan Clines (Urology); Last Assessed: 27 Dec 2013  History of Present Illness     80 YO married male, poorly controlled Type II diabetic, recovering ETOH ( 1972), returns today for a cystoscopy & to review CT results for hx of gross hematuria.      He is s/p Vantas placement on 08/17/13 for hx of prostate cancer. He states that he had a lot of discomfort x 4 days associated with the last Firmagon injection on 11/19/12. Hx of prostate cancer, post repeat cryotherapy, 2008 for G 5 CaP; ED, hypogonadism, kidney stones & urinary incontinence.       Hx of Pca and recurrent E coli UTI's (4 over past 12 months). He states that his urine still has a foul odor. (1) nocturia 4-6/night. Is drinking 1 cup of coffee/day, and 1 diet Dr. Beverly Gust with very little water. (2) resolving UI and was wearing between 5-7 pads/day which were soaked when changed, but now 2-3 ppd, and (3) penile numbness that has not improved since cyroablation.     He is having dizzines in the late afternoon-sees Dr. Dwyane Dee, Dr. [name].     11/16/13 PSA - 1.85 (true PSA 3.7 due to taking Finasteride)  07/20/13 PSA - 4.67 (true PSA 9.34)  03/17/13 PSA - 3.32 (true PSA 6.64)  12/15/12 labs: PSA - 1.75 (true PSA - 3.50 and Testosterone - 19)  10/02/12 labs: PSA - 13.91 (true PSA 27.82 due to taking Finasteride) and Testosterone - 372  05/20/12 PSA - 7.57 (true PSA 15.17 due to taking Finasteride)  11/19/11 PSA - 4.87 (true PSA 9.74 due to taking Finasteride)  07/18/11 labs: PSA - 2.60 (true PSA - 5.60 due to taking Finasteride) and Testosterone - 281.38    06/19/11 C&S - E.Coli  treated w/ Nitrofurantoin 100mg  BID #14, then Nitrofurantoin 50mg  daily     06/12/11 PSA - 3.44 (true PSA - 6.88 due to taking Finasteride)  03/06/11 PSA - 3.51 (true PSA 7.02)    06/19/11 C&S: E coli (R) SMZ-TMP DS. Treated with Nitrofurantoin 100 mg 1 po BID X 7 days than 50 mg 1 po QHS X 4 months.  02/05/11 C&S - E.Coli bacteria treated with Cipro x 14 days.  12/26/10 C&S - E.Coli bacteria, being treated with Cipro 500mg  x 7 days.     CT abd/pelvis 3/12: multiple rectosigmoid colon diverticula.     No flexible cystoscopy.    S/P cyrotherapy on 09/17/10 for hx of prostate cancer with rising PSA. Is also s/p cryotherapy 01/2006 for CaP (see below), has PSA creep, indicating recurrent disease. He has asked about hormone therapy, however, we have reviewed the "cost/benefit" of hormones, and I have advised him against them. We have discussed finesteride monotherapy, because it will help his BPH symptoms, drop his PSA by 50%, and have almost side effect, and is generic.     PVR 12/19/10: 0 ml. Instructed to hold Tamsulosin X 1 week to see if sxs improve off medication. He had significant improvement of sxs with stopping Tamsulosin and beginning bladder drills/fluid modifications.   Past Medical History Problems  1. History  of Acute Myocardial Infarction 2. History of Arthritis 3. History of Asthma (J45.909) 4. History of Gout (M10.9) 5. History of diabetes mellitus (Z86.39) 6. History of heartburn (Z87.898) 7. History of hypertension (Z86.79) 8. History of sleep apnea (Z87.09) 9. 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 History of Shoulder Surgery 2. History of Surgery Prostate Cryosurgical Ablation 3. History of Surgery Prostate Cryosurgical Ablation  Current Meds 1. Aspirin 81 MG Oral Tablet; 1 per day;  Therapy: (Recorded:13Mar2012) to Recorded 2. Crestor 10 MG Oral Tablet;  Therapy: (Recorded:14Jan2015) to Recorded 3. Finasteride 5 MG  Oral Tablet; TAKE ONE TABLET BY MOUTH EVERY  DAY;  Therapy: 25Sep2014 to (Evaluate:20Sep2015)  Requested for:  25Sep2014; Last Rx:25Sep2014 Ordered 4. Gabapentin 300 MG Oral Capsule; begin 1 tablet hs X 3 days, then may  increase to 2 tabs/day X 3 days, then increase to 3 tablets/day;  Therapy: 21Aug2012 to (Evaluate:16Aug2013)  Requested for: 21Aug2012;  Last Rx:21Aug2012 Ordered 5. HumaLOG 100 UNIT/ML Subcutaneous Solution;  Therapy: (Recorded:09Oct2012) to Recorded 6. Lantus SOLN; 46units per day (1 time daily);  Therapy: (Recorded:13Mar2012) to Recorded 7. Levothyroxine Sodium 88 MCG Oral Tablet; TAKE 1 TABLET DAILY;  Therapy: (Recorded:13Mar2012) to Recorded 8. Losartan Potassium 25 MG Oral Tablet;  Therapy: [id number] to Recorded 9. Mucinex TB12;  Therapy: (Recorded:16Jul2012) to Recorded 10. Pantoprazole Sodium TBEC;   Therapy: (Recorded:15Sep2015) to Recorded  Allergies Medication  1. Casodex 2. Penicillins  Family History Problems  1. Family history of Death In The Family Father   Deceased at age 63 2. Family history of Death In The Family Mother   Deceased at age 28 3. Family history of Diabetes Mellitus 4. Family history of Family Health Status Number Of Children   3 sons 70. No pertinent family history : Mother 6. Family history of Prostate Cancer  Social History Problems  1. Denied: Alcohol Use 2. Caffeine Use   3 3. Marital History - Currently Married 4. Never A Smoker 5. Occupation:   retired  Review of Systems Genitourinary, constitutional, skin, eye, otolaryngeal, hematologic/lymphatic, cardiovascular, pulmonary, endocrine, musculoskeletal, gastrointestinal, neurological and psychiatric system(s) were reviewed and pertinent findings if present are noted.  Genitourinary: urinary frequency, feelings of urinary urgency, nocturia and oliguria.  Gastrointestinal: no diarrhea and no constipation.  Constitutional: night sweats, feeling tired (fatigue),  lethargy, feels terrible and tiring easily, but no fever and hot flashes.  Integumentary: no new skin rashes or lesions and no pruritus.  Eyes: no blurred vision and no diplopia.  ENT: no sore throat and no sinus problems.  Hematologic/Lymphatic: no tendency to easily bruise and no swollen glands.  Cardiovascular: no chest pain and no leg swelling.  Respiratory: no shortness of breath and no cough.  Endocrine: no polydipsia.  Musculoskeletal: back pain and joint pain.  Neurological: dizziness, but no headache.  Psychiatric: depression, but no anxiety.    Physical Exam Constitutional: Well nourished and well developed . No acute distress.  ENT:. The ears and nose are normal in appearance.  Pulmonary: No respiratory distress and normal respiratory rhythm and effort.  Cardiovascular: Heart rate and rhythm are normal . No peripheral edema.  Abdomen: The abdomen is soft and nontender. No masses are palpated. No CVA tenderness. No hernias are palpable. No hepatosplenomegaly noted.  Rectal: Rectal exam demonstrates absent sphincter tone. The prostate is absent.  Genitourinary: Examination of the penis demonstrates no discharge, no masses, no lesions and a normal meatus. The scrotum is without lesions. The right epididymis is palpably normal and  non-tender. The left epididymis is palpably normal and non-tender. The right testis is non-tender and without masses. The left testis is non-tender and without masses.  Lymphatics: The femoral and inguinal nodes are not enlarged or tender.  Skin: Normal skin turgor, no visible rash and no visible skin lesions.  Neuro/Psych:. Mood and affect are appropriate.    Results/Data Urine [Data Includes: Last 1 Day]   [phone]  COLOR YELLOW   APPEARANCE CLEAR   SPECIFIC GRAVITY 1.020   pH 5.5   GLUCOSE > 1000 mg/dL  BILIRUBIN NEG   KETONE NEG mg/dL  BLOOD NEG   PROTEIN NEG mg/dL  UROBILINOGEN 1 mg/dL  NITRITE NEG   LEUKOCYTE ESTERASE NEG   Selected  Results  AU CT-STONE PROTOCOL 24Sep2015 12:00AM Carolan Clines   Test Name Result Flag Reference  CT-STONE PROTOCOL (Report)    ** RADIOLOGY REPORT BY [facility] **   CLINICAL DATA: Gross hematuria. Bilateral renal stones.  EXAM: CT ABDOMEN AND PELVIS WITHOUT CONTRAST (URINARY CALCULUS PROTOCOL)  TECHNIQUE: Multidetector CT imaging was performed through the abdomen and pelvis without intravenous contrast to include the urinary tract.  COMPARISON: CT of the abdomen and pelvis 10/16/2012.  FINDINGS: Lung Bases: Moderate hiatal hernia.  Abdomen/Pelvis: 3 mm nonobstructive calculus in the upper pole collecting system of the left kidney. No additional calculi are identified within the right renal collecting system, along the course of either ureter, or within the lumen of the urinary bladder. No hydroureteronephrosis to suggest urinary tract obstruction at this time. The unenhanced appearance of the kidneys is otherwise unremarkable bilaterally.  The unenhanced appearance of the visualized liver, gallbladder, pancreas, spleen and bilateral adrenal glands is unremarkable. No significant volume of ascites. No pneumoperitoneum. No pathologic distention of small bowel. A few scattered colonic diverticulae are noted, without surrounding inflammatory changes to suggest an acute diverticulitis at this time. Normal appendix. There several prominent borderline enlarged retroperitoneal lymph nodes, largest of which measures up to 9 mm in short axis in the left para-aortic station (image 25 of series 2), however, these are highly nonspecific and appear smaller than prior study from 10/16/2012, presumably benign and reactive. Extensive atherosclerosis throughout the abdominal and pelvic vasculature, without evidence of aneurysm.  Musculoskeletal: There are no aggressive appearing lytic or blastic lesions noted in the visualized portions of the skeleton.  IMPRESSION: 1.  3 mm nonobstructive calculus in the upper pole collecting system of the left kidney. 2. No ureteral stones or findings of urinary tract obstruction are noted at this time. 3. Normal appendix. 4. Colonic diverticulosis, without surrounding inflammatory changes to suggest an acute diverticulitis at this time. 5. Extensive atherosclerosis. 6. Hiatal hernia.   Electronically Signed  By: Vinnie Langton M.D.  On: 12/02/2013 14:43   CREATININE with eGFR 15Sep2015 03:56PM Carolan Clines  SPECIMEN TYPE: BLOOD   Test Name Result Flag Reference  CREATININE 1.63 mg/dL H 0.50-1.50  Est GFR, African American 46 mL/min L   Est GFR, NonAfrican American 40 mL/min L   THE ESTIMATED GFR IS A CALCULATION VALID FOR ADULTS (>=6 YEARS OLD) THAT USES THE CKD-EPI ALGORITHM TO ADJUST FOR AGE AND SEX. IT IS   NOT TO BE USED FOR CHILDREN, PREGNANT WOMEN, HOSPITALIZED PATIENTS,    PATIENTS ON DIALYSIS, OR WITH RAPIDLY CHANGING KIDNEY FUNCTION. ACCORDING TO THE NKDEP, EGFR >89 IS NORMAL, 60-89 SHOWS MILD IMPAIRMENT, 30-59 SHOWS MODERATE IMPAIRMENT, 15-29 SHOWS SEVERE IMPAIRMENT AND <15 IS ESRD.   Procedure  Procedure: Cystoscopy  Chaperone Present: kim lewis.  Indication: Hematuria. Lower Urinary Tract Symptoms.  Informed Consent: Risks, benefits, and potential adverse events were discussed and informed consent was obtained from the patient.  Prep: The patient was prepped with betadine.  Anesthesia:. Local anesthesia was administered intraurethrally with 2% lidocaine jelly.  Antibiotic prophylaxis: Ciprofloxacin.  Procedure Note:  Urethral meatus:. A pinpoint and mild stricture was present at the urethral meatus and was dilated.  Anterior urethra: No abnormalities.  Prostatic urethra:. A bladder neck contracture was present.  Bladder: Visulization was clear. The ureteral orifices were in the normal anatomic position bilaterally. A systematic survey of the bladder demonstrated no bladder tumors or  stones. Examination of the bladder demonstrated no clot within the bladder and no trabeculation no erythematous mucosa and no edema. The patient tolerated the procedure well.  Complications: None.    Assessment Assessed  1. 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 Left nephrolithiasis (N20.0) 4. Gross hematuria (R31.0) 5. Prostate cancer (C61) 6. Urinary incontinence without sensory awareness (N39.42)  80 yo IDDM ( lantus and Humolog insulin), with hx of hematuria,and CaP, post Cryotherapy, now post CT showing 93m LUP stone-of no consequence- now post dilation of meatal stricture, and cystoscopy showing Right sided prostatic fossa "pit" with fluid collection undermining the bladder neck. He needs cystoscopy and trans urethral connection of the bladder neck to the "pit".   Plan Health Maintenance  1. UA With REFLEX; [Do Not Release]; Status:Resulted - Requires  Verification;   Done:: [id number]:46PM  1. Surgery for bladder neck contracture. Possible foley post op. Meatal dilation.   2. No 'Rx for LUP stone needed   Discussion/Summary cc: Dr. ARuffin Frederick  cc: Dr. PLeonides Sake    Signatures Electronically signed by : SCarolan Clines M.D.; Dec 27 2013  2:53PM EST

## 2014-01-14 NOTE — Anesthesia Postprocedure Evaluation (Signed)
  Anesthesia Post-op Note  Patient: Barry Christopherson Veley Sr.  Procedure(s) Performed: Procedure(s) (LRB): URETHRAL MEATAL DILATATION (N/A) TRANSURETHRAL INCISION OF BLADDER NECK CONTRACTURE (N/A)  Patient Location: PACU  Anesthesia Type: General  Level of Consciousness: awake and alert   Airway and Oxygen Therapy: Patient Spontanous Breathing  Post-op Pain: mild  Post-op Assessment: Post-op Vital signs reviewed, Patient's Cardiovascular Status Stable, Respiratory Function Stable, Patent Airway and No signs of Nausea or vomiting  Last Vitals:  Filed Vitals:   01/14/14 0950  BP: 184/81  Pulse: 77  Temp: 36.8 C  Resp: 8    Post-op Vital Signs: stable   Complications: No apparent anesthesia complications

## 2014-01-14 NOTE — Op Note (Signed)
Pre-operative diagnosis :   Bladder neck contracture  Postoperative diagnosis:  same  Operation  Cystourethroscopy, incision of bladder neck contracture  Surgeon:  S. Gaynelle Arabian, MD  First assistant: none  Anesthesia:  General LMA  Preparation:after appropriate pre-anesthesia, the patient brought the operating room, placed on the operative table in the dorsal supine position where general LMA anesthesia was introduced. He was replaced in the dorsal lithotomy position with the pubis was prepped with Betadine solution and draped in the usual fashion. The history was double checked. The armband was double checked.  Review history:  1. Bilateral kidney stones (N20.0)  Assessed By: Carolan Clines (Urology); Last Assessed: 13 Mar 2011 2. Gross hematuria (R31.0)  Assessed By: Carolan Clines (Urology); Last Assessed: 27 Dec 2013 3. Prostate cancer (C61)  Assessed By: Carolan Clines (Urology); Last Assessed: 27 Dec 2013  History of Present Illness    80 YO married male, poorly controlled Type II diabetic, recovering ETOH ( 1972), returns today for a cystoscopy & to review CT results for hx of gross hematuria.     He is s/p Vantas placement on 08/17/13 for hx of prostate cancer. He states that he had a lot of discomfort x 4 days associated with the last Firmagon injection on 11/19/12. Hx of prostate cancer, post repeat cryotherapy, 2008 for G 5 CaP; ED, hypogonadism, kidney stones & urinary incontinence.     Hx of Pca and recurrent E coli UTI's (4 over past 12 months). He states that his urine still has a foul odor. (1) nocturia 4-6/night. Is drinking 1 cup of coffee/day, and 1 diet Dr. Beverly Gust with very little water. (2) resolving UI and was wearing between 5-7 pads/day which were soaked when changed, but now 2-3 ppd, and (3) penile numbness that has not improved since cyroablation.  Office cystoscopy shows bladder neck contracture with we obstruction  of prostatic tissue underneath the bladder neck, creating a potential space under the trigone, right greater than left.  Statement of  Likelihood of Success: Excellent. TIME-OUT observed.:  Procedure:  Cystourethroscopy was accomplished, showing urethral meatal stenosis, which is dilated to a size 26 [place] with the [employer]. Following this, the size 26 continuous flow resectoscope is placed within the urethra without difficulty, using the direct vision obturator. The direct vision, knife is placed, and the right-sided bladder neck contracture is incised, from the bladder neck, inferiorly, into the vacated pocket within the prostate, nor to connect the 2 areas. No bleeding is noted. A similar pocket, although much smaller as noted on the left side. It is also opened, so it drained easily.  On this, a size 20 Foley catheter, with 30 mL in balloon is placed. Irrigation of a single clot is accomplished. The patient is awakened, and taken to recovery room in good condition.

## 2014-01-14 NOTE — Anesthesia Procedure Notes (Signed)
Procedure Name: LMA Insertion Date/Time: 01/14/2014 9:10 AM Performed by: Justice Rocher Pre-anesthesia Checklist: Patient identified, Emergency Drugs available, Suction available and Patient being monitored Patient Re-evaluated:Patient Re-evaluated prior to inductionOxygen Delivery Method: Circle System Utilized Preoxygenation: Pre-oxygenation with 100% oxygen Intubation Type: IV induction Ventilation: Mask ventilation without difficulty LMA: LMA inserted and LMA with gastric port inserted LMA Size: 5.0 Number of attempts: 2 Airway Equipment and Method: bite block Placement Confirmation: positive ETCO2 Tube secured with: Tape Dental Injury: Teeth and Oropharynx as per pre-operative assessment  Comments: LMA # 5 changed during induction to # 5 LMA procell for better seal = BBS equal minimum leak.

## 2014-01-14 NOTE — Discharge Instructions (Addendum)
Post transurethral resection of the prostate (TURP) instructions  Your recent prostate surgery requires very special post hospital care. Despite the fact that no skin incisions were used the area around the prostate incision is quite raw and is covered with a scab to promote healing and prevent bleeding. Certain cautions are needed to assure that the scab is not disturbed of the next 2-3 weeks while the healing proceeds.  Because the raw surface in your prostate and the irritating effects of urine you may expect frequency of urination and/or urgency (a stronger desire to urinate) and perhaps even getting up at night more often. This will usually resolve or improve slowly over the healing period. You may see some blood in your urine over the first 6 weeks. Do not be alarmed, even if the urine was clear for a while. Get off your feet and drink lots of fluids until clearing occurs. If you start to pass clots or don't improve call us.  Diet:  You may return to your normal diet immediately. Because of the raw surface of your bladder, alcohol, spicy foods, foods high in acid and drinks with caffeine may cause irritation or frequency and should be used in moderation. To keep your urine flowing freely and avoid constipation, drink plenty of fluids during the day (8-10 glasses). Tip: Avoid cranberry juice because it is very acidic.  Activity:  Your physical activity doesn't need to be restricted. However, if you are very active, you may see some blood in the urine. We suggest that you reduce your activity under the circumstances until the bleeding has stopped.  Bowels:  It is important to keep your bowels regular during the postoperative period. Straining with bowel movements can cause bleeding. A bowel movement every other day is reasonable. Use a mild laxative if needed, such as milk of magnesia 2-3 tablespoons, or 2 Dulcolax tablets. Call if you continue to have problems. If you had been taking narcotics  for pain, before, during or after your surgery, you may be constipated. Take a laxative if necessary.  Medication:  You should resume your pre-surgery medications unless told not to. In addition you may be given an antibiotic to prevent or treat infection. Antibiotics are not always necessary. All medication should be taken as prescribed until the bottles are finished unless you are having an unusual reaction to one of the drugs.     Problems you should report to [place]:  a. Fever greater than 101F. b. Heavy bleeding, or clots (see notes above about blood in urine). c. Inability to urinate. d. Drug reactions (hives, rash, nausea, vomiting, diarrhea). e. Severe burning or pain with urination that is not improving.    Post Anesthesia Home Care Instructions  Activity: Get plenty of rest for the remainder of the day. A responsible adult should stay with you for 24 hours following the procedure.  For the next 24 hours, DO NOT: -Drive a car -Paediatric nurse -Drink alcoholic beverages -Take any medication unless instructed by your physician -Make any legal decisions or sign important papers.  Meals: Start with liquid foods such as gelatin or soup. Progress to regular foods as tolerated. Avoid greasy, spicy, heavy foods. If nausea and/or vomiting occur, drink only clear liquids until the nausea and/or vomiting subsides. Call your physician if vomiting continues.  Special Instructions/Symptoms: Your throat may feel dry or sore from the anesthesia or the breathing tube placed in your throat during surgery. If this causes discomfort, gargle with warm salt water. The discomfort  should disappear within 24 hours.  Foley Catheter Care A Foley catheter is a soft, flexible tube. This tube is placed into your bladder to drain pee (urine). If you go home with this catheter in place, follow the instructions below. TAKING CARE OF THE CATHETER 1. Wash your hands with soap and water. 2. Put soap and  water on a clean washcloth.  Clean the skin where the tube goes into your body.  Clean away from the tube site.  Never wipe toward the tube.  Clean the area using a circular motion.  Remove all the soap. Pat the area dry with a clean towel. For males, reposition the skin that covers the end of the penis (foreskin). 3. Attach the tube to your leg with tape or a leg strap. Do not stretch the tube tight. If you are using tape, remove any stickiness left behind by past tape you used. 4. Keep the drainage bag below your hips. Keep it off the floor. 5. Check your tube during the day. Make sure it is working and draining. Make sure the tube does not curl, twist, or bend. 6. Do not pull on the tube or try to take it out. TAKING CARE OF THE DRAINAGE BAGS You will have a large overnight drainage bag and a small leg bag. You may wear the overnight bag any time. Never wear the small bag at night. Follow the directions below. Emptying the Drainage Bag Empty your drainage bag when it is  - full or at least 2-3 times a day. 1. Wash your hands with soap and water. 2. Keep the drainage bag below your hips. 3. Hold the dirty bag over the toilet or clean container. 4. Open the pour spout at the bottom of the bag. Empty the pee into the toilet or container. Do not let the pour spout touch anything. 5. Clean the pour spout with a gauze pad or cotton ball that has rubbing alcohol on it. 6. Close the pour spout. 7. Attach the bag to your leg with tape or a leg strap. 8. Wash your hands well. Changing the Drainage Bag Change your bag once a month or sooner if it starts to smell or look dirty.  1. Wash your hands with soap and water. 2. Pinch the rubber tube so that pee does not spill out. 3. Disconnect the catheter tube from the drainage tube at the connection valve. Do not let the tubes touch anything. 4. Clean the end of the catheter tube with an alcohol wipe. Clean the end of a the drainage tube with a  different alcohol wipe. 5. Connect the catheter tube to the drainage tube of the clean drainage bag. 6. Attach the new bag to the leg with tape or a leg strap. Avoid attaching the new bag too tightly. 7. Wash your hands well. Cleaning the Drainage Bag 1. Wash your hands with soap and water. 2. Wash the bag in warm, soapy water. 3. Rinse the bag with warm water. 4. Fill the bag with a mixture of white vinegar and water (1 cup vinegar to 1 quart warm water [.2 liter vinegar to 1 liter warm water]). Close the bag and soak it for 30 minutes in the solution. 5. Rinse the bag with warm water. 6. Hang the bag to dry with the pour spout open and hanging downward. 7. Store the clean bag (once it is dry) in a clean plastic bag. 8. Wash your hands well. PREVENT INFECTION  Wash your hands  before and after touching your tube.  Take showers every day. Wash the skin where the tube enters your body. Do not take baths. Replace wet leg straps with dry ones, if this applies.  Do not use powders, sprays, or lotions on the genital area. Only use creams, lotions, or ointments as told by your doctor.  For females, wipe from front to back after going to the bathroom.  Drink enough fluids to keep your pee clear or pale yellow unless you are told not to have too much fluid (fluid restriction).  Do not let the drainage bag or tubing touch or lie on the floor.  Wear cotton underwear to keep the area dry. GET HELP IF:  Your pee is cloudy or smells unusually bad.  Your tube becomes clogged.  You are not draining pee into the bag or your bladder feels full.  Your tube starts to leak. GET HELP RIGHT AWAY IF:  You have pain, puffiness (swelling), redness, or yellowish-white fluid (pus) where the tube enters the body.  You have pain in the belly (abdomen), legs, lower back, or bladder.  You have a fever.  You see blood fill the tube, or your pee is pink or red.  You feel sick to your stomach (nauseous),  throw up (vomit), or have chills.  Your tube gets pulled out. MAKE SURE YOU:   Understand these instructions.  Will watch your condition.  Will get help right away if you are not doing well or get worse. Document Released: 06/22/2012 Document Revised: 07/12/2013 Document Reviewed: 06/22/2012 [facility] Patient Information 2015 [place], [place]. This information is not intended to replace advice given to you by your health care provider. Make sure you discuss any questions you have with your health care provider.

## 2014-01-17 ENCOUNTER — Encounter (HOSPITAL_BASED_OUTPATIENT_CLINIC_OR_DEPARTMENT_OTHER): Payer: Self-pay | Admitting: Urology

## 2014-01-21 ENCOUNTER — Telehealth: Payer: Self-pay | Admitting: Endocrinology

## 2014-01-21 ENCOUNTER — Other Ambulatory Visit: Payer: Self-pay | Admitting: *Deleted

## 2014-01-21 MED ORDER — INSULIN GLARGINE 100 UNIT/ML ~~LOC~~ SOLN: 50.0000 [IU] | Freq: Every day | SUBCUTANEOUS | Status: DC

## 2014-01-21 NOTE — Telephone Encounter (Signed)
Patients wife called and would like a refill on Lantus solstar  Pharmacy: Sent to [place]   Please call if any question  [phone]  Thank you

## 2014-01-25 ENCOUNTER — Ambulatory Visit (INDEPENDENT_AMBULATORY_CARE_PROVIDER_SITE_OTHER): Payer: Medicare Other | Admitting: Adult Health

## 2014-01-25 ENCOUNTER — Encounter: Payer: Self-pay | Admitting: Adult Health

## 2014-01-25 VITALS — BP 108/68 | HR 88 | Temp 97.7°F | Ht 70.0 in | Wt 191.0 lb

## 2014-01-25 DIAGNOSIS — J45991 Cough variant asthma: Secondary | ICD-10-CM | POA: Diagnosis not present

## 2014-01-25 MED ORDER — BUDESONIDE-FORMOTEROL FUMARATE 160-4.5 MCG/ACT IN AERO: 2.0000 | INHALATION_SPRAY | Freq: Two times a day (BID) | RESPIRATORY_TRACT | Status: DC

## 2014-01-25 NOTE — Assessment & Plan Note (Addendum)
Improved control  Needs PFT  Patient's medications were reviewed today and patient education was given. Computerized medication calendar was adjusted/completed   Plan  Increase Symbicort 160/4.63mcg 2 puffs twice daily, rinse after use. Follow med calendar closely and bring to each visit. Follow up with Dr. Melvyn Novas in 6 weeks with PFTs

## 2014-01-25 NOTE — Progress Notes (Signed)
Subjective:    Patient ID: Barry SIORDIA Sr., male    DOB: 05-15-1932  MRN: [id number]   Brief patient profile:  44 yowm never smoker with coughing daily x all his life but did not affect athletics through HS not better with inhalers in past  and worse since 2014 so referred by Dr Dwyane Dee 10/12/2013 to pulmonary clinic for cough eval.    History of Present Illness  10/12/2013 1st [facility] office visit/ [facility]  Chief Complaint  Patient presents with  . Pulmonary Consult    Referred per Dr. [name]. Pt c/o cough "ever since I was born".  Cough is prod with minimal dark grey sputum. Cough seems worse at night.  He also c/o CP "when I exercise too much"- sharp and dull pain.   extremely difficult hx, very evasive. His dark grey mucus was coughed up during the exam and is clearly quite yellow, not even close to a gray shade, thick x one tsp after a coughing fit Rec Levaquin 500 mg daily x 10 days and then do sinus CT> neg  Continue nasacort twice daily  Prednisone 10 mg take  4 each am x 2 days,   2 each am x 2 days,  1 each am x 2 days and stop  Pantoprazole (protonix) 40 mg   Take 30-60 min before first meal of the day and Pepcid 20 mg one bedtime until return to office    11/16/2013 f/u ov/Wert re:  Chief Complaint  Patient presents with  . Follow-up    Pt states he has had no change since last visit.  Still c/o prod cough with dark gray mucus.   was transiently much better while on pred then worse w/in a few days of stopping    Kouffman Reflux v Neurogenic Cough Differentiator Reflux Comments  Do you awaken from a sound sleep coughing violently?                            With trouble breathing? Yes   Do you have choking episodes when you cannot  Get enough air, gasping for air ?              no   Do you usually cough when you lie down into  The bed, or when you just lie down to rest ?                          no   Do you usually cough after meals or eating?         No      Do you cough when (or after) you bend over?    no   GERD SCORE     Kouffman Reflux v Neurogenic Cough Differentiator Neurogenic   Do you more-or-less cough all day long? yes   Does change of temperature make you cough? yes   Does laughing or chuckling cause you to cough? no   Do fumes (perfume, automobile fumes, burned  Toast, etc.,) cause you to cough ?      grease   Does speaking, singing, or talking on the phone cause you to cough   ?               No    Neurogenic/Airway score    rec Symbicort 80 Take 2 puffs first thing in am and then another 2 puffs about 12 hours  later.  Prednisone Take 4 for two days three for two days two for two days one for two days     12/14/2013 f/u ov/[facility]ert re: chronic cough pred responsive Chief Complaint  Patient presents with  . Follow-up    Pt states that his cough has improved some- still producing some yellow/grey sputum.  No new co's today.   Not limited by breathing from desired activities  But very sedentary  rec Work on inhaler technique:  Continue symbicort 80 Take 2 puffs first thing in am and then another 2 puffs about 12 hours later     12/28/2013 f/u ov/Wert re: chronic cough pred responsive on symbicort 80 2bid  Chief Complaint  Patient presents with  . Follow-up    Cough is some better and breathing is doing well. No new co's today.     Still very inconsistent hfa but reports improving and hasn't really lost much ground since last pred rx completed now that on symbicort 80 2bid - very easily confused with meds "I've got 10 doctors ordering them" >>increaesd symbicort 160   01/25/2014 Follow up and Med Review /Cough Patient returns for a one-month follow-up and medication review. We reviewed all his medications organized them into a medication calendar patient education Last visit. Patient was instructed to increase his Symbicort up to 160 However, it doesn't appear that this was done. Since last visit. Patient feels that his  cough has improved. He denies any chest pain, orthopnea, PND or leg swelling.    Current Medications, Allergies, Complete Past Medical History, Past Surgical History, Family History, and Social History were reviewed in [employer] record.  ROS  The following are not active complaints unless bolded sore throat, dysphagia, dental problems, itching, sneezing,  nasal congestion or excess/ purulent secretions, ear ache,   fever, chills, sweats, unintended wt loss, pleuritic or exertional cp, hemoptysis,  orthopnea pnd or leg swelling, presyncope, palpitations, heartburn, abdominal pain, anorexia, nausea, vomiting, diarrhea  or change in bowel or urinary habits, change in stools or urine, dysuria,hematuria,  rash, arthralgias, visual complaints, headache, numbness weakness or ataxia or problems with walking or coordination,  change in mood/affect or memory.                      Objective:   Physical Exam    11/16/13             181 > 12/16/2013   192 > 12/28/2013  190 >191 01/25/2014     HEENT: nl dentition, turbinates, and orophanx. Nl external ear canals without cough reflex   NECK :  without JVD/Nodes/TM/ nl carotid upstrokes bilaterally  LUNGS: no acc muscle use, tr rhonchi    CV:  RRR  no s3 or murmur or increase in P2, no edema   ABD:  soft and nontender with nl excursion in the supine position. No bruits or organomegaly, bowel sounds nl  MS:  warm without deformities, calf tenderness, cyanosis or clubbing        CXR  10/12/2013 : Underlying emphysema. Mild scarring in the bases. No edema or consolidation.        Assessment & Plan:

## 2014-01-25 NOTE — Patient Instructions (Addendum)
Increase Symbicort 160/4.27mcg 2 puffs twice daily, rinse after use. Follow med calendar closely and bring to each visit. Follow up with Dr. Melvyn Novas in 6 weeks with PFTs

## 2014-01-25 NOTE — Addendum Note (Signed)
Addended by: Parke Poisson E on: 01/25/2014 05:34 PM   Modules accepted: Orders

## 2014-01-27 DIAGNOSIS — R312 Other microscopic hematuria: Secondary | ICD-10-CM | POA: Diagnosis not present

## 2014-01-28 NOTE — Addendum Note (Signed)
Addended by: Inge Rise on: 01/28/2014 05:07 PM   Modules accepted: Orders, Medications

## 2014-02-07 ENCOUNTER — Telehealth: Payer: Self-pay | Admitting: Endocrinology

## 2014-02-07 ENCOUNTER — Other Ambulatory Visit: Payer: Self-pay | Admitting: *Deleted

## 2014-02-07 MED ORDER — ROSUVASTATIN CALCIUM 10 MG PO TABS
10.0000 mg | ORAL_TABLET | Freq: Every day | ORAL | Status: DC
Start: 2014-02-07 — End: 2014-02-08

## 2014-02-07 NOTE — Telephone Encounter (Signed)
Patient would like the crestor rx sent to Dr. Rinaldo Ratel [phone] [facility]  Pain medication refill as well   Patients number [phone]  Thank You

## 2014-02-07 NOTE — Telephone Encounter (Signed)
rx sent

## 2014-02-08 ENCOUNTER — Other Ambulatory Visit: Payer: Self-pay | Admitting: *Deleted

## 2014-02-08 MED ORDER — HYDROCODONE-ACETAMINOPHEN 5-300 MG PO TABS: 1.0000 | ORAL_TABLET | ORAL | Status: DC | PRN

## 2014-02-08 MED ORDER — ROSUVASTATIN CALCIUM 10 MG PO TABS: 10.0000 mg | ORAL_TABLET | Freq: Every day | ORAL | Status: DC

## 2014-02-14 ENCOUNTER — Other Ambulatory Visit (INDEPENDENT_AMBULATORY_CARE_PROVIDER_SITE_OTHER): Payer: Medicare Other

## 2014-02-14 DIAGNOSIS — E039 Hypothyroidism, unspecified: Secondary | ICD-10-CM | POA: Diagnosis not present

## 2014-02-14 DIAGNOSIS — E1165 Type 2 diabetes mellitus with hyperglycemia: Secondary | ICD-10-CM

## 2014-02-14 DIAGNOSIS — IMO0002 Reserved for concepts with insufficient information to code with codable children: Secondary | ICD-10-CM

## 2014-02-14 LAB — COMPREHENSIVE METABOLIC PANEL
ALK PHOS: 101 U/L (ref 39–117)
ALT: 17 U/L (ref 0–53)
AST: 19 U/L (ref 0–37)
Albumin: 3.6 g/dL (ref 3.5–5.2)
BILIRUBIN TOTAL: 0.5 mg/dL (ref 0.2–1.2)
BUN: 21 mg/dL (ref 6–23)
CALCIUM: 9.1 mg/dL (ref 8.4–10.5)
CO2: 23 mEq/L (ref 19–32)
Chloride: 109 mEq/L (ref 96–112)
Creatinine, Ser: 1.4 mg/dL (ref 0.4–1.5)
GFR: 52.84 mL/min — AB (ref >60.00)
GLUCOSE: 306 mg/dL — AB (ref 70–99)
Potassium: 4.2 mEq/L (ref 3.5–5.1)
SODIUM: 142 meq/L (ref 135–145)
Total Protein: 6.7 g/dL (ref 6.0–8.3)

## 2014-02-14 LAB — HEMOGLOBIN A1C: HEMOGLOBIN A1C: 11 % — AB (ref 4.6–6.5)

## 2014-02-14 LAB — TSH: TSH: 5.38 u[IU]/mL — AB (ref 0.35–4.50)

## 2014-02-16 ENCOUNTER — Encounter: Payer: Self-pay | Admitting: Endocrinology

## 2014-02-16 ENCOUNTER — Ambulatory Visit (INDEPENDENT_AMBULATORY_CARE_PROVIDER_SITE_OTHER): Payer: Medicare Other | Admitting: Endocrinology

## 2014-02-16 VITALS — BP 138/82 | HR 79 | Temp 98.2°F | Resp 16 | Ht 70.0 in | Wt 193.8 lb

## 2014-02-16 DIAGNOSIS — E039 Hypothyroidism, unspecified: Secondary | ICD-10-CM | POA: Diagnosis not present

## 2014-02-16 DIAGNOSIS — E1165 Type 2 diabetes mellitus with hyperglycemia: Secondary | ICD-10-CM

## 2014-02-16 DIAGNOSIS — R252 Cramp and spasm: Secondary | ICD-10-CM

## 2014-02-16 DIAGNOSIS — N289 Disorder of kidney and ureter, unspecified: Secondary | ICD-10-CM

## 2014-02-16 DIAGNOSIS — IMO0002 Reserved for concepts with insufficient information to code with codable children: Secondary | ICD-10-CM

## 2014-02-16 MED ORDER — LEVOTHYROXINE SODIUM 112 MCG PO TABS: 112.0000 ug | ORAL_TABLET | Freq: Every day | ORAL | Status: DC

## 2014-02-16 NOTE — Progress Notes (Signed)
Patient ID: [name], male   DOB: 1932/04/26, 80 y.o.   MRN: [id number]   Reason for Appointment: Diabetes follow-up   History of Present Illness   Diagnosis: Type 2 diabetes mellitus, date of diagnosis: 1997.   He has been on basal bolus insulin regimen for a few years.  His blood sugars are usually variably controlled and A1c is usually over 8%  except once was 7.2.   RECENT history:  His blood sugars have been continuing to be poorly controlled Previously had problems with hyperglycemia from steroids but is only getting inhaled steroids now He is not taking the [place] that was prescribed on his last visit as he did not pick it up from the pharmacy and does not want to buy a brand-name medication His wife has cut back on purchasing sweets from the grocery store and he thinks he is watching his diet better He appears to have some difficulty remembering his diabetes management regimen today He did not bring his monitor but has sporadic blood sugars entered in his diary  His blood sugars are showing the following patterns and has the following problems identified:  Fasting readings are mostly high especially the last few days and he thinks it was over 500 this morning  He admits to occasionally forgetting his Lantus in the morning.  Also sometimes will take extra 10 units in the evening when blood sugar is high  He has only sporadic entries about his Humalog doses and may take up to 20 units for high sugars  He thinks he may not be taking his Regular Insulin consistently before meals.  Apparently he has received this since total of Humalog from his [place]  He has not exercised  Although he is having excessive thirst and urination he is trying to stay away from drinks with sugar  Has some readings over 400 after meals in the afternoon and evening but not clear how often he is doing postprandial readings  Mealtime insulin: He has taken variable doses of  Humalog as above Basal insulin: Lantus: 48- 50 units   The insulin regimen is R ? 10-20 ACC   Lantus 50 once a day acb.   Monitors blood glucose: 2.1 times  a day.  Glucometer: Precision and One Touch.  Glucose readings by home diary:  PRE-MEAL Breakfast Lunch Dinner Bedtime Overall  Glucose range: 95-365  367-416 247, 467   Mean/median:         Meals: variable;  For breakfast at 9-12 noon he will eat egg, meat, grits; occasionally pancackes; Sandwich at times at 4 pm;  Eating supper around 7 PM; snacks in the afternoon and evening ,  Physical activity: exercise: rarely Dietician visit: Most recent:1/14.   Wt Readings from Last 3 Encounters:  02/16/14 193 lb 12.8 oz (87.907 kg)  01/25/14 191 lb (86.637 kg)  01/14/14 187 lb (84.823 kg)   Lab Results  Component Value Date   HGBA1C 11.0* 02/14/2014   HGBA1C 9.1* 09/17/2013   HGBA1C 9.1* 04/12/2013   Lab Results  Component Value Date   MICROALBUR 7.8* 06/17/2013   LDLCALC 61 09/17/2013   CREATININE 1.4 71/24/5809    Complications: Neuropathy       Medication List       This list is accurate as of: 02/16/14  1:24 PM.  Always use your most recent med list.               acetaminophen 500 MG tablet  Commonly known as:  TYLENOL  Take per the bottle as needed     aspirin EC 81 MG tablet  1 tablet by mouth every morning     budesonide-formoterol 160-4.5 MCG/ACT inhaler  Commonly known as:  SYMBICORT  Inhale 2 puffs into the lungs 2 (two) times daily.     famotidine 20 MG tablet  Commonly known as:  PEPCID  One at bedtime     finasteride 5 MG tablet  Commonly known as:  PROSCAR  Take 5 mg by mouth every morning.     fludrocortisone 0.1mg /mL Susp  Commonly known as:  FLORINEF  Take by mouth. 0.1 mg on Monday and Friday mornings     gabapentin 300 MG capsule  Commonly known as:  NEURONTIN  Take 1 capsule (300 mg total) by mouth 4 (four) times daily.     glucose blood test strip  Commonly known as:  ONE  TOUCH ULTRA TEST  Use as instructed to check blood sugars 3 times per day dx code 250.02     Hydrocodone-Acetaminophen 5-300 MG Tabs  Take 1 tablet by mouth every 4 (four) hours as needed.     insulin glargine 100 UNIT/ML injection  Commonly known as:  LANTUS  Inject 0.5 mLs (50 Units total) into the skin at bedtime.     insulin lispro 100 UNIT/ML injection  Commonly known as:  HUMALOG  Inject 0.03-0.12 mLs (3-12 Units total) into the skin 3 (three) times daily before meals.     levothyroxine 100 MCG tablet  Commonly known as:  SYNTHROID, LEVOTHROID  Take 100 mcg by mouth daily before breakfast.     menthol-thymol Liqd  Apply 1 application topically as needed. For pain.     NASACORT ALLERGY 24HR NA  Place 1 spray into the nose 2 (two) times daily as needed.     pantoprazole 40 MG tablet  Commonly known as:  PROTONIX  Take 1 tablet (40 mg total) by mouth daily. Take 30-60 min before first meal of the day     phenazopyridine 200 MG tablet  Commonly known as:  PYRIDIUM  - Take 1 tablet (200 mg total) by mouth 3 (three) times daily as needed for pain. For urinary burning  - Note: will stain clothing     polyethylene glycol packet  Commonly known as:  MIRALAX / GLYCOLAX  1 capful daily as needed     rosuvastatin 10 MG tablet  Commonly known as:  CRESTOR  Take 1 tablet (10 mg total) by mouth daily.     traMADol-acetaminophen 37.5-325 MG per tablet  Commonly known as:  ULTRACET  Take 1 tablet by mouth every 6 (six) hours as needed.     trolamine salicylate 10 % cream  Commonly known as:  ASPERCREME  Apply 1 application topically as needed. For pain.     TUSSIN CF 30-10-100 MG/5ML solution  Generic drug:  pseudoephedrine-dextromethorphan-guaifenesin  Take 10 mLs by mouth every 4 (four) hours as needed for cough.        Allergies:  Allergies  Allergen Reactions  . Penicillins Swelling    Eyes and lips  . Prednisone Other (See Comments)    Dizziness     Past  Medical History  Diagnosis Date  . Heart murmur   . Peripheral vascular disease   . Asthma   . Hypothyroidism   . Anemia     hx of year ago no problems now per pt   . GERD (gastroesophageal reflux disease)   .  Arthritis     generalized   . Type 2 diabetes mellitus   . Recovering alcoholic in remission   . Complication of anesthesia     POST OP HYPOTENSION  . History of chronic bronchitis   . History of pulmonary embolus (PE)     2010  . Bladder neck contracture   . History of MI (myocardial infarction)     08/ 2010  secondary acute renal failure/ dehydration--  medical management  . History of Bell's palsy     x2  1998 &  2004 left side --  residual slight left eye droop and vace  . Chronic back pain   . History of hypertension   . Prostate carcinoma urologist-  dr Gaynelle Arabian    DX 2007  Gleason 6  and recurrent 2012  s/p  cryoablation of prostate both times  . First degree heart block   . RBBB (right bundle branch block)   . History of colon polyps   . Diverticulosis of colon   . Renal calculus, bilateral   . Hypogonadism male   . History of gout   . OSA (obstructive sleep apnea)     severe per study 2007/  non-compliant cpap  . Hypotension   . History of basal cell carcinoma excision     forehead and x6 area forearms  . Atypical chest pain     Past Surgical History  Procedure Laterality Date  . Shoulder open rotator cuff repair  03/20/2011    Procedure: ROTATOR CUFF REPAIR SHOULDER OPEN;  Surgeon: Tobi Bastos;  Location: [facility];  Service: Orthopedics;  Laterality: Right;  . Hiatal hernia repair  1983  . Knee arthroscopy w/ meniscectomy Right 01-25-2003    and chondroplasty  . Inguinal hernia repair Right 10-14-2001  . Prostate cryoablation  09-17-2010  &  04-12-2005  . Repair right arm tendon injury  1975  . Lumbar fusion  [place]  . Excision benign skin lesion of back  10/ 2011  . Cardiovascular stress test  05-19-2012  dr Tressia Miners turner    normal  perfusionstudy/  no ischemia/  attenuation artifact inferoapex region of myocardium/  note gate secondary to rhythm irregularity  . Transthoracic echocardiogram  01-11-2013  dr [name]    moderate LVH/  grade I diastolic dysfunction/ ef 24-09%/  mild AV stenosis /  mild MR/  mild LAE  . Ventral hernia repair  1998  . Colonoscopy w/ polypectomy  last one 08-10-2013  . Cataract extraction w/ intraocular lens implant Right 2014  . Tonsillectomy  as child  . Cystoscopy with urethral dilatation N/A 01/14/2014    Procedure: URETHRAL MEATAL DILATATION;  Surgeon: Ailene Rud, MD;  Location: [facility];  Service: Urology;  Laterality: N/A;  . Transurethral resection of bladder tumor N/A 01/14/2014    Procedure: TRANSURETHRAL INCISION OF BLADDER NECK CONTRACTURE;  Surgeon: Ailene Rud, MD;  Location: [facility];  Service: Urology;  Laterality: N/A;    Family History  Problem Relation Age of Onset  . Heart disease Mother   . Stroke Father   . Cancer Brother     unsure of type    Social History:  reports that he has never smoked. He has never used smokeless tobacco. He reports that he does not drink alcohol or use illicit drugs.  Review of Systems  Renal dysfuntion: His creatinine is better especially with stopping losartan and treating his relatively low blood pressure, previously  creatinine was 1.9   Lab Results  Component Value Date   CREATININE 1.4 02/14/2014    On a previous visit his blood pressure was below 201 systolic standing up and he was started on Florinef especially with having some orthostatic symptoms This has been reduced and now blood pressure is high normal with taking it twice a week  He has been diagnosed to have COPD by the pulmonologist and is taking steroid inhaler  Anemia: Last hemoglobin was normal  Lab Results  Component Value Date   WBC 9.1 09/17/2013   HGB 13.6 01/14/2014   HCT 40.0 01/14/2014   MCV 85.6  09/17/2013   PLT 167.0 09/17/2013     HYPERLIPIDEMIA: Well controlled with Crestor   Lab Results  Component Value Date   CHOL 139 09/17/2013   HDL 53.10 09/17/2013   LDLCALC 61 09/17/2013   TRIG 127.0 09/17/2013   CHOLHDL 3 09/17/2013     He was diagnosed have had sleep apnea but he thinks symptoms are minor and has not wanted to use a CPAP    History of mild hypothyroidism, usually well controlled but TSH is relatively higher now  Lab Results  Component Value Date   TSH 5.38* 02/14/2014    He has multiple joint pains and takes oxycodone or Vicodin at times.  He has mild valvular disease an echo  Complaining of nocturnal muscle cramps recently   Examination:   BP 138/82 mmHg  Pulse 79  Temp(Src) 98.2 F (36.8 C)  Resp 16  Ht 5\' 10"  (1.778 m)  Wt 193 lb 12.8 oz (87.907 kg)  BMI 27.81 kg/m2  SpO2 96%  Body mass index is 27.81 kg/(m^2).    No peripheral edema present  Assesment/Plan:   Diabetes type 2, uncontrolled  The patient's diabetes control appears to be much worse and not clear why he is having more hyperglycemia including with high fasting readings despite taking Lantus fairly consistently His diet is better and weight is stable Most likely he is not taking his mealtime insulin as directed and only sporadically Since he appears to be getting insulin resistant will have try metformin which he probably has not tried before Discussed how this works and dosage titration, possible side effects and benefits  He will also increase his insulin especially basal and his wife will help him comply with mealtime insulin as directed She will also help him be more compliant with checking blood sugar and use the brand name meter  Muscle cramps: Likely to be from hypomagnesemia as electrolytes are normal  RENAL dysfunction and low normal blood pressure: Blood pressure is high normal and he can stop Florinef.  Creatinine is stable at 1.4   Hypothyroidism: Will  increase his dose to 112 g  Patient Instructions  Stop Fludrocortisone  LANTUS 60 units at supper daily; 15 this EVENING ONLY  HUMALOG 12-20 UNITS BEFORE EVERY MEAL regardless of check sugar check  Start taking Metformin 500 mg, 1 tablet with your main meal for 5 days. Occasionally this may initially cause loose stools or nausea. If  tolerating well after 5 days add a second Metformin tablet (500 mg) at the same time. Continue adding another tablet after 5 days days if no persistent nausea or diarrhea until reaching the maximum tolerated dose or the full dose of 3 tablets once daily.  Magnesium 250 or 400mg  twice daily  Start using 1-Touch meter 3x daily    Counseling time over 50% of today's 25 minute visit  Dannell Gortney  02/16/2014, 1:24 PM

## 2014-02-16 NOTE — Patient Instructions (Addendum)
Stop Fludrocortisone  LANTUS 60 units at supper daily; 15 this EVENING ONLY  HUMALOG 12-20 UNITS BEFORE EVERY MEAL regardless of check sugar check  Start taking Metformin 500 mg, 1 tablet with your main meal for 5 days. Occasionally this may initially cause loose stools or nausea. If  tolerating well after 5 days add a second Metformin tablet (500 mg) at the same time. Continue adding another tablet after 5 days days if no persistent nausea or diarrhea until reaching the maximum tolerated dose or the full dose of 3 tablets once daily.  Magnesium 250 or 400mg  twice daily  Start using 1-Touch meter 3x daily

## 2014-02-17 ENCOUNTER — Ambulatory Visit: Payer: Medicare Other | Admitting: Endocrinology

## 2014-03-01 NOTE — Addendum Note (Signed)
Addended by: Parke Poisson E on: 03/01/2014 02:09 PM   Modules accepted: Orders, Medications

## 2014-03-08 ENCOUNTER — Telehealth: Payer: Self-pay | Admitting: Internal Medicine

## 2014-03-08 DIAGNOSIS — J45991 Cough variant asthma: Secondary | ICD-10-CM

## 2014-03-08 MED ORDER — FAMOTIDINE 20 MG PO TABS: ORAL_TABLET | ORAL | Status: DC

## 2014-03-08 MED ORDER — BUDESONIDE-FORMOTEROL FUMARATE 160-4.5 MCG/ACT IN AERO: 2.0000 | INHALATION_SPRAY | Freq: Two times a day (BID) | RESPIRATORY_TRACT | Status: DC

## 2014-03-08 MED ORDER — PANTOPRAZOLE SODIUM 40 MG PO TBEC: 40.0000 mg | DELAYED_RELEASE_TABLET | Freq: Every day | ORAL | Status: DC

## 2014-03-08 NOTE — Telephone Encounter (Signed)
Called and spoke with pt and his wife and they are aware that these meds will be faxed to the number given.  Nothing further is needed.

## 2014-03-14 ENCOUNTER — Encounter: Payer: Self-pay | Admitting: Endocrinology

## 2014-03-14 ENCOUNTER — Ambulatory Visit (INDEPENDENT_AMBULATORY_CARE_PROVIDER_SITE_OTHER): Payer: Medicare Other | Admitting: Endocrinology

## 2014-03-14 VITALS — BP 138/78 | HR 84 | Temp 98.0°F | Resp 14 | Ht 70.0 in | Wt 194.8 lb

## 2014-03-14 DIAGNOSIS — R358 Other polyuria: Secondary | ICD-10-CM | POA: Diagnosis not present

## 2014-03-14 DIAGNOSIS — E1165 Type 2 diabetes mellitus with hyperglycemia: Secondary | ICD-10-CM | POA: Diagnosis not present

## 2014-03-14 DIAGNOSIS — R079 Chest pain, unspecified: Secondary | ICD-10-CM | POA: Diagnosis not present

## 2014-03-14 DIAGNOSIS — E039 Hypothyroidism, unspecified: Secondary | ICD-10-CM

## 2014-03-14 DIAGNOSIS — N289 Disorder of kidney and ureter, unspecified: Secondary | ICD-10-CM | POA: Diagnosis not present

## 2014-03-14 DIAGNOSIS — R3589 Other polyuria: Secondary | ICD-10-CM

## 2014-03-14 DIAGNOSIS — IMO0002 Reserved for concepts with insufficient information to code with codable children: Secondary | ICD-10-CM

## 2014-03-14 MED ORDER — HYDROCODONE-ACETAMINOPHEN 5-300 MG PO TABS: 1.0000 | ORAL_TABLET | Freq: Four times a day (QID) | ORAL | Status: DC | PRN

## 2014-03-14 NOTE — Patient Instructions (Addendum)
Lantus 15 units in am daily and 55 at night   Avoid cereal and regular Coke  Humalog 20 units in am and 25 at supper and 20 at lunch if eating   METFORMIN: Take 500 mg by mouth 2 (two) times daily with a meal. Take 1 tablet daily with your mail meal, if not side effects increase to 2 a day, After 10 day increase to 3 per day  TRY TAKING Protnix at bedtime instead of Famotidine

## 2014-03-14 NOTE — Progress Notes (Signed)
Patient ID: Barry Officer Sr., male   DOB: [birth year], 81 y.o.   MRN: [id number]   Reason for Appointment: Diabetes follow-up   History of Present Illness   Diagnosis: Type 2 diabetes mellitus, date of diagnosis: 1997.   He has been on basal bolus insulin regimen for a few years.  His blood sugars are usually variably controlled and A1c is usually over 8%  except once was 7.2.   RECENT history:   His blood sugars have been slightly better than on his last visit but still averaging over 200 He did not start taking metformin as directed on his last visit as he misunderstood which pharmacy he needs to go to Although his diet has been reasonably good he is still irregular with his eating habits and sometimes will have serial at bedtime Periodically will have a little ice cream or regular Coke also including overnight On his last visit his Lantus was changed to the evening because of relatively high fasting readings He thinks he is compliant with his evening Lantus.  His blood sugars are showing the following patterns and has the following problems identified:  With taking Lantus in the evening his morning sugars are overall better but he still has a couple of high readings including this morning which he cannot explain  Fasting readings are generally being checked around midday before his first meal  He thinks he is compliant with his Lantus and evening but also sometimes will take extra 10-15 units during the day of his blood sugar is higher  He will reduce his Humalog to 10 units if his blood sugars fairly good before breakfast  Has some readings of around 300 in the evening around 7 PM and not clear which readings are postprandial  Blood sugars are more consistently high around 7-8 PM and has a couple of high readings around 9-10 PM also  Mealtime insulin: He has taken variable doses of Humalog as above Basal insulin: Lantus: 60 acs units   The insulin regimen is Humalog 10-20 AC    Lantus 60 once a day acb.   Monitors blood glucose: 2.1 times  a day.  Glucometer: Precision and One Touch.  Glucose readings by download  PRE-MEAL Breakfast Lunch Dinner  7 PM-11 PM  Overall  Glucose range:  102-295   215, 299   158-301   138-345    Mean/median:  155     286  221    Meals: variable;  For breakfast at 9-12 noon he will eat egg, meat, grits; occasionally pancackes; Sandwich at times at 4 pm;  Eating supper around 5-7 PM; snacks in the afternoon and evening ,  Physical activity: exercise: none Dietician visit: Most recent:1/14.   Wt Readings from Last 3 Encounters:  03/14/14 194 lb 12.8 oz (88.361 kg)  02/16/14 193 lb 12.8 oz (87.907 kg)  01/25/14 191 lb (86.637 kg)   Lab Results  Component Value Date   HGBA1C 11.0* 02/14/2014   HGBA1C 9.1* 09/17/2013   HGBA1C 9.1* 04/12/2013   Lab Results  Component Value Date   MICROALBUR 7.8* 06/17/2013   LDLCALC 61 09/17/2013   CREATININE 1.4 02/14/2014         Medication List       This list is accurate as of: 03/14/14  1:03 PM.  Always use your most recent med list.               acetaminophen 500 MG tablet  Commonly known as:  TYLENOL  Take per the bottle as needed     aspirin EC 81 MG tablet  1 tablet by mouth every morning     budesonide-formoterol 160-4.5 MCG/ACT inhaler  Commonly known as:  SYMBICORT  Inhale 2 puffs into the lungs 2 (two) times daily.     famotidine 20 MG tablet  Commonly known as:  PEPCID  One at bedtime     finasteride 5 MG tablet  Commonly known as:  PROSCAR  Take 5 mg by mouth every morning.     fluticasone 50 MCG/ACT nasal spray  Commonly known as:  FLONASE  Place 2 sprays into both nostrils 2 (two) times daily.     gabapentin 300 MG capsule  Commonly known as:  NEURONTIN  Take 1 capsule (300 mg total) by mouth 4 (four) times daily.     glucose blood test strip  Commonly known as:  ONE TOUCH ULTRA TEST  Use as instructed to check blood sugars 3 times per day dx  code 250.02     Hydrocodone-Acetaminophen 5-300 MG Tabs  Take 1 tablet by mouth 4 (four) times daily as needed.     insulin glargine 100 UNIT/ML injection  Commonly known as:  LANTUS  Inject 0.5 mLs (50 Units total) into the skin at bedtime.     insulin lispro 100 UNIT/ML injection  Commonly known as:  HUMALOG  Inject 0.03-0.12 mLs (3-12 Units total) into the skin 3 (three) times daily before meals.     levothyroxine 112 MCG tablet  Commonly known as:  SYNTHROID, LEVOTHROID  Take 1 tablet (112 mcg total) by mouth daily.     menthol-thymol Liqd  Apply 1 application topically as needed. For pain.     metFORMIN 500 MG tablet  Commonly known as:  GLUCOPHAGE  Take 500 mg by mouth 2 (two) times daily with a meal. Take 1 tablet daily with your mail meal, if not side effects increase to 2 a day,  After 10 day increase to 3 per day     pantoprazole 40 MG tablet  Commonly known as:  PROTONIX  Take 1 tablet (40 mg total) by mouth daily. Take 30-60 min before first meal of the day     phenazopyridine 200 MG tablet  Commonly known as:  PYRIDIUM  - Take 1 tablet (200 mg total) by mouth 3 (three) times daily as needed for pain. For urinary burning  - Note: will stain clothing     polyethylene glycol packet  Commonly known as:  MIRALAX / GLYCOLAX  1 capful daily as needed     rosuvastatin 10 MG tablet  Commonly known as:  CRESTOR  Take 1 tablet (10 mg total) by mouth daily.     TUSSIN CF 30-10-100 MG/5ML solution  Generic drug:  pseudoephedrine-dextromethorphan-guaifenesin  Take 10 mLs by mouth every 4 (four) hours as needed for cough.     TUSSIN DM 10-100 MG/5ML liquid  Generic drug:  dextromethorphan-guaiFENesin  2 tsp every 4 hours as needed for cough        Allergies:  Allergies  Allergen Reactions  . Penicillins Swelling    Eyes and lips  . Prednisone Other (See Comments)    Dizziness     Past Medical History  Diagnosis Date  . Heart murmur   . Peripheral  vascular disease   . Asthma   . Hypothyroidism   . Anemia     hx of year ago no problems now per pt   . GERD (gastroesophageal reflux  disease)   . Arthritis     generalized   . Type 2 diabetes mellitus   . Recovering alcoholic in remission   . Complication of anesthesia     POST OP HYPOTENSION  . History of chronic bronchitis   . History of pulmonary embolus (PE)     2010  . Bladder neck contracture   . History of MI (myocardial infarction)     08/ 2010  secondary acute renal failure/ dehydration--  medical management  . History of Bell's palsy     x2  1998 &  2004 left side --  residual slight left eye droop and vace  . Chronic back pain   . History of hypertension   . Prostate carcinoma urologist-  dr Gaynelle Arabian    DX 2007  Gleason 6  and recurrent 2012  s/p  cryoablation of prostate both times  . First degree heart block   . RBBB (right bundle branch block)   . History of colon polyps   . Diverticulosis of colon   . Renal calculus, bilateral   . Hypogonadism male   . History of gout   . OSA (obstructive sleep apnea)     severe per study 2007/  non-compliant cpap  . Hypotension   . History of basal cell carcinoma excision     forehead and x6 area forearms  . Atypical chest pain     Past Surgical History  Procedure Laterality Date  . Shoulder open rotator cuff repair  03/20/2011    Procedure: ROTATOR CUFF REPAIR SHOULDER OPEN;  Surgeon: Tobi Bastos;  Location: [facility];  Service: Orthopedics;  Laterality: Right;  . Hiatal hernia repair  1983  . Knee arthroscopy w/ meniscectomy Right 01-25-2003    and chondroplasty  . Inguinal hernia repair Right 10-14-2001  . Prostate cryoablation  09-17-2010  &  04-12-2005  . Repair right arm tendon injury  1975  . Lumbar fusion  [place]  . Excision benign skin lesion of back  10/ 2011  . Cardiovascular stress test  05-19-2012  dr Tressia Miners turner    normal perfusionstudy/  no ischemia/  attenuation artifact inferoapex region  of myocardium/  note gate secondary to rhythm irregularity  . Transthoracic echocardiogram  01-11-2013  dr [name]    moderate LVH/  grade I diastolic dysfunction/ ef 58-09%/  mild AV stenosis /  mild MR/  mild LAE  . Ventral hernia repair  1998  . Colonoscopy w/ polypectomy  last one 08-10-2013  . Cataract extraction w/ intraocular lens implant Right 2014  . Tonsillectomy  as child  . Cystoscopy with urethral dilatation N/A 01/14/2014    Procedure: URETHRAL MEATAL DILATATION;  Surgeon: Ailene Rud, MD;  Location: [facility];  Service: Urology;  Laterality: N/A;  . Transurethral resection of bladder tumor N/A 01/14/2014    Procedure: TRANSURETHRAL INCISION OF BLADDER NECK CONTRACTURE;  Surgeon: Ailene Rud, MD;  Location[facility];  Service: Urology;  Laterality: N/A;    Family History  Problem Relation Age of Onset  . Heart disease Mother   . Stroke Father   . Cancer Brother     unsure of type    Social History:  reports that he has never smoked. He has never used smokeless tobacco. He reports that he does not drink alcohol or use illicit drugs.  Review of Systems  He is complaining about chest discomfort in the middle of the chest and upper  part on the sides when he lies down.  It feels a little heavy and sometimes gets better with changing his position.  Does not feel out of breath.  Does not have any symptoms like this during the daytime.  Does not feel he has associated heartburn. He is taking Protonix for reflux in the morning and Pepcid at night  Renal dysfuntion: His creatinine is better as of 12/15  with stopping losartan and treating his relatively low blood pressure, previously creatinine was 1.9   Lab Results  Component Value Date   CREATININE 1.4 02/14/2014    Blood pressure is relatively higher today: Previously was relatively low when his blood sugars were out of control.  Not taking Florinef now  He has  COPD,  treated by the pulmonologist and is taking steroid inhaler    HYPERLIPIDEMIA: Well controlled with Crestor   Lab Results  Component Value Date   CHOL 139 09/17/2013   HDL 53.10 09/17/2013   LDLCALC 61 09/17/2013   TRIG 127.0 09/17/2013   CHOLHDL 3 09/17/2013     He was diagnosed have had sleep apnea but he thinks symptoms are minor and has not wanted to use a CPAP    History of mild hypothyroidism, usually well controlled but his dose was increased on his last visit to 112 g because of high TSH  Lab Results  Component Value Date   TSH 5.38* 02/14/2014    He has multiple joint pains and takes oxycodone or Vicodin at times.  He has mild valvular disease on his echo   He is asking about frequent urination including at night, is treated by urologist.  However asking about need to urinate if he hears running water   Examination:   BP 138/78 mmHg  Pulse 84  Temp(Src) 98 F (36.7 C)  Resp 14  Ht 5\' 10"  (1.778 m)  Wt 194 lb 12.8 oz (88.361 kg)  BMI 27.95 kg/m2  SpO2 96%  Body mass index is 27.95 kg/(m^2).   No chest wall tenderness Heart sounds normal Lungs clear No peripheral edema present  Assesment/Plan:   Diabetes type 2, uncontrolled  The patient's diabetes control appears to be still out of control especially after meals  Also appears to be needing Lantus twice a day now with high readings in the evenings consistently He did not start metformin as directed which would help his insulin resistance and discussed importance of starting this now Discussed improving diet and snacks Also discussed increasing his insulin regimen as below Consider adding NPH insulin in the morning if blood sugars are consistently high later in the day He will call if blood sugars are not improved Start walking when able to  Chest pain on supine position: This may possibly be related to reflux as it is positional only good He will try to take Protonix twice a day and if not improved  changed to Nexium He will also discussed this with pulmonologist  ?  Hypertension: Blood pressure is relatively higher today although somewhat lower on standing up  Given prescription for 90 tablets of hydrocodone for joint pain  Hypothyroidism: Will need to recheck TSH on the next visit  Patient Instructions  Lantus 15 units in am daily and 55 at night   Avoid cereal and regular Coke  Humalog 20 units in am and 25 at supper and 20 at lunch if eating   METFORMIN: Take 500 mg by mouth 2 (two) times daily with a meal. Take 1 tablet daily  with your mail meal, if not side effects increase to 2 a day, After 10 day increase to 3 per day  TRY TAKING Protnix at bedtime instead of Famotidine   Counseling time over 50% of today's 25 minute visit  Barry Mcdaniel 03/14/2014, 1:03 PM

## 2014-04-20 ENCOUNTER — Other Ambulatory Visit (INDEPENDENT_AMBULATORY_CARE_PROVIDER_SITE_OTHER): Payer: Medicare Other

## 2014-04-20 DIAGNOSIS — IMO0002 Reserved for concepts with insufficient information to code with codable children: Secondary | ICD-10-CM

## 2014-04-20 DIAGNOSIS — E1165 Type 2 diabetes mellitus with hyperglycemia: Secondary | ICD-10-CM

## 2014-04-20 DIAGNOSIS — E039 Hypothyroidism, unspecified: Secondary | ICD-10-CM | POA: Diagnosis not present

## 2014-04-20 LAB — T4, FREE: Free T4: 1.01 ng/dL (ref 0.60–1.60)

## 2014-04-20 LAB — BASIC METABOLIC PANEL
BUN: 17 mg/dL (ref 6–23)
CO2: 26 mEq/L (ref 19–32)
Calcium: 9.4 mg/dL (ref 8.4–10.5)
Chloride: 105 mEq/L (ref 96–112)
Creatinine, Ser: 1.18 mg/dL (ref 0.40–1.50)
GFR: 63.27 mL/min (ref >60.00)
Glucose, Bld: 281 mg/dL — ABNORMAL HIGH (ref 70–99)
POTASSIUM: 4.3 meq/L (ref 3.5–5.1)
SODIUM: 138 meq/L (ref 135–145)

## 2014-04-20 LAB — MICROALBUMIN / CREATININE URINE RATIO
CREATININE, U: 209.7 mg/dL
MICROALB/CREAT RATIO: 15.6 mg/g (ref 0.0–30.0)
Microalb, Ur: 32.7 mg/dL — ABNORMAL HIGH (ref 0.0–1.9)

## 2014-04-20 LAB — LIPID PANEL
CHOLESTEROL: 144 mg/dL (ref 0–200)
HDL: 58.1 mg/dL (ref >39.00)
LDL CALC: 59 mg/dL (ref 0–99)
NonHDL: 85.9
Total CHOL/HDL Ratio: 2
Triglycerides: 134 mg/dL (ref 0.0–149.0)
VLDL: 26.8 mg/dL (ref 0.0–40.0)

## 2014-04-20 LAB — HEMOGLOBIN A1C: HEMOGLOBIN A1C: 9.9 % — AB (ref 4.6–6.5)

## 2014-04-20 LAB — TSH: TSH: 1.72 u[IU]/mL (ref 0.35–4.50)

## 2014-04-25 ENCOUNTER — Ambulatory Visit: Payer: Medicare Other | Admitting: Endocrinology

## 2014-04-26 NOTE — Telephone Encounter (Signed)
error 

## 2014-04-30 ENCOUNTER — Other Ambulatory Visit: Payer: Self-pay | Admitting: Endocrinology

## 2014-05-02 ENCOUNTER — Other Ambulatory Visit: Payer: Self-pay | Admitting: *Deleted

## 2014-05-02 ENCOUNTER — Telehealth: Payer: Self-pay | Admitting: Endocrinology

## 2014-05-02 MED ORDER — GABAPENTIN 300 MG PO CAPS: 300.0000 mg | ORAL_CAPSULE | Freq: Four times a day (QID) | ORAL | Status: DC

## 2014-05-02 MED ORDER — HYDROCODONE-ACETAMINOPHEN 5-300 MG PO TABS: 1.0000 | ORAL_TABLET | Freq: Four times a day (QID) | ORAL | Status: DC | PRN

## 2014-05-02 NOTE — Telephone Encounter (Signed)
Unable to reach patient # is busy

## 2014-05-02 NOTE — Telephone Encounter (Signed)
Call pt would not leave reasoning

## 2014-05-03 ENCOUNTER — Ambulatory Visit (INDEPENDENT_AMBULATORY_CARE_PROVIDER_SITE_OTHER): Payer: Medicare Other | Admitting: Endocrinology

## 2014-05-03 ENCOUNTER — Encounter: Payer: Self-pay | Admitting: Endocrinology

## 2014-05-03 VITALS — BP 134/80 | HR 88 | Temp 97.6°F | Ht 70.0 in | Wt 201.0 lb

## 2014-05-03 DIAGNOSIS — G894 Chronic pain syndrome: Secondary | ICD-10-CM | POA: Diagnosis not present

## 2014-05-03 DIAGNOSIS — IMO0002 Reserved for concepts with insufficient information to code with codable children: Secondary | ICD-10-CM

## 2014-05-03 DIAGNOSIS — E1165 Type 2 diabetes mellitus with hyperglycemia: Secondary | ICD-10-CM

## 2014-05-03 MED ORDER — METFORMIN HCL 500 MG PO TABS: ORAL_TABLET | ORAL | Status: DC

## 2014-05-03 MED ORDER — INSULIN LISPRO PROT & LISPRO (75-25 MIX) 100 UNIT/ML KWIKPEN: PEN_INJECTOR | SUBCUTANEOUS | Status: DC

## 2014-05-03 NOTE — Progress Notes (Signed)
Patient ID: Barry Officer Sr., male   DOB: 03-Jan-1933, 81 y.o.   MRN: [id number]   Reason for Appointment: Diabetes follow-up   History of Present Illness   Diagnosis: Type 2 diabetes mellitus, date of diagnosis: 1997.   He has been on basal bolus insulin regimen for a few years.  His blood sugars are usually variably controlled and A1c is usually over 8%  except once was 7.2.  Previously had tried Victoza with some benefit but did not continue this because of perceived side effect of constipation and also out-of-pocket expense  RECENT history:   His blood sugars have been totally out of control recently with blood sugars as high as 497 His blood sugars are fairly consistently high nonfasting despite increasing his insulin  His blood sugars are showing the following patterns and has the following problems identified:  His fasting blood sugars are overall fairly good but sometimes over 200.  Sometimes does not check his fasting blood sugar until about 12 noon.  He is not understanding the need for taking Lantus only twice a day and will take additional 15 units at lunchtime because of high readings.  Continuing to take more insulin at mealtimes with Humalog frequently about 20 units.  This is despite his eating lunch and supper within 2 hours of each other in the late afternoon.  He did not start metformin as prescribed.  Prescription was sent to the [facility] as requested but he did not pick it up.  He is eating a lot of sweets like ice cream during the day causing hyperglycemia.  His wife thinks that he is doing this regularly and only when he is not at home such as yesterday his blood sugar was slightly low.  He is not exercising at all and gaining weight.  Some of his high sugars may be related to stress, anxiety and persistent pain   The insulin regimen is Humalog 15 ac-20 AC   Lantus 15 am 15-lunch 60 in pm to tal 140  Monitors blood glucose: 2.1 times  a day.   Glucometer: Precision and One Touch.  Glucose readings by download  PRE-MEAL Breakfast Lunch Dinner Bedtime Overall  Glucose range: 62-239 62-482  202-497   186-362    Mean: 150  306   300   274  235    Meals: variable;  For breakfast at 9-12 noon he will eat egg, meat, grits; occasionally pancackes; Sandwich usually at 4 pm; Eating supper around 5-7 PM; snacks in the afternoon and evening   Physical activity: exercise: none Dietician visit: Most recent:1/14.  CDE visit 4/15   Wt Readings from Last 3 Encounters:  05/03/14 201 lb (91.173 kg)  03/14/14 194 lb 12.8 oz (88.361 kg)  02/16/14 193 lb 12.8 oz (87.907 kg)   Lab Results  Component Value Date   HGBA1C 9.9* 04/20/2014   HGBA1C 11.0* 02/14/2014   HGBA1C 9.1* 09/17/2013   Lab Results  Component Value Date   MICROALBUR 32.7* 04/20/2014   LDLCALC 59 04/20/2014   CREATININE 1.18 04/20/2014         Medication List       This list is accurate as of: 05/03/14  5:07 PM.  Always use your most recent med list.               acetaminophen 500 MG tablet  Commonly known as:  TYLENOL  Take per the bottle as needed     aspirin EC 81 MG tablet  1 tablet by mouth every morning     budesonide-formoterol 160-4.5 MCG/ACT inhaler  Commonly known as:  SYMBICORT  Inhale 2 puffs into the lungs 2 (two) times daily.     famotidine 20 MG tablet  Commonly known as:  PEPCID  One at bedtime     finasteride 5 MG tablet  Commonly known as:  PROSCAR  Take 5 mg by mouth every morning.     fluticasone 50 MCG/ACT nasal spray  Commonly known as:  FLONASE  Place 2 sprays into both nostrils 2 (two) times daily.     gabapentin 300 MG capsule  Commonly known as:  NEURONTIN  Take 1 capsule (300 mg total) by mouth 4 (four) times daily.     glucose blood test strip  Commonly known as:  ONE TOUCH ULTRA TEST  Use as instructed to check blood sugars 3 times per day dx code 250.02     Hydrocodone-Acetaminophen 5-300 MG Tabs  Take 1  tablet by mouth 4 (four) times daily as needed.     insulin glargine 100 UNIT/ML injection  Commonly known as:  LANTUS  Inject 0.5 mLs (50 Units total) into the skin at bedtime.     LANTUS SOLOSTAR 100 UNIT/ML Solostar Pen  Generic drug:  Insulin Glargine  INJECT 46 UNITS SUBCUTANEOUSLY ONCE DAILY AT BEDTIME     insulin lispro 100 UNIT/ML injection  Commonly known as:  HUMALOG  Inject 0.03-0.12 mLs (3-12 Units total) into the skin 3 (three) times daily before meals.     Insulin Lispro Prot & Lispro (75-25) 100 UNIT/ML Kwikpen  Commonly known as:  HUMALOG MIX 75/25 KWIKPEN  30 units before breakfast, 20 before lunch and 30 before supper     levothyroxine 112 MCG tablet  Commonly known as:  SYNTHROID, LEVOTHROID  Take 1 tablet (112 mcg total) by mouth daily.     menthol-thymol Liqd  Apply 1 application topically as needed. For pain.     metFORMIN 500 MG tablet  Commonly known as:  GLUCOPHAGE  3 tabs daily     pantoprazole 40 MG tablet  Commonly known as:  PROTONIX  Take 1 tablet (40 mg total) by mouth daily. Take 30-60 min before first meal of the day     phenazopyridine 200 MG tablet  Commonly known as:  PYRIDIUM  - Take 1 tablet (200 mg total) by mouth 3 (three) times daily as needed for pain. For urinary burning  - Note: will stain clothing     polyethylene glycol packet  Commonly known as:  MIRALAX / GLYCOLAX  1 capful daily as needed     rosuvastatin 10 MG tablet  Commonly known as:  CRESTOR  Take 1 tablet (10 mg total) by mouth daily.     TUSSIN CF 30-10-100 MG/5ML solution  Generic drug:  pseudoephedrine-dextromethorphan-guaifenesin  Take 10 mLs by mouth every 4 (four) hours as needed for cough.     TUSSIN DM 10-100 MG/5ML liquid  Generic drug:  dextromethorphan-guaiFENesin  2 tsp every 4 hours as needed for cough        Allergies:  Allergies  Allergen Reactions  . Penicillins Swelling    Eyes and lips  . Prednisone Other (See Comments)     Dizziness     Past Medical History  Diagnosis Date  . Heart murmur   . Peripheral vascular disease   . Asthma   . Hypothyroidism   . Anemia     hx of year ago no problems now  per pt   . GERD (gastroesophageal reflux disease)   . Arthritis     generalized   . Type 2 diabetes mellitus   . Recovering alcoholic in remission   . Complication of anesthesia     POST OP HYPOTENSION  . History of chronic bronchitis   . History of pulmonary embolus (PE)     2010  . Bladder neck contracture   . History of MI (myocardial infarction)     08/ 2010  secondary acute renal failure/ dehydration--  medical management  . History of Bell's palsy     x2  1998 &  2004 left side --  residual slight left eye droop and vace  . Chronic back pain   . History of hypertension   . Prostate carcinoma urologist-  dr Gaynelle Arabian    DX 2007  Gleason 6  and recurrent 2012  s/p  cryoablation of prostate both times  . First degree heart block   . RBBB (right bundle branch block)   . History of colon polyps   . Diverticulosis of colon   . Renal calculus, bilateral   . Hypogonadism male   . History of gout   . OSA (obstructive sleep apnea)     severe per study 2007/  non-compliant cpap  . Hypotension   . History of basal cell carcinoma excision     forehead and x6 area forearms  . Atypical chest pain     Past Surgical History  Procedure Laterality Date  . Shoulder open rotator cuff repair  03/20/2011    Procedure: ROTATOR CUFF REPAIR SHOULDER OPEN;  Surgeon: Tobi Bastos;  Location: [facility];  Service: Orthopedics;  Laterality: Right;  . Hiatal hernia repair  1983  . Knee arthroscopy w/ meniscectomy Right 01-25-2003    and chondroplasty  . Inguinal hernia repair Right 10-14-2001  . Prostate cryoablation  09-17-2010  &  04-12-2005  . Repair right arm tendon injury  1975  . Lumbar fusion  [place]  . Excision benign skin lesion of back  10/ 2011  . Cardiovascular stress test  05-19-2012  dr Tressia Miners  [place]    normal perfusionstudy/  no ischemia/  attenuation artifact inferoapex region of myocardium/  note gate secondary to rhythm irregularity  . Transthoracic echocardiogram  01-11-2013  dr [name]    moderate LVH/  grade I diastolic dysfunction/ ef 56-21%/  mild AV stenosis /  mild MR/  mild LAE  . Ventral hernia repair  1998  . Colonoscopy w/ polypectomy  last one 08-10-2013  . Cataract extraction w/ intraocular lens implant Right 2014  . Tonsillectomy  as child  . Cystoscopy with urethral dilatation N/A 01/14/2014    Procedure: URETHRAL MEATAL DILATATION;  Surgeon: Ailene Rud, MD;  Location: [facility];  Service: Urology;  Laterality: N/A;  . Transurethral resection of bladder tumor N/A 01/14/2014    Procedure: TRANSURETHRAL INCISION OF BLADDER NECK CONTRACTURE;  Surgeon: Ailene Rud, MD;  Location: [facility];  Service: Urology;  Laterality: N/A;    Family History  Problem Relation Age of Onset  . Heart disease Mother   . Stroke Father   . Cancer Brother     unsure of type    Social History:  reports that he has never smoked. He has never used smokeless tobacco. He reports that he does not drink alcohol or use illicit drugs.  Review of Systems  He is taking Protonix for reflux  in the morning and Pepcid at night with control of symptoms  Renal dysfuntion: His creatinine is better and consistently okay now  with stopping losartan Also previously creatinine was 1.9 when his blood pressure was low normal  Lab Results  Component Value Date   CREATININE 1.18 04/20/2014    Blood pressure is controlled  relatively higher today: Previously was relatively low when his blood sugars were out of control.  Not taking Florinef now  He has  COPD with chronic cough, treated by the pulmonologist and is taking steroid inhaler   HYPERLIPIDEMIA: Well controlled with Crestor   Lab Results  Component Value Date   CHOL 144 04/20/2014   HDL  58.10 04/20/2014   LDLCALC 59 04/20/2014   TRIG 134.0 04/20/2014   CHOLHDL 2 04/20/2014     He was diagnosed have had sleep apnea but he has not wanted to use CPAP    History of mild hypothyroidism, usually well controlled and his dose was increased in 2015 to 112 g because of high TSH  Lab Results  Component Value Date   TSH 1.72 04/20/2014    He has multiple joint pains and takes oxycodone or Vicodin at times, still complaining about significant pain and does not take his medication on a regular basis, afraid of habituation.  He has mild valvular disease on his echo    Examination:   BP 134/80 mmHg  Pulse 88  Temp(Src) 97.6 F (36.4 C) (Oral)  Ht 5\' 10"  (1.778 m)  Wt 201 lb (91.173 kg)  BMI 28.84 kg/m2  SpO2 96%  Body mass index is 28.84 kg/(m^2).   Appears somewhat anxious. No pedal edema  Assesment/Plan:  Diabetes type 2, uncontrolled  The patient's diabetes appears to be still significantly out of control especially after meals. Most of his hyperglycemia is related to his poor diet especially when eating sweets like ice cream. He is gaining weight with increasing his insulin dose and being inactive at the same time. Also he did not start taking metformin as directed as he could not find his prescription at the drug store.  He has consistently failed to improve his diet even though his wife is trying to help him. He does not understand the actions of various insulins and on his own and is taking Lantus 3 times a day  Currently needs more mealtime insulin and may also do better with adding NPH to his daytime regimen; for simplicity will start him on premixed insulin 3 times a day with relatively larger doses at breakfast and suppertime. Explained to him the composition of premixed insulin and actions on both short-term and long-term glucose control in the daytime. Since he is taking large doses of Lantus will simplify the regimen and have him take this only in the  morning  He will also start metformin as directed and new prescription was sent. Discussed actions of metformin, possible side effects and dosage titration  He will need to be seen in follow-up in 3 weeks but he will call if he has any difficulties with control Start walking when able to  COMPLICATIONS: Has had symptoms of neuropathy, microalbumin has been recently normal.  Chronic pain: He is still complaining of significant amount of pain and does not take his hydrocodone frequently enough.  He does probably need to take a maintenance dose of analgesics and for close monitoring will have him see a pain specialist for optimal regimen and other modalities of treatment  Hypothyroidism: Will continue the same  dose as TSH is normal  HYPERLIPIDEMIA: Well controlled  Patient Instructions  LANTUS to 60 units in am only  Humalog mix insulin 30 units before breakfast, 20 before lunch and 30 before supper  Start taking Metformin 500 mg, 1 tablet with your main meal for 5 days. Occasionally this may initially cause loose stools or nausea. If  tolerating well after 5 days add a second Metformin tablet (500 mg) at the same time. Continue adding another tablet after 5 days days if no persistent nausea or diarrhea until reaching the maximum tolerated dose or the full dose of 3 tablets once daily.   Counseling time over 50% of today's 25 minute visit  Barry Mcdaniel 05/03/2014, 5:07 PM

## 2014-05-03 NOTE — Patient Instructions (Addendum)
LANTUS to 60 units in am only  Humalog mix insulin 30 units before breakfast, 20 before lunch and 30 before supper  Start taking Metformin 500 mg, 1 tablet with your main meal for 5 days. Occasionally this may initially cause loose stools or nausea. If  tolerating well after 5 days add a second Metformin tablet (500 mg) at the same time. Continue adding another tablet after 5 days days if no persistent nausea or diarrhea until reaching the maximum tolerated dose or the full dose of 3 tablets once daily.

## 2014-05-10 DIAGNOSIS — R350 Frequency of micturition: Secondary | ICD-10-CM | POA: Diagnosis not present

## 2014-05-10 DIAGNOSIS — R3915 Urgency of urination: Secondary | ICD-10-CM | POA: Diagnosis not present

## 2014-05-10 DIAGNOSIS — R800 Isolated proteinuria: Secondary | ICD-10-CM | POA: Diagnosis not present

## 2014-05-10 DIAGNOSIS — E291 Testicular hypofunction: Secondary | ICD-10-CM | POA: Diagnosis not present

## 2014-05-24 ENCOUNTER — Ambulatory Visit (INDEPENDENT_AMBULATORY_CARE_PROVIDER_SITE_OTHER): Payer: Medicare Other | Admitting: Endocrinology

## 2014-05-24 ENCOUNTER — Encounter: Payer: Self-pay | Admitting: Endocrinology

## 2014-05-24 VITALS — BP 134/88 | HR 90 | Temp 98.0°F | Resp 16 | Ht 70.0 in | Wt 203.2 lb

## 2014-05-24 DIAGNOSIS — E1165 Type 2 diabetes mellitus with hyperglycemia: Secondary | ICD-10-CM

## 2014-05-24 DIAGNOSIS — IMO0002 Reserved for concepts with insufficient information to code with codable children: Secondary | ICD-10-CM

## 2014-05-24 MED ORDER — INSULIN LISPRO PROT & LISPRO (75-25 MIX) 100 UNIT/ML KWIKPEN: PEN_INJECTOR | SUBCUTANEOUS | Status: DC

## 2014-05-24 NOTE — Patient Instructions (Addendum)
Please check blood sugars at least half the time about 2 hours after any meal and 3 times per week on waking up. Mark after meal readings Please bring blood sugar monitor to each visit. Recommended blood sugar levels about 2 hours after meal is 140-180 and on waking up 90-130  Lantus 54 units in am daily and may adjust dose every week based on am sugars

## 2014-05-24 NOTE — Progress Notes (Signed)
Patient ID: Barry Officer Sr., male   DOB: 06-10-1932, 81 y.o.   MRN: [id number]   Reason for Appointment: Diabetes follow-up   History of Present Illness   Diagnosis: Type 2 diabetes mellitus, date of diagnosis: 1997.   He has been on basal bolus insulin regimen for a few years.  His blood sugars are usually variably controlled and A1c is usually over 8%  except once was 7.2.  Previously had tried Victoza with some benefit but did not continue this because of perceived side effect of constipation and also out-of-pocket expense  RECENT history:   Insulin regimen: LANTUS insulin 60 units in am only  Humalog mix insulin 30 units before breakfast, 20 before lunch and 30 before supper   His blood sugars had been totally out of control since late 2015 with A1c up to 11% and previously also A1c over 9% He was appearing to be needing larger doses of both basal and bolus insulin Also was having significant postprandial hyperglycemia partly related to an adequate mealtime insulin and also related to poor diet but eating a lot of sweets like ice cream. Because of daytime hyperglycemia he was changed from Humalog to Humalog mix insulin before each meal He thinks that blood sugars started to improve fairly soon after the change He was also started on METFORMIN which he tried to titrate to 1500 mg However was able to only tolerate 1000 mg because of diarrhea with 3 tablets His blood sugars are showing the following patterns and has the following problems identified:  His fasting blood sugars are overall excellent but also has had low blood sugars early-morning at least twice with the lowest reading 38  Although blood sugars were somewhat higher the first few days of this month in the 6-10 PM time range the appear to be better more recently  Blood sugars in the middle of the day are fairly good and recently not as high in the afternoon also which is when he had the highest readings  previously  He has not checked readings after his evening meals more recently and does not label his postprandial readings.  Evening meal is somewhat variable times.  Monitors blood glucose: 2.4 times  a day.  Glucometer: Precision and One Touch.  Glucose readings by download  PRE-MEAL Breakfast Lunch  6 PM   7-10 PM  Overall  Glucose range:  38-147   64-126   108, 290   80-332    Mean:  100     197   132    Meals: variable;  For breakfast at 9-12 noon he will eat egg, meat, grits; occasionally pancackes; Sandwich usually at 4 pm; Eating supper around 5-7 PM; snacks in the afternoon and evening   Physical activity: exercise: none Dietician visit: Most recent:1/14.  CDE visit 4/15   Wt Readings from Last 3 Encounters:  05/24/14 203 lb 3.2 oz (92.171 kg)  05/03/14 201 lb (91.173 kg)  03/14/14 194 lb 12.8 oz (88.361 kg)   Lab Results  Component Value Date   HGBA1C 9.9* 04/20/2014   HGBA1C 11.0* 02/14/2014   HGBA1C 9.1* 09/17/2013   Lab Results  Component Value Date   MICROALBUR 32.7* 04/20/2014   LDLCALC 59 04/20/2014   CREATININE 1.18 04/20/2014         Medication List       This list is accurate as of: 05/24/14  9:33 PM.  Always use your most recent med list.  acetaminophen 500 MG tablet  Commonly known as:  TYLENOL  Take per the bottle as needed     aspirin EC 81 MG tablet  1 tablet by mouth every morning     budesonide-formoterol 160-4.5 MCG/ACT inhaler  Commonly known as:  SYMBICORT  Inhale 2 puffs into the lungs 2 (two) times daily.     famotidine 20 MG tablet  Commonly known as:  PEPCID  One at bedtime     finasteride 5 MG tablet  Commonly known as:  PROSCAR  Take 5 mg by mouth every morning.     fluticasone 50 MCG/ACT nasal spray  Commonly known as:  FLONASE  Place 2 sprays into both nostrils 2 (two) times daily.     gabapentin 300 MG capsule  Commonly known as:  NEURONTIN  Take 1 capsule (300 mg total) by mouth 4 (four) times  daily.     glucose blood test strip  Commonly known as:  ONE TOUCH ULTRA TEST  Use as instructed to check blood sugars 3 times per day dx code 250.02     Hydrocodone-Acetaminophen 5-300 MG Tabs  Take 1 tablet by mouth 4 (four) times daily as needed.     insulin glargine 100 UNIT/ML injection  Commonly known as:  LANTUS  Inject 0.5 mLs (50 Units total) into the skin at bedtime.     Insulin Lispro Prot & Lispro (75-25) 100 UNIT/ML Kwikpen  Commonly known as:  HUMALOG MIX 75/25 KWIKPEN  30 units before breakfast, 20 before lunch and 30 before supper     levothyroxine 112 MCG tablet  Commonly known as:  SYNTHROID, LEVOTHROID  Take 1 tablet (112 mcg total) by mouth daily.     menthol-thymol Liqd  Apply 1 application topically as needed. For pain.     metFORMIN 500 MG tablet  Commonly known as:  GLUCOPHAGE  3 tabs daily     pantoprazole 40 MG tablet  Commonly known as:  PROTONIX  Take 1 tablet (40 mg total) by mouth daily. Take 30-60 min before first meal of the day     phenazopyridine 200 MG tablet  Commonly known as:  PYRIDIUM  - Take 1 tablet (200 mg total) by mouth 3 (three) times daily as needed for pain. For urinary burning  - Note: will stain clothing     polyethylene glycol packet  Commonly known as:  MIRALAX / GLYCOLAX  1 capful daily as needed     rosuvastatin 10 MG tablet  Commonly known as:  CRESTOR  Take 1 tablet (10 mg total) by mouth daily.     TUSSIN CF 30-10-100 MG/5ML solution  Generic drug:  pseudoephedrine-dextromethorphan-guaifenesin  Take 10 mLs by mouth every 4 (four) hours as needed for cough.     TUSSIN DM 10-100 MG/5ML liquid  Generic drug:  dextromethorphan-guaiFENesin  2 tsp every 4 hours as needed for cough        Allergies:  Allergies  Allergen Reactions  . Penicillins Swelling    Eyes and lips  . Prednisone Other (See Comments)    Dizziness     Past Medical History  Diagnosis Date  . Heart murmur   . Peripheral vascular  disease   . Asthma   . Hypothyroidism   . Anemia     hx of year ago no problems now per pt   . GERD (gastroesophageal reflux disease)   . Arthritis     generalized   . Type 2 diabetes mellitus   . Recovering  alcoholic in remission   . Complication of anesthesia     POST OP HYPOTENSION  . History of chronic bronchitis   . History of pulmonary embolus (PE)     2010  . Bladder neck contracture   . History of MI (myocardial infarction)     08/ 2010  secondary acute renal failure/ dehydration--  medical management  . History of Bell's palsy     x2  1998 &  2004 left side --  residual slight left eye droop and vace  . Chronic back pain   . History of hypertension   . Prostate carcinoma urologist-  dr Gaynelle Arabian    DX 2007  Gleason 6  and recurrent 2012  s/p  cryoablation of prostate both times  . First degree heart block   . RBBB (right bundle branch block)   . History of colon polyps   . Diverticulosis of colon   . Renal calculus, bilateral   . Hypogonadism male   . History of gout   . OSA (obstructive sleep apnea)     severe per study 2007/  non-compliant cpap  . Hypotension   . History of basal cell carcinoma excision     forehead and x6 area forearms  . Atypical chest pain     Past Surgical History  Procedure Laterality Date  . Shoulder open rotator cuff repair  03/20/2011    Procedure: ROTATOR CUFF REPAIR SHOULDER OPEN;  Surgeon: Tobi Bastos;  Location: [facility];  Service: Orthopedics;  Laterality: Right;  . Hiatal hernia repair  1983  . Knee arthroscopy w/ meniscectomy Right 01-25-2003    and chondroplasty  . Inguinal hernia repair Right 10-14-2001  . Prostate cryoablation  09-17-2010  &  04-12-2005  . Repair right arm tendon injury  1975  . Lumbar fusion  [place]  . Excision benign skin lesion of back  10/ 2011  . Cardiovascular stress test  05-19-2012  dr Tressia Miners turner    normal perfusionstudy/  no ischemia/  attenuation artifact inferoapex region of  myocardium/  note gate secondary to rhythm irregularity  . Transthoracic echocardiogram  01-11-2013  dr [name]    moderate LVH/  grade I diastolic dysfunction/ ef 53-61%/  mild AV stenosis /  mild MR/  mild LAE  . Ventral hernia repair  1998  . Colonoscopy w/ polypectomy  last one 08-10-2013  . Cataract extraction w/ intraocular lens implant Right 2014  . Tonsillectomy  as child  . Cystoscopy with urethral dilatation N/A 01/14/2014    Procedure: URETHRAL MEATAL DILATATION;  Surgeon: Ailene Rud, MD;  Location[facility];  Service: Urology;  Laterality: N/A;  . Transurethral resection of bladder tumor N/A 01/14/2014    Procedure: TRANSURETHRAL INCISION OF BLADDER NECK CONTRACTURE;  Surgeon: Ailene Rud, MD;  Location: [facility];  Service: Urology;  Laterality: N/A;    Family History  Problem Relation Age of Onset  . Heart disease Mother   . Stroke Father   . Cancer Brother     unsure of type    Social History:  reports that he has never smoked. He has never used smokeless tobacco. He reports that he does not drink alcohol or use illicit drugs.  Review of Systems  He is taking Protonix for reflux in the morning and Pepcid at night with control of symptoms  Renal dysfuntion: His creatinine is better and fairly normal now Previously on losartan Also previously creatinine was  1.9 when his blood pressure was low normal  Lab Results  Component Value Date   CREATININE 1.18 04/20/2014    Blood pressure is controlled without any medications now  He has  COPD with chronic cough, treated by the pulmonologist and is taking steroid inhaler   HYPERLIPIDEMIA: Well controlled with Crestor   Lab Results  Component Value Date   CHOL 144 04/20/2014   HDL 58.10 04/20/2014   LDLCALC 59 04/20/2014   TRIG 134.0 04/20/2014   CHOLHDL 2 04/20/2014     He was diagnosed have had sleep apnea but he has not wanted to use CPAP    History of mild  hypothyroidism, usually well controlled and his dose was increased in 2015 to 112 g because of high TSH  Lab Results  Component Value Date   TSH 1.72 04/20/2014    He has multiple joint pains and takes oxycodone or Vicodin at times, more recently has not had as much pain and now does not feel like he needs to go to the pain clinic.  Patient apparently improving with his blood sugar control    Examination:   BP 134/88 mmHg  Pulse 90  Temp(Src) 98 F (36.7 C)  Resp 16  Ht 5\' 10"  (1.778 m)  Wt 203 lb 3.2 oz (92.171 kg)  BMI 29.16 kg/m2  SpO2 97%  Body mass index is 29.16 kg/(m^2).   Repeat blood pressure 126/72 No pedal edema  Assesment/Plan:  Diabetes type 2, uncontrolled  The patient's diabetes has improved dramatically with adding Humalog mix insulin instead of Humalog insulin at mealtimes Also fasting readings are on the low side and has had at least 2 episodes of hypoglycemia waking up Part of his improvement is also related to cutting back on sweets like ice cream Although he has not checked enough readings after his evening meal they have been somewhat variable but more recently better He probably is improving his control with taking metformin also although cannot tolerate more than 1000 mg a day  For now he will reduce his Lantus and discussed adjusting this to keep fasting blood sugars in the desired range May also consider reducing his morning premixed insulin if tending to get low before lunch  Patient Instructions  Please check blood sugars at least half the time about 2 hours after any meal and 3 times per week on waking up. Mark after meal readings Please bring blood sugar monitor to each visit. Recommended blood sugar levels about 2 hours after meal is 140-180 and on waking up 90-130  Lantus 54 units in am daily and may adjust dose every week based on am sugars     Deaundra Dupriest 05/24/2014, 9:33 PM

## 2014-06-20 ENCOUNTER — Telehealth: Payer: Self-pay | Admitting: Endocrinology

## 2014-06-20 NOTE — Telephone Encounter (Signed)
Patient need a refill of his medication Hydrocodone

## 2014-06-20 NOTE — Telephone Encounter (Signed)
Please refill as per previous order

## 2014-06-20 NOTE — Telephone Encounter (Signed)
Please read message below and advise.  

## 2014-06-21 ENCOUNTER — Other Ambulatory Visit: Payer: Self-pay | Admitting: Endocrinology

## 2014-06-21 MED ORDER — HYDROCODONE-ACETAMINOPHEN 5-300 MG PO TABS: 1.0000 | ORAL_TABLET | Freq: Four times a day (QID) | ORAL | Status: DC | PRN

## 2014-06-21 NOTE — Telephone Encounter (Signed)
Done

## 2014-06-22 ENCOUNTER — Other Ambulatory Visit: Payer: Self-pay | Admitting: *Deleted

## 2014-06-22 MED ORDER — GLUCOSE BLOOD VI STRP: ORAL_STRIP | Status: DC

## 2014-07-09 ENCOUNTER — Other Ambulatory Visit: Payer: Self-pay | Admitting: Endocrinology

## 2014-07-19 DIAGNOSIS — C44319 Basal cell carcinoma of skin of other parts of face: Secondary | ICD-10-CM | POA: Diagnosis not present

## 2014-07-20 ENCOUNTER — Other Ambulatory Visit (INDEPENDENT_AMBULATORY_CARE_PROVIDER_SITE_OTHER): Payer: Medicare Other

## 2014-07-20 DIAGNOSIS — E1165 Type 2 diabetes mellitus with hyperglycemia: Secondary | ICD-10-CM

## 2014-07-20 DIAGNOSIS — IMO0002 Reserved for concepts with insufficient information to code with codable children: Secondary | ICD-10-CM

## 2014-07-20 LAB — BASIC METABOLIC PANEL
BUN: 16 mg/dL (ref 6–23)
CHLORIDE: 106 meq/L (ref 96–112)
CO2: 22 mEq/L (ref 19–32)
Calcium: 9.6 mg/dL (ref 8.4–10.5)
Creatinine, Ser: 1.39 mg/dL (ref 0.40–1.50)
GFR: 52.34 mL/min — AB (ref >60.00)
Glucose, Bld: 240 mg/dL — ABNORMAL HIGH (ref 70–99)
POTASSIUM: 4.4 meq/L (ref 3.5–5.1)
Sodium: 137 mEq/L (ref 135–145)

## 2014-07-20 LAB — HEMOGLOBIN A1C: HEMOGLOBIN A1C: 8.2 % — AB (ref 4.6–6.5)

## 2014-07-25 ENCOUNTER — Ambulatory Visit (INDEPENDENT_AMBULATORY_CARE_PROVIDER_SITE_OTHER): Payer: Medicare Other | Admitting: Endocrinology

## 2014-07-25 ENCOUNTER — Encounter: Payer: Self-pay | Admitting: Endocrinology

## 2014-07-25 VITALS — BP 130/72 | HR 91 | Temp 97.9°F | Resp 16 | Ht 70.0 in | Wt 203.0 lb

## 2014-07-25 DIAGNOSIS — E1142 Type 2 diabetes mellitus with diabetic polyneuropathy: Secondary | ICD-10-CM

## 2014-07-25 DIAGNOSIS — G629 Polyneuropathy, unspecified: Secondary | ICD-10-CM | POA: Diagnosis not present

## 2014-07-25 DIAGNOSIS — E1165 Type 2 diabetes mellitus with hyperglycemia: Secondary | ICD-10-CM

## 2014-07-25 DIAGNOSIS — IMO0002 Reserved for concepts with insufficient information to code with codable children: Secondary | ICD-10-CM

## 2014-07-25 DIAGNOSIS — G894 Chronic pain syndrome: Secondary | ICD-10-CM

## 2014-07-25 MED ORDER — METFORMIN HCL ER 500 MG PO TB24: 1500.0000 mg | ORAL_TABLET | Freq: Every day | ORAL | Status: DC

## 2014-07-25 NOTE — Patient Instructions (Addendum)
Adjust supper insulin dose based on type and amount of food at the meal  Take insulin BEFORE EATING  Check blood sugars on waking up .. 3 .. times a week Also check blood sugars about 2 hours after a meal and do this after different meals by rotation Recommended blood sugar levels on waking up is 90-130 and about 2 hours after meal is 140-180 Please bring blood sugar monitor to each visit.  Lantus 50 units  Metformin ER 1 twice daily  May take 2 gabapentin at bedtime

## 2014-07-25 NOTE — Progress Notes (Signed)
Patient ID: Barry Officer Sr., male   DOB: 1932-12-04, 81 y.o.   MRN: [id number]   Reason for Appointment: Diabetes follow-up   History of Present Illness   Diagnosis: Type 2 diabetes mellitus, date of diagnosis: 1997.   He has been on basal bolus insulin regimen for a few years.  His blood sugars are usually variably controlled and A1c is usually over 8%  except once was 7.2.  Previously had tried Victoza with some benefit but did not continue this because of perceived side effect of constipation and also out-of-pocket expense  RECENT history:   Insulin regimen: LANTUS insulin 54 units in am only  Humalog mix insulin 30 units before breakfast, 20 before lunch and 30 before supper   His blood sugars had been totally out of control since late 2015 with A1c up to 11% and previously also A1c over 9% Because of persistent daytime hyperglycemia he was changed from Humalog to Humalog mix insulin before each meal in 2/16 Although his blood sugars initially improved dramatically after changing to the new regimen his blood sugars are again significantly higher at times A1c is improved at 8.2  He was also started on METFORMIN However was able to only tolerate 1000 mg because of diarrhea with 3 tablets and now he says he is taking only 1 tablet because of diarrhea His blood sugars are showing the following patterns and has the following problems identified:  His fasting blood sugars are sporadically high and has only a couple of good readings, not monitoring regularly  Blood sugars are randomly going very high in the middle of the day or early evenings  Most of his high readings are probably related to not watching his diet with eating more carbohydrates, sweets and also possibly stress and pain  Not clear which readings in the evenings are before supper as he has variable meal times.  Does have occasional high readings after evening meal also  Overall is checking blood sugars less  often and usually only about twice a day  He does not know why he had a low blood sugar of 46 about 2 weeks ago before supper time; he may not have had much food at that time  HYPOGLYCEMIA: His wife says that he had a low blood sugar around 2 AM on night, not documented on his record in the last 2 weeks  He may not be consistently taking his insulin before eating and may sometimes take it 10-15 minutes after eating despite his wife reminding him to be compliant.  Still not doing any exercise  Monitors blood glucose: 1-2 times  a day.  Glucometer:  One Touch.  Glucose readings by download  PRE-MEAL Breakfast Lunch Dinner  PCS  Overall  Glucose range:  95-247   193-355   46-456   124-328    Mean/median:      195/190    Meals: variable;  For breakfast at 9-12 noon he will eat egg, meat, grits; occasionally pancackes; Sandwich usually at 4 pm; Eating supper around 5-7 PM; snacks in the afternoon and evening   Physical activity: exercise: none Dietician visit: Most recent:1/14.  CDE visit 4/15   Wt Readings from Last 3 Encounters:  07/25/14 203 lb (92.08 kg)  05/24/14 203 lb 3.2 oz (92.171 kg)  05/03/14 201 lb (91.173 kg)   Lab Results  Component Value Date   HGBA1C 8.2* 07/20/2014   HGBA1C 9.9* 04/20/2014   HGBA1C 11.0* 02/14/2014   Lab Results  Component Value Date   MICROALBUR 32.7* 04/20/2014   LDLCALC 59 04/20/2014   CREATININE 1.39 07/20/2014    OTHER active problems addressed today are reviewed in review of systems     Medication List       This list is accurate as of: 07/25/14 11:59 PM.  Always use your most recent med list.               acetaminophen 500 MG tablet  Commonly known as:  TYLENOL  Take per the bottle as needed     aspirin EC 81 MG tablet  1 tablet by mouth every morning     budesonide-formoterol 160-4.5 MCG/ACT inhaler  Commonly known as:  SYMBICORT  Inhale 2 puffs into the lungs 2 (two) times daily.     famotidine 20 MG tablet    Commonly known as:  PEPCID  One at bedtime     finasteride 5 MG tablet  Commonly known as:  PROSCAR  Take 5 mg by mouth every morning.     fluticasone 50 MCG/ACT nasal spray  Commonly known as:  FLONASE  Place 2 sprays into both nostrils 2 (two) times daily.     gabapentin 300 MG capsule  Commonly known as:  NEURONTIN  Take 1 capsule (300 mg total) by mouth 4 (four) times daily.     glucose blood test strip  Commonly known as:  ONE TOUCH ULTRA TEST  USE ONE STRIP TO CHECK GLUCOSE THREE TIMES DAILY. DX: E11.65     Hydrocodone-Acetaminophen 5-300 MG Tabs  Take 1 tablet by mouth 4 (four) times daily as needed.     insulin glargine 100 UNIT/ML injection  Commonly known as:  LANTUS  Inject 0.5 mLs (50 Units total) into the skin at bedtime.     Insulin Lispro Prot & Lispro (75-25) 100 UNIT/ML Kwikpen  Commonly known as:  HUMALOG MIX 75/25 KWIKPEN  30 units before breakfast, 20 before lunch and 30 before supper     HUMALOG MIX 75/25 KWIKPEN (75-25) 100 UNIT/ML Kwikpen  Generic drug:  Insulin Lispro Prot & Lispro  INJECT 30 UNITS SUBCUTANEOUSLY BEFORE BREAKFAST, 20 UNITS BEFORE LUNCH AND 30 UNITS BEFORE SUPPER     levothyroxine 112 MCG tablet  Commonly known as:  SYNTHROID, LEVOTHROID  Take 1 tablet (112 mcg total) by mouth daily.     menthol-thymol Liqd  Apply 1 application topically as needed. For pain.     metFORMIN 500 MG 24 hr tablet  Commonly known as:  GLUCOPHAGE-XR  Take 3 tablets (1,500 mg total) by mouth daily with supper.     pantoprazole 40 MG tablet  Commonly known as:  PROTONIX  Take 1 tablet (40 mg total) by mouth daily. Take 30-60 min before first meal of the day     phenazopyridine 200 MG tablet  Commonly known as:  PYRIDIUM  - Take 1 tablet (200 mg total) by mouth 3 (three) times daily as needed for pain. For urinary burning  - Note: will stain clothing     polyethylene glycol packet  Commonly known as:  MIRALAX / GLYCOLAX  1 capful daily as needed      rosuvastatin 10 MG tablet  Commonly known as:  CRESTOR  Take 1 tablet (10 mg total) by mouth daily.     TUSSIN DM 10-100 MG/5ML liquid  Generic drug:  dextromethorphan-guaiFENesin  2 tsp every 4 hours as needed for cough        Allergies:  Allergies  Allergen Reactions  .  Penicillins Swelling    Eyes and lips  . Prednisone Other (See Comments)    Dizziness     Past Medical History  Diagnosis Date  . Heart murmur   . Peripheral vascular disease   . Asthma   . Hypothyroidism   . Anemia     hx of year ago no problems now per pt   . GERD (gastroesophageal reflux disease)   . Arthritis     generalized   . Type 2 diabetes mellitus   . Recovering alcoholic in remission   . Complication of anesthesia     POST OP HYPOTENSION  . History of chronic bronchitis   . History of pulmonary embolus (PE)     2010  . Bladder neck contracture   . History of MI (myocardial infarction)     08/ 2010  secondary acute renal failure/ dehydration--  medical management  . History of Bell's palsy     x2  1998 &  2004 left side --  residual slight left eye droop and vace  . Chronic back pain   . History of hypertension   . Prostate carcinoma urologist-  dr Gaynelle Arabian    DX 2007  Gleason 6  and recurrent 2012  s/p  cryoablation of prostate both times  . First degree heart block   . RBBB (right bundle branch block)   . History of colon polyps   . Diverticulosis of colon   . Renal calculus, bilateral   . Hypogonadism male   . History of gout   . OSA (obstructive sleep apnea)     severe per study 2007/  non-compliant cpap  . Hypotension   . History of basal cell carcinoma excision     forehead and x6 area forearms  . Atypical chest pain     Past Surgical History  Procedure Laterality Date  . Shoulder open rotator cuff repair  03/20/2011    Procedure: ROTATOR CUFF REPAIR SHOULDER OPEN;  Surgeon: Tobi Bastos;  Location: [facility];  Service: Orthopedics;  Laterality: Right;  .  Hiatal hernia repair  1983  . Knee arthroscopy w/ meniscectomy Right 01-25-2003    and chondroplasty  . Inguinal hernia repair Right 10-14-2001  . Prostate cryoablation  09-17-2010  &  04-12-2005  . Repair right arm tendon injury  1975  . Lumbar fusion  [place]  . Excision benign skin lesion of back  10/ 2011  . Cardiovascular stress test  05-19-2012  dr Tressia Miners turner    normal perfusionstudy/  no ischemia/  attenuation artifact inferoapex region of myocardium/  note gate secondary to rhythm irregularity  . Transthoracic echocardiogram  01-11-2013  dr [name]    moderate LVH/  grade I diastolic dysfunction/ ef 15-40%/  mild AV stenosis /  mild MR/  mild LAE  . Ventral hernia repair  1998  . Colonoscopy w/ polypectomy  last one 08-10-2013  . Cataract extraction w/ intraocular lens implant Right 2014  . Tonsillectomy  as child  . Cystoscopy with urethral dilatation N/A 01/14/2014    Procedure: URETHRAL MEATAL DILATATION;  Surgeon: Ailene Rud, MD;  Location: [facility];  Service: Urology;  Laterality: N/A;  . Transurethral resection of bladder tumor N/A 01/14/2014    Procedure: TRANSURETHRAL INCISION OF BLADDER NECK CONTRACTURE;  Surgeon: Ailene Rud, MD;  Location: [facility];  Service: Urology;  Laterality: N/A;    Family History  Problem Relation Age of Onset  .  Heart disease Mother   . Stroke Father   . Cancer Brother     unsure of type    Social History:  reports that he has never smoked. He has never used smokeless tobacco. He reports that he does not drink alcohol or use illicit drugs.  Review of Systems  He is taking Protonix for reflux in the morning and Pepcid at night with control of symptoms  Renal dysfuntion: His creatinine is consistently near normal now Also previously creatinine was 1.9 when his blood pressure was low normal  Lab Results  Component Value Date   CREATININE 1.39 07/20/2014    Blood pressure is  controlled without any medications   He has  COPD with chronic cough, treated by the pulmonologist and is taking steroid inhaler usually good relief of symptoms   HYPERLIPIDEMIA: Well controlled with Crestor   Lab Results  Component Value Date   CHOL 144 04/20/2014   HDL 58.10 04/20/2014   LDLCALC 59 04/20/2014   TRIG 134.0 04/20/2014   CHOLHDL 2 04/20/2014     He was diagnosed have had sleep apnea but he has not wanted to use CPAP    History of mild hypothyroidism, usually well controlled and his dose was increased in 2015 to 112 g because of high TSH  Lab Results  Component Value Date   TSH 1.72 04/20/2014    He has multiple joint pains and takes oxycodone or Vicodin at times with some relief only He again refuses to consider going to the pain clinic He says that his pain occurring at various places at different times, sometimes radiating from neck him occasionally in his legs and sometimes and knee joints He does get some relief with gabapentin and he can sometimes take an extra pill during the night  He has difficulty sleeping, partly from pain  Previously had been on Effexor for depression but does not want to consider this again    Examination:   BP 130/72 mmHg  Pulse 91  Temp(Src) 97.9 F (36.6 C)  Resp 16  Ht 5\' 10"  (1.778 m)  Wt 203 lb (92.08 kg)  BMI 29.13 kg/m2  SpO2 97%  Body mass index is 29.13 kg/(m^2).   No peripheral edema Does not have a depressed affect  Assesment/Plan:  Diabetes type 2, uncontrolled  The patient's diabetes has been again inconsistently controlled even though he has generally done better with using Humalog Mix insulin instead of Humalog with Lantus He has sporadic readings as high as 456 related to not managing his diet and getting high carbohydrate meals and snacks and sweets at times again Also taking his insulin postprandially at times instead of before eating Hypoglycemia: This has occurred twice, once before supper and  then once during the night Discussed that he can get hypoglycemia because of taking premixed insulin and he cannot adjust the dose of the individual components; however he can reduce his evening dose if he is planning to eat smaller meals  Also recommended he take his insulin right before eating for consistent effect For now to avoid hypoglycemia overnight will reduce his Lantus by 4 units Since he has some intolerance to metformin he can change to metformin ER and increase the dose to 2 tablets daily Advised him to start walking or other exercise  PAIN syndrome: He has joint pains, neuropathy and not getting relief from current regimen Discussed again referral to pain clinic but he is not keen on this For now he can increase his  gabapentin to 5-6 capsules a day and may consider changing the dose to 600 mg Continue hydrocodone as needed  Also consider adding Effexor again on his next visit  Patient Instructions  Adjust supper insulin dose based on type and amount of food at the meal  Take insulin BEFORE EATING  Check blood sugars on waking up .. 3 .. times a week Also check blood sugars about 2 hours after a meal and do this after different meals by rotation Recommended blood sugar levels on waking up is 90-130 and about 2 hours after meal is 140-180 Please bring blood sugar monitor to each visit.  Lantus 50 units  Metformin ER 1 twice daily  May take 2 gabapentin at bedtime    Peggy Monk 07/26/2014, 11:47 AM

## 2014-07-27 NOTE — Progress Notes (Signed)
Patient ID: Barry Officer Sr., male   DOB: [birth year], 81 y.o.   MRN: [id number] 81 y.o. self referred. Saw Dr Radford Pax at [place] in March 2013  and did not like the encounter. 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 Long standing history of irregularity in pulse No documented PAF  No palpitations. Reviewed multiple records form [facility]   Event monitor: PAC;s no afib  Stress myovue normal EF 66%  Echo moderte LVH moderate AS mean gradient 16 peak 23 AVA 1.23  He is currently asymptomatic. Gets some dizzyness in mid day BS ok Thinks its related to effexor and neurontin  No chest pain palpitations or dyspnea.   Echo 12/11/12 Study Conclusions  - Left ventricle: The cavity size was normal. Wall thickness was increased in a pattern of moderate LVH. Systolic function was normal. The estimated ejection fraction was in the range of 55% to 65%. Wall motion was normal; there were no regional wall motion abnormalities. Doppler parameters are consistent with abnormal left ventricular relaxation (grade 1 diastolic dysfunction). - Aortic valve: There was mild stenosis. - Mitral valve: Calcified annulus. Mildly thickened leaflets . Mild regurgitation. - Left atrium: The atrium was mildly dilated.  Hormonal Rx for prostate gives him hot flashes  In office today had 4;3 wenkebach.  No presyncope Some fatigue Activity limited by LE arthritis   ROS: Denies fever, malais, weight loss, blurry vision, decreased visual acuity, cough, sputum, SOB, hemoptysis, pleuritic pain, palpitaitons, heartburn, abdominal pain, melena, lower extremity edema, claudication, or rash.  All other systems reviewed and negative  General: Affect appropriate Healthy:  appears stated age 81: normal Neck supple with no adenopathy JVP normal no bruits no thyromegaly Lungs clear with no wheezing and good diaphragmatic motion Heart:  S1/S2 SEM murmur, no rub, gallop or click PMI normal Abdomen: benighn, BS positve, no tenderness, no AAA no bruit.  No  HSM or HJR Distal pulses intact with no bruits No edema Neuro non-focal Skin warm and dry No muscular weakness   Current Outpatient Prescriptions  Medication Sig Dispense Refill  . acetaminophen (TYLENOL) 500 MG tablet Take per the bottle as needed    . aspirin EC 81 MG tablet 1 tablet by mouth every morning    . budesonide-formoterol (SYMBICORT) 160-4.5 MCG/ACT inhaler Inhale 2 puffs into the lungs 2 (two) times daily. 3 Inhaler 3  . dextromethorphan-guaiFENesin (TUSSIN DM) 10-100 MG/5ML liquid 2 tsp every 4 hours as needed for cough    . famotidine (PEPCID) 20 MG tablet One at bedtime 90 tablet 3  . finasteride (PROSCAR) 5 MG tablet Take 5 mg by mouth every morning.     . fluticasone (FLONASE) 50 MCG/ACT nasal spray Place 2 sprays into both nostrils 2 (two) times daily.    Marland Kitchen gabapentin (NEURONTIN) 300 MG capsule Take 1 capsule (300 mg total) by mouth 4 (four) times daily. 120 capsule 5  . glucose blood (ONE TOUCH ULTRA TEST) test strip USE ONE STRIP TO CHECK GLUCOSE THREE TIMES DAILY. DX: E11.65 100 each 2  . HUMALOG MIX 75/25 KWIKPEN (75-25) 100 UNIT/ML Kwikpen INJECT 30 UNITS SUBCUTANEOUSLY BEFORE BREAKFAST, 20 UNITS BEFORE LUNCH AND 30 UNITS BEFORE SUPPER 45 mL 1  . Hydrocodone-Acetaminophen 5-300 MG TABS Take 1 tablet by mouth 4 (four) times daily as needed. 120 each 0  . insulin glargine (LANTUS) 100 UNIT/ML injection Inject 0.5 mLs (50 Units total) into the skin at bedtime. (Patient taking differently: Inject 60 Units into the skin at bedtime. ) 20 mL 3  . Insulin  Lispro Prot & Lispro (HUMALOG MIX 75/25 KWIKPEN) (75-25) 100 UNIT/ML Kwikpen 30 units before breakfast, 20 before lunch and 30 before supper 15 mL 1  . levothyroxine (SYNTHROID, LEVOTHROID) 112 MCG tablet Take 1 tablet (112 mcg total) by mouth daily. 90 tablet 3  . menthol-thymol (ABSORBINE JR) LIQD Apply 1 application topically as needed. For pain.    . metFORMIN (GLUCOPHAGE-XR) 500 MG 24 hr tablet Take 3 tablets (1,500 mg  total) by mouth daily with supper. 90 tablet 3  . pantoprazole (PROTONIX) 40 MG tablet Take 1 tablet (40 mg total) by mouth daily. Take 30-60 min before first meal of the day 90 tablet 3  . phenazopyridine (PYRIDIUM) 200 MG tablet Take 1 tablet (200 mg total) by mouth 3 (three) times daily as needed for pain. For urinary burning Note: will stain clothing 30 tablet 3  . polyethylene glycol (MIRALAX / GLYCOLAX) packet 1 capful daily as needed    . rosuvastatin (CRESTOR) 10 MG tablet Take 1 tablet (10 mg total) by mouth daily. 90 tablet 3   No current facility-administered medications for this visit.    Allergies  Penicillins and Prednisone  Electrocardiogram:  07/15/13   NSR LVH   RBBB LAFB rate 71  07/28/14  SR rate 67 4:3 wenchebach  RBBBB LAFB    Assessment and Plan Wenkebach:  Long standing bifasicular block.  On no AV nodal drugs.  F/U limited ETT to see if he developes higher grade heart block with exertion.  F/U EP Suspect he will need pacer at some time in future  AS:  Mild on last echo f/u echo next visit no change in murmur  DM:  Brittle:  Some hypoglycemic episodes  F/u Kumar  Chol:   Cholesterol is at goal.  Continue current dose of statin and diet Rx.  No myalgias or side effects.  F/U  LFT's in 6 months. Lab Results  Component Value Date   LDLCALC 59 04/20/2014           F/U with EP after ETT and with me 6 months

## 2014-07-28 ENCOUNTER — Ambulatory Visit (INDEPENDENT_AMBULATORY_CARE_PROVIDER_SITE_OTHER): Payer: Medicare Other | Admitting: Cardiovascular Disease

## 2014-07-28 ENCOUNTER — Encounter: Payer: Self-pay | Admitting: Cardiovascular Disease

## 2014-07-28 VITALS — BP 110/68 | HR 67 | Ht 70.0 in | Wt 204.8 lb

## 2014-07-28 DIAGNOSIS — I4589 Other specified conduction disorders: Secondary | ICD-10-CM | POA: Diagnosis not present

## 2014-07-28 NOTE — Patient Instructions (Signed)
Medication Instructions:  NO CHANGES  Labwork: NONE  Testing/Procedures: Your physician has requested that you have an exercise tolerance test. For further information please visit [email or web address]. Please also follow instruction sheet, as given.   Follow-Up: Your physician recommends that you schedule a follow-up appointment in: F/U WITH  EP AFTER  TREADMILL DONE  AND   3 MONTHS  WITH  DR [name]    Any Other Special Instructions Will Be Listed Below (If Applicable).

## 2014-08-04 ENCOUNTER — Encounter: Payer: Self-pay | Admitting: Physician Assistant

## 2014-08-10 ENCOUNTER — Other Ambulatory Visit: Payer: Self-pay | Admitting: *Deleted

## 2014-08-10 DIAGNOSIS — Z85828 Personal history of other malignant neoplasm of skin: Secondary | ICD-10-CM | POA: Diagnosis not present

## 2014-08-10 DIAGNOSIS — C44319 Basal cell carcinoma of skin of other parts of face: Secondary | ICD-10-CM | POA: Diagnosis not present

## 2014-08-10 MED ORDER — HYDROCODONE-ACETAMINOPHEN 5-300 MG PO TABS: 1.0000 | ORAL_TABLET | Freq: Four times a day (QID) | ORAL | Status: DC | PRN

## 2014-08-11 DIAGNOSIS — M19142 Post-traumatic osteoarthritis, left hand: Secondary | ICD-10-CM | POA: Diagnosis not present

## 2014-08-18 ENCOUNTER — Other Ambulatory Visit: Payer: Self-pay | Admitting: Endocrinology

## 2014-08-22 ENCOUNTER — Ambulatory Visit (INDEPENDENT_AMBULATORY_CARE_PROVIDER_SITE_OTHER): Payer: Medicare Other | Admitting: Endocrinology

## 2014-08-22 ENCOUNTER — Other Ambulatory Visit: Payer: Medicare Other

## 2014-08-22 ENCOUNTER — Encounter: Payer: Self-pay | Admitting: Endocrinology

## 2014-08-22 VITALS — BP 114/60 | HR 98 | Temp 98.2°F | Resp 16 | Ht 70.0 in | Wt 201.0 lb

## 2014-08-22 DIAGNOSIS — E160 Drug-induced hypoglycemia without coma: Secondary | ICD-10-CM

## 2014-08-22 DIAGNOSIS — J029 Acute pharyngitis, unspecified: Secondary | ICD-10-CM

## 2014-08-22 DIAGNOSIS — T383X5A Adverse effect of insulin and oral hypoglycemic [antidiabetic] drugs, initial encounter: Secondary | ICD-10-CM | POA: Diagnosis not present

## 2014-08-22 DIAGNOSIS — E1165 Type 2 diabetes mellitus with hyperglycemia: Secondary | ICD-10-CM | POA: Diagnosis not present

## 2014-08-22 DIAGNOSIS — IMO0002 Reserved for concepts with insufficient information to code with codable children: Secondary | ICD-10-CM

## 2014-08-22 LAB — BETA STREP SCREEN: Streptococcus, Group A Screen (Direct): NEGATIVE

## 2014-08-22 MED ORDER — VENLAFAXINE HCL ER 37.5 MG PO CP24: 37.5000 mg | ORAL_CAPSULE | Freq: Every day | ORAL | Status: DC

## 2014-08-22 NOTE — Progress Notes (Signed)
Patient ID: Barry Officer Sr., male   DOB: [birth year], 81 y.o.   MRN: [id number]   Reason for Appointment: Diabetes follow-up   History of Present Illness   Diagnosis: Type 2 diabetes mellitus, date of diagnosis: 1997.   He has been on basal bolus insulin regimen for a few years.  His blood sugars are usually variably controlled and A1c is usually over 8%  except once was 7.2.  Previously had tried Victoza with some benefit but did not continue this because of perceived side effect of constipation and also out-of-pocket expense  RECENT history:   Insulin regimen: LANTUS insulin 45 units in am only  Humalog mix insulin ? 30 units before breakfast, sometimes 20 before lunch and 20 before supper   He has had inconsistent control of his diabetes since at least late 2015 Although his blood sugars initially improved dramatically after changing  Humalog to Humalog mix insulin before each meal in 2/16 he is now starting to get significant hypoglycemia  A1c is improved at 8.2 as of 5/16  He was also started on METFORMIN for insulin resistance but recently has not taken this because he thought it was causing hypoglycemia His blood sugars are showing the following patterns and has the following problems identified:  He has had significant hypoglycemia at times especially overnight including once needing EMS assistance  He again had hypoglycemia on Saturday night probably because of taking additional insulin at suppertime when his sugar was 347  He is not getting many blood sugar test done during the day and mostly checking blood sugars at about midday when he wakes up and has breakfast  Most of his blood sugars between 7-9 PM are high; he is not usually taking his midday insulin since he is mostly eating 2 meals a day   He is sometimes taking 20 units at other times is taking 30 units at supper time and does not understand that he cannot adjust his premixed insulin based on his blood  sugar at the time of testing; also probably adjusting his morning dose based on the blood sugar  He had markedly increased blood sugars up to 563 about 10 days ago after getting a steroid injection in his wrist Still not doing any exercise  Monitors blood glucose: 1-2 times  a day.  Glucometer:  One Touch.  Glucose readings by download recently  Mean values apply above for all meters except median for One Touch  PRE-MEAL Fasting Lunch Dinner  late-night  Overall  Glucose range:  56-164   80-194   117-347   49-347    Mean/median:      161    Meals: variable;  For breakfast at 9-12 noon he will eat egg, meat, grits; occasionally pancackes; Sandwich usually at 4 pm; Eating supper around 5-7 PM; snacks in the afternoon and evening   Physical activity: exercise: none Dietician visit: Most recent:1/14.  CDE visit 4/15   Wt Readings from Last 3 Encounters:  08/22/14 201 lb (91.173 kg)  07/28/14 204 lb 12.8 oz (92.897 kg)  07/25/14 203 lb (92.08 kg)   Lab Results  Component Value Date   HGBA1C 8.2* 07/20/2014   HGBA1C 9.9* 04/20/2014   HGBA1C 11.0* 02/14/2014   Lab Results  Component Value Date   MICROALBUR 32.7* 04/20/2014   LDLCALC 59 04/20/2014   CREATININE 1.39 07/20/2014    OTHER active problems addressed today are reviewed in review of systems     Medication List  This list is accurate as of: 08/22/14 11:59 PM.  Always use your most recent med list.               acetaminophen 500 MG tablet  Commonly known as:  TYLENOL  Take per the bottle as needed     aspirin EC 81 MG tablet  1 tablet by mouth every morning     budesonide-formoterol 160-4.5 MCG/ACT inhaler  Commonly known as:  SYMBICORT  Inhale 2 puffs into the lungs 2 (two) times daily.     finasteride 5 MG tablet  Commonly known as:  PROSCAR  Take 5 mg by mouth every morning.     fluticasone 50 MCG/ACT nasal spray  Commonly known as:  FLONASE  Place 2 sprays into both nostrils 2 (two) times  daily.     gabapentin 300 MG capsule  Commonly known as:  NEURONTIN  Take 1 capsule (300 mg total) by mouth 4 (four) times daily.     glucose blood test strip  Commonly known as:  ONE TOUCH ULTRA TEST  USE ONE STRIP TO CHECK GLUCOSE THREE TIMES DAILY. DX: E11.65     HUMALOG MIX 75/25 KWIKPEN (75-25) 100 UNIT/ML Kwikpen  Generic drug:  Insulin Lispro Prot & Lispro  INJECT 30 UNITS SUBCUTANEOUSLY BEFORE BREAKFAST, 20 UNITS BEFORE LUNCH AND 30 UNITS BEFORE SUPPER     Hydrocodone-Acetaminophen 5-300 MG Tabs  Take 1 tablet by mouth 4 (four) times daily as needed.     LANTUS SOLOSTAR 100 UNIT/ML Solostar Pen  Generic drug:  Insulin Glargine  INJECT 46 UNITS SUBCUTANEOUSLY ONCE DAILY AT BEDTIME     levothyroxine 112 MCG tablet  Commonly known as:  SYNTHROID, LEVOTHROID  Take 1 tablet (112 mcg total) by mouth daily.     menthol-thymol Liqd  Apply 1 application topically as needed. For pain.     metFORMIN 500 MG 24 hr tablet  Commonly known as:  GLUCOPHAGE-XR  Take 3 tablets (1,500 mg total) by mouth daily with supper.     pantoprazole 40 MG tablet  Commonly known as:  PROTONIX  Take 1 tablet (40 mg total) by mouth daily. Take 30-60 min before first meal of the day     polyethylene glycol packet  Commonly known as:  MIRALAX / GLYCOLAX  1 capful daily as needed     rosuvastatin 10 MG tablet  Commonly known as:  CRESTOR  Take 1 tablet (10 mg total) by mouth daily.     TUSSIN DM 10-100 MG/5ML liquid  Generic drug:  dextromethorphan-guaiFENesin  2 tsp every 4 hours as needed for cough     VANTAS [place]  Inject into the skin daily. Pt has an implant of this medication.     venlafaxine XR 37.5 MG 24 hr capsule  Commonly known as:  EFFEXOR XR  Take 1 capsule (37.5 mg total) by mouth daily with breakfast.        Allergies:  Allergies  Allergen Reactions  . Penicillins Swelling    Eyes and lips  . Prednisone Other (See Comments)    Dizziness     Past Medical History    Diagnosis Date  . Heart murmur   . Peripheral vascular disease   . Asthma   . Hypothyroidism   . Anemia     hx of year ago no problems now per pt   . GERD (gastroesophageal reflux disease)   . Arthritis     generalized   . Type 2 diabetes mellitus   .  Recovering alcoholic in remission   . Complication of anesthesia     POST OP HYPOTENSION  . History of chronic bronchitis   . History of pulmonary embolus (PE)     2010  . Bladder neck contracture   . History of MI (myocardial infarction)     08/ 2010  secondary acute renal failure/ dehydration--  medical management  . History of Bell's palsy     x2  1998 &  2004 left side --  residual slight left eye droop and vace  . Chronic back pain   . History of hypertension   . Prostate carcinoma urologist-  dr Gaynelle Arabian    DX 2007  Gleason 6  and recurrent 2012  s/p  cryoablation of prostate both times  . First degree heart block   . RBBB (right bundle branch block)   . History of colon polyps   . Diverticulosis of colon   . Renal calculus, bilateral   . Hypogonadism male   . History of gout   . OSA (obstructive sleep apnea)     severe per study 2007/  non-compliant cpap  . Hypotension   . History of basal cell carcinoma excision     forehead and x6 area forearms  . Atypical chest pain     Past Surgical History  Procedure Laterality Date  . Shoulder open rotator cuff repair  03/20/2011    Procedure: ROTATOR CUFF REPAIR SHOULDER OPEN;  Surgeon: Tobi Bastos;  Location: [facility];  Service: Orthopedics;  Laterality: Right;  . Hiatal hernia repair  1983  . Knee arthroscopy w/ meniscectomy Right 01-25-2003    and chondroplasty  . Inguinal hernia repair Right 10-14-2001  . Prostate cryoablation  09-17-2010  &  04-12-2005  . Repair right arm tendon injury  1975  . Lumbar fusion  [place]  . Excision benign skin lesion of back  10/ 2011  . Cardiovascular stress test  05-19-2012  dr Tressia Miners turner    normal perfusionstudy/  no  ischemia/  attenuation artifact inferoapex region of myocardium/  note gate secondary to rhythm irregularity  . Transthoracic echocardiogram  01-11-2013  dr [name]    moderate LVH/  grade I diastolic dysfunction/ ef 96-75%/  mild AV stenosis /  mild MR/  mild LAE  . Ventral hernia repair  1998  . Colonoscopy w/ polypectomy  last one 08-10-2013  . Cataract extraction w/ intraocular lens implant Right 2014  . Tonsillectomy  as child  . Cystoscopy with urethral dilatation N/A 01/14/2014    Procedure: URETHRAL MEATAL DILATATION;  Surgeon: Ailene Rud, MD;  Location: [facility];  Service: Urology;  Laterality: N/A;  . Transurethral resection of bladder tumor N/A 01/14/2014    Procedure: TRANSURETHRAL INCISION OF BLADDER NECK CONTRACTURE;  Surgeon: Ailene Rud, MD;  Location: [facility];  Service: Urology;  Laterality: N/A;    Family History  Problem Relation Age of Onset  . Heart disease Mother   . Stroke Father   . Cancer Brother     unsure of type    Social History:  reports that he has never smoked. He has never used smokeless tobacco. He reports that he does not drink alcohol or use illicit drugs.  Review of Systems  Acute problem today: He has had severe sore throat this morning along with some difficulty with pain on swallowing.  Has not taken any OTC treatments and has had no fever  He is  taking Protonix for reflux in the morning and Pepcid at night with control of symptoms  Blood pressure is controlled without any medications   He has  COPD with chronic cough, treated by the pulmonologist and is taking steroid inhaler usually good relief of symptoms   HYPERLIPIDEMIA: Well controlled with Crestor   Lab Results  Component Value Date   CHOL 144 04/20/2014   HDL 58.10 04/20/2014   LDLCALC 59 04/20/2014   TRIG 134.0 04/20/2014   CHOLHDL 2 04/20/2014     He was diagnosed have had sleep apnea but he has not wanted to use CPAP     History of mild hypothyroidism, usually well controlled and his dose was increased in 2015 to 112 g because of high TSH  Lab Results  Component Value Date   TSH 1.72 04/20/2014    He has multiple joint pains and takes oxycodone or Vicodin at times with some relief only He does not want to be going to the pain clinic He says that his pain occurring at various places at different times, sometimes radiating from neck him occasionally in his legs and sometimes and knee joints He does get some relief with gabapentin and he was advised to take additional doses as needed  He has difficulty sleeping, partly from pain  Previously had been on Effexor for depression and again does admit to some lack of motivation and depressed mood    Examination:   BP 114/60 mmHg  Pulse 98  Temp(Src) 98.2 F (36.8 C)  Resp 16  Ht 5\' 10"  (1.778 m)  Wt 201 lb (91.173 kg)  BMI 28.84 kg/m2  SpO2 97%  Body mass index is 28.84 kg/(m^2).   He has erythema with small amounts of whitish exudate on his pharynx No peripheral edema    Assesment/Plan:  Diabetes type 2, uncontrolled  The patient's diabetes has been again inconsistently controlled even though he has generally done better with using Humalog Mix insulin instead of Humalog with Lantus For some reason he is more sensitive to insulin now and requiring lower doses and having tendency to severe hypoglycemia occasionally Most of his problem is related to his adjusting the dose based on his pre-meal blood sugar without understanding that he is taking premixed insulin and not a fast acting insulin He usually takes less insulin at breakfast or lunch which causes blood sugar to be higher at suppertime and then he takes additional 10 units to cause nocturnal hypoglycemia Previously had marked increase in blood sugars with taking only Lantus and Humalog  Discussed the actions of premixed insulin and Lantus in detail again Recommendations: To take fixed  doses of Humalog mix at breakfast and supper and if also eating lunch will have to take another 20 units Reduce Lantus to 40 units Check blood sugars more often especially after meals Also recommended he take his insulin right before eating for consistent effect His wife was shown how to use a Glucagon injection in case he is having severe hypoglycemia Will hold off his metformin for now but consider restarting on the next visit if fasting blood sugars are high Advised him to start walking or other exercise  Acute pharyngitis: He will have a streptococcal test done today at the lab  He agrees to treat his depression with adding Effexor low dose   Patient Instructions  Lantus 40 daily  Take Humalog mix same doses dailiy and not change without calling  Take 30 at bfst and 20 at supper  Check  blood sugars on waking up .. 5 .. times a week Also check blood sugars about 2 hours after a meal and do this after different meals by rotation  Recommended blood sugar levels on waking up is 90-130 and about 2 hours after meal is 140-180 Please bring blood sugar monitor to each visit.   Counseling time on subjects discussed above is over 50% of today's 25 minute visit   Koy Lamp 08/23/2014, 7:55 AM

## 2014-08-22 NOTE — Patient Instructions (Signed)
Lantus 40 daily  Take Humalog mix same doses dailiy and not change without calling  Take 30 at bfst and 20 at supper  Check blood sugars on waking up .. 5 .. times a week Also check blood sugars about 2 hours after a meal and do this after different meals by rotation  Recommended blood sugar levels on waking up is 90-130 and about 2 hours after meal is 140-180 Please bring blood sugar monitor to each visit.

## 2014-09-06 ENCOUNTER — Telehealth: Payer: Self-pay | Admitting: Endocrinology

## 2014-09-06 NOTE — Telephone Encounter (Signed)
FYI: I think he means Venlafaxine

## 2014-09-06 NOTE — Telephone Encounter (Signed)
We can try 25 mg Zoloft for depression instead

## 2014-09-06 NOTE — Telephone Encounter (Signed)
Pt calling regarding discontinuing the venlasaxine due to the headaches he is getting

## 2014-09-07 NOTE — Telephone Encounter (Signed)
Patient declines the medication at this time, would like to discuss it with you at his next visit.

## 2014-09-09 ENCOUNTER — Telehealth (HOSPITAL_COMMUNITY): Payer: Self-pay

## 2014-09-09 NOTE — Telephone Encounter (Signed)
Encounter complete. 

## 2014-09-12 ENCOUNTER — Telehealth: Payer: Self-pay | Admitting: Endocrinology

## 2014-09-12 NOTE — Telephone Encounter (Signed)
The patient's wife came into the office for discussion on his husband's condition.  She was concerned about his failing memory.  She thinks that sometimes he will forget what he said or did a few days ago.  She thinks this may be related to his taking the pain medication frequently on his own but is also concerned about possible dementia.  She agrees with neurology consultation but does not think her husband will agree.  Also she was concerned that he may be advised a pacemaker from his cardiologist.  She was apprehensive about this as a method of unnecessarily prolonging life.  However reassured her that it would be recommended only if it is definitely indicated to avoid possibility of heart block which is serious

## 2014-09-14 ENCOUNTER — Ambulatory Visit (HOSPITAL_COMMUNITY)
Admission: RE | Admit: 2014-09-14 | Discharge: 2014-09-14 | Disposition: A | Discharge status: Home / Self Care | Payer: Medicare Other | Source: Ambulatory Visit | Attending: Cardiovascular Disease | Admitting: Cardiovascular Disease

## 2014-09-14 DIAGNOSIS — I4589 Other specified conduction disorders: Secondary | ICD-10-CM | POA: Diagnosis not present

## 2014-09-14 LAB — EXERCISE TOLERANCE TEST
CSEPEDS: 3 s
CSEPEW: 2.8 METS
Exercise duration (min): 1 min
MPHR: 141 {beats}/min
Peak HR: 117 {beats}/min
Percent HR: 82 %
RPE: 15
Rest HR: 110 {beats}/min

## 2014-09-20 ENCOUNTER — Ambulatory Visit (INDEPENDENT_AMBULATORY_CARE_PROVIDER_SITE_OTHER): Payer: Medicare Other | Admitting: Endocrinology

## 2014-09-20 ENCOUNTER — Encounter: Payer: Self-pay | Admitting: Endocrinology

## 2014-09-20 VITALS — BP 138/80 | HR 95 | Temp 97.7°F | Resp 16 | Ht 70.0 in | Wt 198.6 lb

## 2014-09-20 DIAGNOSIS — E039 Hypothyroidism, unspecified: Secondary | ICD-10-CM

## 2014-09-20 DIAGNOSIS — IMO0002 Reserved for concepts with insufficient information to code with codable children: Secondary | ICD-10-CM

## 2014-09-20 DIAGNOSIS — E1165 Type 2 diabetes mellitus with hyperglycemia: Secondary | ICD-10-CM | POA: Diagnosis not present

## 2014-09-20 DIAGNOSIS — R079 Chest pain, unspecified: Secondary | ICD-10-CM

## 2014-09-20 DIAGNOSIS — R413 Other amnesia: Secondary | ICD-10-CM | POA: Diagnosis not present

## 2014-09-20 NOTE — Progress Notes (Signed)
Patient ID: Barry Officer Sr., male   DOB: June 07, 1932, 81 y.o.   MRN: [id number]   Reason for Appointment: follow-up  of various issues   History of Present Illness   Diagnosis: Type 2 diabetes mellitus, date of diagnosis: 1997.   He has been on basal bolus insulin regimen for a few years.  His blood sugars are usually variably controlled and A1c is usually over 8%  except once was 7.2.  Previously had tried Victoza with some benefit but did not continue this because of perceived side effect of constipation and also out-of-pocket expense  RECENT history:   Insulin regimen: LANTUS insulin 20-25 units in am only  Humalog mix insulin 20 units before breakfast, 20-25 after supper   He has had inconsistent control of his diabetes since at least late 2015 Although his blood sugars initially improved dramatically after changing Humalog to Humalog mix insulin before each meal in 2/16  has had inconsistent control  A1c is improved at 8.2 as of 5/16  His blood sugars are showing the following patterns and has the following problems identified:  He has had  no further hypoglycemia with reducing his evening dose  Also he has on his own cut back significantly on the Lantus insulin; most likely is requiring less insulin because of trying to watch his diet better and his weight is down slightly  He still likes to adjust his premixed insulin based on the blood sugar before eating breakfast  Although he thinks he is taking his suppertime dose BEFORE eating his wife says that he is taking this about 2 hours after eating when he checks his sugar  Most of his blood sugar testing is around midday before his first meal and these readings are reasonably good; otherwise FASTING blood sugars recently have been in the 70s early morning but otherwise are averaging about 160, highest 324  He has a couple of readings in the afternoon which are fairly good also, mostly in the 150-190 range  He has  several relatively high readings after evening meal possibly because of not taking insulin before eating, range 144-384 with average 240  Although he thinks he is not eating anything in the afternoon now on not eating a sandwich his wife thinks that he sometimes eating ice cream Still not doing any exercise  Monitors blood glucose: 1-2 times  a day.  Glucometer:  One Touch.  Glucose readings by download recently as above Overall median blood sugar is 155    Dietician visit: Most recent:1/14.  CDE visit 4/15   Wt Readings from Last 3 Encounters:  09/20/14 198 lb 9.6 oz (90.084 kg)  08/22/14 201 lb (91.173 kg)  07/28/14 204 lb 12.8 oz (92.897 kg)   Lab Results  Component Value Date   HGBA1C 8.2* 07/20/2014   HGBA1C 9.9* 04/20/2014   HGBA1C 11.0* 02/14/2014   Lab Results  Component Value Date   MICROALBUR 32.7* 04/20/2014   LDLCALC 59 04/20/2014   CREATININE 1.39 07/20/2014    OTHER active problems addressed today are reviewed in review of systems     Medication List       This list is accurate as of: 09/20/14  1:33 PM.  Always use your most recent med list.               acetaminophen 500 MG tablet  Commonly known as:  TYLENOL  Take per the bottle as needed     aspirin EC 81 MG tablet  1 tablet by mouth every morning     budesonide-formoterol 160-4.5 MCG/ACT inhaler  Commonly known as:  SYMBICORT  Inhale 2 puffs into the lungs 2 (two) times daily.     finasteride 5 MG tablet  Commonly known as:  PROSCAR  Take 5 mg by mouth every morning.     fluticasone 50 MCG/ACT nasal spray  Commonly known as:  FLONASE  Place 2 sprays into both nostrils 2 (two) times daily.     gabapentin 300 MG capsule  Commonly known as:  NEURONTIN  Take 1 capsule (300 mg total) by mouth 4 (four) times daily.     glucose blood test strip  Commonly known as:  ONE TOUCH ULTRA TEST  USE ONE STRIP TO CHECK GLUCOSE THREE TIMES DAILY. DX: E11.65     HUMALOG MIX 75/25 KWIKPEN (75-25)  100 UNIT/ML Kwikpen  Generic drug:  Insulin Lispro Prot & Lispro  INJECT 30 UNITS SUBCUTANEOUSLY BEFORE BREAKFAST, 20 UNITS BEFORE LUNCH AND 30 UNITS BEFORE SUPPER     Hydrocodone-Acetaminophen 5-300 MG Tabs  Take 1 tablet by mouth 4 (four) times daily as needed.     LANTUS SOLOSTAR 100 UNIT/ML Solostar Pen  Generic drug:  Insulin Glargine  INJECT 46 UNITS SUBCUTANEOUSLY ONCE DAILY AT BEDTIME     levothyroxine 112 MCG tablet  Commonly known as:  SYNTHROID, LEVOTHROID  Take 1 tablet (112 mcg total) by mouth daily.     menthol-thymol Liqd  Apply 1 application topically as needed. For pain.     metFORMIN 500 MG 24 hr tablet  Commonly known as:  GLUCOPHAGE-XR  Take 3 tablets (1,500 mg total) by mouth daily with supper.     pantoprazole 40 MG tablet  Commonly known as:  PROTONIX  Take 1 tablet (40 mg total) by mouth daily. Take 30-60 min before first meal of the day     polyethylene glycol packet  Commonly known as:  MIRALAX / GLYCOLAX  1 capful daily as needed     rosuvastatin 10 MG tablet  Commonly known as:  CRESTOR  Take 1 tablet (10 mg total) by mouth daily.     TUSSIN DM 10-100 MG/5ML liquid  Generic drug:  dextromethorphan-guaiFENesin  2 tsp every 4 hours as needed for cough     VANTAS [place]  Inject into the skin daily. Pt has an implant of this medication.     venlafaxine XR 37.5 MG 24 hr capsule  Commonly known as:  EFFEXOR XR  Take 1 capsule (37.5 mg total) by mouth daily with breakfast.        Allergies:  Allergies  Allergen Reactions  . Penicillins Swelling    Eyes and lips  . Prednisone Other (See Comments)    Dizziness     Past Medical History  Diagnosis Date  . Heart murmur   . Peripheral vascular disease   . Asthma   . Hypothyroidism   . Anemia     hx of year ago no problems now per pt   . GERD (gastroesophageal reflux disease)   . Arthritis     generalized   . Type 2 diabetes mellitus   . Recovering alcoholic in remission   . Complication  of anesthesia     POST OP HYPOTENSION  . History of chronic bronchitis   . History of pulmonary embolus (PE)     2010  . Bladder neck contracture   . History of MI (myocardial infarction)     08/ 2010  secondary acute  renal failure/ dehydration--  medical management  . History of Bell's palsy     x2  1998 &  2004 left side --  residual slight left eye droop and vace  . Chronic back pain   . History of hypertension   . Prostate carcinoma urologist-  dr Gaynelle Arabian    DX 2007  Gleason 6  and recurrent 2012  s/p  cryoablation of prostate both times  . First degree heart block   . RBBB (right bundle branch block)   . History of colon polyps   . Diverticulosis of colon   . Renal calculus, bilateral   . Hypogonadism male   . History of gout   . OSA (obstructive sleep apnea)     severe per study 2007/  non-compliant cpap  . Hypotension   . History of basal cell carcinoma excision     forehead and x6 area forearms  . Atypical chest pain     Past Surgical History  Procedure Laterality Date  . Shoulder open rotator cuff repair  03/20/2011    Procedure: ROTATOR CUFF REPAIR SHOULDER OPEN;  Surgeon: Tobi Bastos;  Location: [facility];  Service: Orthopedics;  Laterality: Right;  . Hiatal hernia repair  1983  . Knee arthroscopy w/ meniscectomy Right 01-25-2003    and chondroplasty  . Inguinal hernia repair Right 10-14-2001  . Prostate cryoablation  09-17-2010  &  04-12-2005  . Repair right arm tendon injury  1975  . Lumbar fusion  [place]  . Excision benign skin lesion of back  10/ 2011  . Cardiovascular stress test  05-19-2012  dr Tressia Miners turner    normal perfusionstudy/  no ischemia/  attenuation artifact inferoapex region of myocardium/  note gate secondary to rhythm irregularity  . Transthoracic echocardiogram  01-11-2013  dr [name]    moderate LVH/  grade I diastolic dysfunction/ ef 16-96%/  mild AV stenosis /  mild MR/  mild LAE  . Ventral hernia repair  1998  . Colonoscopy w/  polypectomy  last one 08-10-2013  . Cataract extraction w/ intraocular lens implant Right 2014  . Tonsillectomy  as child  . Cystoscopy with urethral dilatation N/A 01/14/2014    Procedure: URETHRAL MEATAL DILATATION;  Surgeon: Ailene Rud, MD;  Location: [facility];  Service: Urology;  Laterality: N/A;  . Transurethral resection of bladder tumor N/A 01/14/2014    Procedure: TRANSURETHRAL INCISION OF BLADDER NECK CONTRACTURE;  Surgeon: Ailene Rud, MD;  Location: [facility];  Service: Urology;  Laterality: N/A;    Family History  Problem Relation Age of Onset  . Heart disease Mother   . Stroke Father   . Cancer Brother     unsure of type    Social History:  reports that he has never smoked. He has never used smokeless tobacco. He reports that he does not drink alcohol or use illicit drugs.  Review of Systems   His wife thinks that he has difficulty with memory and short-term recall.  He has conflicting answers when he is asking about whether he is taking his insulin before eating or after eating and his wife also thinks that sometimes he will forget conversations he has had a few days before  He is not  taking Protonix for reflux  recently and appears to be adjusting his medications on his own He says that when he is first lying down at bedtime he will feel a little discomfort in his  lower chest which goes away in 15 minutes.  No discomfort any other time.  He thinks this is from drinking cold water  Blood pressure is controlled without any medications   He has  COPD with chronic cough, treated by the pulmonologist and is taking steroid inhaler usually good relief of symptoms   HYPERLIPIDEMIA: Well controlled with Crestor   Lab Results  Component Value Date   CHOL 144 04/20/2014   HDL 58.10 04/20/2014   LDLCALC 59 04/20/2014   TRIG 134.0 04/20/2014   CHOLHDL 2 04/20/2014     He was diagnosed have had sleep apnea but he has not  wanted to use CPAP    History of mild hypothyroidism, usually well controlled and his dose was increased in 2015 to 112 g because of high TSH  Lab Results  Component Value Date   TSH 1.72 04/20/2014    He has multiple joint pains and takes oxycodone or Vicodin at times with some relief only He does not want to be going to the pain clinic He says that his pain occurring at various places at different times, sometimes radiating from neck him occasionally in his legs and sometimes and knee joints He does get some relief with gabapentin and he was advised to take additional doses as needed  Previously had been on Effexor for depression and although he tried this again he did not like the side effects and stopped it.  Refused to take Zoloft     Examination:   BP 138/80 mmHg  Pulse 95  Temp(Src) 97.7 F (36.5 C)  Resp 16  Ht 5\' 10"  (1.778 m)  Wt 198 lb 9.6 oz (90.084 kg)  BMI 28.50 kg/m2  SpO2 95%  Body mass index is 28.5 kg/(m^2).   Abdominal exam is normal without any mass palpable or tenderness No lymphadenopathy in the neck   Assesment/Plan:  Diabetes type 2, uncontrolled  See history of present illness for detailed discussion of his current management, blood sugar patterns and problems identified Educated the patient in detail today about the actions of the mixed and basal insulin Discussed timing of the premixed insulin and need to avoid adjusting it based on pre-meal blood sugar Given him specific instructions on how much and when to take on his insulin doses  Advised him to check blood sugars more consistently by rotation at various times before and after meals   Also after much discussion he agrees to see the neurologist for his MEMORY difficulties   DEPRESSION: This appears to be somewhat better now  Chest discomfort: Likely to be from reflux as it occurs only on lying down initially and advised him to go back on Protonix.   Patient Instructions  Lantus 20  units at Breakfast  Humalog mix 20 at 1st meal and 25 BEFORE supper  Check some sugars 2 hrs after Bfst also  Counseling time on subjects discussed above is over 50% of today's 25 minute visit   Estevon Fluke 09/20/2014, 1:33 PM

## 2014-09-20 NOTE — Patient Instructions (Addendum)
Lantus 20 units at Breakfast  Humalog mix 20 at 1st meal and 25 BEFORE supper  Check some sugars 2 hrs after Bfst also

## 2014-09-26 ENCOUNTER — Other Ambulatory Visit: Payer: Self-pay | Admitting: *Deleted

## 2014-09-26 MED ORDER — HYDROCODONE-ACETAMINOPHEN 5-300 MG PO TABS: 1.0000 | ORAL_TABLET | Freq: Four times a day (QID) | ORAL | Status: DC | PRN

## 2014-09-30 ENCOUNTER — Ambulatory Visit: Payer: BLUE CROSS/BLUE SHIELD | Admitting: Physician Assistant

## 2014-10-03 DIAGNOSIS — C61 Malignant neoplasm of prostate: Secondary | ICD-10-CM | POA: Diagnosis not present

## 2014-10-06 DIAGNOSIS — Z961 Presence of intraocular lens: Secondary | ICD-10-CM | POA: Diagnosis not present

## 2014-10-06 DIAGNOSIS — H02204 Unspecified lagophthalmos left upper eyelid: Secondary | ICD-10-CM | POA: Diagnosis not present

## 2014-10-06 DIAGNOSIS — H26493 Other secondary cataract, bilateral: Secondary | ICD-10-CM | POA: Diagnosis not present

## 2014-10-06 DIAGNOSIS — H02202 Unspecified lagophthalmos right lower eyelid: Secondary | ICD-10-CM | POA: Diagnosis not present

## 2014-10-06 DIAGNOSIS — H02205 Unspecified lagophthalmos left lower eyelid: Secondary | ICD-10-CM | POA: Diagnosis not present

## 2014-10-06 DIAGNOSIS — E119 Type 2 diabetes mellitus without complications: Secondary | ICD-10-CM | POA: Diagnosis not present

## 2014-10-06 DIAGNOSIS — H02201 Unspecified lagophthalmos right upper eyelid: Secondary | ICD-10-CM | POA: Diagnosis not present

## 2014-10-06 DIAGNOSIS — H524 Presbyopia: Secondary | ICD-10-CM | POA: Diagnosis not present

## 2014-10-06 DIAGNOSIS — H1132 Conjunctival hemorrhage, left eye: Secondary | ICD-10-CM | POA: Diagnosis not present

## 2014-10-10 DIAGNOSIS — N200 Calculus of kidney: Secondary | ICD-10-CM | POA: Diagnosis not present

## 2014-10-10 DIAGNOSIS — E291 Testicular hypofunction: Secondary | ICD-10-CM | POA: Diagnosis not present

## 2014-10-10 DIAGNOSIS — C61 Malignant neoplasm of prostate: Secondary | ICD-10-CM | POA: Diagnosis not present

## 2014-10-11 ENCOUNTER — Ambulatory Visit (INDEPENDENT_AMBULATORY_CARE_PROVIDER_SITE_OTHER): Payer: Medicare Other | Admitting: Internal Medicine

## 2014-10-11 ENCOUNTER — Encounter: Payer: Self-pay | Admitting: Internal Medicine

## 2014-10-11 VITALS — BP 136/80 | HR 93 | Ht 71.0 in | Wt 194.0 lb

## 2014-10-11 DIAGNOSIS — I441 Atrioventricular block, second degree: Secondary | ICD-10-CM | POA: Diagnosis not present

## 2014-10-11 NOTE — Progress Notes (Signed)
ELECTROPHYSIOLOGY CONSULT NOTE  Patient ID: Barry Mcdaniel., MRN: [id number], DOB/AGE: 79/16/1934 79 y.o. Admit date: (Not on file) Date of Consult: 10/11/2014  Primary Physician: [name], MD Primary Cardiologist: [email or web address]  Chief Complaint: abnormal ECG   HPI Barry Odor Sato Sr. is a 81 y.o. male  Referred because of significant conduction system disease with a history of bifascicular block and Mobitz 1 second-degree heart block  He denies a history of palpitations. He has had no syncope. He has had 2 "hypoglycemic" seizures. The latter was associated with a documented blood sugar by EMS apparently of 38. These records are pending.  He does not have presyncope.  He has modest exercise intolerance but this is ascribed to his arthritis. He denies chest pain or edema. He does have some shortness of breath.  He has some dizziness when is not episodic; he attributed to his medications  Cardiac evaluation the past has included an echocardiogram 10/14 with a mean gradient of 16 normal LV function moderate left ventricular hypertrophy mild left atrial enlargement  Previous event recorder had demonstrated PACs-4/14 was reviewed. There Nocturnal high-grade heart block and daytime 2-1 heart block  He has a history of prostate cancer  Past Medical History  Diagnosis Date  . Heart murmur   . Peripheral vascular disease   . Asthma   . Hypothyroidism   . Anemia     hx of year ago no problems now per pt   . GERD (gastroesophageal reflux disease)   . Arthritis     generalized   . Type 2 diabetes mellitus   . Recovering alcoholic in remission   . Complication of anesthesia     POST OP HYPOTENSION  . History of chronic bronchitis   . History of pulmonary embolus (PE)     2010  . Bladder neck contracture   . History of MI (myocardial infarction)     08/ 2010  secondary acute renal failure/ dehydration--  medical management  . History of Bell's palsy     x2  1998 &  2004 left side --   residual slight left eye droop and vace  . Chronic back pain   . History of hypertension   . Prostate carcinoma urologist-  dr Gaynelle Arabian    DX 2007  Gleason 6  and recurrent 2012  s/p  cryoablation of prostate both times  . First degree heart block   . RBBB (right bundle branch block)   . History of colon polyps   . Diverticulosis of colon   . Renal calculus, bilateral   . Hypogonadism male   . History of gout   . OSA (obstructive sleep apnea)     severe per study 2007/  non-compliant cpap  . Hypotension   . History of basal cell carcinoma excision     forehead and x6 area forearms  . Atypical chest pain       Surgical History:  Past Surgical History  Procedure Laterality Date  . Shoulder open rotator cuff repair  03/20/2011    Procedure: ROTATOR CUFF REPAIR SHOULDER OPEN;  Surgeon: Tobi Bastos;  Location: [facility];  Service: Orthopedics;  Laterality: Right;  . Hiatal hernia repair  1983  . Knee arthroscopy w/ meniscectomy Right 01-25-2003    and chondroplasty  . Inguinal hernia repair Right 10-14-2001  . Prostate cryoablation  09-17-2010  &  04-12-2005  . Repair right arm tendon injury  1975  . Lumbar fusion  1992  &  1989  . Excision benign skin lesion of back  10/ 2011  . Cardiovascular stress test  05-19-2012  dr Tressia Miners turner    normal perfusionstudy/  no ischemia/  attenuation artifact inferoapex region of myocardium/  note gate secondary to rhythm irregularity  . Transthoracic echocardiogram  01-11-2013  dr [name]    moderate LVH/  grade I diastolic dysfunction/ ef 87-56%/  mild AV stenosis /  mild MR/  mild LAE  . Ventral hernia repair  1998  . Colonoscopy w/ polypectomy  last one 08-10-2013  . Cataract extraction w/ intraocular lens implant Right 2014  . Tonsillectomy  as child  . Cystoscopy with urethral dilatation N/A 01/14/2014    Procedure: URETHRAL MEATAL DILATATION;  Surgeon: Ailene Rud, MD;  Location: [facility];  Service: Urology;   Laterality: N/A;  . Transurethral resection of bladder tumor N/A 01/14/2014    Procedure: TRANSURETHRAL INCISION OF BLADDER NECK CONTRACTURE;  Surgeon: Ailene Rud, MD;  [facility];  Service: Urology;  Laterality: N/A;     Home Meds: Prior to Admission medications   Medication Sig Start Date End Date Taking? Authorizing Provider  acetaminophen (TYLENOL) 500 MG tablet Take per the bottle as needed   Yes Historical Provider, MD  aspirin EC 81 MG tablet 1 tablet by mouth every morning   Yes Historical Provider, MD  budesonide-formoterol (SYMBICORT) 160-4.5 MCG/ACT inhaler Inhale 2 puffs into the lungs 2 (two) times daily. 03/08/14  Yes Tanda Rockers, MD  dextromethorphan-guaiFENesin (TUSSIN DM) 10-100 MG/5ML liquid 2 tsp every 4 hours as needed for cough   Yes Historical Provider, MD  finasteride (PROSCAR) 5 MG tablet Take 5 mg by mouth every morning.    Yes Historical Provider, MD  gabapentin (NEURONTIN) 300 MG capsule Take 1 capsule (300 mg total) by mouth 4 (four) times daily. 05/02/14  Yes [name], MD  glucose blood (ONE TOUCH ULTRA TEST) test strip USE ONE STRIP TO CHECK GLUCOSE THREE TIMES DAILY. DX: E11.65 06/22/14  Yes [name], MD  Histrelin Acetate (VANTAS [place]) Inject into the skin daily. Pt has an implant of this medication.   Yes Historical Provider, MD  HUMALOG MIX 75/25 KWIKPEN (75-25) 100 UNIT/ML Kwikpen INJECT 30 UNITS SUBCUTANEOUSLY BEFORE BREAKFAST, 20 UNITS BEFORE LUNCH AND 30 UNITS BEFORE SUPPER Patient taking differently: INJECT 20 UNITS SUBCUTANEOUSLY BEFORE BREAKFAST, 25 UNITS BEFORE LUNCH AND 30 UNITS BEFORE SUPPER 07/11/14  Yes [name], MD  Hydrocodone-Acetaminophen 5-300 MG TABS Take 1 tablet by mouth 4 (four) times daily as needed. 09/26/14  Yes [name], MD  LANTUS SOLOSTAR 100 UNIT/ML Solostar Pen INJECT 46 UNITS SUBCUTANEOUSLY ONCE DAILY AT BEDTIME Patient taking differently: INJECT 25-30UNITS SUBCUTANEOUSLY ONCE DAILY AT BEDTIME  08/18/14  Yes [name], MD  levothyroxine (SYNTHROID, LEVOTHROID) 112 MCG tablet Take 1 tablet (112 mcg total) by mouth daily. 02/16/14  Yes [name], MD  menthol-thymol (ABSORBINE JR) LIQD Apply 1 application topically as needed. For pain.   Yes Historical Provider, MD  metFORMIN (GLUCOPHAGE-XR) 500 MG 24 hr tablet Take 3 tablets (1,500 mg total) by mouth daily with supper. 07/25/14  Yes [name], MD  pantoprazole (PROTONIX) 40 MG tablet Take 1 tablet (40 mg total) by mouth daily. Take 30-60 min before first meal of the day 03/08/14  Yes Tanda Rockers, MD  polyethylene glycol (MIRALAX / GLYCOLAX) packet 1 capful daily as needed   Yes Historical Provider, MD  rosuvastatin (CRESTOR) 10 MG tablet Take 1  tablet (10 mg total) by mouth daily. 02/08/14  Yes [name], MD  triamcinolone (NASACORT AQ) 55 MCG/ACT AERO nasal inhaler Place 2 sprays into the nose daily.   Yes Historical Provider, MD  venlafaxine XR (EFFEXOR XR) 37.5 MG 24 hr capsule Take 1 capsule (37.5 mg total) by mouth daily with breakfast. 08/22/14  Yes [name], MD    *  Allergies:  Allergies  Allergen Reactions  . Penicillins Swelling    Eyes and lips  . Prednisone Other (See Comments)    Dizziness     History   Social History  . Marital Status: Married    Spouse Name: N/A  . Number of Children: N/A  . Years of Education: N/A   Occupational History  . Retired      Personal assistant    Social History Main Topics  . Smoking status: Never Smoker   . Smokeless tobacco: Never Used  . Alcohol Use: No     Comment: RECOVERING ALCOHOLIC none since 9562   . Drug Use: No  . Sexual Activity: Not on file   Other Topics Concern  . Not on file   Social History Narrative     Family History  Problem Relation Age of Onset  . Heart disease Mother   . Stroke Father   . Cancer Brother     unsure of type     ROS:  Please see the history of present illness.   +cough   All other systems reviewed and negative.    Physical  Exam: Blood pressure 136/80, pulse 93, height 5\' 11"  (1.803 m), weight 194 lb (87.998 kg). General: Well developed, well nourished male in no acute distress. Head: Normocephalic, atraumatic, sclera non-icteric, no xanthomas, nares are without discharge. EENT: normal Lymph Nodes:  none Back: without scoliosis/kyphosis , no CVA tendersness Neck: Negative for carotid bruits. JVD not elevated. Lungs: Clear bilaterally to auscultation without wheezes, rales, or rhonchi. Breathing is unlabored. Heart: RRR with S1 quiet  S2.  2/6 systolic murmur , rubs, or gallops appreciated. Abdomen: Soft, non-tender, non-distended with normoactive bowel sounds. No hepatomegaly. No rebound/guarding. No obvious abdominal masses. Msk:  Strength and tone appear normal for age. Extremities: No clubbing or cyanosis. No  edema.  Distal pedal pulses are 2+ and equal bilaterally. Skin: Warm and Dry Neuro: Alert and oriented X 3. CN III-XII intact Grossly normal sensory and motor function . Psych:  Responds to questions appropriately with a normal affect.      Labs: Cardiac Enzymes No results for input(s): CKTOTAL, CKMB, TROPONINI in the last 72 hours. CBC Lab Results  Component Value Date   WBC 9.1 09/17/2013   HGB 13.6 01/14/2014   HCT 40.0 01/14/2014   MCV 85.6 09/17/2013   PLT 167.0 09/17/2013   PROTIME: No results for input(s): LABPROT, INR in the last 72 hours. Chemistry No results for input(s): NA, K, CL, CO2, BUN, CREATININE, CALCIUM, PROT, BILITOT, ALKPHOS, ALT, AST, GLUCOSE in the last 168 hours.  Invalid input(s): LABALBU Lipids Lab Results  Component Value Date   CHOL 144 04/20/2014   HDL 58.10 04/20/2014   LDLCALC 59 04/20/2014   TRIG 134.0 04/20/2014   BNP No results found for: PROBNP Thyroid Function Tests: No results for input(s): TSH, T4TOTAL, T3FREE, THYROIDAB in the last 72 hours.  Invalid input(s): FREET3    Miscellaneous Lab Results  Component Value Date   DDIMER *  10/18/2008    0.58        AT  THE INHOUSE ESTABLISHED CUTOFF VALUE OF 0.48 ug/mL FEU, THIS ASSAY HAS BEEN DOCUMENTED IN THE LITERATURE TO HAVE A SENSITIVITY AND NEGATIVE PREDICTIVE VALUE OF AT LEAST 98 TO 99%.  THE TEST RESULT SHOULD BE CORRELATED WITH AN ASSESSMENT OF THE CLINICAL PROBABILITY OF DVT / VTE.    Radiology/Studies:  No results found.  EKG: Sinus rhythm at 90 Intervals 44/12/39 Axis left -68 Right bundle branch block left anterior fascicular block   Assessment and Plan:   Bifascicular block with first degree AV block  Mobitz 1 second-degree heart block  Poor exercise tolerance  Diabetes with a history of diabetic seizure"  The patient has bifascicular block with profound first degree AV block but without symptoms clearly attributable to this. He has poor exercise tolerance which might be related to essentially simultaneous activation. She does not describe himself as significantly limited not withstanding his poor performance on the Bruce protocol.  We have reviewed the physiology of profound first degree AV block and a concern was related to trifascicular block. He is advised to let us know if he develops symptoms of lightheadedness or syncope. We are working on getting the records from EMS to look at the vital signs associated with the aforementioned "seizure" (events       Virl Axe

## 2014-10-11 NOTE — Patient Instructions (Signed)
Medication Instructions:  Your physician recommends that you continue on your current medications as directed. Please refer to the Current Medication list given to you today.  Labwork: None ordered  Testing/Procedures: None ordered  Follow-Up: No follow up is needed at this time with Dr. Klein.  He will see you on an as needed basis.  Any Other Special Instructions Will Be Listed Below (If Applicable). Thank you for choosing [facility]!!          

## 2014-10-14 ENCOUNTER — Telehealth: Payer: Self-pay | Admitting: Internal Medicine

## 2014-10-14 NOTE — Telephone Encounter (Signed)
New message    Pt wife has questions regarding pt, didn't ask all the questions they had when pt was in earlier this week Please call to discuss

## 2014-10-14 NOTE — Telephone Encounter (Signed)
Left message with wife for patient to call me back, when he gets up from resting, and give me permission to speak with her (no DPR on file). She will have him call me when he wakes up.

## 2014-10-25 DIAGNOSIS — E1042 Type 1 diabetes mellitus with diabetic polyneuropathy: Secondary | ICD-10-CM | POA: Diagnosis not present

## 2014-11-08 ENCOUNTER — Ambulatory Visit: Payer: BLUE CROSS/BLUE SHIELD | Admitting: Neurology

## 2014-11-08 DIAGNOSIS — Z85828 Personal history of other malignant neoplasm of skin: Secondary | ICD-10-CM | POA: Diagnosis not present

## 2014-11-08 DIAGNOSIS — L570 Actinic keratosis: Secondary | ICD-10-CM | POA: Diagnosis not present

## 2014-11-09 ENCOUNTER — Other Ambulatory Visit: Payer: Self-pay | Admitting: *Deleted

## 2014-11-09 ENCOUNTER — Telehealth: Payer: Self-pay | Admitting: Endocrinology

## 2014-11-09 MED ORDER — HYDROCODONE-ACETAMINOPHEN 5-300 MG PO TABS: 1.0000 | ORAL_TABLET | Freq: Four times a day (QID) | ORAL | Status: DC | PRN

## 2014-11-09 NOTE — Telephone Encounter (Signed)
rx is ready to be picked up, patient is aware

## 2014-11-09 NOTE — Telephone Encounter (Signed)
Pt needs refill hyrdocodene , please call when ready for pick up

## 2014-11-21 ENCOUNTER — Other Ambulatory Visit (INDEPENDENT_AMBULATORY_CARE_PROVIDER_SITE_OTHER): Payer: Medicare Other

## 2014-11-21 ENCOUNTER — Other Ambulatory Visit: Payer: BLUE CROSS/BLUE SHIELD

## 2014-11-21 DIAGNOSIS — E1165 Type 2 diabetes mellitus with hyperglycemia: Secondary | ICD-10-CM

## 2014-11-21 DIAGNOSIS — E039 Hypothyroidism, unspecified: Secondary | ICD-10-CM

## 2014-11-21 DIAGNOSIS — IMO0002 Reserved for concepts with insufficient information to code with codable children: Secondary | ICD-10-CM

## 2014-11-21 LAB — COMPREHENSIVE METABOLIC PANEL
ALBUMIN: 3.7 g/dL (ref 3.5–5.2)
ALK PHOS: 93 U/L (ref 39–117)
ALT: 7 U/L (ref 0–53)
AST: 12 U/L (ref 0–37)
BUN: 17 mg/dL (ref 6–23)
CO2: 26 mEq/L (ref 19–32)
Calcium: 9.1 mg/dL (ref 8.4–10.5)
Chloride: 109 mEq/L (ref 96–112)
Creatinine, Ser: 1.19 mg/dL (ref 0.40–1.50)
GFR: 62.56 mL/min (ref >60.00)
GLUCOSE: 127 mg/dL — AB (ref 70–99)
POTASSIUM: 4.7 meq/L (ref 3.5–5.1)
Sodium: 144 mEq/L (ref 135–145)
TOTAL PROTEIN: 6.8 g/dL (ref 6.0–8.3)
Total Bilirubin: 0.5 mg/dL (ref 0.2–1.2)

## 2014-11-21 LAB — TSH: TSH: 4.31 u[IU]/mL (ref 0.35–4.50)

## 2014-11-21 LAB — T4, FREE: Free T4: 0.84 ng/dL (ref 0.60–1.60)

## 2014-11-21 LAB — HEMOGLOBIN A1C: HEMOGLOBIN A1C: 8 % — AB (ref 4.6–6.5)

## 2014-11-24 ENCOUNTER — Ambulatory Visit (INDEPENDENT_AMBULATORY_CARE_PROVIDER_SITE_OTHER): Payer: Medicare Other | Admitting: Endocrinology

## 2014-11-24 ENCOUNTER — Encounter: Payer: Self-pay | Admitting: Endocrinology

## 2014-11-24 VITALS — BP 112/62 | HR 93 | Temp 98.2°F | Resp 16 | Ht 71.0 in | Wt 190.8 lb

## 2014-11-24 DIAGNOSIS — E1165 Type 2 diabetes mellitus with hyperglycemia: Secondary | ICD-10-CM | POA: Diagnosis not present

## 2014-11-24 DIAGNOSIS — E039 Hypothyroidism, unspecified: Secondary | ICD-10-CM | POA: Diagnosis not present

## 2014-11-24 DIAGNOSIS — IMO0002 Reserved for concepts with insufficient information to code with codable children: Secondary | ICD-10-CM

## 2014-11-24 DIAGNOSIS — Z23 Encounter for immunization: Secondary | ICD-10-CM | POA: Diagnosis not present

## 2014-11-24 DIAGNOSIS — R413 Other amnesia: Secondary | ICD-10-CM | POA: Diagnosis not present

## 2014-11-24 NOTE — Progress Notes (Signed)
Patient ID: Barry Officer Sr., male   DOB: 04/14/1932, 81 y.o.   MRN: [id number]   Reason for Appointment: follow-up  of various issues   History of Present Illness   Diagnosis: Type 2 diabetes mellitus, date of diagnosis: 1997.   He has been on basal bolus insulin regimen for a few years.  His blood sugars are usually variably controlled and A1c is usually over 8%  except once was 7.2.  Previously had tried Victoza with some benefit but did not continue this because of perceived side effect of constipation and also out-of-pocket expense  RECENT history:   Insulin regimen: LANTUS insulin 55 units in am  Humalog mix insulin 0-20 units before breakfast, 20-25 supper   He has had inconsistent control of his diabetes since at least late 2015 Although his blood sugars initially improved dramatically after changing Humalog to Humalog mix insulin before each meal in 2/16 he has had inconsistent control  A1c is still about the same at 8%  His blood sugars are showing the following patterns and has the following problems identified:  He gives conflicting answers about how much insulin he is taking and not clear if he is taking the Humalog Mix once a day or twice a day and his wife has different answers on how much he is taking and when  Previously he says he was taking only 20-25 units of Lantus and now he says he is taking 24  Again checking blood sugars mostly before his first meal which is usually around midday  He has occasional very high readings in the afternoons either related to forgetting his insulin or having snacks with high carbohydrate value  No hypoglycemia recently  Despite inconsistent mealtime doses and usually no coverage for his lunch his A1c has not much higher  Still not doing any exercise  Monitors blood glucose: 1-2 times  a day.  Glucometer:  One Touch.  Glucose readings by download through 11/24/14  Mean values apply above for all meters except median  for One Touch  PRE-MEAL Fasting  2-6 PM  Dinner Bedtime Overall  Glucose range:  82-360    127-414   87-254   82-414   Mean/median:  155      167     Dietician visit: Most recent:1/14.  CDE visit 4/15   Wt Readings from Last 3 Encounters:  11/24/14 190 lb 12.8 oz (86.546 kg)  10/11/14 194 lb (87.998 kg)  09/20/14 198 lb 9.6 oz (90.084 kg)   Lab Results  Component Value Date   HGBA1C 8.0* 11/21/2014   HGBA1C 8.2* 07/20/2014   HGBA1C 9.9* 04/20/2014   Lab Results  Component Value Date   MICROALBUR 32.7* 04/20/2014   LDLCALC 59 04/20/2014   CREATININE 1.19 11/21/2014    OTHER active problems addressed today are reviewed in review of systems     Medication List       This list is accurate as of: 11/24/14 11:59 PM.  Always use your most recent med list.               acetaminophen 500 MG tablet  Commonly known as:  TYLENOL  Take per the bottle as needed     aspirin EC 81 MG tablet  1 tablet by mouth every morning     budesonide-formoterol 160-4.5 MCG/ACT inhaler  Commonly known as:  SYMBICORT  Inhale 2 puffs into the lungs 2 (two) times daily.     finasteride 5 MG tablet  Commonly known as:  PROSCAR  Take 5 mg by mouth every morning.     gabapentin 300 MG capsule  Commonly known as:  NEURONTIN  Take 1 capsule (300 mg total) by mouth 4 (four) times daily.     glucose blood test strip  Commonly known as:  ONE TOUCH ULTRA TEST  USE ONE STRIP TO CHECK GLUCOSE THREE TIMES DAILY. DX: E11.65     HUMALOG MIX 75/25 KWIKPEN (75-25) 100 UNIT/ML Kwikpen  Generic drug:  Insulin Lispro Prot & Lispro  INJECT 30 UNITS SUBCUTANEOUSLY BEFORE BREAKFAST, 20 UNITS BEFORE LUNCH AND 30 UNITS BEFORE SUPPER     Hydrocodone-Acetaminophen 5-300 MG Tabs  Take 1 tablet by mouth 4 (four) times daily as needed.     LANTUS SOLOSTAR 100 UNIT/ML Solostar Pen  Generic drug:  Insulin Glargine  INJECT 46 UNITS SUBCUTANEOUSLY ONCE DAILY AT BEDTIME     levothyroxine 112 MCG tablet    Commonly known as:  SYNTHROID, LEVOTHROID  Take 1 tablet (112 mcg total) by mouth daily.     menthol-thymol Liqd  Apply 1 application topically as needed. For pain.     metFORMIN 500 MG 24 hr tablet  Commonly known as:  GLUCOPHAGE-XR  Take 3 tablets (1,500 mg total) by mouth daily with supper.     NASACORT AQ 55 MCG/ACT Aero nasal inhaler  Generic drug:  triamcinolone  Place 2 sprays into the nose daily.     pantoprazole 40 MG tablet  Commonly known as:  PROTONIX  Take 1 tablet (40 mg total) by mouth daily. Take 30-60 min before first meal of the day     polyethylene glycol packet  Commonly known as:  MIRALAX / GLYCOLAX  1 capful daily as needed     rosuvastatin 10 MG tablet  Commonly known as:  CRESTOR  Take 1 tablet (10 mg total) by mouth daily.     TUSSIN DM 10-100 MG/5ML liquid  Generic drug:  dextromethorphan-guaiFENesin  2 tsp every 4 hours as needed for cough     VANTAS [place]  Inject into the skin daily. Pt has an implant of this medication.     venlafaxine XR 37.5 MG 24 hr capsule  Commonly known as:  EFFEXOR XR  Take 1 capsule (37.5 mg total) by mouth daily with breakfast.        Allergies:  Allergies  Allergen Reactions  . Penicillins Swelling    Eyes and lips  . Prednisone Other (See Comments)    Dizziness     Past Medical History  Diagnosis Date  . Heart murmur   . Peripheral vascular disease   . Asthma   . Hypothyroidism   . Anemia     hx of year ago no problems now per pt   . GERD (gastroesophageal reflux disease)   . Arthritis     generalized   . Type 2 diabetes mellitus   . Recovering alcoholic in remission   . Complication of anesthesia     POST OP HYPOTENSION  . History of chronic bronchitis   . History of pulmonary embolus (PE)     2010  . Bladder neck contracture   . History of MI (myocardial infarction)     08/ 2010  secondary acute renal failure/ dehydration--  medical management  . History of Bell's palsy     x2  1998 &   2004 left side --  residual slight left eye droop and vace  . Chronic back pain   .  History of hypertension   . Prostate carcinoma urologist-  dr Gaynelle Arabian    DX 2007  Gleason 6  and recurrent 2012  s/p  cryoablation of prostate both times  . First degree heart block   . RBBB (right bundle branch block)   . History of colon polyps   . Diverticulosis of colon   . Renal calculus, bilateral   . Hypogonadism male   . History of gout   . OSA (obstructive sleep apnea)     severe per study 2007/  non-compliant cpap  . Hypotension   . History of basal cell carcinoma excision     forehead and x6 area forearms  . Atypical chest pain     Past Surgical History  Procedure Laterality Date  . Shoulder open rotator cuff repair  03/20/2011    Procedure: ROTATOR CUFF REPAIR SHOULDER OPEN;  Surgeon: Tobi Bastos;  Location: [facility];  Service: Orthopedics;  Laterality: Right;  . Hiatal hernia repair  1983  . Knee arthroscopy w/ meniscectomy Right 01-25-2003    and chondroplasty  . Inguinal hernia repair Right 10-14-2001  . Prostate cryoablation  09-17-2010  &  04-12-2005  . Repair right arm tendon injury  1975  . Lumbar fusion  [place]  . Excision benign skin lesion of back  10/ 2011  . Cardiovascular stress test  05-19-2012  dr Tressia Miners turner    normal perfusionstudy/  no ischemia/  attenuation artifact inferoapex region of myocardium/  note gate secondary to rhythm irregularity  . Transthoracic echocardiogram  01-11-2013  dr [name]    moderate LVH/  grade I diastolic dysfunction/ ef 29-79%/  mild AV stenosis /  mild MR/  mild LAE  . Ventral hernia repair  1998  . Colonoscopy w/ polypectomy  last one 08-10-2013  . Cataract extraction w/ intraocular lens implant Right 2014  . Tonsillectomy  as child  . Cystoscopy with urethral dilatation N/A 01/14/2014    Procedure: URETHRAL MEATAL DILATATION;  Surgeon: Ailene Rud, MD;  Location: [facility];  Service: Urology;   Laterality: N/A;  . Transurethral resection of bladder tumor N/A 01/14/2014    Procedure: TRANSURETHRAL INCISION OF BLADDER NECK CONTRACTURE;  Surgeon: Ailene Rud, MD;  Location: [facility];  Service: Urology;  Laterality: N/A;    Family History  Problem Relation Age of Onset  . Heart disease Mother   . Stroke Father   . Cancer Brother     unsure of type    Social History:  reports that he has never smoked. He has never used smokeless tobacco. He reports that he does not drink alcohol or use illicit drugs.  Review of Systems   His wife reported that he had memory problems and was referred to neurologist but he canceled this appointment  Blood pressure is controlled without any medications   He has  COPD with chronic cough, treated by the pulmonologist and is taking steroid inhaler with good relief of symptoms   HYPERLIPIDEMIA: Well controlled with Crestor   Lab Results  Component Value Date   CHOL 144 04/20/2014   HDL 58.10 04/20/2014   LDLCALC 59 04/20/2014   TRIG 134.0 04/20/2014   CHOLHDL 2 04/20/2014     He was diagnosed have had sleep apnea but he has not wanted to use CPAP    History of mild hypothyroidism, usually well controlled and his dose was increased in 2015 to 112 g because of high TSH  Lab Results  Component Value Date   TSH 4.31 11/21/2014    He has multiple joint pains and takes oxycodone or Vicodin at times with some relief only He does get some relief with gabapentin and he was advised to take additional doses as needed  Previously had been on Effexor for depression and although he tried this again he did not like the side effects and stopped it.   Refused to take Zoloft     Examination:   BP 112/62 mmHg  Pulse 93  Temp(Src) 98.2 F (36.8 C)  Resp 16  Ht 5\' 11"  (1.803 m)  Wt 190 lb 12.8 oz (86.546 kg)  BMI 26.62 kg/m2  SpO2 98%  Body mass index is 26.62 kg/(m^2).     Assesment/Plan:  Diabetes type 2,  uncontrolled  See history of present illness for detailed discussion of his current management, blood sugar patterns and problems identified He is not compliant with his insulin regimen and most likely is taking arbitrary and intermittent doses of his mealtime/Humalog Mix insulin Because of apparent memory problems he is not able to keep up with his doses and he does not allow his wife to help him adjust the dose and take the insulin on time Also has variable compliance with diet Although his A1c is still reasonably good at 8% he has some readings over 300 periodically Also checking blood sugars mostly in the morning  Advised him to check blood sugars more consistently by rotation at various times before and after meals He will need to take fixed doses of insulin consistently at least twice a day with the Humalog Mix and also extra if he is having additional meal in the afternoon or large snack.  Discussed that he should not adjust to dose based on his blood sugar levels since he is premixed insulin He also agrees to see the nurse educator for further review and support and day-to-day management Advised him to allow his wife to help manage his insulin  Memory issues: Discussed with him that he should be on treatment or at least have evaluation done to prevent deterioration and treatment would be better instituted earlier than later  Hypothyroidism: Adequately replaced . High-dose influenza vaccine given  Patient Instructions  Check blood sugars on waking up .Marland Kitchen4-5  .Marland Kitchen times a week Also check blood sugars about 2 hours after a meal and do this after different meals by rotation  Recommended blood sugar levels on waking up is 90-130 and about 2 hours after meal is 140-180 Please bring blood sugar monitor to each visit.  Lantus 55 in am   Humalog mix 20 units before breakfast, 30 before supper  Counseling time on subjects discussed above is over 50% of today's 25 minute  visit   Lashawn Bromwell 11/25/2014, 9:46 AM

## 2014-11-24 NOTE — Patient Instructions (Signed)
Check blood sugars on waking up .Marland Kitchen4-5  .Marland Kitchen times a week Also check blood sugars about 2 hours after a meal and do this after different meals by rotation  Recommended blood sugar levels on waking up is 90-130 and about 2 hours after meal is 140-180 Please bring blood sugar monitor to each visit.  Lantus 55 in am   Humalog mix 20 units before breakfast, 30 before supper

## 2014-12-07 ENCOUNTER — Telehealth: Payer: Self-pay | Admitting: Internal Medicine

## 2014-12-07 NOTE — Telephone Encounter (Signed)
New message      Wife has questions about husbands health that they should have asked at his last ov.  Husband is there to give permission to nurse when she calls

## 2014-12-07 NOTE — Telephone Encounter (Signed)
I spoke with the patient and he gave a verbal ok to speak with his wife. Per the patient's wife, she had some questions for Dr. Caryl Comes directly and would like to speak with him. I advised her I would give the message to him and see if he can call her at the contact # left: [phone]. She stated Dr. Caryl Comes told her that if the patient had trouble with dizzy spells, that he would probably be able to help the patient with that and she didn't ask him to clarify what this meant. She also had some additional questions, but would not go in to detail with these. I advised her he is out of the office until Friday, but I would leave a message with the nurse covering him to see if she can have him call her back on Friday. She is agreeable and appreciative.

## 2014-12-08 DIAGNOSIS — M1711 Unilateral primary osteoarthritis, right knee: Secondary | ICD-10-CM | POA: Diagnosis not present

## 2014-12-08 NOTE — Telephone Encounter (Signed)
sspoke with ps wife and explained the impact of 1AVB on AV synchrony and the potential consequences of simultaneous AV contraction and the possible benefits of AV pacing to ameliorate  His dizziness she says is not too bad

## 2014-12-17 ENCOUNTER — Other Ambulatory Visit: Payer: Self-pay | Admitting: Endocrinology

## 2014-12-19 ENCOUNTER — Other Ambulatory Visit: Payer: Self-pay | Admitting: Endocrinology

## 2014-12-19 ENCOUNTER — Encounter: Payer: Medicare Other | Attending: Endocrinology | Admitting: Nutrition

## 2014-12-19 ENCOUNTER — Other Ambulatory Visit: Payer: Self-pay | Admitting: *Deleted

## 2014-12-19 ENCOUNTER — Telehealth: Payer: Self-pay | Admitting: Endocrinology

## 2014-12-19 DIAGNOSIS — Z794 Long term (current) use of insulin: Secondary | ICD-10-CM | POA: Diagnosis not present

## 2014-12-19 DIAGNOSIS — Z713 Dietary counseling and surveillance: Secondary | ICD-10-CM | POA: Diagnosis not present

## 2014-12-19 DIAGNOSIS — E1165 Type 2 diabetes mellitus with hyperglycemia: Secondary | ICD-10-CM | POA: Diagnosis not present

## 2014-12-19 DIAGNOSIS — E118 Type 2 diabetes mellitus with unspecified complications: Secondary | ICD-10-CM

## 2014-12-19 MED ORDER — HYDROCODONE-ACETAMINOPHEN 5-300 MG PO TABS: 1.0000 | ORAL_TABLET | Freq: Four times a day (QID) | ORAL | Status: DC | PRN

## 2014-12-19 MED ORDER — INSULIN LISPRO PROT & LISPRO (75-25 MIX) 100 UNIT/ML KWIKPEN: PEN_INJECTOR | SUBCUTANEOUS | Status: DC

## 2014-12-19 NOTE — Telephone Encounter (Signed)
Patient need refill glucose sticks, patient wife stated that pharmacy had no record of it, please advise

## 2014-12-20 NOTE — Progress Notes (Signed)
Patient is here with his wife.  Very difficulty to get from the patient his diet, and insulin doses.  He can not remember very will.  Wife will give information more accurately.  Wife says diet history from patient is not accurate.  He is eating "all the time", and also during the night, when he can not sleep. Typical day per patient: 9-10AM get up.  Takes lantus "50" units (wife says takes 55u)                          Takes Humalog mix: 20u 10-11 AM: Bfast:  2 eggs, 2 toast, 2 pieces of sausage, or cereal, coffee with creamer to drink 1-2-PMM:  Snack of "whatever"  Fruit, PB crackers, or cookies with milk 3-4PM: snack of "a few cookies, or ice cream, or chips or candy.   5-6PM  Supper 4 ounces protein, 1 starchy veg., 1-2 non starchy veg.  Bread, and water to drink 8-10 snack of ice cream (2 scoops), or cookies and milk, or pie   Insulin dose:  55Lantus q AM,   Per Dr. Ronnie Derby order, He will take Humalog mix fr all three meals:  20u acB, 30u acmid afternoon snack, and 20u acS.

## 2014-12-21 NOTE — Patient Instructions (Signed)
Take Humalog Mix: 20u before breakfast, 30u before afternoon snack, and 20u before supper. Take 55u of lantus once a day

## 2015-01-04 ENCOUNTER — Ambulatory Visit (INDEPENDENT_AMBULATORY_CARE_PROVIDER_SITE_OTHER): Payer: Medicare Other | Admitting: Neurology

## 2015-01-04 ENCOUNTER — Encounter: Payer: Self-pay | Admitting: Neurology

## 2015-01-04 ENCOUNTER — Other Ambulatory Visit (INDEPENDENT_AMBULATORY_CARE_PROVIDER_SITE_OTHER): Payer: Medicare Other

## 2015-01-04 VITALS — BP 132/76 | HR 83 | Resp 16 | Wt 195.0 lb

## 2015-01-04 DIAGNOSIS — R413 Other amnesia: Secondary | ICD-10-CM | POA: Diagnosis not present

## 2015-01-04 DIAGNOSIS — C61 Malignant neoplasm of prostate: Secondary | ICD-10-CM

## 2015-01-04 LAB — VITAMIN B12: Vitamin B-12: 184 pg/mL — ABNORMAL LOW (ref 211–911)

## 2015-01-04 LAB — BUN: BUN: 20 mg/dL (ref 6–23)
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
## 2015-01-04 NOTE — Patient Instructions (Signed)
1. Bloodwork for B12 level 2. Schedule MRI brain with and without contrast 3. Physical exercise and brain stimulation exercises, as well as the Mediterranean diet, have been found to be helpful for brain health 4. Follow-up in 6 months

## 2015-01-09 ENCOUNTER — Telehealth: Payer: Self-pay | Admitting: Family Medicine

## 2015-01-09 NOTE — Telephone Encounter (Signed)
No answer

## 2015-01-09 NOTE — Telephone Encounter (Signed)
-----   Message from Cameron Sprang, MD sent at 01/09/2015  1:09 PM EDT ----- Pls let him know his B12 level is low. Would recommend B12 injections, he will need 1000 mcg daily for 1 week, then weekly for 1 month, then monthly for a year. Would it be easier/closer for him to do this with his PCP or would he like [place] to do it? Other option is we teach someone in household to give injections and they do it for him. Thanks

## 2015-01-11 NOTE — Telephone Encounter (Signed)
Called again and spoke with patient's wife and notified of result and advisement for patient to start B12 injection. Patient will just come to our office for injections as his pcp's office is also in our building. Patient will start 1st set of injections on Monday 11/7.

## 2015-01-12 DIAGNOSIS — E1042 Type 1 diabetes mellitus with diabetic polyneuropathy: Secondary | ICD-10-CM | POA: Diagnosis not present

## 2015-01-12 DIAGNOSIS — L603 Nail dystrophy: Secondary | ICD-10-CM | POA: Diagnosis not present

## 2015-01-12 DIAGNOSIS — I739 Peripheral vascular disease, unspecified: Secondary | ICD-10-CM | POA: Diagnosis not present

## 2015-01-12 DIAGNOSIS — L84 Corns and callosities: Secondary | ICD-10-CM | POA: Diagnosis not present

## 2015-01-16 ENCOUNTER — Ambulatory Visit (INDEPENDENT_AMBULATORY_CARE_PROVIDER_SITE_OTHER): Payer: Medicare Other | Admitting: Family Medicine

## 2015-01-16 DIAGNOSIS — E538 Deficiency of other specified B group vitamins: Secondary | ICD-10-CM | POA: Diagnosis not present

## 2015-01-16 MED ORDER — CYANOCOBALAMIN 1000 MCG/ML IJ SOLN
1000.0000 ug | Freq: Once | INTRAMUSCULAR | Status: AC
  Administered 2015-01-16: 1000 ug via INTRAMUSCULAR

## 2015-01-17 ENCOUNTER — Ambulatory Visit (HOSPITAL_COMMUNITY)
Admission: RE | Admit: 2015-01-17 | Discharge: 2015-01-17 | Disposition: A | Discharge status: Home / Self Care | Payer: Medicare Other | Source: Ambulatory Visit | Attending: Neurology | Admitting: Neurology

## 2015-01-17 DIAGNOSIS — R413 Other amnesia: Secondary | ICD-10-CM | POA: Diagnosis present

## 2015-01-17 DIAGNOSIS — G319 Degenerative disease of nervous system, unspecified: Secondary | ICD-10-CM | POA: Diagnosis not present

## 2015-01-17 DIAGNOSIS — C61 Malignant neoplasm of prostate: Secondary | ICD-10-CM | POA: Diagnosis present

## 2015-01-17 DIAGNOSIS — I679 Cerebrovascular disease, unspecified: Secondary | ICD-10-CM | POA: Insufficient documentation

## 2015-01-17 DIAGNOSIS — Z01812 Encounter for preprocedural laboratory examination: Secondary | ICD-10-CM | POA: Insufficient documentation

## 2015-01-17 LAB — CREATININE, SERUM
Creatinine, Ser: 1.38 mg/dL — ABNORMAL HIGH (ref 0.61–1.24)
GFR calc Af Amer: 55 mL/min — ABNORMAL LOW (ref >60)
GFR calc non Af Amer: 47 mL/min — ABNORMAL LOW (ref >60)

## 2015-01-17 MED ORDER — GADOBENATE DIMEGLUMINE 529 MG/ML IV SOLN
18.0000 mL | Freq: Once | INTRAVENOUS | Status: AC | PRN
  Administered 2015-01-17: 18 mL via INTRAVENOUS

## 2015-01-18 ENCOUNTER — Ambulatory Visit (INDEPENDENT_AMBULATORY_CARE_PROVIDER_SITE_OTHER): Payer: Medicare Other | Admitting: Neurology

## 2015-01-18 DIAGNOSIS — E538 Deficiency of other specified B group vitamins: Secondary | ICD-10-CM

## 2015-01-18 MED ORDER — CYANOCOBALAMIN 1000 MCG/ML IJ SOLN
1000.0000 ug | Freq: Once | INTRAMUSCULAR | Status: AC
  Administered 2015-01-18: 1000 ug via INTRAMUSCULAR

## 2015-01-18 NOTE — Progress Notes (Signed)
B12 injection to right deltoid with no apparent complications.   

## 2015-01-19 ENCOUNTER — Ambulatory Visit (INDEPENDENT_AMBULATORY_CARE_PROVIDER_SITE_OTHER): Payer: Medicare Other | Admitting: Family Medicine

## 2015-01-19 ENCOUNTER — Ambulatory Visit: Payer: Medicare Other

## 2015-01-19 ENCOUNTER — Telehealth: Payer: Self-pay | Admitting: Family Medicine

## 2015-01-19 DIAGNOSIS — E538 Deficiency of other specified B group vitamins: Secondary | ICD-10-CM

## 2015-01-19 MED ORDER — CYANOCOBALAMIN 1000 MCG/ML IJ SOLN
1000.0000 ug | Freq: Once | INTRAMUSCULAR | Status: AC
  Administered 2015-01-19: 1000 ug via INTRAMUSCULAR

## 2015-01-19 NOTE — Telephone Encounter (Signed)
No answer. Will try again later. 

## 2015-01-19 NOTE — Telephone Encounter (Signed)
I notified patient of results and advisement while he was here in the office for his Vitamin B 12 Injection.

## 2015-01-19 NOTE — Telephone Encounter (Signed)
-----   Message from Cameron Sprang, MD sent at 01/18/2015  1:07 PM EST ----- Pls let him know I reviewed MRI brain, it is unremarkable, no evidence of tumor, stroke, or bleed. It shows age-related changes. His kidney function test was a little elevated, which can be due to dehydration, increase fluid intake and follow-up with PCP. Thanks

## 2015-01-20 ENCOUNTER — Ambulatory Visit (INDEPENDENT_AMBULATORY_CARE_PROVIDER_SITE_OTHER): Payer: Medicare Other | Admitting: Family Medicine

## 2015-01-20 DIAGNOSIS — E538 Deficiency of other specified B group vitamins: Secondary | ICD-10-CM

## 2015-01-20 MED ORDER — CYANOCOBALAMIN 1000 MCG/ML IJ SOLN
1000.0000 ug | Freq: Once | INTRAMUSCULAR | Status: AC
  Administered 2015-01-20: 1000 ug via INTRAMUSCULAR

## 2015-01-20 NOTE — Progress Notes (Signed)
Vitamin B-12 Injection. Patient tolerated injection well.

## 2015-01-23 ENCOUNTER — Other Ambulatory Visit (INDEPENDENT_AMBULATORY_CARE_PROVIDER_SITE_OTHER): Payer: Medicare Other

## 2015-01-23 ENCOUNTER — Ambulatory Visit (INDEPENDENT_AMBULATORY_CARE_PROVIDER_SITE_OTHER): Payer: Medicare Other | Admitting: Family Medicine

## 2015-01-23 DIAGNOSIS — E1165 Type 2 diabetes mellitus with hyperglycemia: Secondary | ICD-10-CM

## 2015-01-23 DIAGNOSIS — E538 Deficiency of other specified B group vitamins: Secondary | ICD-10-CM

## 2015-01-23 DIAGNOSIS — IMO0002 Reserved for concepts with insufficient information to code with codable children: Secondary | ICD-10-CM

## 2015-01-23 LAB — CBC
HEMATOCRIT: 37.9 % — AB (ref 39.0–52.0)
HEMOGLOBIN: 12.1 g/dL — AB (ref 13.0–17.0)
MCHC: 31.8 g/dL (ref 30.0–36.0)
MCV: 87.2 fl (ref 78.0–100.0)
Platelets: 171 10*3/uL (ref 150.0–400.0)
RBC: 4.34 Mil/uL (ref 4.22–5.81)
RDW: 16.2 % — AB (ref 11.5–15.5)
WBC: 9.1 10*3/uL (ref 4.0–10.5)

## 2015-01-23 LAB — BASIC METABOLIC PANEL
BUN: 21 mg/dL (ref 6–23)
CHLORIDE: 108 meq/L (ref 96–112)
CO2: 25 mEq/L (ref 19–32)
Calcium: 9 mg/dL (ref 8.4–10.5)
Creatinine, Ser: 1.38 mg/dL (ref 0.40–1.50)
GFR: 52.71 mL/min — AB (ref >60.00)
Glucose, Bld: 228 mg/dL — ABNORMAL HIGH (ref 70–99)
Potassium: 5.1 mEq/L (ref 3.5–5.1)
SODIUM: 141 meq/L (ref 135–145)

## 2015-01-23 LAB — LIPID PANEL
CHOLESTEROL: 130 mg/dL (ref 0–200)
HDL: 56.1 mg/dL (ref >39.00)
LDL Cholesterol: 59 mg/dL (ref 0–99)
NonHDL: 74.32
TRIGLYCERIDES: 77 mg/dL (ref 0.0–149.0)
Total CHOL/HDL Ratio: 2
VLDL: 15.4 mg/dL (ref 0.0–40.0)

## 2015-01-23 MED ORDER — CYANOCOBALAMIN 1000 MCG/ML IJ SOLN
1000.0000 ug | Freq: Once | INTRAMUSCULAR | Status: AC
  Administered 2015-01-23: 1000 ug via INTRAMUSCULAR

## 2015-01-23 NOTE — Progress Notes (Signed)
Patient in for a B-12 Injection. Patient tolerated well.

## 2015-01-24 ENCOUNTER — Ambulatory Visit: Payer: Medicare Other

## 2015-01-24 LAB — FRUCTOSAMINE: FRUCTOSAMINE: 265 umol/L (ref 0–285)

## 2015-01-26 ENCOUNTER — Encounter: Payer: Self-pay | Admitting: Endocrinology

## 2015-01-26 ENCOUNTER — Other Ambulatory Visit: Payer: Self-pay | Admitting: *Deleted

## 2015-01-26 ENCOUNTER — Telehealth: Payer: Self-pay | Admitting: Family Medicine

## 2015-01-26 ENCOUNTER — Ambulatory Visit (INDEPENDENT_AMBULATORY_CARE_PROVIDER_SITE_OTHER): Payer: Medicare Other | Admitting: Endocrinology

## 2015-01-26 VITALS — BP 126/66 | HR 89 | Temp 98.3°F | Resp 16 | Ht 71.0 in | Wt 196.2 lb

## 2015-01-26 DIAGNOSIS — Z794 Long term (current) use of insulin: Secondary | ICD-10-CM | POA: Diagnosis not present

## 2015-01-26 DIAGNOSIS — E538 Deficiency of other specified B group vitamins: Secondary | ICD-10-CM | POA: Diagnosis not present

## 2015-01-26 DIAGNOSIS — E1165 Type 2 diabetes mellitus with hyperglycemia: Secondary | ICD-10-CM

## 2015-01-26 DIAGNOSIS — E039 Hypothyroidism, unspecified: Secondary | ICD-10-CM | POA: Diagnosis not present

## 2015-01-26 MED ORDER — HYDROCODONE-ACETAMINOPHEN 5-300 MG PO TABS: 1.0000 | ORAL_TABLET | Freq: Four times a day (QID) | ORAL | Status: DC | PRN

## 2015-01-26 NOTE — Patient Instructions (Signed)
Check blood sugars on waking up 3-4  times a week Also check blood sugars about 2 hours after a meal and do this after different meals by rotation  Recommended blood sugar levels on waking up is 90-130 and about 2 hours after meal is 130-160  Please bring your blood sugar monitor to each visit, thank you  Increase am Humalog mix to 25 units, have only 1 sausage in am  Get B12 1000ug pills 1x daily

## 2015-01-26 NOTE — Telephone Encounter (Signed)
Patient stopped by the office this afternoon after having a f/u visit with Dr. Dwyane Dee. He states Dr. Dwyane Dee told him that he didn't have to get the B-12 injections that he could just vitamin B-12 otc. Patient states that he would rather do that and won't be coming back in for anymore injections.

## 2015-01-26 NOTE — Progress Notes (Signed)
Patient ID: Barry Officer Sr., male   DOB: 1932-08-13, 81 y.o.   MRN: [id number]   Reason for Appointment: follow-up  of various issues   History of Present Illness   Diagnosis: Type 2 diabetes mellitus, date of diagnosis: 1997.   He has been on basal bolus insulin regimen for a few years.  His blood sugars are usually variably controlled and A1c is usually over 8%  except once was 7.2.  Previously had tried Victoza with some benefit but did not continue this because of perceived side effect of constipation and also out-of-pocket expense  RECENT history:   Insulin regimen: LANTUS insulin 40 units in am  Humalog mix insulin 20 units before breakfast, 30 at lunch and 20 at supper   He has had inconsistent control of his diabetes since at least late 2015 Although his blood sugars initially improved  after changing Humalog to Humalog mix insulin before each meal in 2/16 he has had labile blood sugars and persistently high A1c 04 around 8% He was seen by the diabetes educator in 10/16 and he has tried to follow instructions and routine with insulin and diet better However since he did not bring his monitor for download and not clear what his blood sugar patterns are  Fructosamine level of 265 indicates overall better control; lab glucose was 228  His blood sugars are showing the following patterns and has the following problems identified:  He has reduced his dose of Lantus since seeing the nurse educator since morning readings have been lower and subsequently has not had any hypoglycemia  His wife thinks that his blood sugars are higher at lunchtime and he does have some high-fat meats in the morning  Not clear if he is compliant with taking insulin right before each meal  He is probably not checking readings after meals much especially after supper and not clear what these readings are  Still not motivated to exercise, he says he has significant joint pains   Previously has  had significantly high readings with going off his diet and eating sweets and ice cream in the afternoon, does not remember if he has had high readings  No hypoglycemia recently   Monitors blood glucose: 1-2 times  a day.  Glucometer:  One Touch.  Glucose readings  by recall:  PRE-MEAL Fasting Lunch Dinner Bedtime Overall  Glucose range: 89 200 135-200 ?   Mean/median:         Dietician visit: Most recent:1/14.  CDE visit 10/16   Wt Readings from Last 3 Encounters:  01/26/15 196 lb 3.2 oz (88.996 kg)  01/04/15 195 lb (88.451 kg)  11/24/14 190 lb 12.8 oz (86.546 kg)   Lab Results  Component Value Date   HGBA1C 8.0* 11/21/2014   HGBA1C 8.2* 07/20/2014   HGBA1C 9.9* 04/20/2014   Lab Results  Component Value Date   MICROALBUR 32.7* 04/20/2014   LDLCALC 59 01/23/2015   CREATININE 1.38 01/23/2015   Appointment on 01/23/2015  Component Date Value Ref Range Status  . Sodium 01/23/2015 141  135 - 145 mEq/L Final  . Potassium 01/23/2015 5.1  3.5 - 5.1 mEq/L Final  . Chloride 01/23/2015 108  96 - 112 mEq/L Final  . CO2 01/23/2015 25  19 - 32 mEq/L Final  . Glucose, Bld 01/23/2015 228* 70 - 99 mg/dL Final  . BUN 01/23/2015 21  6 - 23 mg/dL Final  . Creatinine, Ser 01/23/2015 1.38  0.40 - 1.50 mg/dL Final  .  Calcium 01/23/2015 9.0  8.4 - 10.5 mg/dL Final  . GFR 01/23/2015 52.71* >60.00 mL/min Final  . Fructosamine 01/23/2015 265  0 - 285 umol/L Final   Comment: Published reference interval for apparently healthy subjects between age 68 and 2 is 49 - 285 umol/L and in a poorly controlled diabetic population is 228 - 563 umol/L with a mean of 396 umol/L.   Marland Kitchen Cholesterol 01/23/2015 130  0 - 200 mg/dL Final   ATP III Classification       Desirable:  < 200 mg/dL               Borderline High:  200 - 239 mg/dL          High:  > = 240 mg/dL  . Triglycerides 01/23/2015 77.0  0.0 - 149.0 mg/dL Final   Normal:  <150 mg/dLBorderline High:  150 - 199 mg/dL  . HDL 01/23/2015 56.10   >39.00 mg/dL Final  . VLDL 01/23/2015 15.4  0.0 - 40.0 mg/dL Final  . LDL Cholesterol 01/23/2015 59  0 - 99 mg/dL Final  . Total CHOL/HDL Ratio 01/23/2015 2   Final                  Men          Women1/2 Average Risk     3.4          3.3Average Risk          5.0          4.42X Average Risk          9.6          7.13X Average Risk          15.0          11.0                      . NonHDL 01/23/2015 74.32   Final   NOTE:  Non-HDL goal should be 30 mg/dL higher than patient's LDL goal (i.e. LDL goal of < 70 mg/dL, would have non-HDL goal of < 100 mg/dL)  . WBC 01/23/2015 9.1  4.0 - 10.5 K/uL Final  . RBC 01/23/2015 4.34  4.22 - 5.81 Mil/uL Final  . Platelets 01/23/2015 171.0  150.0 - 400.0 K/uL Final  . Hemoglobin 01/23/2015 12.1* 13.0 - 17.0 g/dL Final  . HCT 01/23/2015 37.9* 39.0 - 52.0 % Final  . MCV 01/23/2015 87.2  78.0 - 100.0 fl Final  . MCHC 01/23/2015 31.8  30.0 - 36.0 g/dL Final  . RDW 01/23/2015 16.2* 11.5 - 15.5 % Final    OTHER active problems addressed today are reviewed in review of systems     Medication List       This list is accurate as of: 01/26/15 11:59 PM.  Always use your most recent med list.               acetaminophen 500 MG tablet  Commonly known as:  TYLENOL  Take per the bottle as needed     aspirin EC 81 MG tablet  1 tablet by mouth every morning     B-12 1000 MCG/ML Kit  Inject as directed.     budesonide-formoterol 160-4.5 MCG/ACT inhaler  Commonly known as:  SYMBICORT  Inhale 2 puffs into the lungs 2 (two) times daily.     finasteride 5 MG tablet  Commonly known as:  PROSCAR  Take 5 mg by mouth every morning.  gabapentin 300 MG capsule  Commonly known as:  NEURONTIN  Take 1 capsule (300 mg total) by mouth 4 (four) times daily.     Hydrocodone-Acetaminophen 5-300 MG Tabs  Take 1 tablet by mouth 4 (four) times daily as needed.     Insulin Lispro Prot & Lispro (75-25) 100 UNIT/ML Kwikpen  Commonly known as:  HUMALOG MIX 75/25  KWIKPEN  INJECT 20 UNITS IN THE MORNING AND 30 UNITS IN THE EVENING     LANTUS SOLOSTAR 100 UNIT/ML Solostar Pen  Generic drug:  Insulin Glargine  INJECT 46 UNITS SUBCUTANEOUSLY ONCE DAILY AT BEDTIME     levothyroxine 112 MCG tablet  Commonly known as:  SYNTHROID, LEVOTHROID  Take 1 tablet (112 mcg total) by mouth daily.     menthol-thymol Liqd  Apply 1 application topically as needed. For pain.     metFORMIN 500 MG 24 hr tablet  Commonly known as:  GLUCOPHAGE-XR  Take 3 tablets (1,500 mg total) by mouth daily with supper.     NASACORT AQ 55 MCG/ACT Aero nasal inhaler  Generic drug:  triamcinolone  Place 2 sprays into the nose daily.     pantoprazole 40 MG tablet  Commonly known as:  PROTONIX  Take 1 tablet (40 mg total) by mouth daily. Take 30-60 min before first meal of the day     polyethylene glycol packet  Commonly known as:  MIRALAX / GLYCOLAX  1 capful daily as needed     rosuvastatin 10 MG tablet  Commonly known as:  CRESTOR  Take 1 tablet (10 mg total) by mouth daily.     TUSSIN DM 10-100 MG/5ML liquid  Generic drug:  dextromethorphan-guaiFENesin  2 tsp every 4 hours as needed for cough     VANTAS [place]  Inject into the skin daily. Pt has an implant of this medication.        Allergies:  Allergies  Allergen Reactions  . Penicillins Swelling    Eyes and lips  . Prednisone Other (See Comments)    Dizziness     Past Medical History  Diagnosis Date  . Heart murmur   . Peripheral vascular disease ([place])   . Asthma   . Hypothyroidism   . Anemia     hx of year ago no problems now per pt   . GERD (gastroesophageal reflux disease)   . Arthritis     generalized   . Type 2 diabetes mellitus ([place])   . Recovering alcoholic in remission ([place])   . Complication of anesthesia     POST OP HYPOTENSION  . History of chronic bronchitis   . History of pulmonary embolus (PE)     2010  . Bladder neck contracture   . History of MI (myocardial infarction)     08/  2010  secondary acute renal failure/ dehydration--  medical management  . History of Bell's palsy     x2  1998 &  2004 left side --  residual slight left eye droop and vace  . Chronic back pain   . History of hypertension   . Prostate carcinoma [facility]) urologist-  dr Gaynelle Arabian    DX 2007  Gleason 6  and recurrent 2012  s/p  cryoablation of prostate both times  . First degree heart block   . RBBB (right bundle branch block)   . History of colon polyps   . Diverticulosis of colon   . Renal calculus, bilateral   . Hypogonadism male   . History of  gout   . OSA (obstructive sleep apnea)     severe per study 2007/  non-compliant cpap  . Hypotension   . History of basal cell carcinoma excision     forehead and x6 area forearms  . Atypical chest pain     Past Surgical History  Procedure Laterality Date  . Shoulder open rotator cuff repair  03/20/2011    Procedure: ROTATOR CUFF REPAIR SHOULDER OPEN;  Surgeon: Tobi Bastos;  Location: [facility];  Service: Orthopedics;  Laterality: Right;  . Hiatal hernia repair  1983  . Knee arthroscopy w/ meniscectomy Right 01-25-2003    and chondroplasty  . Inguinal hernia repair Right 10-14-2001  . Prostate cryoablation  09-17-2010  &  04-12-2005  . Repair right arm tendon injury  1975  . Lumbar fusion  [place]  . Excision benign skin lesion of back  10/ 2011  . Cardiovascular stress test  05-19-2012  dr Tressia Miners turner    normal perfusionstudy/  no ischemia/  attenuation artifact inferoapex region of myocardium/  note gate secondary to rhythm irregularity  . Transthoracic echocardiogram  01-11-2013  dr [name]    moderate LVH/  grade I diastolic dysfunction/ ef 22-63%/  mild AV stenosis /  mild MR/  mild LAE  . Ventral hernia repair  1998  . Colonoscopy w/ polypectomy  last one 08-10-2013  . Cataract extraction w/ intraocular lens implant Right 2014  . Tonsillectomy  as child  . Cystoscopy with urethral dilatation N/A 01/14/2014    Procedure:  URETHRAL MEATAL DILATATION;  Surgeon: Ailene Rud, MD;  [facility];  Service: Urology;  Laterality: N/A;  . Transurethral resection of bladder tumor N/A 01/14/2014    Procedure: TRANSURETHRAL INCISION OF BLADDER NECK CONTRACTURE;  Surgeon: Ailene Rud, MD;  Location: [facility];  Service: Urology;  Laterality: N/A;  . Vantus implant      Hormones for Prostate    Family History  Problem Relation Age of Onset  . Heart disease Mother   . Stroke Father   . Cancer Brother     unsure of type    Social History:  reports that he has never smoked. He has never used smokeless tobacco. He reports that he does not drink alcohol or use illicit drugs.  Review of Systems  He has been seen by the neurologist for memory disturbance and does appear to have a low B12 184 He has been started on B12 injections His wife thinks that he may be less weak and memory may be a little better since this was started  Blood pressure is controlled without any medications   He has  COPD with chronic cough, treated by the pulmonologist and is taking steroid inhaler with good relief of symptoms   HYPERLIPIDEMIA: Well controlled with Crestor   Lab Results  Component Value Date   CHOL 130 01/23/2015   HDL 56.10 01/23/2015   [place] 59 01/23/2015   TRIG 77.0 01/23/2015   CHOLHDL 2 01/23/2015     He was diagnosed have had sleep apnea but he declines CPAP    History of mild hypothyroidism, usually well controlled    Lab Results  Component Value Date   TSH 4.31 11/21/2014    He has multiple joint pains and takes oxycodone or Vicodin at times  He does get some relief with gabapentin  Previously had been on Effexor for depression and although he tried this again he did not  like the side effects and stopped it.        Examination:   BP 126/66 mmHg  Pulse 89  Temp(Src) 98.3 F (36.8 C)  Resp 16  Ht _0  (1.803 m)  Wt 196 lb 3.2 oz (88.996  kg)  BMI 27.38 kg/m2  SpO2 96%  Body mass index is 27.38 kg/(m^2).   No ankle edema  Assesment/Plan:  Diabetes type 2, uncontrolled  See history of present illness for detailed discussion of his current management, blood sugar patterns and problems identified He is much better compliant with his insulin regimen and probably died after discussion with nurse educator His wife is also helping in his management Fructosamine indicates excellent control now, previous A1c 8%  Since he did not bring his monitor not clear if his blood sugars are consistently controlled Currently taking less basal insulin with Lantus His high readings appear to be mostly after his first meal  For now he will increase his morning Humalog mix by 5 units and also make sure he takes the insulin 5-50 minutes before eating Reduce fat and diet to space keep breakfast Watch snacks in the afternoon Encouragement to be more active  B12 deficiency: Etiology unclear, may possibly be related to metformin although he has taken only short course of this and small doses Discussed with him that the evidence indicates oral B12 replacement is usually adequate and he can try this for now Will check vitamin B12 level in follow-up  HYPERCHOLESTEROLEMIA: His levels are excellent now    Patient Instructions  Check blood sugars on waking up 3-4  times a week Also check blood sugars about 2 hours after a meal and do this after different meals by rotation  Recommended blood sugar levels on waking up is 90-130 and about 2 hours after meal is 130-160  Please bring your blood sugar monitor to each visit, thank you  Increase am Humalog mix to 25 units, have only 1 sausage in am  Get B12 1000ug pills 1x daily          Counseling time on subjects discussed above is over 50% of today's 25 minute visit   Bellamy Judson 01/27/2015, 8:39 AM

## 2015-01-27 ENCOUNTER — Other Ambulatory Visit: Payer: Self-pay | Admitting: *Deleted

## 2015-01-27 MED ORDER — GLUCOSE BLOOD VI STRP: ORAL_STRIP | Status: DC

## 2015-01-27 NOTE — Telephone Encounter (Signed)
Noted, thanks!

## 2015-01-30 NOTE — Progress Notes (Signed)
Quick Note:  Please let patient know that the lab tests are overall good, glucose 228 ______

## 2015-02-09 ENCOUNTER — Encounter: Payer: Self-pay | Admitting: Cardiovascular Disease

## 2015-02-09 NOTE — Progress Notes (Signed)
Patient ID: [name], male   DOB: 19-Nov-1932, 81 y.o.   MRN: [id number]   81 y.o. self referred. Saw Dr Radford Pax at [facility] in March 2013  and did not like the encounter. 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 Long standing history of irregularity in pulse No documented PAF  No palpitations. Reviewed multiple records form [facility]   Event monitor: PAC;s no afib  Stress myovue normal EF 66%  Echo moderte LVH moderate AS mean gradient 16 peak 23 AVA 1.23  He is currently asymptomatic. Gets some dizzyness in mid day BS ok Thinks its related to effexor and neurontin  No chest pain palpitations or dyspnea.   Echo 12/11/12 Study Conclusions  - Left ventricle: The cavity size was normal. Wall thickness was increased in a pattern of moderate LVH. Systolic function was normal. The estimated ejection fraction was in the range of 55% to 65%. Wall motion was normal; there were no regional wall motion abnormalities. Doppler parameters are consistent with abnormal left ventricular relaxation (grade 1 diastolic dysfunction). - Aortic valve: There was mild stenosis. - Mitral valve: Calcified annulus. Mildly thickened leaflets . Mild regurgitation. - Left atrium: The atrium was mildly dilated.  Hormonal Rx for prostate gives him hot flashes  In office today had 4;3 wenkebach.  No presyncope Some fatigue Activity limited by LE arthritis  ETT:  09/14/14  Max HR 117 no ischemia or high grade HB  ROS: Denies fever, malais, weight loss, blurry vision, decreased visual acuity, cough, sputum, SOB, hemoptysis, pleuritic pain, palpitaitons, heartburn, abdominal pain, melena, lower extremity edema, claudication, or rash.  All other systems reviewed and negative  General: Affect appropriate Healthy:  appears stated age 51: normal Neck supple with no adenopathy JVP normal no bruits no thyromegaly Lungs clear with no wheezing and good diaphragmatic motion Heart:  S1/S2 SEM murmur, no rub, gallop or click PMI normal Abdomen:  benighn, BS positve, no tenderness, no AAA no bruit.  No HSM or HJR Distal pulses intact with no bruits No edema Neuro non-focal Skin warm and dry No muscular weakness   Current Outpatient Prescriptions  Medication Sig Dispense Refill  . acetaminophen (TYLENOL) 500 MG tablet Take 500 mg by mouth every 8 (eight) hours as needed for mild pain, moderate pain or headache.     Marland Kitchen aspirin EC 81 MG tablet 1 tablet by mouth every morning    . budesonide-formoterol (SYMBICORT) 160-4.5 MCG/ACT inhaler Inhale 2 puffs into the lungs 2 (two) times daily. 3 Inhaler 3  . dextromethorphan-guaiFENesin (TUSSIN DM) 10-100 MG/5ML liquid Take 10 mLs by mouth every 4 (four) hours as needed for cough.     . finasteride (PROSCAR) 5 MG tablet Take 5 mg by mouth every morning.     . gabapentin (NEURONTIN) 300 MG capsule Take 1 capsule (300 mg total) by mouth 4 (four) times daily. 120 capsule 5  . glucose blood (ONE TOUCH ULTRA TEST) test strip Use as instructed to check blood sugar 3 times per day dx code E11.65 100 each 5  . Histrelin Acetate (VANTAS [place]) Inject into the skin daily. Pt has an implant of this medication.    . Hydrocodone-Acetaminophen 5-300 MG TABS Take 1 tablet by mouth every 4 (four) hours as needed (for pain).    . insulin glargine (LANTUS) 100 UNIT/ML injection Inject 40 Units into the skin daily.    . insulin lispro protamine-lispro (HUMALOG 75/25 MIX) (75-25) 100 UNIT/ML SUSP injection Inject 25 mg subQ in the am, 30 mg subQ in  the afternoon & 20 mg subQ in the pm.    . levothyroxine (SYNTHROID, LEVOTHROID) 112 MCG tablet Take 1 tablet (112 mcg total) by mouth daily. 90 tablet 3  . menthol-thymol (ABSORBINE JR) LIQD Apply 1 application topically as needed. For pain.    . metFORMIN (GLUCOPHAGE) 500 MG tablet Take 1,000 mg by mouth daily with breakfast.    . polyethylene glycol (MIRALAX / GLYCOLAX) packet Take 17 g by mouth daily as needed for mild constipation, moderate constipation or severe  constipation. 1 capful daily as needed    . rosuvastatin (CRESTOR) 10 MG tablet Take 1 tablet (10 mg total) by mouth daily. 90 tablet 3  . triamcinolone (NASACORT AQ) 55 MCG/ACT AERO nasal inhaler Place 2 sprays into the nose daily.    . vitamin B-12 (CYANOCOBALAMIN) 1000 MCG tablet Take 1,000 mcg by mouth daily.     No current facility-administered medications for this visit.    Allergies  Penicillins and Prednisone  Electrocardiogram:  07/15/13   NSR LVH   RBBB LAFB rate 71  07/28/14  SR rate 67 4:3 wenchebach  RBBBB LAFB    Assessment and Plan AV Block:  2:1 block at night with chronic bifasicular block Has seen Dr Caryl Comes   AS:  F/u echo more mild in 2014 sounds moderate with AR   DM:  Brittle:  Some hypoglycemic episodes  F/u Kumar  Chol:   Cholesterol is at goal.  Continue current dose of statin and diet Rx.  No myalgias or side effects.  F/U  LFT's in 6 months. Lab Results  Component Value Date   LDLCALC 59 01/23/2015           F/U with EP 6 months me in 12 months    Jenkins Rouge

## 2015-02-10 ENCOUNTER — Ambulatory Visit (INDEPENDENT_AMBULATORY_CARE_PROVIDER_SITE_OTHER): Payer: Medicare Other | Admitting: Cardiovascular Disease

## 2015-02-10 ENCOUNTER — Encounter: Payer: Self-pay | Admitting: Cardiovascular Disease

## 2015-02-10 VITALS — BP 118/60 | HR 66 | Ht 71.0 in | Wt 196.4 lb

## 2015-02-10 DIAGNOSIS — R011 Cardiac murmur, unspecified: Secondary | ICD-10-CM

## 2015-02-10 DIAGNOSIS — I080 Rheumatic disorders of both mitral and aortic valves: Secondary | ICD-10-CM

## 2015-02-10 NOTE — Patient Instructions (Signed)
Medication Instructions:  Your physician recommends that you continue on your current medications as directed. Please refer to the Current Medication list given to you today.   Labwork: NONE  Testing/Procedures: Your physician has requested that you have an echocardiogram. Echocardiography is a painless test that uses sound waves to create images of your heart. It provides your doctor with information about the size and shape of your heart and how well your heart's chambers and valves are working. This procedure takes approximately one hour. There are no restrictions for this procedure.   Follow-Up: Your physician wants you to follow-up in:  [place] will receive a reminder letter in the mail two months in advance. If you don't receive a letter, please call our office to schedule the follow-up appointment.  Any Other Special Instructions Will Be Listed Below (If Applicable).     If you need a refill on your cardiac medications before your next appointment, please call your pharmacy. \

## 2015-02-14 DIAGNOSIS — L821 Other seborrheic keratosis: Secondary | ICD-10-CM | POA: Diagnosis not present

## 2015-02-14 DIAGNOSIS — D1801 Hemangioma of skin and subcutaneous tissue: Secondary | ICD-10-CM | POA: Diagnosis not present

## 2015-02-14 DIAGNOSIS — D225 Melanocytic nevi of trunk: Secondary | ICD-10-CM | POA: Diagnosis not present

## 2015-02-14 DIAGNOSIS — Z85828 Personal history of other malignant neoplasm of skin: Secondary | ICD-10-CM | POA: Diagnosis not present

## 2015-02-14 DIAGNOSIS — L570 Actinic keratosis: Secondary | ICD-10-CM | POA: Diagnosis not present

## 2015-02-14 DIAGNOSIS — L853 Xerosis cutis: Secondary | ICD-10-CM | POA: Diagnosis not present

## 2015-02-21 ENCOUNTER — Other Ambulatory Visit: Payer: Self-pay

## 2015-02-21 ENCOUNTER — Ambulatory Visit (HOSPITAL_COMMUNITY): Payer: Medicare Other | Attending: Cardiology

## 2015-02-21 DIAGNOSIS — I77810 Thoracic aortic ectasia: Secondary | ICD-10-CM | POA: Diagnosis not present

## 2015-02-21 DIAGNOSIS — I080 Rheumatic disorders of both mitral and aortic valves: Secondary | ICD-10-CM | POA: Diagnosis not present

## 2015-02-21 DIAGNOSIS — E119 Type 2 diabetes mellitus without complications: Secondary | ICD-10-CM | POA: Insufficient documentation

## 2015-02-21 DIAGNOSIS — I10 Essential (primary) hypertension: Secondary | ICD-10-CM | POA: Diagnosis not present

## 2015-02-21 DIAGNOSIS — I350 Nonrheumatic aortic (valve) stenosis: Secondary | ICD-10-CM | POA: Insufficient documentation

## 2015-02-21 DIAGNOSIS — E785 Hyperlipidemia, unspecified: Secondary | ICD-10-CM | POA: Insufficient documentation

## 2015-02-21 DIAGNOSIS — R011 Cardiac murmur, unspecified: Secondary | ICD-10-CM | POA: Insufficient documentation

## 2015-02-21 DIAGNOSIS — I517 Cardiomegaly: Secondary | ICD-10-CM | POA: Diagnosis not present

## 2015-02-21 DIAGNOSIS — I059 Rheumatic mitral valve disease, unspecified: Secondary | ICD-10-CM | POA: Insufficient documentation

## 2015-03-07 ENCOUNTER — Other Ambulatory Visit: Payer: Self-pay

## 2015-03-07 ENCOUNTER — Other Ambulatory Visit (INDEPENDENT_AMBULATORY_CARE_PROVIDER_SITE_OTHER): Payer: Medicare Other

## 2015-03-07 DIAGNOSIS — Z794 Long term (current) use of insulin: Principal | ICD-10-CM

## 2015-03-07 DIAGNOSIS — IMO0001 Reserved for inherently not codable concepts without codable children: Secondary | ICD-10-CM

## 2015-03-07 DIAGNOSIS — E1165 Type 2 diabetes mellitus with hyperglycemia: Secondary | ICD-10-CM

## 2015-03-07 DIAGNOSIS — E538 Deficiency of other specified B group vitamins: Secondary | ICD-10-CM | POA: Diagnosis not present

## 2015-03-07 LAB — BASIC METABOLIC PANEL
BUN: 29 mg/dL — AB (ref 6–23)
CHLORIDE: 104 meq/L (ref 96–112)
CO2: 22 meq/L (ref 19–32)
CREATININE: 1.63 mg/dL — AB (ref 0.40–1.50)
Calcium: 9.1 mg/dL (ref 8.4–10.5)
GFR: 43.48 mL/min — ABNORMAL LOW (ref >60.00)
GLUCOSE: 355 mg/dL — AB (ref 70–99)
POTASSIUM: 4.4 meq/L (ref 3.5–5.1)
Sodium: 137 mEq/L (ref 135–145)

## 2015-03-07 LAB — VITAMIN B12: Vitamin B-12: 567 pg/mL (ref 211–911)

## 2015-03-07 LAB — TSH: TSH: 2.26 u[IU]/mL (ref 0.35–4.50)

## 2015-03-07 LAB — HEMOGLOBIN A1C: HEMOGLOBIN A1C: 8.6 % — AB (ref 4.6–6.5)

## 2015-03-07 LAB — T4, FREE: Free T4: 0.98 ng/dL (ref 0.60–1.60)

## 2015-03-08 LAB — MICROALBUMIN / CREATININE URINE RATIO
Creatinine,U: 184 mg/dL
Microalb Creat Ratio: 1.6 mg/g (ref 0.0–30.0)
Microalb, Ur: 2.9 mg/dL — ABNORMAL HIGH (ref 0.0–1.9)

## 2015-03-09 ENCOUNTER — Ambulatory Visit: Payer: Medicare Other | Admitting: Endocrinology

## 2015-03-10 ENCOUNTER — Encounter: Payer: Self-pay | Admitting: Endocrinology

## 2015-03-10 ENCOUNTER — Ambulatory Visit (INDEPENDENT_AMBULATORY_CARE_PROVIDER_SITE_OTHER): Payer: Medicare Other | Admitting: Endocrinology

## 2015-03-10 ENCOUNTER — Other Ambulatory Visit: Payer: Self-pay | Admitting: *Deleted

## 2015-03-10 VITALS — BP 140/80 | HR 87 | Temp 98.3°F | Resp 14 | Ht 71.0 in | Wt 194.6 lb

## 2015-03-10 DIAGNOSIS — E538 Deficiency of other specified B group vitamins: Secondary | ICD-10-CM

## 2015-03-10 DIAGNOSIS — N289 Disorder of kidney and ureter, unspecified: Secondary | ICD-10-CM | POA: Diagnosis not present

## 2015-03-10 DIAGNOSIS — Z794 Long term (current) use of insulin: Secondary | ICD-10-CM | POA: Diagnosis not present

## 2015-03-10 DIAGNOSIS — E1165 Type 2 diabetes mellitus with hyperglycemia: Secondary | ICD-10-CM | POA: Diagnosis not present

## 2015-03-10 DIAGNOSIS — E039 Hypothyroidism, unspecified: Secondary | ICD-10-CM | POA: Diagnosis not present

## 2015-03-10 MED ORDER — HYDROCODONE-ACETAMINOPHEN 5-300 MG PO TABS: 1.0000 | ORAL_TABLET | ORAL | Status: DC | PRN

## 2015-03-10 NOTE — Patient Instructions (Addendum)
Humalog mix 30 units before Breakfast  Call before refilling Lantus  May need 44 Lantus if am sugar stays >140  Check blood sugars on waking up 3-4  times a week Also check blood sugars about 2 hours after a meal and do this after different meals by rotation  Recommended blood sugar levels on waking up is 90-130 and about 2 hours after meal is 130-160  Please bring your blood sugar monitor to each visit, thank you  Increase fluids  B12, stay on 1000ug daily  Need kidney ultrasound?

## 2015-03-10 NOTE — Progress Notes (Signed)
Patient ID: Barry Officer Sr., male   DOB: 03-19-1932, 81 y.o.   MRN: [id number]   Reason for Appointment: follow-up  of various issues   History of Present Illness   Diagnosis: Type 2 diabetes mellitus, date of diagnosis: 1997.   He has been on basal bolus insulin regimen for a few years.  His blood sugars are usually variably controlled and A1c is usually over 8%  except once was 7.2.  Previously had tried Victoza with some benefit but did not continue this because of perceived side effect of constipation and also out-of-pocket expense  RECENT history:   Insulin regimen: LANTUS insulin 40 units in am  Humalog mix insulin 25 units before breakfast, 30 at lunch and 20 at supper   He has had inconsistent control of his diabetes since at least late 2015 Although his blood sugars initially improved  after changing Humalog to Humalog mix insulin before each meal in 2/16 he has had labile blood sugars and persistently high, now 8.6 He was seen by the diabetes educator in 10/16 but has had only transient improvement in control  His blood sugars are showing the following patterns and has the following problems identified:  He has higher FASTING blood sugars mostly given though he was requiring less Lantus previously  His wife thinks that his poor control is related to inconsistent diet over the holidays especially drinking drinks like eggnog.  He has not had any hypoglycemia even with increasing his Humalog mix insulin to 30 units at lunch  He still is checking his first reading of the day around 12 noon  Blood sugars in the afternoons are sporadically high and the few readings after supper are also mildly increased but difficult to know which readings are post prandial, highest glucose a couple  of times over 400  Monitors blood glucose: 1-2 times  a day.  Glucometer:  One Touch.  Glucose readings    Mean values apply above for all meters except median for One  Touch  PRE-MEAL Fasting Lunch  4-7 PM  Bedtime Overall  Glucose range:  128-277   203-508   155-269    Mean/median: 200     211    Dietician visit: Most recent:1/14.  CDE visit 10/16   Wt Readings from Last 3 Encounters:  03/10/15 194 lb 9.6 oz (88.27 kg)  02/10/15 196 lb 6.4 oz (89.086 kg)  01/26/15 196 lb 3.2 oz (88.996 kg)   Lab Results  Component Value Date   HGBA1C 8.6* 03/07/2015   HGBA1C 8.0* 11/21/2014   HGBA1C 8.2* 07/20/2014   Lab Results  Component Value Date   MICROALBUR 2.9* 03/08/2015   LDLCALC 59 01/23/2015   CREATININE 1.63* 03/07/2015   Lab on 03/07/2015  Component Date Value Ref Range Status  . Hgb A1c MFr Bld 03/07/2015 8.6* 4.6 - 6.5 % Final   Glycemic Control Guidelines for People with Diabetes:Non Diabetic:  <6%Goal of Therapy: <7%Additional Action Suggested:  >8%   . Sodium 03/07/2015 137  135 - 145 mEq/L Final  . Potassium 03/07/2015 4.4  3.5 - 5.1 mEq/L Final  . Chloride 03/07/2015 104  96 - 112 mEq/L Final  . CO2 03/07/2015 22  19 - 32 mEq/L Final  . Glucose, Bld 03/07/2015 355* 70 - 99 mg/dL Final  . BUN 03/07/2015 29* 6 - 23 mg/dL Final  . Creatinine, Ser 03/07/2015 1.63* 0.40 - 1.50 mg/dL Final  . Calcium 03/07/2015 9.1  8.4 - 10.5 mg/dL Final  .  GFR 03/07/2015 43.48* >60.00 mL/min Final  . TSH 03/07/2015 2.26  0.35 - 4.50 uIU/mL Final  . Free T4 03/07/2015 0.98  0.60 - 1.60 ng/dL Final  . Vitamin B-12 03/07/2015 567  211 - 911 pg/mL Final  . Microalb, Ur 03/08/2015 2.9* 0.0 - 1.9 mg/dL Final  . Creatinine,U 03/08/2015 184.0   Final  . Microalb Creat Ratio 03/08/2015 1.6  0.0 - 30.0 mg/g Final    OTHER active problems addressed today are reviewed in review of systems     Medication List       This list is accurate as of: 03/10/15 11:59 PM.  Always use your most recent med list.               acetaminophen 500 MG tablet  Commonly known as:  TYLENOL  Take 500 mg by mouth every 8 (eight) hours as needed for mild pain, moderate  pain or headache.     aspirin EC 81 MG tablet  1 tablet by mouth every morning     budesonide-formoterol 160-4.5 MCG/ACT inhaler  Commonly known as:  SYMBICORT  Inhale 2 puffs into the lungs 2 (two) times daily.     finasteride 5 MG tablet  Commonly known as:  PROSCAR  Take 5 mg by mouth every morning.     gabapentin 300 MG capsule  Commonly known as:  NEURONTIN  Take 1 capsule (300 mg total) by mouth 4 (four) times daily.     glucose blood test strip  Commonly known as:  ONE TOUCH ULTRA TEST  Use as instructed to check blood sugar 3 times per day dx code E11.65     Hydrocodone-Acetaminophen 5-300 MG Tabs  Take 1 tablet by mouth every 4 (four) hours as needed (for pain).     insulin glargine 100 UNIT/ML injection  Commonly known as:  LANTUS  Inject 40 Units into the skin daily.     insulin lispro protamine-lispro (75-25) 100 UNIT/ML Susp injection  Commonly known as:  HUMALOG 75/25 MIX  Inject 25 mg subQ in the am, 30 mg subQ in the afternoon & 20 mg subQ in the pm.     levothyroxine 112 MCG tablet  Commonly known as:  SYNTHROID, LEVOTHROID  Take 1 tablet (112 mcg total) by mouth daily.     menthol-thymol Liqd  Apply 1 application topically as needed. For pain.     metFORMIN 500 MG tablet  Commonly known as:  GLUCOPHAGE  Take 1,000 mg by mouth daily with breakfast.     NASACORT AQ 55 MCG/ACT Aero nasal inhaler  Generic drug:  triamcinolone  Place 2 sprays into the nose daily.     polyethylene glycol packet  Commonly known as:  MIRALAX / GLYCOLAX  Take 17 g by mouth daily as needed for mild constipation, moderate constipation or severe constipation. 1 capful daily as needed     rosuvastatin 10 MG tablet  Commonly known as:  CRESTOR  Take 1 tablet (10 mg total) by mouth daily.     TUSSIN DM 10-100 MG/5ML liquid  Generic drug:  dextromethorphan-guaiFENesin  Take 10 mLs by mouth every 4 (four) hours as needed for cough.     VANTAS [place]  Inject into the skin  daily. Pt has an implant of this medication.     vitamin B-12 1000 MCG tablet  Commonly known as:  CYANOCOBALAMIN  Take 1,000 mcg by mouth daily.        Allergies:  Allergies  Allergen Reactions  .  Penicillins Swelling    Eyes and lips  . Prednisone Other (See Comments)    Dizziness     Past Medical History  Diagnosis Date  . Heart murmur   . Peripheral vascular disease ([place])   . Asthma   . Hypothyroidism   . Anemia     hx of year ago no problems now per pt   . GERD (gastroesophageal reflux disease)   . Arthritis     generalized   . Type 2 diabetes mellitus ([place])   . Recovering alcoholic in remission ([place])   . Complication of anesthesia     POST OP HYPOTENSION  . History of chronic bronchitis   . History of pulmonary embolus (PE)     2010  . Bladder neck contracture   . History of MI (myocardial infarction)     08/ 2010  secondary acute renal failure/ dehydration--  medical management  . History of Bell's palsy     x2  1998 &  2004 left side --  residual slight left eye droop and vace  . Chronic back pain   . History of hypertension   . Prostate carcinoma [facility]) urologist-  dr Gaynelle Arabian    DX 2007  Gleason 6  and recurrent 2012  s/p  cryoablation of prostate both times  . First degree heart block   . RBBB (right bundle branch block)   . History of colon polyps   . Diverticulosis of colon   . Renal calculus, bilateral   . Hypogonadism male   . History of gout   . OSA (obstructive sleep apnea)     severe per study 2007/  non-compliant cpap  . Hypotension   . History of basal cell carcinoma excision     forehead and x6 area forearms  . Atypical chest pain     Past Surgical History  Procedure Laterality Date  . Shoulder open rotator cuff repair  03/20/2011    Procedure: ROTATOR CUFF REPAIR SHOULDER OPEN;  Surgeon: Tobi Bastos;  Location: [facility];  Service: Orthopedics;  Laterality: Right;  . Hiatal hernia repair  1983  . Knee arthroscopy w/ meniscectomy  Right 01-25-2003    and chondroplasty  . Inguinal hernia repair Right 10-14-2001  . Prostate cryoablation  09-17-2010  &  04-12-2005  . Repair right arm tendon injury  1975  . Lumbar fusion  [place]  . Excision benign skin lesion of back  10/ 2011  . Cardiovascular stress test  05-19-2012  dr Tressia Miners turner    normal perfusionstudy/  no ischemia/  attenuation artifact inferoapex region of myocardium/  note gate secondary to rhythm irregularity  . Transthoracic echocardiogram  01-11-2013  dr [name]    moderate LVH/  grade I diastolic dysfunction/ ef [id number]  mild AV stenosis /  mild MR/  mild LAE  . Ventral hernia repair  1998  . Colonoscopy w/ polypectomy  last one 08-10-2013  . Cataract extraction w/ intraocular lens implant Right 2014  . Tonsillectomy  as child  . Cystoscopy with urethral dilatation N/A 01/14/2014    Procedure: URETHRAL MEATAL DILATATION;  Surgeon: Ailene Rud, MD;  Location: [facility];  Service: Urology;  Laterality: N/A;  . Transurethral resection of bladder tumor N/A 01/14/2014    Procedure: TRANSURETHRAL INCISION OF BLADDER NECK CONTRACTURE;  Surgeon: Ailene Rud, MD;  Location[facility];  Service: Urology;  Laterality: N/A;  . Vantus implant  Hormones for Prostate    Family History  Problem Relation Age of Onset  . Heart disease Mother   . Stroke Father   . Cancer Brother     unsure of type    Social History:  reports that he has never smoked. He has never used smokeless tobacco. He reports that he does not drink alcohol or use illicit drugs.  Review of Systems  DYSFUNCTION: This is worse again, not taking any nonsteroidal drugs except Tylenol He thinks his urinary stream is relatively slower, currently still taking Proscar.  Previously had cryotherapy for prostate cancer  Lab Results  Component Value Date   CREATININE 1.63* 03/07/2015   CREATININE 1.38 01/23/2015   CREATININE 1.38* 01/17/2015      He has been seen by the neurologist for memory disturbance and treated for low B12 184 He has been started on B12 tablets 1000 mg daily and his level is significantly improved His wife thinks that he may be less weak and memory may be a little better since this was started  Blood pressure is controlled without any medications, high normal today    He has  COPD with chronic cough, treated by the pulmonologist and is taking steroid inhaler with good relief of symptoms   HYPERLIPIDEMIA: Well controlled with Crestor   Lab Results  Component Value Date   CHOL 130 01/23/2015   HDL 56.10 01/23/2015   [place] 59 01/23/2015   TRIG 77.0 01/23/2015   CHOLHDL 2 01/23/2015     He was diagnosed have had sleep apnea but he declines CPAP    History of mild hypothyroidism which is well controlled  on 112 g    Lab Results  Component Value Date   TSH 2.26 03/07/2015    He has multiple joint pains and takes oxycodone or Vicodin at times  He does get some relief with gabapentin  Previously had been on Effexor for depression, no recurrence      Examination:   BP 140/80 mmHg  Pulse 87  Temp(Src) 98.3 F (36.8 C)  Resp 14  Ht 5\' 11"  (1.803 m)  Wt 194 lb 9.6 oz (88.27 kg)  BMI 27.15 kg/m2  SpO2 97%  Body mass index is 27.15 kg/(m^2).    Assesment/Plan:  Diabetes type 2, uncontrolled  See history of present illness for detailed discussion of his current management, blood sugar patterns and problems identified He is again not very compliant with his diet and most of his higher blood sugars are related to excessive carbohydrate intake including simple sugars are high fat meals and snacks He may have some carryover of hyperglycemia overnight also but probably needs more basal insulin He will increase his Lantus by 4 units if morning sugar does not settle down in the next few days Also needs to increase the morning Humalog mix to 30 units since postprandial readings are higher  after his first meal Recommended some exercise as tolerated  RENAL insufficiency: May be related to some volume depletion as he has had hyperglycemia, not taking any drugs that could cause renal dysfunction, gabapentin dose has been unchanged for quite some time However may consider renal ultrasound when he was discussed with his urologist who will see him in a couple of weeks  B12 deficiency: Etiology unclear, may possibly be related to metformin although he has taken only short course of this and small doses He can continue B12 levels out of the levels are adequate  Joint pain/neuropathy: Controlled currently and new prescription  for Vicodin given today    Patient Instructions  Humalog mix 30 units before Breakfast  Call before refilling Lantus  May need 44 Lantus if am sugar stays >140  Check blood sugars on waking up 3-4  times a week Also check blood sugars about 2 hours after a meal and do this after different meals by rotation  Recommended blood sugar levels on waking up is 90-130 and about 2 hours after meal is 130-160  Please bring your blood sugar monitor to each visit, thank you  Increase fluids  B12, stay on 1000ug daily  Need kidney ultrasound?      Counseling time on subjects discussed above is over 50% of today's 25 minute visit   Quanta Robertshaw 03/13/2015, 3:49 PM

## 2015-03-28 ENCOUNTER — Other Ambulatory Visit: Payer: Self-pay | Admitting: Endocrinology

## 2015-04-03 ENCOUNTER — Other Ambulatory Visit: Payer: Self-pay | Admitting: Endocrinology

## 2015-04-04 ENCOUNTER — Telehealth: Payer: Self-pay | Admitting: Endocrinology

## 2015-04-04 ENCOUNTER — Other Ambulatory Visit: Payer: Self-pay | Admitting: *Deleted

## 2015-04-04 MED ORDER — HYDROCODONE-ACETAMINOPHEN 5-300 MG PO TABS: 1.0000 | ORAL_TABLET | ORAL | Status: DC | PRN

## 2015-04-04 NOTE — Telephone Encounter (Signed)
Patient called stating that Mr. Gail would like a refill on his pain medication    Please advise   Thank you

## 2015-04-04 NOTE — Telephone Encounter (Signed)
Message left for patient that rx is ready to be picked up.

## 2015-04-07 ENCOUNTER — Other Ambulatory Visit: Payer: Self-pay | Admitting: *Deleted

## 2015-04-07 MED ORDER — INSULIN GLARGINE 300 UNIT/ML ~~LOC~~ SOPN: 42.0000 [IU] | PEN_INJECTOR | Freq: Every morning | SUBCUTANEOUS | Status: DC

## 2015-04-07 NOTE — Telephone Encounter (Signed)
Noted, rx sent patient is aware 

## 2015-04-07 NOTE — Telephone Encounter (Signed)
Please call in toujeo for pt call into [facility] at [facility] [facility]

## 2015-04-07 NOTE — Telephone Encounter (Signed)
42 units in a.m. daily

## 2015-04-07 NOTE — Telephone Encounter (Signed)
Please see below and advise on dose of toujeo.  Thanks

## 2015-04-16 ENCOUNTER — Other Ambulatory Visit: Payer: Self-pay | Admitting: Endocrinology

## 2015-04-18 ENCOUNTER — Other Ambulatory Visit (INDEPENDENT_AMBULATORY_CARE_PROVIDER_SITE_OTHER): Payer: Medicare Other

## 2015-04-18 DIAGNOSIS — Z794 Long term (current) use of insulin: Secondary | ICD-10-CM

## 2015-04-18 DIAGNOSIS — E039 Hypothyroidism, unspecified: Secondary | ICD-10-CM | POA: Diagnosis not present

## 2015-04-18 DIAGNOSIS — E1165 Type 2 diabetes mellitus with hyperglycemia: Secondary | ICD-10-CM | POA: Diagnosis not present

## 2015-04-18 LAB — COMPREHENSIVE METABOLIC PANEL
ALK PHOS: 81 U/L (ref 39–117)
ALT: 8 U/L (ref 0–53)
AST: 14 U/L (ref 0–37)
Albumin: 3.7 g/dL (ref 3.5–5.2)
BUN: 20 mg/dL (ref 6–23)
CHLORIDE: 111 meq/L (ref 96–112)
CO2: 28 meq/L (ref 19–32)
Calcium: 9.1 mg/dL (ref 8.4–10.5)
Creatinine, Ser: 1.24 mg/dL (ref 0.40–1.50)
GFR: 59.6 mL/min — AB (ref >60.00)
GLUCOSE: 50 mg/dL — AB (ref 70–99)
POTASSIUM: 4.6 meq/L (ref 3.5–5.1)
SODIUM: 143 meq/L (ref 135–145)
TOTAL PROTEIN: 6.5 g/dL (ref 6.0–8.3)
Total Bilirubin: 0.5 mg/dL (ref 0.2–1.2)

## 2015-04-18 LAB — T4, FREE: Free T4: 1.09 ng/dL (ref 0.60–1.60)

## 2015-04-18 LAB — TSH: TSH: 1.83 u[IU]/mL (ref 0.35–4.50)

## 2015-04-19 LAB — FRUCTOSAMINE: FRUCTOSAMINE: 240 umol/L (ref 0–285)

## 2015-04-21 ENCOUNTER — Ambulatory Visit (INDEPENDENT_AMBULATORY_CARE_PROVIDER_SITE_OTHER): Payer: Medicare Other | Admitting: Endocrinology

## 2015-04-21 ENCOUNTER — Encounter: Payer: Self-pay | Admitting: Endocrinology

## 2015-04-21 VITALS — BP 122/70 | HR 58 | Temp 98.3°F | Resp 20 | Ht 71.0 in | Wt 199.2 lb

## 2015-04-21 DIAGNOSIS — E1165 Type 2 diabetes mellitus with hyperglycemia: Secondary | ICD-10-CM | POA: Diagnosis not present

## 2015-04-21 DIAGNOSIS — D509 Iron deficiency anemia, unspecified: Secondary | ICD-10-CM

## 2015-04-21 DIAGNOSIS — Z794 Long term (current) use of insulin: Secondary | ICD-10-CM

## 2015-04-21 NOTE — Progress Notes (Signed)
Pre visit review using our clinic review tool, if applicable. No additional management support is needed unless otherwise documented below in the visit note. 

## 2015-04-21 NOTE — Progress Notes (Signed)
Patient ID: Barry Officer Sr., male   DOB: May 12, 1932, 82 y.o.   MRN: [id number]   Reason for Appointment: follow-up  of various issues   History of Present Illness   Diagnosis: Type 2 diabetes mellitus, date of diagnosis: 1997.   He has been on basal bolus insulin regimen for a few years.  His blood sugars are usually variably controlled and A1c is usually over 8%  except once was 7.2.  Previously had tried Victoza with some benefit but did not continue this because of perceived side effect of constipation and also out-of-pocket expense  RECENT history:   Insulin regimen: Toujeo insulin 42 units in am  Humalog mix insulin 30 units before breakfast, 30 at 3 pm lunch and 20 at supper   He has had inconsistent control of his diabetes since at least late 2015 Although his blood sugars improved  after changing Humalog to Humalog mix insulin before each meal in 2/16 he has had labile blood sugars He was seen by the diabetes educator for improving self-care also He is now taking Toujeo instead of Lantus, the dose was continued unchanged  His last A1c was 8.6 Fructosamine recently indicates overall fairly good control, now 240  His blood sugars are showing the following patterns and has the following problems identified:  He has significantly better FASTING readings with Toujeo compared to Lantus  He has had hypoglycemia overnight on Monday and low sugar before his first meal today, otherwise no hypoglycemia overnight  Generally his blood sugars are progressively HIGHER as the day goes on and the highest overall 8-10 PM  However not clear if he is checking his blood sugar before eating in the evening or right after finishing eating  He thinks his diet is somewhat better but usually he has difficulty watching his diet with carbohydrates and sweets  He still is checking his first reading of the day around 12 noon  Blood sugars in the afternoons are generally fairly  good now after his fasting readings came down  Not clear why some readings that are all 8-10 PM are over 300 still  On an average his blood sugars are better than his last visit especially in the morning but still has significant variability  He has gained weight  Monitors blood glucose: 1-2 times  a day.  Glucometer:  One Touch.  Glucose readings     Mean values apply above for all meters except median for One Touch  PRE-MEAL Fasting 2-7 PM 8-10 PM Bedtime Overall  Glucose range: 60-265 121-393 175-421    Mean/median: 154  320  178+/-98    Dietician visit: Most recent:1/14.  CDE visit 10/16   Wt Readings from Last 3 Encounters:  04/21/15 199 lb 4 oz (90.379 kg)  03/10/15 194 lb 9.6 oz (88.27 kg)  02/10/15 196 lb 6.4 oz (89.086 kg)   Lab Results  Component Value Date   HGBA1C 8.6* 03/07/2015   HGBA1C 8.0* 11/21/2014   HGBA1C 8.2* 07/20/2014   Lab Results  Component Value Date   MICROALBUR 2.9* 03/08/2015   LDLCALC 59 01/23/2015   CREATININE 1.24 04/18/2015   Lab on 04/18/2015  Component Date Value Ref Range Status  . Sodium 04/18/2015 143  135 - 145 mEq/L Final  . Potassium 04/18/2015 4.6  3.5 - 5.1 mEq/L Final  . Chloride 04/18/2015 111  96 - 112 mEq/L Final  . CO2 04/18/2015 28  19 -  32 mEq/L Final  . Glucose, Bld 04/18/2015 50* 70 - 99 mg/dL Final  . BUN 04/18/2015 20  6 - 23 mg/dL Final  . Creatinine, Ser 04/18/2015 1.24  0.40 - 1.50 mg/dL Final  . Total Bilirubin 04/18/2015 0.5  0.2 - 1.2 mg/dL Final  . Alkaline Phosphatase 04/18/2015 81  39 - 117 U/L Final  . AST 04/18/2015 14  0 - 37 U/L Final  . ALT 04/18/2015 8  0 - 53 U/L Final  . Total Protein 04/18/2015 6.5  6.0 - 8.3 g/dL Final  . Albumin 04/18/2015 3.7  3.5 - 5.2 g/dL Final  . Calcium 04/18/2015 9.1  8.4 - 10.5 mg/dL Final  . GFR 04/18/2015 59.60* >60.00 mL/min Final  . Fructosamine 04/18/2015 240  0 - 285 umol/L Final   Comment: Published reference interval for apparently healthy subjects between  age 6 and 6 is 60 - 285 umol/L and in a poorly controlled diabetic population is 228 - 563 umol/L with a mean of 396 umol/L.   Marland Kitchen TSH 04/18/2015 1.83  0.35 - 4.50 uIU/mL Final  . Free T4 04/18/2015 1.09  0.60 - 1.60 ng/dL Final    OTHER active problems addressed today are reviewed in review of systems     Medication List       This list is accurate as of: 04/21/15 11:59 PM.  Always use your most recent med list.               acetaminophen 500 MG tablet  Commonly known as:  TYLENOL  Take 500 mg by mouth every 8 (eight) hours as needed for mild pain, moderate pain or headache.     aspirin EC 81 MG tablet  1 tablet by mouth every morning     budesonide-formoterol 160-4.5 MCG/ACT inhaler  Commonly known as:  SYMBICORT  Inhale 2 puffs into the lungs 2 (two) times daily.     CRESTOR 10 MG tablet  Generic drug:  rosuvastatin  TAKE ONE TABLET BY MOUTH ONCE DAILY     finasteride 5 MG tablet  Commonly known as:  PROSCAR  Take 5 mg by mouth every morning.     gabapentin 300 MG capsule  Commonly known as:  NEURONTIN  TAKE ONE CAPSULE BY MOUTH 4 TIMES DAILY     glucose blood test strip  Commonly known as:  ONE TOUCH ULTRA TEST  Use as instructed to check blood sugar 3 times per day dx code E11.65     Hydrocodone-Acetaminophen 5-300 MG Tabs  Take 1 tablet by mouth every 4 (four) hours as needed (for pain).     Insulin Glargine 300 UNIT/ML Sopn  Commonly known as:  TOUJEO SOLOSTAR  Inject 42 Units into the skin every morning.     insulin lispro protamine-lispro (75-25) 100 UNIT/ML Susp injection  Commonly known as:  HUMALOG 75/25 MIX  Inject 25 mg subQ in the am, 30 mg subQ in the afternoon & 20 mg subQ in the pm.     levothyroxine 112 MCG tablet  Commonly known as:  SYNTHROID, LEVOTHROID  Take 1 tablet (112 mcg total) by mouth daily.     menthol-thymol Liqd  Apply 1 application topically as needed. For pain.     metFORMIN 500 MG tablet  Commonly known as:   GLUCOPHAGE  Take 1,000 mg by mouth daily with breakfast.     metFORMIN 500 MG 24 hr tablet  Commonly known as:  GLUCOPHAGE-XR  TAKE THREE TABLETS BY MOUTH ONCE DAILY  WITH SUPPER     NASACORT AQ 55 MCG/ACT Aero nasal inhaler  Generic drug:  triamcinolone  Place 2 sprays into the nose daily.     polyethylene glycol packet  Commonly known as:  MIRALAX / GLYCOLAX  Take 17 g by mouth daily as needed for mild constipation, moderate constipation or severe constipation. 1 capful daily as needed     TUSSIN DM 10-100 MG/5ML liquid  Generic drug:  dextromethorphan-guaiFENesin  Take 10 mLs by mouth every 4 (four) hours as needed for cough.     VANTAS [place]  Inject into the skin daily. Pt has an implant of this medication.     vitamin B-12 1000 MCG tablet  Commonly known as:  CYANOCOBALAMIN  Take 1,000 mcg by mouth daily.        Allergies:  Allergies  Allergen Reactions  . Penicillins Swelling    Eyes and lips  . Prednisone Other (See Comments)    Dizziness     Past Medical History  Diagnosis Date  . Heart murmur   . Peripheral vascular disease ([place])   . Asthma   . Hypothyroidism   . Anemia     hx of year ago no problems now per pt   . GERD (gastroesophageal reflux disease)   . Arthritis     generalized   . Type 2 diabetes mellitus ([place])   . Recovering alcoholic in remission ([place])   . Complication of anesthesia     POST OP HYPOTENSION  . History of chronic bronchitis   . History of pulmonary embolus (PE)     2010  . Bladder neck contracture   . History of MI (myocardial infarction)     08/ 2010  secondary acute renal failure/ dehydration--  medical management  . History of Bell's palsy     x2  1998 &  2004 left side --  residual slight left eye droop and vace  . Chronic back pain   . History of hypertension   . Prostate carcinoma [facility]) urologist-  dr Gaynelle Arabian    DX 2007  Gleason 6  and recurrent 2012  s/p  cryoablation of prostate both times  . First degree heart  block   . RBBB (right bundle branch block)   . History of colon polyps   . Diverticulosis of colon   . Renal calculus, bilateral   . Hypogonadism male   . History of gout   . OSA (obstructive sleep apnea)     severe per study 2007/  non-compliant cpap  . Hypotension   . History of basal cell carcinoma excision     forehead and x6 area forearms  . Atypical chest pain     Past Surgical History  Procedure Laterality Date  . Shoulder open rotator cuff repair  03/20/2011    Procedure: ROTATOR CUFF REPAIR SHOULDER OPEN;  Surgeon: Tobi Bastos;  Location: [facility];  Service: Orthopedics;  Laterality: Right;  . Hiatal hernia repair  1983  . Knee arthroscopy w/ meniscectomy Right 01-25-2003    and chondroplasty  . Inguinal hernia repair Right 10-14-2001  . Prostate cryoablation  09-17-2010  &  04-12-2005  . Repair right arm tendon injury  1975  . Lumbar fusion  [place]  . Excision benign skin lesion of back  10/ 2011  . Cardiovascular stress test  05-19-2012  dr Tressia Miners turner    normal perfusionstudy/  no ischemia/  attenuation artifact inferoapex region of myocardium/  note gate  secondary to rhythm irregularity  . Transthoracic echocardiogram  01-11-2013  dr [name]    moderate LVH/  grade I diastolic dysfunction/ ef [id number]  mild AV stenosis /  mild MR/  mild LAE  . Ventral hernia repair  1998  . Colonoscopy w/ polypectomy  last one 08-10-2013  . Cataract extraction w/ intraocular lens implant Right 2014  . Tonsillectomy  as child  . Cystoscopy with urethral dilatation N/A 01/14/2014    Procedure: URETHRAL MEATAL DILATATION;  Surgeon: Ailene Rud, MD;  Location: [facility];  Service: Urology;  Laterality: N/A;  . Transurethral resection of bladder tumor N/A 01/14/2014    Procedure: TRANSURETHRAL INCISION OF BLADDER NECK CONTRACTURE;  Surgeon: Ailene Rud, MD;  Location: [facility];  Service: Urology;  Laterality: N/A;  . Vantus  implant      Hormones for Prostate    Family History  Problem Relation Age of Onset  . Heart disease Mother   . Stroke Father   . Cancer Brother     unsure of type    Social History:  reports that he has never smoked. He has never used smokeless tobacco. He reports that he does not drink alcohol or use illicit drugs.  Review of Systems   RENAL DYSFUNCTION:  His creatinine was 1.6 and he was asked to get an ultrasound from his urologist but has not done so He was having some slow urine stream However his creatinine is gone back to normal now   Lab Results  Component Value Date   CREATININE 1.24 04/18/2015   CREATININE 1.63* 03/07/2015   CREATININE 1.38 01/23/2015     B-12 deficiency:He has been on B12 tablets 1000 mg daily and his level is significantly improved, this may have helped his memory  Blood pressure is controlled without any medications  He has  COPD with chronic cough, treated by the pulmonologist and is taking steroid inhaler with good relief of symptoms   HYPERLIPIDEMIA: Well controlled with Crestor   Lab Results  Component Value Date   CHOL 130 01/23/2015   HDL 56.10 01/23/2015   LDLCALC 59 01/23/2015   TRIG 77.0 01/23/2015   CHOLHDL 2 01/23/2015     He was diagnosed have had sleep apnea but he declines CPAP    History of mild hypothyroidism which is well controlled  on 112 g with recent TSH as follows:   Lab Results  Component Value Date   TSH 1.83 04/18/2015    He has multiple joint pains and takes oxycodone or Vicodin at times,recently has had some back pain after a fall  He does get some relief with gabapentin  No swelling of the feet     Examination:   BP 122/70 mmHg  Pulse 58  Temp(Src) 98.3 F (36.8 C) (Oral)  Resp 20  Ht 5\' 11"  (1.803 m)  Wt 199 lb 4 oz (90.379 kg)  BMI 27.80 kg/m2  SpO2 96%  Body mass index is 27.8 kg/(m^2).    Assesment/Plan:  Diabetes type 2, uncontrolled  See history of present illness for  detailed discussion of his current management, blood sugar patterns and problems identified He is again showing very labile blood sugars His overall control is better with somewhat improving his diet as well as using Toujeo instead of Lantus This week he is starting to get lower readings fasting and has had 1 episode of significant hypoglycemia overnight also  Since his readings are not high after breakfast and in  fact lab glucose was 50 after breakfast will need to reduce his morning premixed insulin Not clear why his readings are sporadically very high late evening  Advised him to check readings either before or 2 hours after eating in the evening rather than right after eating This will enable better assessment of his evening insulin requirement If blood sugars are not improved and he has tendency to unexpected hypoglycemia will switch him to U-500 insulin  Also he will reduce his TOUJEO to avoid overnight hypoglycemia  RENAL insufficiency: improved    Patient Instructions  Insulin regimen: TOUJEO insulin 36 units in am   Humalog mix insulin:24 units before breakfast, 24 at 3 pm lunch and 24 at supper  Check blood sugars on waking up 4-5  times a week Also check blood sugars about 2 hours after a meal and do this after different meals by rotation  Recommended blood sugar levels on waking up is 100-130 and about 2 hours after meal is 130-180  Please bring your blood sugar monitor to each visit, thank you       Counseling time on subjects discussed above is over 50% of today's 25 minute visit   Ray Gervasi 04/23/2015, 7:33 PM

## 2015-04-21 NOTE — Patient Instructions (Addendum)
Insulin regimen: TOUJEO insulin 36 units in am   Humalog mix insulin:24 units before breakfast, 24 at 3 pm lunch and 24 at supper  Check blood sugars on waking up 4-5  times a week Also check blood sugars about 2 hours after a meal and do this after different meals by rotation  Recommended blood sugar levels on waking up is 100-130 and about 2 hours after meal is 130-180  Please bring your blood sugar monitor to each visit, thank you

## 2015-04-25 ENCOUNTER — Other Ambulatory Visit: Payer: Self-pay | Admitting: Endocrinology

## 2015-04-28 DIAGNOSIS — M5136 Other intervertebral disc degeneration, lumbar region: Secondary | ICD-10-CM | POA: Diagnosis not present

## 2015-04-28 DIAGNOSIS — M19032 Primary osteoarthritis, left wrist: Secondary | ICD-10-CM | POA: Diagnosis not present

## 2015-05-03 DIAGNOSIS — Z0000 Encounter for general adult medical examination without abnormal findings: Secondary | ICD-10-CM | POA: Diagnosis not present

## 2015-05-03 DIAGNOSIS — C61 Malignant neoplasm of prostate: Secondary | ICD-10-CM | POA: Diagnosis not present

## 2015-05-03 DIAGNOSIS — N390 Urinary tract infection, site not specified: Secondary | ICD-10-CM | POA: Diagnosis not present

## 2015-05-04 ENCOUNTER — Other Ambulatory Visit: Payer: Self-pay

## 2015-05-04 ENCOUNTER — Telehealth: Payer: Self-pay | Admitting: Endocrinology

## 2015-05-04 MED ORDER — HYDROCODONE-ACETAMINOPHEN 5-300 MG PO TABS: 1.0000 | ORAL_TABLET | ORAL | Status: DC | PRN

## 2015-05-04 NOTE — Telephone Encounter (Signed)
Okay, please print

## 2015-05-04 NOTE — Telephone Encounter (Signed)
Left message notifying patient that Rx is ready for pick up.

## 2015-05-04 NOTE — Telephone Encounter (Signed)
Patient would like a refill on Hydrocodone Acetaminophen 5-300mg  tablets. Last refilled 04/04/15 for #90 with 0 refills. Ok to refill this medication?

## 2015-05-04 NOTE — Telephone Encounter (Signed)
Pt needs refill on pain pills please

## 2015-05-04 NOTE — Telephone Encounter (Signed)
Rx printed - will await Dr. Ronnie Derby signature.

## 2015-05-08 ENCOUNTER — Telehealth: Payer: Self-pay | Admitting: *Deleted

## 2015-05-08 ENCOUNTER — Other Ambulatory Visit: Payer: Self-pay | Admitting: *Deleted

## 2015-05-08 MED ORDER — AZITHROMYCIN 250 MG PO TABS: ORAL_TABLET | ORAL | Status: DC

## 2015-05-08 NOTE — Telephone Encounter (Signed)
Patient has been sick all weekend, they had to call 911 on Saturday he had trouble walking, had chest pain he was given an antibiotic, nitrofurantoin (sp) he is coughing up a lot of the green phlegm.  His wife wants to know if that's the best medication for him?

## 2015-05-08 NOTE — Telephone Encounter (Signed)
rx sent patient is aware

## 2015-05-08 NOTE — Telephone Encounter (Signed)
He needs to be on the Zithromax Dosepak.  Not clear why he is taking nitrofurantoin

## 2015-05-11 ENCOUNTER — Ambulatory Visit: Payer: Medicare Other | Admitting: Endocrinology

## 2015-06-01 ENCOUNTER — Encounter: Payer: Self-pay | Admitting: Endocrinology

## 2015-06-01 ENCOUNTER — Ambulatory Visit (INDEPENDENT_AMBULATORY_CARE_PROVIDER_SITE_OTHER): Payer: Medicare Other | Admitting: Endocrinology

## 2015-06-01 VITALS — BP 108/72 | HR 95 | Temp 97.4°F | Resp 14 | Ht 73.0 in | Wt 189.2 lb

## 2015-06-01 DIAGNOSIS — IMO0001 Reserved for inherently not codable concepts without codable children: Secondary | ICD-10-CM

## 2015-06-01 DIAGNOSIS — Z794 Long term (current) use of insulin: Secondary | ICD-10-CM

## 2015-06-01 DIAGNOSIS — R05 Cough: Secondary | ICD-10-CM | POA: Diagnosis not present

## 2015-06-01 DIAGNOSIS — E1165 Type 2 diabetes mellitus with hyperglycemia: Secondary | ICD-10-CM | POA: Diagnosis not present

## 2015-06-01 DIAGNOSIS — R634 Abnormal weight loss: Secondary | ICD-10-CM

## 2015-06-01 DIAGNOSIS — R059 Cough, unspecified: Secondary | ICD-10-CM

## 2015-06-01 DIAGNOSIS — D509 Iron deficiency anemia, unspecified: Secondary | ICD-10-CM

## 2015-06-01 LAB — BASIC METABOLIC PANEL
BUN: 23 mg/dL (ref 6–23)
CHLORIDE: 107 meq/L (ref 96–112)
CO2: 23 meq/L (ref 19–32)
Calcium: 9.5 mg/dL (ref 8.4–10.5)
Creatinine, Ser: 1.31 mg/dL (ref 0.40–1.50)
GFR: 55.92 mL/min — ABNORMAL LOW (ref >60.00)
GLUCOSE: 133 mg/dL — AB (ref 70–99)
Potassium: 4.7 mEq/L (ref 3.5–5.1)
SODIUM: 141 meq/L (ref 135–145)

## 2015-06-01 LAB — CBC
HEMATOCRIT: 38.4 % — AB (ref 39.0–52.0)
HEMOGLOBIN: 12.3 g/dL — AB (ref 13.0–17.0)
MCHC: 32 g/dL (ref 30.0–36.0)
MCV: 84.1 fl (ref 78.0–100.0)
PLATELETS: 174 10*3/uL (ref 150.0–400.0)
RBC: 4.56 Mil/uL (ref 4.22–5.81)
RDW: 17.8 % — ABNORMAL HIGH (ref 11.5–15.5)
WBC: 11.8 10*3/uL — ABNORMAL HIGH (ref 4.0–10.5)

## 2015-06-01 NOTE — Patient Instructions (Signed)
Mucinex or expectorant

## 2015-06-01 NOTE — Progress Notes (Signed)
Patient ID: Barry Officer Sr., male   DOB: June 07, 1932, 82 y.o.   MRN: [id number]   Reason for Appointment: follow-up  of various issues   History of Present Illness   Diagnosis: Type 2 diabetes mellitus, date of diagnosis: 1997.   He has been on basal bolus insulin regimen for a few years.  His blood sugars are usually variably controlled and A1c is usually over 8%  except once was 7.2.  Previously had tried Victoza with some benefit but did not continue this because of perceived side effect of constipation and also out-of-pocket expense  RECENT history:   Insulin regimen: Toujeo insulin 36 units in am  Humalog mix insulin 24 units  He has had inconsistent control of his diabetes since at least late 2015 Although his blood sugars improved  after changing Humalog to Humalog mix insulin before each meal in 2/16 he has had labile blood sugars He was seen by the diabetes educator for improving self-care also  His last A1c was 8.6    On his last visit his Toujeo was reduced because of tendency to morning /overnight hypoglycemia  Also has a premixed insulin was reduced at breakfast and lunch at suppertime dose increased  For better postprandial control  His blood sugars are showing the following patterns and has the following problems identified:  He has generally fairly good fasting blood sugars with only one low blood sugar  He has not had any other hypoglycemia  His blood sugars in the afternoon's are variable with only one significant hiding  Difficult to determine if his evening blood sugars are before after eating as he does not label them.  Also sometimes checks his blood sugar right after eating and not waiting 2 hours  He says his appetite has been relatively less with respiratory infection and has lost 10 pounds  Portions are relatively smaller and he is also trying to avoid sweets and snacks better in the afternoon  Monitors blood glucose: 1-2 times   a day.  Glucometer:  One Touch.  Glucose readings     Mean values apply above for all meters except median for One Touch  PRE-MEAL Fasting Afternoon  Dinner 7-8 PM  Overall  Glucose range: 58-172 .  88-306  164, 230  112-312    Mean/median: 130    172  153    Dietician visit: Most recent:1/14.  CDE visit 10/16   Exercise: None  Wt Readings from Last 3 Encounters:  06/01/15 189 lb 3.2 oz (85.821 kg)  04/21/15 199 lb 4 oz (90.379 kg)  03/10/15 194 lb 9.6 oz (88.27 kg)   Lab Results  Component Value Date   HGBA1C 8.6* 03/07/2015   HGBA1C 8.0* 11/21/2014   HGBA1C 8.2* 07/20/2014   Lab Results  Component Value Date   MICROALBUR 2.9* 03/08/2015   LDLCALC 59 01/23/2015   CREATININE 1.31 06/01/2015   Office Visit on 06/01/2015  Component Date Value Ref Range Status  . Sodium 06/01/2015 141  135 - 145 mEq/L Final  . Potassium 06/01/2015 4.7  3.5 - 5.1 mEq/L Final  . Chloride 06/01/2015 107  96 - 112 mEq/L Final  . CO2 06/01/2015 23  19 - 32 mEq/L Final  . Glucose, Bld 06/01/2015 133* 70 - 99 mg/dL Final  . BUN 06/01/2015 23  6 - 23 mg/dL Final  . Creatinine, Ser 06/01/2015 1.31  0.40 - 1.50 mg/dL Final  . Calcium 06/01/2015  9.5  8.4 - 10.5 mg/dL Final  . GFR 06/01/2015 55.92* >60.00 mL/min Final  . WBC 06/01/2015 11.8* 4.0 - 10.5 K/uL Final  . RBC 06/01/2015 4.56  4.22 - 5.81 Mil/uL Final  . Platelets 06/01/2015 174.0  150.0 - 400.0 K/uL Final  . Hemoglobin 06/01/2015 12.3* 13.0 - 17.0 g/dL Final  . HCT 06/01/2015 38.4* 39.0 - 52.0 % Final  . MCV 06/01/2015 84.1  78.0 - 100.0 fl Final  . MCHC 06/01/2015 32.0  30.0 - 36.0 g/dL Final  . RDW 06/01/2015 17.8* 11.5 - 15.5 % Final    OTHER active problems addressed today are reviewed in review of systems     Medication List       This list is accurate as of: 06/01/15 11:59 PM.  Always use your most recent med list.               acetaminophen 500 MG tablet  Commonly known as:  TYLENOL  Take 500 mg by mouth every 8  (eight) hours as needed for mild pain, moderate pain or headache.     aspirin EC 81 MG tablet  1 tablet by mouth every morning     azithromycin 250 MG tablet  Commonly known as:  ZITHROMAX Z-PAK  Take 2 tablets the first day, then one tablet daily for the next 4 days.     budesonide-formoterol 160-4.5 MCG/ACT inhaler  Commonly known as:  SYMBICORT  Inhale 2 puffs into the lungs 2 (two) times daily.     CRESTOR 10 MG tablet  Generic drug:  rosuvastatin  TAKE ONE TABLET BY MOUTH ONCE DAILY     finasteride 5 MG tablet  Commonly known as:  PROSCAR  Take 5 mg by mouth every morning.     gabapentin 300 MG capsule  Commonly known as:  NEURONTIN  TAKE ONE CAPSULE BY MOUTH 4 TIMES DAILY     glucose blood test strip  Commonly known as:  ONE TOUCH ULTRA TEST  Use as instructed to check blood sugar 3 times per day dx code E11.65     Insulin Glargine 300 UNIT/ML Sopn  Commonly known as:  TOUJEO SOLOSTAR  Inject 42 Units into the skin every morning.     insulin lispro protamine-lispro (75-25) 100 UNIT/ML Susp injection  Commonly known as:  HUMALOG 75/25 MIX  Inject 24 Units into the skin 3 (three) times daily.     levothyroxine 112 MCG tablet  Commonly known as:  SYNTHROID, LEVOTHROID  TAKE ONE TABLET BY MOUTH ONCE DAILY     menthol-thymol Liqd  Apply 1 application topically as needed. For pain.     metFORMIN 500 MG tablet  Commonly known as:  GLUCOPHAGE  Take 1,000 mg by mouth daily with breakfast.     metFORMIN 500 MG 24 hr tablet  Commonly known as:  GLUCOPHAGE-XR  TAKE THREE TABLETS BY MOUTH ONCE DAILY WITH SUPPER     NASACORT AQ 55 MCG/ACT Aero nasal inhaler  Generic drug:  triamcinolone  Place 2 sprays into the nose daily.     polyethylene glycol packet  Commonly known as:  MIRALAX / GLYCOLAX  Take 17 g by mouth daily as needed for mild constipation, moderate constipation or severe constipation. 1 capful daily as needed     TUSSIN DM 10-100 MG/5ML liquid  Generic  drug:  dextromethorphan-guaiFENesin  Take 10 mLs by mouth every 4 (four) hours as needed for cough.     VANTAS Chrisman  Inject into the skin  daily. Pt has an implant of this medication.     vitamin B-12 1000 MCG tablet  Commonly known as:  CYANOCOBALAMIN  Take 1,000 mcg by mouth daily.        Allergies:  Allergies  Allergen Reactions  . Penicillins Swelling    Eyes and lips  . Prednisone Other (See Comments)    Dizziness     Past Medical History  Diagnosis Date  . Heart murmur   . Peripheral vascular disease ([place])   . Asthma   . Hypothyroidism   . Anemia     hx of year ago no problems now per pt   . GERD (gastroesophageal reflux disease)   . Arthritis     generalized   . Type 2 diabetes mellitus ([place])   . Recovering alcoholic in remission ([place])   . Complication of anesthesia     POST OP HYPOTENSION  . History of chronic bronchitis   . History of pulmonary embolus (PE)     2010  . Bladder neck contracture   . History of MI (myocardial infarction)     08/ 2010  secondary acute renal failure/ dehydration--  medical management  . History of Bell's palsy     x2  1998 &  2004 left side --  residual slight left eye droop and vace  . Chronic back pain   . History of hypertension   . Prostate carcinoma [facility]) urologist-  dr Gaynelle Arabian    DX 2007  Gleason 6  and recurrent 2012  s/p  cryoablation of prostate both times  . First degree heart block   . RBBB (right bundle branch block)   . History of colon polyps   . Diverticulosis of colon   . Renal calculus, bilateral   . Hypogonadism male   . History of gout   . OSA (obstructive sleep apnea)     severe per study 2007/  non-compliant cpap  . Hypotension   . History of basal cell carcinoma excision     forehead and x6 area forearms  . Atypical chest pain     Past Surgical History  Procedure Laterality Date  . Shoulder open rotator cuff repair  03/20/2011    Procedure: ROTATOR CUFF REPAIR SHOULDER OPEN;  Surgeon: Tobi Bastos;  Location: [facility];  Service: Orthopedics;  Laterality: Right;  . Hiatal hernia repair  1983  . Knee arthroscopy w/ meniscectomy Right 01-25-2003    and chondroplasty  . Inguinal hernia repair Right 10-14-2001  . Prostate cryoablation  09-17-2010  &  04-12-2005  . Repair right arm tendon injury  1975  . Lumbar fusion  [place]  . Excision benign skin lesion of back  10/ 2011  . Cardiovascular stress test  05-19-2012  dr Tressia Miners turner    normal perfusionstudy/  no ischemia/  attenuation artifact inferoapex region of myocardium/  note gate secondary to rhythm irregularity  . Transthoracic echocardiogram  01-11-2013  dr [name]    moderate LVH/  grade I diastolic dysfunction/ ef [id number]  mild AV stenosis /  mild MR/  mild LAE  . Ventral hernia repair  1998  . Colonoscopy w/ polypectomy  last one 08-10-2013  . Cataract extraction w/ intraocular lens implant Right 2014  . Tonsillectomy  as child  . Cystoscopy with urethral dilatation N/A 01/14/2014    Procedure: URETHRAL MEATAL DILATATION;  Surgeon: Ailene Rud, MD;  Location: [facility];  Service: Urology;  Laterality: N/A;  .  Transurethral resection of bladder tumor N/A 01/14/2014    Procedure: TRANSURETHRAL INCISION OF BLADDER NECK CONTRACTURE;  Surgeon: Ailene Rud, MD;  Location: [facility];  Service: Urology;  Laterality: N/A;  . Vantus implant      Hormones for Prostate    Family History  Problem Relation Age of Onset  . Heart disease Mother   . Stroke Father   . Cancer Brother     unsure of type    Social History:  reports that he has never smoked. He has never used smokeless tobacco. He reports that he does not drink alcohol or use illicit drugs.  Review of Systems  Cough: He says he had increased cough and expectoration about 2 weeks ago and this is tapering off but still has some cough and rattling and is just although not getting much expectoration, sputum is  clearer now   RENAL DYSFUNCTION:  His creatinine has improved, not clear why it was high in December   Lab Results  Component Value Date   CREATININE 1.31 06/01/2015   CREATININE 1.24 04/18/2015   CREATININE 1.63* 03/07/2015    B-12 deficiency:He has been on B12 tablets 1000 mg daily and his level is significantly improved, this may have helped his memory  He has  COPD with chronic cough, treated by the pulmonologist and is taking steroid inhaler with good relief of symptoms   HYPERLIPIDEMIA: Well controlled with Crestor   Lab Results  Component Value Date   CHOL 130 01/23/2015   HDL 56.10 01/23/2015   [place] 59 01/23/2015   TRIG 77.0 01/23/2015   CHOLHDL 2 01/23/2015     He was diagnosed have had sleep apnea but he declines CPAP    History of mild hypothyroidism which is well controlled  on 112 g with recent TSH as follows:   Lab Results  Component Value Date   TSH 1.83 04/18/2015    He has multiple joint pains and takes oxycodone or Vicodin at times,recently has had some back pain after a fall  He does get some relief with gabapentin     Examination:   BP 108/72 mmHg  Pulse 95  Temp(Src) 97.4 F (36.3 C)  Resp 14  Ht 6\' 1"  (1.854 m)  Wt 189 lb 3.2 oz (85.821 kg)  BMI 24.97 kg/m2  SpO2 96%  Body mass index is 24.97 kg/(m^2).   No lymphadenopathy in the neck He has some scattered rhonchi in his chest, only rare mild wheezing Percussion resonant. No bronchial breathing  Assesment/Plan:  Diabetes type 2, uncontrolled  See history of present illness for detailed discussion of his current management, blood sugar patterns and problems identified His blood sugars are overall much better probably because of his decreased appetite and eating smaller portions and less sweets. With reducing his basal insulin has fasting readings are fairly good and no overnight hypoglycemia Blood sugars are mostly okay after breakfast but difficult to assess after his lunch  and dinner as he checks randomly and sometimes right after eating  He is again showing very labile blood sugars  Not clear why his readings are sporadically very high in the evening  Advised him to check readings either before or 2 hours after eating in the evening rather than right after eating This will enable better assessment of his evening insulin requirement For now will continue the same dose If blood sugars are not improved and he has tendency to unexpected hypoglycemia will switch him to U-500 insulin  COUGH: His lungs sound clear and he needs to have expectorant OTC He will call if he has any fever or purulent sputum  RENAL insufficiency: Consistently better now  History of anemia: Stable, likely related to chronic disease, will also continue B12 supplements  Weight loss: May be related to recent respiratory infection but will reassess on next visit  Patient Instructions  Mucinex or expectorant      Counseling time on subjects discussed above is over 50% of today's 25 minute visit   Anahlia Iseminger 06/03/2015, 5:10 PM

## 2015-06-02 NOTE — Progress Notes (Signed)
Quick Note:  Please let patient know that the white blood cell count is slightly high, if he is still having discolored sputum need to prescribed a Z-Pak ______

## 2015-06-05 ENCOUNTER — Other Ambulatory Visit: Payer: Self-pay | Admitting: *Deleted

## 2015-06-05 LAB — POCT GLYCOSYLATED HEMOGLOBIN (HGB A1C): HEMOGLOBIN A1C: 7.9

## 2015-06-05 MED ORDER — HYDROCODONE-ACETAMINOPHEN 10-325 MG PO TABS: ORAL_TABLET | ORAL | Status: DC

## 2015-06-15 ENCOUNTER — Telehealth: Payer: Self-pay | Admitting: Neurology

## 2015-06-15 NOTE — Telephone Encounter (Signed)
Patient's wife called and cancelled her husband's appointment. He no longer wants to come in. Thank you

## 2015-06-15 NOTE — Telephone Encounter (Signed)
Noted  

## 2015-06-30 ENCOUNTER — Telehealth: Payer: Self-pay | Admitting: Neurology

## 2015-06-30 NOTE — Telephone Encounter (Signed)
Returned call, she states called our office by mistake she was trying to reach Dr. Ronnie Derby office.

## 2015-06-30 NOTE — Telephone Encounter (Signed)
Patient wife Barry Mcdaniel called and states that the patient will need a refill on his hydrocodone  Pt phone number is [phone]

## 2015-07-03 ENCOUNTER — Other Ambulatory Visit: Payer: Self-pay | Admitting: *Deleted

## 2015-07-03 ENCOUNTER — Telehealth: Payer: Self-pay | Admitting: *Deleted

## 2015-07-03 MED ORDER — HYDROCODONE-ACETAMINOPHEN 10-325 MG PO TABS: ORAL_TABLET | ORAL | Status: DC

## 2015-07-03 NOTE — Telephone Encounter (Signed)
Mrs. Barry Mcdaniel came to pick up her husbands prescription today, she had some concerns she wanted you to know about.  Mr. Barry Mcdaniel is only taking his insulin 2 times a day, she said he's not eating very much at all. He refused to see Dr. Delice Lesch, she said he knows something is wrong, but doesn't want to know what it is. He is very confused, they were out for a drive and stopped somewhere, he asked her which way to turn to get home and she told him to turn right, but he said no that was wrong and turned left. He gets very angry to the point where his wife is very frightened of him, she said after he's calmed down, he has no memory of what he did or said. He does not want her to watch him draw up his insulin, and she has no idea if he's actually checked his sugar, he gets angry at her if she stays to watch him.  She is unable to discuss this with you when he's around as he gets very angry at her.

## 2015-07-03 NOTE — Telephone Encounter (Signed)
Recommend starting Effexor XR 75 mg daily if he does not want to see the neurologist or psychiatrist

## 2015-07-04 NOTE — Telephone Encounter (Signed)
Need to see patient in follow-up for further discussion

## 2015-07-04 NOTE — Telephone Encounter (Signed)
Please see Mrs. Fails response below.   Thank you so much for your note. After reading about Effexor, I am very concerned about Barry Mcdaniel taking this medication. He has been bruising so easily recently--even getting bruises from just wearing his wrist brace at night. Also, when his sugar has gone so low on several occasions, he has started seizing. It sounds risky. Would rather talk to Dr. Dwyane Dee further before trying this medication. Your efforts are so appreciated.  Sincerely, Dena

## 2015-07-05 ENCOUNTER — Ambulatory Visit: Payer: Medicare Other | Admitting: Neurology

## 2015-07-06 NOTE — Telephone Encounter (Signed)
Message sent to his wife to schedule.

## 2015-07-12 ENCOUNTER — Ambulatory Visit (INDEPENDENT_AMBULATORY_CARE_PROVIDER_SITE_OTHER): Payer: Medicare Other | Admitting: Endocrinology

## 2015-07-12 DIAGNOSIS — Z794 Long term (current) use of insulin: Secondary | ICD-10-CM | POA: Diagnosis not present

## 2015-07-12 DIAGNOSIS — E1165 Type 2 diabetes mellitus with hyperglycemia: Secondary | ICD-10-CM | POA: Diagnosis not present

## 2015-07-12 DIAGNOSIS — G894 Chronic pain syndrome: Secondary | ICD-10-CM

## 2015-07-12 MED ORDER — QUETIAPINE FUMARATE 25 MG PO TABS: 25.0000 mg | ORAL_TABLET | Freq: Every day | ORAL | Status: DC

## 2015-07-13 NOTE — Progress Notes (Signed)
Subjective:     Patient ID: Barry Officer Sr., male   DOB: [birth year], 82 y.o.   MRN: [id number]  HPI  Chief complaint: Generalized pain and psychological issues  1.  Patient is having multiple joint pains and pains in the legs, not relieved by hydrocodone adequately.  He refuses to go to the pain clinic as suggested.  Also has been taking gabapentin without much relief  2.  Anxiety and anger episodes.  He tends to get anxious and also angry at his wife.  He is also reluctant to have his wife help him with his diabetes management.  She is very concerned that he sometimes does not take his insulin when he is supposed and occasionally when he has low blood sugar episodes he gets confused and wants to take more insulin.  Apparently his blood sugars are fluctuating significantly with some very high readings when he misses his insulin and has extra snacks and occasionally low sugars as before. He is reluctant to take any antidepressant such as Effexor that he had taken before and also refused to see a psychiatrist or even go back to the neurologist  Review of Systems     Objective:   Physical Exam  Exam not indicated     Assessment:      Arthralgias and neuropathic pain.  Offered to increase his pain medications and use Percocet but this apparently did not agree with him.  Also did not want to try Cymbalta which had been at one time used and possibly ineffective; this probably also did not help his depression  Will give him a trial of Seroquel at bedtime especially since he has insomnia, starting and 25 mg and increasing the dose as needed  Diabetes management: Demonstrated the V-go pump as another way of delivering insulin and this may be safer and more reliable.  This will improve compliance with the insulin regimen even though it may not provide as much insulin as with the subcutaneous injections    Plan:     As above To have the insurance form faxed to [place] for insurance  approval Will probably need to have him start a 40 unit V-go pump with boluses of 8-10 units at meals and 4-6 units at snacks.  This will be scheduled with nurse educator as a trial      Anastassia Noack  Addendum: His plan does not cover Seroquel.  We will offer his wife to try and get this without prescription coverage

## 2015-07-18 ENCOUNTER — Telehealth: Payer: Self-pay | Admitting: *Deleted

## 2015-07-18 NOTE — Telephone Encounter (Signed)
Patient has decided not to use the V-go

## 2015-07-21 ENCOUNTER — Other Ambulatory Visit: Payer: Self-pay | Admitting: Endocrinology

## 2015-07-24 ENCOUNTER — Telehealth: Payer: Self-pay | Admitting: Endocrinology

## 2015-07-24 ENCOUNTER — Other Ambulatory Visit: Payer: Self-pay | Admitting: *Deleted

## 2015-07-24 MED ORDER — HYDROCODONE-ACETAMINOPHEN 10-325 MG PO TABS: ORAL_TABLET | ORAL | Status: DC

## 2015-07-24 NOTE — Telephone Encounter (Signed)
Rx is ready, patients wife is aware.

## 2015-07-24 NOTE — Telephone Encounter (Signed)
Patient need a refill of HYDROcodone-acetaminophen (NORCO) 10-325 MG tablet, let wife know when to come pick it up

## 2015-07-31 ENCOUNTER — Other Ambulatory Visit (INDEPENDENT_AMBULATORY_CARE_PROVIDER_SITE_OTHER): Payer: Medicare Other

## 2015-07-31 DIAGNOSIS — E1165 Type 2 diabetes mellitus with hyperglycemia: Secondary | ICD-10-CM | POA: Diagnosis not present

## 2015-07-31 DIAGNOSIS — Z794 Long term (current) use of insulin: Secondary | ICD-10-CM

## 2015-07-31 DIAGNOSIS — IMO0001 Reserved for inherently not codable concepts without codable children: Secondary | ICD-10-CM

## 2015-07-31 LAB — COMPREHENSIVE METABOLIC PANEL
ALK PHOS: 69 U/L (ref 39–117)
ALT: 5 U/L (ref 0–53)
AST: 13 U/L (ref 0–37)
Albumin: 3.8 g/dL (ref 3.5–5.2)
BILIRUBIN TOTAL: 0.4 mg/dL (ref 0.2–1.2)
BUN: 19 mg/dL (ref 6–23)
CO2: 25 meq/L (ref 19–32)
Calcium: 9.1 mg/dL (ref 8.4–10.5)
Chloride: 111 mEq/L (ref 96–112)
Creatinine, Ser: 1.15 mg/dL (ref 0.40–1.50)
GFR: 64.97 mL/min (ref >60.00)
GLUCOSE: 81 mg/dL (ref 70–99)
Potassium: 4.8 mEq/L (ref 3.5–5.1)
SODIUM: 142 meq/L (ref 135–145)
TOTAL PROTEIN: 6.6 g/dL (ref 6.0–8.3)

## 2015-07-31 LAB — LIPID PANEL
CHOL/HDL RATIO: 3
Cholesterol: 118 mg/dL (ref 0–200)
HDL: 40 mg/dL (ref >39.00)
LDL Cholesterol: 53 mg/dL (ref 0–99)
NONHDL: 77.64
Triglycerides: 121 mg/dL (ref 0.0–149.0)
VLDL: 24.2 mg/dL (ref 0.0–40.0)

## 2015-07-31 LAB — TSH: TSH: 3.28 u[IU]/mL (ref 0.35–4.50)

## 2015-07-31 LAB — HEMOGLOBIN A1C: Hgb A1c MFr Bld: 7.1 % — ABNORMAL HIGH (ref 4.6–6.5)

## 2015-08-03 ENCOUNTER — Ambulatory Visit (INDEPENDENT_AMBULATORY_CARE_PROVIDER_SITE_OTHER): Payer: Medicare Other | Admitting: Endocrinology

## 2015-08-03 ENCOUNTER — Encounter: Payer: Self-pay | Admitting: Endocrinology

## 2015-08-03 VITALS — BP 114/70 | HR 71 | Temp 98.2°F | Resp 16 | Ht 73.0 in | Wt 189.2 lb

## 2015-08-03 DIAGNOSIS — E1165 Type 2 diabetes mellitus with hyperglycemia: Secondary | ICD-10-CM | POA: Diagnosis not present

## 2015-08-03 DIAGNOSIS — G894 Chronic pain syndrome: Secondary | ICD-10-CM

## 2015-08-03 DIAGNOSIS — E538 Deficiency of other specified B group vitamins: Secondary | ICD-10-CM | POA: Diagnosis not present

## 2015-08-03 DIAGNOSIS — Z794 Long term (current) use of insulin: Secondary | ICD-10-CM

## 2015-08-03 MED ORDER — OXYCODONE-ACETAMINOPHEN 5-325 MG PO TABS: 1.0000 | ORAL_TABLET | Freq: Three times a day (TID) | ORAL | Status: DC | PRN

## 2015-08-03 NOTE — Progress Notes (Signed)
Patient ID: Barry Officer Sr., male   DOB: [birth year], 82 y.o.   MRN: [id number]   Reason for Appointment: follow-up  of various issues   History of Present Illness   Diagnosis: Type 2 diabetes mellitus, date of diagnosis: 1997.   He has been on basal bolus insulin regimen for a few years.  His blood sugars are usually variably controlled and A1c is usually over 8%  except once was 7.2.  Previously had tried Victoza with some benefit but did not continue this because of perceived side effect of constipation and also out-of-pocket expense  RECENT history:   Insulin regimen: Toujeo insulin 36 units in am  Humalog mix insulin 24 units acb and acs  He has had inconsistent control of his diabetes since at least late 2015 Although his blood sugars improved  after changing Humalog to Humalog mix insulin before each meal in 2/16 he has had labile blood sugars  His A1c is now 7.1 which is the best he has had in years  His blood sugars are showing the following patterns and has the following problems identified:  He has some variability in his morning readings but not consistently high  Although there is no documentation recently he thinks he has occasionally had low sugars at about 2 AM; he has a reading of 165 at 3:30 AM documented  He has had a couple of readings of 67 in the mornings  However he does not have a bedtime snack even with status taking the premixed insulin at suppertime  Blood sugars are being checked sometimes in the afternoon probably after his first meal but he has variable waking up times and these readings are sometimes lower  His blood sugars after supper are quite variable although recently somewhat better  He has maintained his weight although most likely some of his high readings over 200 and related to some snacks and sweets like ice cream  Not able to do much exercise  Continues to take metformin without side effects  Monitors blood  glucose: 1-2 times  a day.  Glucometer:  One Touch.  Glucose readings    Mean values apply above for all meters except median for One Touch  PRE-MEAL Fasting Lunch Dinner Bedtime Overall  Glucose range: 67-220   214, 239  70-330    Mean/median: 137    175  137    Dietician visit: Most recent:1/14.  CDE visit 10/16   Exercise: None  Wt Readings from Last 3 Encounters:  08/03/15 189 lb 3.2 oz (85.821 kg)  06/01/15 189 lb 3.2 oz (85.821 kg)  04/21/15 199 lb 4 oz (90.379 kg)   Lab Results  Component Value Date   HGBA1C 7.1* 07/31/2015   HGBA1C 7.9 06/05/2015   HGBA1C 8.6* 03/07/2015   Lab Results  Component Value Date   MICROALBUR 2.9* 03/08/2015   LDLCALC 53 07/31/2015   CREATININE 1.15 07/31/2015   Other ACTIVE problems are discussed in review of systems    Lab on 07/31/2015  Component Date Value Ref Range Status  . Hgb A1c MFr Bld 07/31/2015 7.1* 4.6 - 6.5 % Final   Glycemic Control Guidelines for People with Diabetes:Non Diabetic:  <6%Goal of Therapy: <7%Additional Action Suggested:  >8%   . Sodium 07/31/2015 142  135 - 145 mEq/L Final  . Potassium 07/31/2015 4.8  3.5 - 5.1 mEq/L Final  . Chloride 07/31/2015 111  96 - 112 mEq/L Final  .  CO2 07/31/2015 25  19 - 32 mEq/L Final  . Glucose, Bld 07/31/2015 81  70 - 99 mg/dL Final  . BUN 07/31/2015 19  6 - 23 mg/dL Final  . Creatinine, Ser 07/31/2015 1.15  0.40 - 1.50 mg/dL Final  . Total Bilirubin 07/31/2015 0.4  0.2 - 1.2 mg/dL Final  . Alkaline Phosphatase 07/31/2015 69  39 - 117 U/L Final  . AST 07/31/2015 13  0 - 37 U/L Final  . ALT 07/31/2015 5  0 - 53 U/L Final  . Total Protein 07/31/2015 6.6  6.0 - 8.3 g/dL Final  . Albumin 07/31/2015 3.8  3.5 - 5.2 g/dL Final  . Calcium 07/31/2015 9.1  8.4 - 10.5 mg/dL Final  . GFR 07/31/2015 64.97  >60.00 mL/min Final  . Cholesterol 07/31/2015 118  0 - 200 mg/dL Final   ATP III Classification       Desirable:  < 200 mg/dL               Borderline High:  200 - 239 mg/dL           High:  > = 240 mg/dL  . Triglycerides 07/31/2015 121.0  0.0 - 149.0 mg/dL Final   Normal:  <150 mg/dLBorderline High:  150 - 199 mg/dL  . HDL 07/31/2015 40.00  >39.00 mg/dL Final  . VLDL 07/31/2015 24.2  0.0 - 40.0 mg/dL Final  . LDL Cholesterol 07/31/2015 53  0 - 99 mg/dL Final  . Total CHOL/HDL Ratio 07/31/2015 3   Final                  Men          Women1/2 Average Risk     3.4          3.3Average Risk          5.0          4.42X Average Risk          9.6          7.13X Average Risk          15.0          11.0                      . NonHDL 07/31/2015 77.64   Final   NOTE:  Non-HDL goal should be 30 mg/dL higher than patient's LDL goal (i.e. LDL goal of < 70 mg/dL, would have non-HDL goal of < 100 mg/dL)  . TSH 07/31/2015 3.28  0.35 - 4.50 uIU/mL Final    OTHER active problems addressed today are reviewed in review of systems     Medication List       This list is accurate as of: 08/03/15 11:59 PM.  Always use your most recent med list.               acetaminophen 500 MG tablet  Commonly known as:  TYLENOL  Take 500 mg by mouth every 8 (eight) hours as needed for mild pain, moderate pain or headache.     aspirin EC 81 MG tablet  1 tablet by mouth every morning     budesonide-formoterol 160-4.5 MCG/ACT inhaler  Commonly known as:  SYMBICORT  Inhale 2 puffs into the lungs 2 (two) times daily.     CRESTOR 10 MG tablet  Generic drug:  rosuvastatin  TAKE ONE TABLET BY MOUTH ONCE DAILY     finasteride 5 MG tablet  Commonly known  as:  PROSCAR  Take 5 mg by mouth every morning.     gabapentin 300 MG capsule  Commonly known as:  NEURONTIN  TAKE ONE CAPSULE BY MOUTH 4 TIMES DAILY     glucose blood test strip  Commonly known as:  ONE TOUCH ULTRA TEST  Use as instructed to check blood sugar 3 times per day dx code E11.65     Insulin Glargine 300 UNIT/ML Sopn  Commonly known as:  TOUJEO SOLOSTAR  Inject 42 Units into the skin every morning.     insulin lispro  protamine-lispro (75-25) 100 UNIT/ML Susp injection  Commonly known as:  HUMALOG 75/25 MIX  Inject 24 Units into the skin 2 (two) times daily with a meal.     levothyroxine 112 MCG tablet  Commonly known as:  SYNTHROID, LEVOTHROID  TAKE ONE TABLET BY MOUTH ONCE DAILY     menthol-thymol Liqd  Apply 1 application topically as needed. For pain.     metFORMIN 500 MG tablet  Commonly known as:  GLUCOPHAGE  Take 1,000 mg by mouth daily with breakfast.     metFORMIN 500 MG 24 hr tablet  Commonly known as:  GLUCOPHAGE-XR  TAKE THREE TABLETS BY MOUTH ONCE DAILY WITH SUPPER     NASACORT AQ 55 MCG/ACT Aero nasal inhaler  Generic drug:  triamcinolone  Place 2 sprays into the nose daily.     oxyCODONE-acetaminophen 5-325 MG tablet  Commonly known as:  ROXICET  Take 1 tablet by mouth every 8 (eight) hours as needed for severe pain.     polyethylene glycol packet  Commonly known as:  MIRALAX / GLYCOLAX  Take 17 g by mouth daily as needed for mild constipation, moderate constipation or severe constipation. 1 capful daily as needed     QUEtiapine 25 MG tablet  Commonly known as:  SEROQUEL  Take 1 tablet (25 mg total) by mouth at bedtime.     TUSSIN DM 10-100 MG/5ML liquid  Generic drug:  dextromethorphan-guaiFENesin  Take 10 mLs by mouth every 4 (four) hours as needed for cough.     VANTAS [place]  Inject into the skin daily. Pt has an implant of this medication.     vitamin B-12 1000 MCG tablet  Commonly known as:  CYANOCOBALAMIN  Take 1,000 mcg by mouth daily.        Allergies:  Allergies  Allergen Reactions  . Penicillins Swelling    Eyes and lips  . Prednisone Other (See Comments)    Dizziness     Past Medical History  Diagnosis Date  . Heart murmur   . Peripheral vascular disease ([place])   . Asthma   . Hypothyroidism   . Anemia     hx of year ago no problems now per pt   . GERD (gastroesophageal reflux disease)   . Arthritis     generalized   . Type 2 diabetes  mellitus ([place])   . Recovering alcoholic in remission ([place])   . Complication of anesthesia     POST OP HYPOTENSION  . History of chronic bronchitis   . History of pulmonary embolus (PE)     2010  . Bladder neck contracture   . History of MI (myocardial infarction)     08/ 2010  secondary acute renal failure/ dehydration--  medical management  . History of Bell's palsy     x2  1998 &  2004 left side --  residual slight left eye droop and vace  . Chronic back  pain   . History of hypertension   . Prostate carcinoma [facility]) urologist-  dr Gaynelle Arabian    DX 2007  Gleason 6  and recurrent 2012  s/p  cryoablation of prostate both times  . First degree heart block   . RBBB (right bundle branch block)   . History of colon polyps   . Diverticulosis of colon   . Renal calculus, bilateral   . Hypogonadism male   . History of gout   . OSA (obstructive sleep apnea)     severe per study 2007/  non-compliant cpap  . Hypotension   . History of basal cell carcinoma excision     forehead and x6 area forearms  . Atypical chest pain     Past Surgical History  Procedure Laterality Date  . Shoulder open rotator cuff repair  03/20/2011    Procedure: ROTATOR CUFF REPAIR SHOULDER OPEN;  Surgeon: Tobi Bastos;  Location: [facility];  Service: Orthopedics;  Laterality: Right;  . Hiatal hernia repair  1983  . Knee arthroscopy w/ meniscectomy Right 01-25-2003    and chondroplasty  . Inguinal hernia repair Right 10-14-2001  . Prostate cryoablation  09-17-2010  &  04-12-2005  . Repair right arm tendon injury  1975  . Lumbar fusion  [place]  . Excision benign skin lesion of back  10/ 2011  . Cardiovascular stress test  05-19-2012  dr Tressia Miners turner    normal perfusionstudy/  no ischemia/  attenuation artifact inferoapex region of myocardium/  note gate secondary to rhythm irregularity  . Transthoracic echocardiogram  01-11-2013  dr [name]    moderate LVH/  grade I diastolic dysfunction/ ef [id number]  mild  AV stenosis /  mild MR/  mild LAE  . Ventral hernia repair  1998  . Colonoscopy w/ polypectomy  last one 08-10-2013  . Cataract extraction w/ intraocular lens implant Right 2014  . Tonsillectomy  as child  . Cystoscopy with urethral dilatation N/A 01/14/2014    Procedure: URETHRAL MEATAL DILATATION;  Surgeon: Ailene Rud, MD;  Location: [facility];  Service: Urology;  Laterality: N/A;  . Transurethral resection of bladder tumor N/A 01/14/2014    Procedure: TRANSURETHRAL INCISION OF BLADDER NECK CONTRACTURE;  Surgeon: Ailene Rud, MD;  [facility];  Service: Urology;  Laterality: N/A;  . Vantus implant      Hormones for Prostate    Family History  Problem Relation Age of Onset  . Heart disease Mother   . Stroke Father   . Cancer Brother     unsure of type    Social History:  reports that he has never smoked. He has never used smokeless tobacco. He reports that he does not drink alcohol or use illicit drugs.  Review of Systems  He has multiple joint pains and takes Vicodin about twice a day usually He thinks this is not helping his pain now but has been reluctant to try other medications.  His wife thinks he may have had some difficulty taking oxycodone previously He refuses to go to a pain clinic He does get some relief with gabapentin   B-12 deficiency:He has been on B12 tablets 1000 mg daily but recently has not refilled this Taking this may have helped his memory  He has  COPD with chronic cough, treated by the pulmonologist and is taking steroid inhaler with good relief of symptoms   HYPERLIPIDEMIA: Well controlled with Crestor   Lab Results  Component Value Date   CHOL 118 07/31/2015   HDL 40.00 07/31/2015   LDLCALC 53 07/31/2015   TRIG 121.0 07/31/2015   CHOLHDL 3 07/31/2015     He was diagnosed have had sleep apnea but he declines CPAP    Does not sleep well execpt in am around 8-11 AM  Also complaining  of frequent nocturia but has not discussed this with his urologist  History of mild hypothyroidism which is well controlled  on 112 g with recent TSH as follows:   Lab Results  Component Value Date   TSH 3.28 07/31/2015         Examination:   BP 114/70 mmHg  Pulse 71  Temp(Src) 98.2 F (36.8 C)  Resp 16  Ht 6\' 1"  (1.854 m)  Wt 189 lb 3.2 oz (85.821 kg)  BMI 24.97 kg/m2  SpO2 96%  Body mass index is 24.97 kg/(m^2).      Assesment/Plan:  Diabetes type 2, uncontrolled  See history of present illness for detailed discussion of his current management, blood sugar patterns and problems identified His blood sugars are overall much better with A1c now 7.1 He has maintained his weight Is eating fairly well and his wife is helping him with his diet although he still having consistently high readings in the afternoons and evenings based on his portions and snacks as well as sweets He and his wife are concerned about tendency to overnight hypoglycemia at times even with reducing his insulin  For now he will reduce his suppertime dose by 4 units and have a bedtime snack Also consider reducing morning premixed insulin Continue metformin Call if having excessive hyperglycemia  PAIN management: He has multiple joint pains and neuropathy with inadequate relief taking gabapentin and Vicodin Discussed that since she has used Vicodin without overusing it for the last 2 years would like to have him try oxycodone twice a day as needed unless he agrees to go to a pain clinic which he refuses  Insomnia and occasional behavioral issues: His wife would like him to try Seroquel and this will be authorized.  Discussed other antidepressants which he has not been benefiting from and other atypical antipsychotics may cause more side effects and weight gain  History of anemia: Stable, likely related to chronic disease, will have him resume his B12    Patient Instructions  Supper dose 20 instead  of 24  Cut back on caffeine   Bedtime protein snack with protein   Check blood sugars on waking up   times a week Also check blood sugars about 2 hours after a meal and do this after different meals by rotation  Recommended blood sugar levels on waking up is 90-130 and about 2 hours after meal is 130-160  Please bring your blood sugar monitor to each visit, thank you       Counseling time on subjects discussed above is over 50% of today's 25 minute visit   Antwanette Wesche 08/04/2015, 9:49 AM

## 2015-08-03 NOTE — Patient Instructions (Addendum)
Supper dose 20 instead of 24  Cut back on caffeine   Bedtime protein snack with protein   Check blood sugars on waking up   times a week Also check blood sugars about 2 hours after a meal and do this after different meals by rotation  Recommended blood sugar levels on waking up is 90-130 and about 2 hours after meal is 130-160  Please bring your blood sugar monitor to each visit, thank you

## 2015-08-14 ENCOUNTER — Other Ambulatory Visit: Payer: Self-pay | Admitting: Endocrinology

## 2015-08-14 ENCOUNTER — Ambulatory Visit: Payer: Medicare Other | Admitting: Internal Medicine

## 2015-09-04 ENCOUNTER — Telehealth: Payer: Self-pay | Admitting: Endocrinology

## 2015-09-04 ENCOUNTER — Other Ambulatory Visit: Payer: Self-pay

## 2015-09-04 MED ORDER — OXYCODONE-ACETAMINOPHEN 5-325 MG PO TABS: 1.0000 | ORAL_TABLET | Freq: Three times a day (TID) | ORAL | Status: DC | PRN

## 2015-09-04 NOTE — Telephone Encounter (Signed)
Patient need a refill of oxycodone.

## 2015-09-04 NOTE — Telephone Encounter (Signed)
Okay to refill? 

## 2015-09-04 NOTE — Telephone Encounter (Signed)
Medication refill ordered, per MD.

## 2015-09-04 NOTE — Telephone Encounter (Signed)
Rx Printed waiting on MD to sign.

## 2015-09-04 NOTE — Telephone Encounter (Signed)
Rx signed and placed up front for pt to pick up.

## 2015-09-18 ENCOUNTER — Other Ambulatory Visit: Payer: Self-pay

## 2015-09-18 MED ORDER — GLUCOSE BLOOD VI STRP: ORAL_STRIP | Status: DC

## 2015-09-29 ENCOUNTER — Other Ambulatory Visit: Payer: Self-pay | Admitting: Endocrinology

## 2015-09-29 NOTE — Telephone Encounter (Signed)
PT needs his Oxycodone prescription refilled.

## 2015-09-29 NOTE — Telephone Encounter (Signed)
Last filled 09/04/15--please advise

## 2015-09-30 ENCOUNTER — Other Ambulatory Visit: Payer: Self-pay | Admitting: Endocrinology

## 2015-10-02 MED ORDER — OXYCODONE-ACETAMINOPHEN 5-325 MG PO TABS: 1.0000 | ORAL_TABLET | Freq: Three times a day (TID) | ORAL | 0 refills | Status: DC | PRN

## 2015-10-04 NOTE — Telephone Encounter (Signed)
humalog mix needs to be called into [facility] please

## 2015-10-11 DIAGNOSIS — E119 Type 2 diabetes mellitus without complications: Secondary | ICD-10-CM | POA: Diagnosis not present

## 2015-10-11 DIAGNOSIS — H02204 Unspecified lagophthalmos left upper eyelid: Secondary | ICD-10-CM | POA: Diagnosis not present

## 2015-10-11 DIAGNOSIS — H04123 Dry eye syndrome of bilateral lacrimal glands: Secondary | ICD-10-CM | POA: Diagnosis not present

## 2015-10-11 DIAGNOSIS — H524 Presbyopia: Secondary | ICD-10-CM | POA: Diagnosis not present

## 2015-10-11 DIAGNOSIS — H02202 Unspecified lagophthalmos right lower eyelid: Secondary | ICD-10-CM | POA: Diagnosis not present

## 2015-10-11 DIAGNOSIS — H02201 Unspecified lagophthalmos right upper eyelid: Secondary | ICD-10-CM | POA: Diagnosis not present

## 2015-10-11 DIAGNOSIS — H02205 Unspecified lagophthalmos left lower eyelid: Secondary | ICD-10-CM | POA: Diagnosis not present

## 2015-10-11 DIAGNOSIS — Z961 Presence of intraocular lens: Secondary | ICD-10-CM | POA: Diagnosis not present

## 2015-10-11 DIAGNOSIS — H26493 Other secondary cataract, bilateral: Secondary | ICD-10-CM | POA: Diagnosis not present

## 2015-10-11 LAB — HM DIABETES EYE EXAM

## 2015-10-23 ENCOUNTER — Telehealth: Payer: Self-pay | Admitting: Endocrinology

## 2015-10-23 MED ORDER — OXYCODONE-ACETAMINOPHEN 5-325 MG PO TABS: 1.0000 | ORAL_TABLET | Freq: Three times a day (TID) | ORAL | 0 refills | Status: DC | PRN

## 2015-10-23 NOTE — Telephone Encounter (Signed)
Rx printed and placed on MD's desk to sign. Pt advised he can pick his rx up tomorrow. Pt voiced understanding.

## 2015-10-23 NOTE — Telephone Encounter (Signed)
PT wife called and requested his Oxycodone prescription.

## 2015-10-23 NOTE — Telephone Encounter (Signed)
See below . Thanks

## 2015-10-23 NOTE — Telephone Encounter (Signed)
ok 

## 2015-10-30 ENCOUNTER — Other Ambulatory Visit: Payer: Self-pay | Admitting: Endocrinology

## 2015-10-31 ENCOUNTER — Other Ambulatory Visit (INDEPENDENT_AMBULATORY_CARE_PROVIDER_SITE_OTHER): Payer: Medicare Other

## 2015-10-31 DIAGNOSIS — E1165 Type 2 diabetes mellitus with hyperglycemia: Secondary | ICD-10-CM | POA: Diagnosis not present

## 2015-10-31 DIAGNOSIS — E538 Deficiency of other specified B group vitamins: Secondary | ICD-10-CM | POA: Diagnosis not present

## 2015-10-31 DIAGNOSIS — Z794 Long term (current) use of insulin: Secondary | ICD-10-CM

## 2015-10-31 LAB — LIPID PANEL
CHOLESTEROL: 117 mg/dL (ref 0–200)
HDL: 44.6 mg/dL (ref >39.00)
LDL CALC: 53 mg/dL (ref 0–99)
NONHDL: 72.82
Total CHOL/HDL Ratio: 3
Triglycerides: 98 mg/dL (ref 0.0–149.0)
VLDL: 19.6 mg/dL (ref 0.0–40.0)

## 2015-10-31 LAB — HEMOGLOBIN A1C: Hgb A1c MFr Bld: 7.4 % — ABNORMAL HIGH (ref 4.6–6.5)

## 2015-10-31 LAB — COMPREHENSIVE METABOLIC PANEL
ALBUMIN: 3.8 g/dL (ref 3.5–5.2)
ALT: 6 U/L (ref 0–53)
AST: 11 U/L (ref 0–37)
Alkaline Phosphatase: 80 U/L (ref 39–117)
BUN: 21 mg/dL (ref 6–23)
CHLORIDE: 106 meq/L (ref 96–112)
CO2: 27 mEq/L (ref 19–32)
CREATININE: 1.27 mg/dL (ref 0.40–1.50)
Calcium: 9 mg/dL (ref 8.4–10.5)
GFR: 57.9 mL/min — ABNORMAL LOW (ref >60.00)
Glucose, Bld: 107 mg/dL — ABNORMAL HIGH (ref 70–99)
Potassium: 4.9 mEq/L (ref 3.5–5.1)
SODIUM: 140 meq/L (ref 135–145)
TOTAL PROTEIN: 6.8 g/dL (ref 6.0–8.3)
Total Bilirubin: 0.3 mg/dL (ref 0.2–1.2)

## 2015-10-31 LAB — CBC
HEMATOCRIT: 38.2 % — AB (ref 39.0–52.0)
Hemoglobin: 12.3 g/dL — ABNORMAL LOW (ref 13.0–17.0)
MCHC: 32.2 g/dL (ref 30.0–36.0)
MCV: 86.6 fl (ref 78.0–100.0)
Platelets: 168 10*3/uL (ref 150.0–400.0)
RBC: 4.41 Mil/uL (ref 4.22–5.81)
RDW: 15.7 % — ABNORMAL HIGH (ref 11.5–15.5)
WBC: 10.2 10*3/uL (ref 4.0–10.5)

## 2015-11-03 ENCOUNTER — Ambulatory Visit (INDEPENDENT_AMBULATORY_CARE_PROVIDER_SITE_OTHER): Payer: Medicare Other | Admitting: Endocrinology

## 2015-11-03 ENCOUNTER — Encounter: Payer: Self-pay | Admitting: Endocrinology

## 2015-11-03 VITALS — BP 130/80 | HR 70 | Ht 73.0 in | Wt 189.0 lb

## 2015-11-03 DIAGNOSIS — E538 Deficiency of other specified B group vitamins: Secondary | ICD-10-CM

## 2015-11-03 DIAGNOSIS — E1165 Type 2 diabetes mellitus with hyperglycemia: Secondary | ICD-10-CM

## 2015-11-03 DIAGNOSIS — E039 Hypothyroidism, unspecified: Secondary | ICD-10-CM | POA: Diagnosis not present

## 2015-11-03 DIAGNOSIS — Z794 Long term (current) use of insulin: Secondary | ICD-10-CM

## 2015-11-03 DIAGNOSIS — G894 Chronic pain syndrome: Secondary | ICD-10-CM | POA: Diagnosis not present

## 2015-11-03 NOTE — Progress Notes (Signed)
Patient ID: Barry Officer Sr., male   DOB: 1932-05-31, 82 y.o.   MRN: [id number]   Reason for Appointment: follow-up  of various issues   History of Present Illness   Diagnosis: Type 2 diabetes mellitus, date of diagnosis: 1997.   He has been on basal bolus insulin regimen for a few years.  His blood sugars are usually variably controlled and A1c is usually over 8%  except once was 7.2.  Previously had tried Victoza with some benefit but did not continue this because of perceived side effect of constipation and also out-of-pocket expense  RECENT history:   Insulin regimen: Toujeo insulin 36 units in am  Humalog mix insulin 24 units acb and 20acs  He has had inconsistent control of his diabetes since at least late 2015 Although his blood sugars improved  after changing Humalog to Humalog mix insulin before each meal in 2/16 he has had labile blood sugars  His A1c is now 7.4 but his home blood sugars are averaging much higher  His blood sugars are showing the following patterns and has the following problems identified:  FASTING blood sugars are on an average fairly good even with reducing his Toujeo to 36 units  He has had only one relatively low blood sugar waking up and no further nocturnal hypoglycemia  He has taken metformin although may forget it at times  Glucose readings are much higher in the late afternoon and evening before his suppertime and usually averaging well over 200; also difficult to know which readings are before or after supper and he does not marked him again as directed  This is despite not eating a meal in the afternoon but his wife thinks that he is eating snacks like ice cream again  Blood sugars only rarely may be below 200 late evening   He has maintained his weight   Not able to do much exercise, just walking a little  Monitors blood glucose: 1-2 times  a day.  Glucometer:  One Touch.  Glucose readings     Mean values apply  above for all meters except median for One Touch  PRE-MEAL Fasting  5-7 PM  7-10 PM  Bedtime Overall  Glucose range: 50-236  81-347  178-354     Mean/median: 135  264  281   183    Dietician visit: Most recent:1/14.  CDE visit 10/16   Exercise: a little walking  Wt Readings from Last 3 Encounters:  11/03/15 189 lb (85.7 kg)  08/03/15 189 lb 3.2 oz (85.8 kg)  06/01/15 189 lb 3.2 oz (85.8 kg)   Lab Results  Component Value Date   HGBA1C 7.4 (H) 10/31/2015   HGBA1C 7.1 (H) 07/31/2015   HGBA1C 7.9 06/05/2015   Lab Results  Component Value Date   MICROALBUR 2.9 (H) 03/08/2015   LDLCALC 53 10/31/2015   CREATININE 1.27 10/31/2015   Other ACTIVE problems are discussed in review of systems    Lab on 10/31/2015  Component Date Value Ref Range Status  . Hgb A1c MFr Bld 10/31/2015 7.4* 4.6 - 6.5 % Final  . Sodium 10/31/2015 140  135 - 145 mEq/L Final  . Potassium 10/31/2015 4.9  3.5 - 5.1 mEq/L Final  . Chloride 10/31/2015 106  96 - 112 mEq/L Final  . CO2 10/31/2015 27  19 - 32 mEq/L Final  . Glucose, Bld 10/31/2015 107* 70 - 99 mg/dL Final  . BUN 10/31/2015  21  6 - 23 mg/dL Final  . Creatinine, Ser 10/31/2015 1.27  0.40 - 1.50 mg/dL Final  . Total Bilirubin 10/31/2015 0.3  0.2 - 1.2 mg/dL Final  . Alkaline Phosphatase 10/31/2015 80  39 - 117 U/L Final  . AST 10/31/2015 11  0 - 37 U/L Final  . ALT 10/31/2015 6  0 - 53 U/L Final  . Total Protein 10/31/2015 6.8  6.0 - 8.3 g/dL Final  . Albumin 10/31/2015 3.8  3.5 - 5.2 g/dL Final  . Calcium 10/31/2015 9.0  8.4 - 10.5 mg/dL Final  . GFR 10/31/2015 57.90* >60.00 mL/min Final  . Cholesterol 10/31/2015 117  0 - 200 mg/dL Final  . Triglycerides 10/31/2015 98.0  0.0 - 149.0 mg/dL Final  . HDL 10/31/2015 44.60  >39.00 mg/dL Final  . VLDL 10/31/2015 19.6  0.0 - 40.0 mg/dL Final  . LDL Cholesterol 10/31/2015 53  0 - 99 mg/dL Final  . Total CHOL/HDL Ratio 10/31/2015 3   Final  . NonHDL 10/31/2015 72.82   Final  . WBC 10/31/2015 10.2   4.0 - 10.5 K/uL Final  . RBC 10/31/2015 4.41  4.22 - 5.81 Mil/uL Final  . Platelets 10/31/2015 168.0  150.0 - 400.0 K/uL Final  . Hemoglobin 10/31/2015 12.3* 13.0 - 17.0 g/dL Final  . HCT 10/31/2015 38.2* 39.0 - 52.0 % Final  . MCV 10/31/2015 86.6  78.0 - 100.0 fl Final  . MCHC 10/31/2015 32.2  30.0 - 36.0 g/dL Final  . RDW 10/31/2015 15.7* 11.5 - 15.5 % Final    OTHER active problems addressed today are reviewed in review of systems     Medication List       Accurate as of 11/03/15 11:59 PM. Always use your most recent med list.          acetaminophen 500 MG tablet Commonly known as:  TYLENOL Take 500 mg by mouth every 8 (eight) hours as needed for mild pain, moderate pain or headache.   aspirin EC 81 MG tablet 1 tablet by mouth every morning   budesonide-formoterol 160-4.5 MCG/ACT inhaler Commonly known as:  SYMBICORT Inhale 2 puffs into the lungs 2 (two) times daily.   CRESTOR 10 MG tablet Generic drug:  rosuvastatin TAKE ONE TABLET BY MOUTH ONCE DAILY   finasteride 5 MG tablet Commonly known as:  PROSCAR Take 5 mg by mouth every morning.   gabapentin 300 MG capsule Commonly known as:  NEURONTIN TAKE ONE CAPSULE BY MOUTH 4 TIMES DAILY   glucose blood test strip Commonly known as:  ONE TOUCH ULTRA TEST Use as instructed to check blood sugar 3 times per day dx code E11.65   insulin lispro protamine-lispro (75-25) 100 UNIT/ML Susp injection Commonly known as:  HUMALOG 75/25 MIX Inject 24 Units into the skin 2 (two) times daily with a meal.   HUMALOG MIX 75/25 KWIKPEN (75-25) 100 UNIT/ML Kwikpen Generic drug:  Insulin Lispro Prot & Lispro INJECT 20 UNITS SUBCUTANEOUSLY IN THE MORNING AND 30 UNITS SUBCUTANEOUSLY IN THE EVENING DAILY   levothyroxine 112 MCG tablet Commonly known as:  SYNTHROID, LEVOTHROID TAKE ONE TABLET BY MOUTH ONCE DAILY   menthol-thymol Liqd Apply 1 application topically as needed. For pain.   metFORMIN 500 MG tablet Commonly known as:   GLUCOPHAGE Take 1,000 mg by mouth daily with breakfast.   metFORMIN 500 MG 24 hr tablet Commonly known as:  GLUCOPHAGE-XR TAKE THREE TABLETS BY MOUTH ONCE DAILY WITH SUPPER   NASACORT AQ 55 MCG/ACT Aero nasal  inhaler Generic drug:  triamcinolone Place 2 sprays into the nose daily.   oxyCODONE-acetaminophen 5-325 MG tablet Commonly known as:  ROXICET Take 1 tablet by mouth every 8 (eight) hours as needed for severe pain.   polyethylene glycol packet Commonly known as:  MIRALAX / GLYCOLAX Take 17 g by mouth daily as needed for mild constipation, moderate constipation or severe constipation. 1 capful daily as needed   QUEtiapine 25 MG tablet Commonly known as:  SEROQUEL Take 1 tablet (25 mg total) by mouth at bedtime.   TOUJEO SOLOSTAR 300 UNIT/ML Sopn Generic drug:  Insulin Glargine INJECT 42 UNITS SUBCUTANEOUSLY EVERY MORNING   TUSSIN DM 10-100 MG/5ML liquid Generic drug:  dextromethorphan-guaiFENesin Take 10 mLs by mouth every 4 (four) hours as needed for cough.   VANTAS Ulen Inject into the skin daily. Pt has an implant of this medication.   vitamin B-12 1000 MCG tablet Commonly known as:  CYANOCOBALAMIN Take 1,000 mcg by mouth daily.       Allergies:  Allergies  Allergen Reactions  . Penicillins Swelling    Eyes and lips  . Prednisone Other (See Comments)    Dizziness     Past Medical History:  Diagnosis Date  . Anemia    hx of year ago no problems now per pt   . Arthritis    generalized   . Asthma   . Atypical chest pain   . Bladder neck contracture   . Chronic back pain   . Complication of anesthesia    POST OP HYPOTENSION  . Diverticulosis of colon   . First degree heart block   . GERD (gastroesophageal reflux disease)   . Heart murmur   . History of basal cell carcinoma excision    forehead and x6 area forearms  . History of Bell's palsy    x2  1998 &  2004 left side --  residual slight left eye droop and vace  . History of chronic bronchitis    . History of colon polyps   . History of gout   . History of hypertension   . History of MI (myocardial infarction)    08/ 2010  secondary acute renal failure/ dehydration--  medical management  . History of pulmonary embolus (PE)    2010  . Hypogonadism male   . Hypotension   . Hypothyroidism   . OSA (obstructive sleep apnea)    severe per study 2007/  non-compliant cpap  . Peripheral vascular disease ([place])   . Prostate carcinoma [facility]) urologist-  dr Gaynelle Arabian   DX 2007  Gleason 6  and recurrent 2012  s/p  cryoablation of prostate both times  . RBBB (right bundle branch block)   . Recovering alcoholic in remission ([place])   . Renal calculus, bilateral   . Type 2 diabetes mellitus ([place])     Past Surgical History:  Procedure Laterality Date  . CARDIOVASCULAR STRESS TEST  05-19-2012  dr Tressia Miners turner   normal perfusionstudy/  no ischemia/  attenuation artifact inferoapex region of myocardium/  note gate secondary to rhythm irregularity  . CATARACT EXTRACTION W/ INTRAOCULAR LENS IMPLANT Right 2014  . COLONOSCOPY W/ POLYPECTOMY  last one 08-10-2013  . CYSTOSCOPY WITH URETHRAL DILATATION N/A 01/14/2014   Procedure: URETHRAL MEATAL DILATATION;  Surgeon: Ailene Rud, MD;  Location: [facility];  Service: Urology;  Laterality: N/A;  . EXCISION BENIGN SKIN LESION OF BACK  10/ 2011  . HIATAL HERNIA REPAIR  1983  . INGUINAL  HERNIA REPAIR Right 10-14-2001  . KNEE ARTHROSCOPY W/ MENISCECTOMY Right 01-25-2003   and chondroplasty  . [place]  . PROSTATE CRYOABLATION  09-17-2010  &  04-12-2005  . REPAIR RIGHT ARM TENDON INJURY  1975  . SHOULDER OPEN ROTATOR CUFF REPAIR  03/20/2011   Procedure: ROTATOR CUFF REPAIR SHOULDER OPEN;  Surgeon: Tobi Bastos;  Location: [facility];  Service: Orthopedics;  Laterality: Right;  . TONSILLECTOMY  as child  . TRANSTHORACIC ECHOCARDIOGRAM  01-11-2013  dr [name]   moderate LVH/  grade I diastolic dysfunction/ ef  55-65%/  mild AV stenosis /  mild MR/  mild LAE  . TRANSURETHRAL RESECTION OF BLADDER TUMOR N/A 01/14/2014   Procedure: TRANSURETHRAL INCISION OF BLADDER NECK CONTRACTURE;  Surgeon: Ailene Rud, MD;  Location: [facility];  Service: Urology;  Laterality: N/A;  . Vantus Implant     Hormones for Prostate  . VENTRAL HERNIA REPAIR  1998    Family History  Problem Relation Age of Onset  . Heart disease Mother   . Stroke Father   . Cancer Brother     unsure of type    Social History:  reports that he has never smoked. He has never used smokeless tobacco. He reports that he does not drink alcohol or use drugs.  Review of Systems  He has multiple joint pains and Because of his significant pain on the last visit and not controlling the pains with Vicodin he was tried on Percocet. He seems to be doing much better with this and does not think he takes this regularly also He does get some relief with gabapentin for his neuropathic pain  B-12 deficiency:He has been on B12 tablets 1000 mg daily Taking this may have helped his memory  He has  COPD with chronic cough, treated by the pulmonologist and is taking steroid inhaler with good relief of symptoms   HYPERLIPIDEMIA: Well controlled with Crestor   Lab Results  Component Value Date   CHOL 117 10/31/2015   HDL 44.60 10/31/2015   LDLCALC 53 10/31/2015   TRIG 98.0 10/31/2015   CHOLHDL 3 10/31/2015     He was diagnosed have had sleep apnea but he declines CPAP    Does not sleep well execpt in am around 8-11 AM  History of mild hypothyroidism which is well controlled  on 112 g with recent TSH as follows:   Lab Results  Component Value Date   TSH 3.28 07/31/2015   ANEMIA: This is mild and even though he is taking some iron his iron level is not low  Hemoglobin is only minimally different than before  Lab Results  Component Value Date   WBC 10.2 10/31/2015   HGB 12.3 (L) 10/31/2015   HCT 38.2 (L)  10/31/2015   MCV 86.6 10/31/2015   PLT 168.0 10/31/2015        Examination:   BP 130/80   Pulse 70   Ht 6\' 1"  (1.854 m)   Wt 189 lb (85.7 kg)   SpO2 96%   BMI 24.94 kg/m   Body mass index is 24.94 kg/m.      Assesment/Plan:  Diabetes type 2, uncontrolled  See history of present illness for detailed discussion of his current management, blood sugar patterns and problems identified His blood sugars are overall much higher in the afternoons and evenings and not clear if this is related to  Diet He has been more compliant with his  insulin but likely is going off his diet in the afternoons and highest blood sugars are right around 6-9 PM with his taking his first insulin dose around midday Fasting readings are overall well controlled and may be benefiting from metformin  Discussed need for better postprandial control He needs to be consistent with diet and avoid high carbohydrate and high glycemic index foods Also encouraged him to start walking For now will go ahead and start him on a small midafternoon dose of premixed insulin Suppertime dosage will probably be slightly reduced as  also Toujeo to avoid overnight hypoglycemia He will call if he has any inconsistent blood sugar patterns  PAIN management: He has multiple joint pains and neuropathy with now better relief with using Percocet  History of anemia: Stable, likely related to chronic disease, will have him take B12 but no need for iron  To have thyroid levels checked the next visit  Lipids: Well controlled   Patient Instructions  Humalog mix 20 units, 14 units at midafternoon, supper dose 16 units  Toujeo 32 units  Stop iron         Counseling time on subjects discussed above is over 50% of today's 25 minute visit   Saxton Chain 11/05/2015, 9:08 PM

## 2015-11-03 NOTE — Patient Instructions (Addendum)
Humalog mix 20 units, 14 units at midafternoon, supper dose 16 units  Toujeo 32 units  Stop iron

## 2015-11-20 ENCOUNTER — Telehealth: Payer: Self-pay | Admitting: Endocrinology

## 2015-11-20 NOTE — Telephone Encounter (Signed)
Patient need refill of  oxyCODONE-acetaminophen (ROXICET) 5-325 MG tablet

## 2015-11-21 ENCOUNTER — Other Ambulatory Visit: Payer: Self-pay | Admitting: *Deleted

## 2015-11-21 MED ORDER — OXYCODONE-ACETAMINOPHEN 5-325 MG PO TABS: 1.0000 | ORAL_TABLET | Freq: Three times a day (TID) | ORAL | 0 refills | Status: DC | PRN

## 2015-11-25 ENCOUNTER — Emergency Department (HOSPITAL_COMMUNITY): Payer: Medicare Other

## 2015-11-25 ENCOUNTER — Encounter (HOSPITAL_COMMUNITY): Payer: Self-pay | Admitting: Emergency Medicine

## 2015-11-25 ENCOUNTER — Inpatient Hospital Stay (HOSPITAL_COMMUNITY)
Admission: EM | Admit: 2015-11-25 | Discharge: 2015-12-07 | DRG: 234 | Disposition: A | Discharge status: Home Health | Payer: Medicare Other | Attending: Cardiothoracic Surgery | Admitting: Cardiothoracic Surgery

## 2015-11-25 DIAGNOSIS — Z951 Presence of aortocoronary bypass graft: Secondary | ICD-10-CM

## 2015-11-25 DIAGNOSIS — Z8546 Personal history of malignant neoplasm of prostate: Secondary | ICD-10-CM

## 2015-11-25 DIAGNOSIS — Z01810 Encounter for preprocedural cardiovascular examination: Secondary | ICD-10-CM | POA: Diagnosis not present

## 2015-11-25 DIAGNOSIS — E8770 Fluid overload, unspecified: Secondary | ICD-10-CM | POA: Diagnosis not present

## 2015-11-25 DIAGNOSIS — I452 Bifascicular block: Secondary | ICD-10-CM | POA: Diagnosis present

## 2015-11-25 DIAGNOSIS — Z8249 Family history of ischemic heart disease and other diseases of the circulatory system: Secondary | ICD-10-CM

## 2015-11-25 DIAGNOSIS — IMO0002 Reserved for concepts with insufficient information to code with codable children: Secondary | ICD-10-CM | POA: Diagnosis present

## 2015-11-25 DIAGNOSIS — Z981 Arthrodesis status: Secondary | ICD-10-CM

## 2015-11-25 DIAGNOSIS — E039 Hypothyroidism, unspecified: Secondary | ICD-10-CM | POA: Diagnosis present

## 2015-11-25 DIAGNOSIS — D62 Acute posthemorrhagic anemia: Secondary | ICD-10-CM | POA: Diagnosis not present

## 2015-11-25 DIAGNOSIS — E1121 Type 2 diabetes mellitus with diabetic nephropathy: Secondary | ICD-10-CM | POA: Diagnosis present

## 2015-11-25 DIAGNOSIS — I252 Old myocardial infarction: Secondary | ICD-10-CM

## 2015-11-25 DIAGNOSIS — J9811 Atelectasis: Secondary | ICD-10-CM | POA: Diagnosis not present

## 2015-11-25 DIAGNOSIS — Z7982 Long term (current) use of aspirin: Secondary | ICD-10-CM

## 2015-11-25 DIAGNOSIS — E1165 Type 2 diabetes mellitus with hyperglycemia: Secondary | ICD-10-CM | POA: Diagnosis present

## 2015-11-25 DIAGNOSIS — I2584 Coronary atherosclerosis due to calcified coronary lesion: Secondary | ICD-10-CM | POA: Diagnosis present

## 2015-11-25 DIAGNOSIS — T463X5A Adverse effect of coronary vasodilators, initial encounter: Secondary | ICD-10-CM | POA: Diagnosis not present

## 2015-11-25 DIAGNOSIS — I4892 Unspecified atrial flutter: Secondary | ICD-10-CM | POA: Diagnosis not present

## 2015-11-25 DIAGNOSIS — I2510 Atherosclerotic heart disease of native coronary artery without angina pectoris: Secondary | ICD-10-CM | POA: Diagnosis present

## 2015-11-25 DIAGNOSIS — Z85828 Personal history of other malignant neoplasm of skin: Secondary | ICD-10-CM

## 2015-11-25 DIAGNOSIS — Z86711 Personal history of pulmonary embolism: Secondary | ICD-10-CM | POA: Diagnosis not present

## 2015-11-25 DIAGNOSIS — I200 Unstable angina: Secondary | ICD-10-CM | POA: Diagnosis not present

## 2015-11-25 DIAGNOSIS — I2582 Chronic total occlusion of coronary artery: Secondary | ICD-10-CM | POA: Diagnosis present

## 2015-11-25 DIAGNOSIS — I483 Typical atrial flutter: Secondary | ICD-10-CM | POA: Diagnosis not present

## 2015-11-25 DIAGNOSIS — E1151 Type 2 diabetes mellitus with diabetic peripheral angiopathy without gangrene: Secondary | ICD-10-CM | POA: Diagnosis present

## 2015-11-25 DIAGNOSIS — I25110 Atherosclerotic heart disease of native coronary artery with unstable angina pectoris: Secondary | ICD-10-CM | POA: Diagnosis not present

## 2015-11-25 DIAGNOSIS — J45991 Cough variant asthma: Secondary | ICD-10-CM | POA: Diagnosis present

## 2015-11-25 DIAGNOSIS — D509 Iron deficiency anemia, unspecified: Secondary | ICD-10-CM | POA: Diagnosis present

## 2015-11-25 DIAGNOSIS — F0390 Unspecified dementia without behavioral disturbance: Secondary | ICD-10-CM | POA: Diagnosis present

## 2015-11-25 DIAGNOSIS — E7800 Pure hypercholesterolemia, unspecified: Secondary | ICD-10-CM | POA: Diagnosis present

## 2015-11-25 DIAGNOSIS — Z79899 Other long term (current) drug therapy: Secondary | ICD-10-CM

## 2015-11-25 DIAGNOSIS — I499 Cardiac arrhythmia, unspecified: Secondary | ICD-10-CM | POA: Diagnosis not present

## 2015-11-25 DIAGNOSIS — I441 Atrioventricular block, second degree: Secondary | ICD-10-CM | POA: Diagnosis present

## 2015-11-25 DIAGNOSIS — I440 Atrioventricular block, first degree: Secondary | ICD-10-CM | POA: Diagnosis present

## 2015-11-25 DIAGNOSIS — Z7951 Long term (current) use of inhaled steroids: Secondary | ICD-10-CM

## 2015-11-25 DIAGNOSIS — I129 Hypertensive chronic kidney disease with stage 1 through stage 4 chronic kidney disease, or unspecified chronic kidney disease: Secondary | ICD-10-CM | POA: Diagnosis present

## 2015-11-25 DIAGNOSIS — I444 Left anterior fascicular block: Secondary | ICD-10-CM | POA: Diagnosis present

## 2015-11-25 DIAGNOSIS — I083 Combined rheumatic disorders of mitral, aortic and tricuspid valves: Secondary | ICD-10-CM | POA: Diagnosis present

## 2015-11-25 DIAGNOSIS — I442 Atrioventricular block, complete: Secondary | ICD-10-CM | POA: Diagnosis not present

## 2015-11-25 DIAGNOSIS — E785 Hyperlipidemia, unspecified: Secondary | ICD-10-CM | POA: Diagnosis not present

## 2015-11-25 DIAGNOSIS — Z9119 Patient's noncompliance with other medical treatment and regimen: Secondary | ICD-10-CM

## 2015-11-25 DIAGNOSIS — G4733 Obstructive sleep apnea (adult) (pediatric): Secondary | ICD-10-CM | POA: Diagnosis present

## 2015-11-25 DIAGNOSIS — I214 Non-ST elevation (NSTEMI) myocardial infarction: Secondary | ICD-10-CM | POA: Diagnosis not present

## 2015-11-25 DIAGNOSIS — N183 Chronic kidney disease, stage 3 unspecified: Secondary | ICD-10-CM

## 2015-11-25 DIAGNOSIS — Z9689 Presence of other specified functional implants: Secondary | ICD-10-CM

## 2015-11-25 DIAGNOSIS — R262 Difficulty in walking, not elsewhere classified: Secondary | ICD-10-CM

## 2015-11-25 DIAGNOSIS — R079 Chest pain, unspecified: Secondary | ICD-10-CM | POA: Diagnosis not present

## 2015-11-25 DIAGNOSIS — E1122 Type 2 diabetes mellitus with diabetic chronic kidney disease: Secondary | ICD-10-CM | POA: Diagnosis not present

## 2015-11-25 DIAGNOSIS — M1990 Unspecified osteoarthritis, unspecified site: Secondary | ICD-10-CM | POA: Diagnosis present

## 2015-11-25 DIAGNOSIS — E876 Hypokalemia: Secondary | ICD-10-CM | POA: Diagnosis not present

## 2015-11-25 DIAGNOSIS — Z09 Encounter for follow-up examination after completed treatment for conditions other than malignant neoplasm: Secondary | ICD-10-CM

## 2015-11-25 DIAGNOSIS — I4891 Unspecified atrial fibrillation: Secondary | ICD-10-CM | POA: Diagnosis not present

## 2015-11-25 DIAGNOSIS — Z4682 Encounter for fitting and adjustment of non-vascular catheter: Secondary | ICD-10-CM | POA: Diagnosis not present

## 2015-11-25 DIAGNOSIS — J811 Chronic pulmonary edema: Secondary | ICD-10-CM | POA: Diagnosis not present

## 2015-11-25 DIAGNOSIS — R51 Headache: Secondary | ICD-10-CM | POA: Diagnosis not present

## 2015-11-25 DIAGNOSIS — I080 Rheumatic disorders of both mitral and aortic valves: Secondary | ICD-10-CM | POA: Diagnosis present

## 2015-11-25 DIAGNOSIS — I213 ST elevation (STEMI) myocardial infarction of unspecified site: Secondary | ICD-10-CM | POA: Diagnosis not present

## 2015-11-25 DIAGNOSIS — R0602 Shortness of breath: Secondary | ICD-10-CM | POA: Diagnosis not present

## 2015-11-25 DIAGNOSIS — Z794 Long term (current) use of insulin: Secondary | ICD-10-CM | POA: Diagnosis not present

## 2015-11-25 DIAGNOSIS — I110 Hypertensive heart disease with heart failure: Secondary | ICD-10-CM | POA: Diagnosis not present

## 2015-11-25 DIAGNOSIS — R0902 Hypoxemia: Secondary | ICD-10-CM | POA: Diagnosis not present

## 2015-11-25 DIAGNOSIS — Z6826 Body mass index (BMI) 26.0-26.9, adult: Secondary | ICD-10-CM

## 2015-11-25 DIAGNOSIS — E663 Overweight: Secondary | ICD-10-CM | POA: Diagnosis present

## 2015-11-25 HISTORY — DX: Unspecified dementia, unspecified severity, without behavioral disturbance, psychotic disturbance, mood disturbance, and anxiety: F03.90

## 2015-11-25 LAB — BASIC METABOLIC PANEL
Anion gap: 11 (ref 5–15)
BUN: 25 mg/dL — ABNORMAL HIGH (ref 6–20)
CHLORIDE: 113 mmol/L — AB (ref 101–111)
CO2: 16 mmol/L — ABNORMAL LOW (ref 22–32)
CREATININE: 1.37 mg/dL — AB (ref 0.61–1.24)
Calcium: 9.2 mg/dL (ref 8.9–10.3)
GFR calc non Af Amer: 47 mL/min — ABNORMAL LOW (ref >60)
GFR, EST AFRICAN AMERICAN: 55 mL/min — AB (ref >60)
Glucose, Bld: 226 mg/dL — ABNORMAL HIGH (ref 65–99)
POTASSIUM: 4.6 mmol/L (ref 3.5–5.1)
SODIUM: 140 mmol/L (ref 135–145)

## 2015-11-25 LAB — CBC
HEMATOCRIT: 40.1 % (ref 39.0–52.0)
Hemoglobin: 12.4 g/dL — ABNORMAL LOW (ref 13.0–17.0)
MCH: 27.5 pg (ref 26.0–34.0)
MCHC: 30.9 g/dL (ref 30.0–36.0)
MCV: 88.9 fL (ref 78.0–100.0)
PLATELETS: 179 10*3/uL (ref 150–400)
RBC: 4.51 MIL/uL (ref 4.22–5.81)
RDW: 15.3 % (ref 11.5–15.5)
WBC: 13.4 10*3/uL — AB (ref 4.0–10.5)

## 2015-11-25 LAB — GLUCOSE, CAPILLARY: GLUCOSE-CAPILLARY: 198 mg/dL — AB (ref 65–99)

## 2015-11-25 LAB — I-STAT TROPONIN, ED: Troponin i, poc: 0.22 ng/mL (ref 0.00–0.08)

## 2015-11-25 LAB — TROPONIN I: TROPONIN I: 0.99 ng/mL — AB (ref <0.03)

## 2015-11-25 LAB — BRAIN NATRIURETIC PEPTIDE: B Natriuretic Peptide: 1139.9 pg/mL — ABNORMAL HIGH (ref 0.0–100.0)

## 2015-11-25 MED ORDER — INSULIN GLARGINE 100 UNIT/ML ~~LOC~~ SOLN
15.0000 [IU] | Freq: Every day | SUBCUTANEOUS | Status: DC
  Administered 2015-11-26 – 2015-11-28 (×3): 15 [IU] via SUBCUTANEOUS
  Filled 2015-11-25 (×5): qty 0.15

## 2015-11-25 MED ORDER — NITROGLYCERIN 0.4 MG SL SUBL
0.4000 mg | SUBLINGUAL_TABLET | SUBLINGUAL | Status: AC | PRN
  Administered 2015-11-25 (×3): 0.4 mg via SUBLINGUAL
  Filled 2015-11-25: qty 1

## 2015-11-25 MED ORDER — INSULIN ASPART 100 UNIT/ML ~~LOC~~ SOLN
0.0000 [IU] | Freq: Three times a day (TID) | SUBCUTANEOUS | Status: DC
  Administered 2015-11-26: 2 [IU] via SUBCUTANEOUS
  Administered 2015-11-26: 3 [IU] via SUBCUTANEOUS
  Administered 2015-11-26: 2 [IU] via SUBCUTANEOUS
  Administered 2015-11-27 – 2015-11-28 (×2): 3 [IU] via SUBCUTANEOUS
  Administered 2015-11-28: 2 [IU] via SUBCUTANEOUS

## 2015-11-25 MED ORDER — ROSUVASTATIN CALCIUM 10 MG PO TABS
10.0000 mg | ORAL_TABLET | Freq: Every day | ORAL | Status: DC
  Administered 2015-11-26 – 2015-12-07 (×11): 10 mg via ORAL
  Filled 2015-11-25 (×11): qty 1

## 2015-11-25 MED ORDER — NITROGLYCERIN IN D5W 200-5 MCG/ML-% IV SOLN
5.0000 ug/min | INTRAVENOUS | Status: DC
  Administered 2015-11-25: 5 ug/min via INTRAVENOUS
  Filled 2015-11-25: qty 250

## 2015-11-25 MED ORDER — ASPIRIN EC 81 MG PO TBEC
81.0000 mg | DELAYED_RELEASE_TABLET | Freq: Every day | ORAL | Status: DC
  Administered 2015-11-26 – 2015-11-28 (×3): 81 mg via ORAL
  Filled 2015-11-25 (×3): qty 1

## 2015-11-25 MED ORDER — ONDANSETRON HCL 4 MG/2ML IJ SOLN: 4.0000 mg | Freq: Four times a day (QID) | INTRAMUSCULAR | Status: DC | PRN

## 2015-11-25 MED ORDER — FINASTERIDE 5 MG PO TABS
5.0000 mg | ORAL_TABLET | Freq: Every day | ORAL | Status: DC
  Administered 2015-11-26 – 2015-12-07 (×11): 5 mg via ORAL
  Filled 2015-11-25 (×11): qty 1

## 2015-11-25 MED ORDER — GABAPENTIN 300 MG PO CAPS
300.0000 mg | ORAL_CAPSULE | Freq: Two times a day (BID) | ORAL | Status: DC
  Administered 2015-11-25 – 2015-12-07 (×22): 300 mg via ORAL
  Filled 2015-11-25 (×22): qty 1

## 2015-11-25 MED ORDER — HEPARIN (PORCINE) IN NACL 100-0.45 UNIT/ML-% IJ SOLN
1100.0000 [IU]/h | INTRAMUSCULAR | Status: DC
  Administered 2015-11-25: 1250 [IU]/h via INTRAVENOUS
  Filled 2015-11-25 (×2): qty 250

## 2015-11-25 MED ORDER — LEVOTHYROXINE SODIUM 112 MCG PO TABS
112.0000 ug | ORAL_TABLET | Freq: Every day | ORAL | Status: DC
  Administered 2015-11-26 – 2015-12-07 (×11): 112 ug via ORAL
  Filled 2015-11-25 (×13): qty 1

## 2015-11-25 MED ORDER — POLYETHYLENE GLYCOL 3350 17 G PO PACK: 17.0000 g | PACK | Freq: Every day | ORAL | Status: DC | PRN

## 2015-11-25 MED ORDER — HEPARIN BOLUS VIA INFUSION
4000.0000 [IU] | Freq: Once | INTRAVENOUS | Status: AC
  Administered 2015-11-25: 4000 [IU] via INTRAVENOUS
  Filled 2015-11-25: qty 4000

## 2015-11-25 MED ORDER — NITROGLYCERIN IN D5W 200-5 MCG/ML-% IV SOLN
60.0000 ug/min | INTRAVENOUS | Status: DC
  Administered 2015-11-25: 30 ug/min via INTRAVENOUS
  Administered 2015-11-27 (×2): 50 ug/min via INTRAVENOUS
  Administered 2015-11-28 (×2): 60 ug/min via INTRAVENOUS
  Filled 2015-11-25 (×8): qty 250

## 2015-11-25 MED ORDER — GUAIFENESIN-DM 100-10 MG/5ML PO SYRP: 10.0000 mL | ORAL_SOLUTION | ORAL | Status: DC | PRN

## 2015-11-25 MED ORDER — MOMETASONE FURO-FORMOTEROL FUM 200-5 MCG/ACT IN AERO
2.0000 | INHALATION_SPRAY | Freq: Two times a day (BID) | RESPIRATORY_TRACT | Status: DC
  Administered 2015-11-26 – 2015-11-28 (×5): 2 via RESPIRATORY_TRACT
  Filled 2015-11-25 (×2): qty 8.8

## 2015-11-25 MED ORDER — OXYCODONE-ACETAMINOPHEN 5-325 MG PO TABS
1.0000 | ORAL_TABLET | Freq: Three times a day (TID) | ORAL | Status: DC | PRN
  Administered 2015-11-26 – 2015-11-29 (×5): 1 via ORAL
  Filled 2015-11-25 (×5): qty 1

## 2015-11-25 MED ORDER — ACETAMINOPHEN 500 MG PO TABS
500.0000 mg | ORAL_TABLET | Freq: Three times a day (TID) | ORAL | Status: DC | PRN
  Administered 2015-11-26 – 2015-11-28 (×4): 500 mg via ORAL
  Filled 2015-11-25 (×4): qty 1

## 2015-11-25 MED ORDER — METOPROLOL TARTRATE 25 MG PO TABS
12.5000 mg | ORAL_TABLET | Freq: Two times a day (BID) | ORAL | Status: DC
  Filled 2015-11-25: qty 1

## 2015-11-25 MED ORDER — POLYVINYL ALCOHOL 1.4 % OP SOLN
1.0000 [drp] | Freq: Three times a day (TID) | OPHTHALMIC | Status: DC | PRN
  Filled 2015-11-25: qty 15

## 2015-11-25 NOTE — ED Notes (Signed)
Attempted report 

## 2015-11-25 NOTE — Progress Notes (Signed)
ANTICOAGULATION CONSULT NOTE - Initial Consult  Pharmacy Consult for heparin Indication: chest pain/ACS  Allergies  Allergen Reactions  . Penicillins Swelling    Eyes and lips Has patient had a PCN reaction causing immediate rash, facial/tongue/throat swelling, SOB or lightheadedness with hypotension: Yes Has patient had a PCN reaction causing severe rash involving mucus membranes or skin necrosis: No Has patient had a PCN reaction that required hospitalization No Has patient had a PCN reaction occurring within the last 10 years: No If all of the above answers are "NO", then may proceed with Cephalosporin use   . Prednisone Other (See Comments)    Dizziness     Patient Measurements:   Heparin Dosing Weight: 85kg  Vital Signs: Temp: 97.8 F (36.6 C) (09/16 1806) Temp Source: Oral (09/16 1806) BP: 128/87 (09/16 1915) Pulse Rate: 95 (09/16 1915)  Labs:  Recent Labs  11/25/15 1833  HGB 12.4*  HCT 40.1  PLT 179  CREATININE 1.37*    CrCl cannot be calculated (Unknown ideal weight.).   Medical History: Past Medical History:  Diagnosis Date  . Anemia    hx of year ago no problems now per pt   . Arthritis    generalized   . Asthma   . Atypical chest pain   . Bladder neck contracture   . Chronic back pain   . Complication of anesthesia    POST OP HYPOTENSION  . Diverticulosis of colon   . First degree heart block   . GERD (gastroesophageal reflux disease)   . Heart murmur   . History of basal cell carcinoma excision    forehead and x6 area forearms  . History of Bell's palsy    x2  1998 &  2004 left side --  residual slight left eye droop and vace  . History of chronic bronchitis   . History of colon polyps   . History of gout   . History of hypertension   . History of MI (myocardial infarction)    08/ 2010  secondary acute renal failure/ dehydration--  medical management  . History of pulmonary embolus (PE)    2010  . Hypogonadism male   . Hypotension    . Hypothyroidism   . OSA (obstructive sleep apnea)    severe per study 2007/  non-compliant cpap  . Peripheral vascular disease ([place])   . Prostate carcinoma [facility]) urologist-  dr Gaynelle Arabian   DX 2007  Gleason 6  and recurrent 2012  s/p  cryoablation of prostate both times  . RBBB (right bundle branch block)   . Recovering alcoholic in remission ([place])   . Renal calculus, bilateral   . Type 2 diabetes mellitus ([facility])     Medications:  Scheduled:   Infusions:  . nitroGLYCERIN 10 mcg/min (11/25/15 1935)    Assessment: 82 yo who was admitted for CP. IV heparin has been ordered to r/o MI. Pt is not on any anticoagulant PTA.    Goal of Therapy:  Heparin level 0.3-0.7 units/ml Monitor platelets by anticoagulation protocol: Yes   Plan:   Heparin 4000 units x1 Heparin drip at 1250 units/hr F/u with AM heparin level and CBC  Onnie Boer, PharmD Pager: [phone] 11/25/2015 7:52 PM

## 2015-11-25 NOTE — ED Provider Notes (Addendum)
[place] DEPT Provider Note   CSN: [id number] Arrival date & time: 11/25/15  1746     History   Chief Complaint Chief Complaint  Patient presents with  . Chest Pain    HPI Barry KETCHERSIDE Sr. is a 82 y.o. male.  Patient is a 82 year old male patient with a history of diabetes, hypertension, hyperlipidemia, chronic back pain on oxycodone who presents with chest pain. He states he had a little episode yesterday which went away and then about 2 hours prior to arrival he had more intense pain across his chest. It's otherwise nonradiating. He describes as a pressure feeling. He has some mild increase in his shortness of breath. He has a chronic cough which is baseline. He denies any nausea or vomiting. No fevers. No increase in leg swelling. The pain is not related to movement or breathing. He denies any prior history of similar pain. He was given 4 baby aspirin to EMS prior to arrival.    Chest Pain   Associated symptoms include cough and shortness of breath. Pertinent negatives include no abdominal pain, no back pain, no diaphoresis, no dizziness, no fever, no headaches, no nausea, no numbness, no vomiting and no weakness.    Past Medical History:  Diagnosis Date  . Anemia    hx of year ago no problems now per pt   . Arthritis    generalized   . Asthma   . Atypical chest pain   . Bladder neck contracture   . Chronic back pain   . Complication of anesthesia    POST OP HYPOTENSION  . Diverticulosis of colon   . First degree heart block   . GERD (gastroesophageal reflux disease)   . Heart murmur   . History of basal cell carcinoma excision    forehead and x6 area forearms  . History of Bell's palsy    x2  1998 &  2004 left side --  residual slight left eye droop and vace  . History of chronic bronchitis   . History of colon polyps   . History of gout   . History of hypertension   . History of MI (myocardial infarction)    08/ 2010  secondary acute renal failure/  dehydration--  medical management  . History of pulmonary embolus (PE)    2010  . Hypogonadism male   . Hypotension   . Hypothyroidism   . OSA (obstructive sleep apnea)    severe per study 2007/  non-compliant cpap  . Peripheral vascular disease ([place])   . Prostate carcinoma [facility]) urologist-  dr Gaynelle Arabian   DX 2007  Gleason 6  and recurrent 2012  s/p  cryoablation of prostate both times  . RBBB (right bundle branch block)   . Recovering alcoholic in remission ([place])   . Renal calculus, bilateral   . Type 2 diabetes mellitus [facility])     Patient Active Problem List   Diagnosis Date Noted  . NSTEMI (non-ST elevated myocardial infarction) ([place]) 11/25/2015  . CKD (chronic kidney disease), stage III 11/25/2015  . Memory loss 01/04/2015  . Cough variant asthma 10/12/2013  . Iron deficiency anemia 07/23/2013  . Irregular heart beat 12/11/2012  . Type II diabetes mellitus, uncontrolled ([place]) 10/12/2012  . Mitral regurgitation and aortic stenosis 10/12/2012  . Pure hypercholesterolemia 10/12/2012  . Hypothyroidism 10/12/2012  . Osteoarthritis 10/12/2012  . Prostate cancer ([place]) 10/12/2012    Past Surgical History:  Procedure Laterality Date  . CARDIOVASCULAR STRESS TEST  05-19-2012  dr Tressia Miners turner   normal perfusionstudy/  no ischemia/  attenuation artifact inferoapex region of myocardium/  note gate secondary to rhythm irregularity  . CATARACT EXTRACTION W/ INTRAOCULAR LENS IMPLANT Right 2014  . COLONOSCOPY W/ POLYPECTOMY  last one 08-10-2013  . CYSTOSCOPY WITH URETHRAL DILATATION N/A 01/14/2014   Procedure: URETHRAL MEATAL DILATATION;  Surgeon: Ailene Rud, MD;  Location: [facility];  Service: Urology;  Laterality: N/A;  . EXCISION BENIGN SKIN LESION OF BACK  10/ 2011  . HIATAL HERNIA REPAIR  1983  . INGUINAL HERNIA REPAIR Right 10-14-2001  . KNEE ARTHROSCOPY W/ MENISCECTOMY Right 01-25-2003   and chondroplasty  . [place]  . PROSTATE  CRYOABLATION  09-17-2010  &  04-12-2005  . REPAIR RIGHT ARM TENDON INJURY  1975  . SHOULDER OPEN ROTATOR CUFF REPAIR  03/20/2011   Procedure: ROTATOR CUFF REPAIR SHOULDER OPEN;  Surgeon: Tobi Bastos;  Location: [facility];  Service: Orthopedics;  Laterality: Right;  . TONSILLECTOMY  as child  . TRANSTHORACIC ECHOCARDIOGRAM  01-11-2013  dr [name]   moderate LVH/  grade I diastolic dysfunction/ ef [id number]  mild AV stenosis /  mild MR/  mild LAE  . TRANSURETHRAL RESECTION OF BLADDER TUMOR N/A 01/14/2014   Procedure: TRANSURETHRAL INCISION OF BLADDER NECK CONTRACTURE;  Surgeon: Ailene Rud, MD;  Location[facility];  Service: Urology;  Laterality: N/A;  . [place] Implant     Hormones for Prostate  . [place] Medications    Prior to Admission medications   Medication Sig Start Date End Date Taking? Authorizing Provider  acetaminophen (TYLENOL) 500 MG tablet Take 500 mg by mouth every 8 (eight) hours as needed for mild pain, moderate pain or headache.    Yes Historical Provider, MD  aspirin EC 81 MG tablet Take 81 mg by mouth daily.    Yes Historical Provider, MD  budesonide-formoterol (SYMBICORT) 160-4.5 MCG/ACT inhaler Inhale 2 puffs into the lungs 2 (two) times daily. 03/08/14  Yes Tanda Rockers, MD  CRESTOR 10 MG tablet TAKE ONE TABLET BY MOUTH ONCE DAILY 07/21/15  Yes [name], MD  dextromethorphan-guaiFENesin (TUSSIN DM) 10-100 MG/5ML liquid Take 10 mLs by mouth every 4 (four) hours as needed for cough.    Yes Historical Provider, MD  finasteride (PROSCAR) 5 MG tablet Take 5 mg by mouth every morning.    Yes Historical Provider, MD  gabapentin (NEURONTIN) 300 MG capsule TAKE ONE CAPSULE BY MOUTH 4 TIMES DAILY Patient taking differently: Take 300 mg by mouth in the morning and 300 mg in the evening (may also takes 2 more times daily as needed for nerve pain) 03/28/15  Yes [name], MD  glucose blood (ONE TOUCH ULTRA TEST) test strip  Use as instructed to check blood sugar 3 times per day dx code E11.65 09/18/15  Yes [name], MD  HUMALOG MIX 75/25 KWIKPEN (75-25) 100 UNIT/ML Kwikpen INJECT 20 UNITS SUBCUTANEOUSLY IN THE MORNING AND 30 UNITS SUBCUTANEOUSLY IN THE EVENING DAILY Patient taking differently: Inject 20 units into the skin in the morning then 14 units in the afternoon then 16 units at bedtime 10/06/15  Yes [name], MD  levothyroxine (SYNTHROID, LEVOTHROID) 112 MCG tablet TAKE ONE TABLET BY MOUTH ONCE DAILY Patient taking differently: Take 112 mcg by mouth once a day 10/30/15  Yes [name], MD  menthol-thymol (ABSORBINE JR) LIQD Apply 1 application  topically as needed. For pain.   Yes Historical Provider, MD  metFORMIN (GLUCOPHAGE-XR) 500 MG 24 hr tablet TAKE THREE TABLETS BY MOUTH ONCE DAILY WITH SUPPER Patient taking differently: Take 1,000 mg by mouth in the evening 04/03/15  Yes [name], MD  oxyCODONE-acetaminophen (ROXICET) 5-325 MG tablet Take 1 tablet by mouth every 8 (eight) hours as needed for severe pain. 11/21/15  Yes [name], MD  Polyethyl Glycol-Propyl Glycol (SYSTANE) 0.4-0.3 % SOLN Apply 1-2 drops to eye 3 (three) times daily as needed (for dry eyes).   Yes Historical Provider, MD  polyethylene glycol (MIRALAX / GLYCOLAX) packet Take 17 g by mouth daily as needed for mild constipation, moderate constipation or severe constipation. 1 capful daily as needed   Yes Historical Provider, MD  TOUJEO SOLOSTAR 300 UNIT/ML SOPN INJECT [place] Patient taking differently: Inject 32 units into the skin in the morning 08/14/15  Yes [name], MD  triamcinolone (NASACORT AQ) 55 MCG/ACT AERO nasal inhaler Place 1-2 sprays into the nose daily.    Yes Historical Provider, MD  vitamin B-12 (CYANOCOBALAMIN) 1000 MCG tablet Take 1,000 mcg by mouth daily.   Yes Historical Provider, MD  QUEtiapine (SEROQUEL) 25 MG tablet Take 1 tablet (25 mg total) by mouth at bedtime. Patient not taking:  Reported on 11/25/2015 07/12/15   [name], MD    Family History Family History  Problem Relation Age of Onset  . Heart disease Mother   . Stroke Father   . Cancer Brother     unsure of type    Social History Social History  Substance Use Topics  . Smoking status: Never Smoker  . Smokeless tobacco: Never Used  . Alcohol use No     Comment: RECOVERING ALCOHOLIC none since [id number]      Allergies   Penicillins and Prednisone   Review of Systems Review of Systems  Constitutional: Negative for chills, diaphoresis, fatigue and fever.  HENT: Negative for congestion, rhinorrhea and sneezing.   Eyes: Negative.   Respiratory: Positive for cough and shortness of breath. Negative for chest tightness.   Cardiovascular: Positive for chest pain. Negative for leg swelling.  Gastrointestinal: Negative for abdominal pain, blood in stool, diarrhea, nausea and vomiting.  Genitourinary: Negative for difficulty urinating, flank pain, frequency and hematuria.  Musculoskeletal: Negative for arthralgias and back pain.  Skin: Negative for rash.  Neurological: Negative for dizziness, speech difficulty, weakness, numbness and headaches.     Physical Exam Updated Vital Signs BP 143/97   Pulse 93   Temp 97.8 F (36.6 C) (Oral)   Resp 21   Ht 6\' 1"  (1.854 m)   Wt 188 lb 15 oz (85.7 kg)   SpO2 99%   BMI 24.93 kg/m   Physical Exam  Constitutional: He is oriented to person, place, and time. He appears well-developed and well-nourished. He appears distressed (Appears uncomfortable).  HENT:  Head: Normocephalic and atraumatic.  Eyes: Pupils are equal, round, and reactive to light.  Neck: Normal range of motion. Neck supple.  Cardiovascular: Normal rate, regular rhythm and normal heart sounds.   Pulmonary/Chest: Effort normal and breath sounds normal. No respiratory distress. He has no wheezes. He has no rales. He exhibits no tenderness.  Rhonchi bilaterally  Abdominal: Soft. Bowel sounds are  normal. There is no tenderness. There is no rebound and no guarding.  Musculoskeletal: Normal range of motion. He exhibits no edema.  Lymphadenopathy:    He has no cervical adenopathy.  Neurological: He  is alert and oriented to person, place, and time.  Skin: Skin is warm and dry. No rash noted.  Psychiatric: He has a normal mood and affect.     ED Treatments / Results  Labs (all labs ordered are listed, but only abnormal results are displayed) Labs Reviewed  BASIC METABOLIC PANEL - Abnormal; Notable for the following:       Result Value   Chloride 113 (*)    CO2 16 (*)    Glucose, Bld 226 (*)    BUN 25 (*)    Creatinine, Ser 1.37 (*)    GFR calc non Af Amer 47 (*)    GFR calc Af Amer 55 (*)    All other components within normal limits  CBC - Abnormal; Notable for the following:    WBC 13.4 (*)    Hemoglobin 12.4 (*)    All other components within normal limits  I-STAT TROPOININ, ED - Abnormal; Notable for the following:    Troponin i, poc 0.22 (*)    All other components within normal limits  HEPARIN LEVEL (UNFRACTIONATED)  CBC  TROPONIN I  BRAIN NATRIURETIC PEPTIDE  TSH  BASIC METABOLIC PANEL  PROTIME-INR  TROPONIN I  TROPONIN I    EKG  EKG Interpretation  Date/Time:  Saturday November 25 2015 18:41:06 EDT Ventricular Rate:  99 PR Interval:    QRS Duration: 128 QT Interval:  382 QTC Calculation: 491 R Axis:   -62 Text Interpretation:  Sinus rhythm RBBB and LAFB LVH with secondary repolarization abnormality subtle changes to ST segments inferiorly Confirmed by Cobain Morici  MD, Sala[place] Battershell ([id number]) on 11/25/2015 7:23:55 PM       Radiology Dg Chest Port 1 View  Result Date: 11/25/2015 CLINICAL DATA:  Chest pain, shortness of breath, pain down both arms all day, hypertension, diabetes mellitus, asthma, prior MI, prostate cancer EXAM: PORTABLE CHEST 1 VIEW COMPARISON:  Portable exam 1832 hours compared 10/12/2013 FINDINGS: Enlargement of cardiac silhouette with minimal  vascular congestion. Atherosclerotic calcification aorta. Emphysematous changes compatible with COPD. Interstitial changes at the lung bases bilaterally question interstitial infiltrates and edema with probable component of atelectasis as well. Upper lungs clear. No pleural effusion or pneumothorax. Bones demineralized. IMPRESSION: Enlargement of cardiac silhouette with minimal vascular congestion. Aortic atherosclerosis. COPD changes with mild interstitial edema at the lung bases and probable component of atelectasis. Electronically Signed   By: Lavonia Dana M.D.   On: 11/25/2015 18:41    Procedures Procedures (including critical care time)  Medications Ordered in ED Medications  heparin ADULT infusion 100 units/mL (25000 units/249mL sodium chloride 0.45%) (1,250 Units/hr Intravenous New Bag/Given 11/25/15 2020)  Polyethyl Glycol-Propyl Glycol 0.4-0.3 % SOLN 1-2 drop (not administered)  oxyCODONE-acetaminophen (PERCOCET/ROXICET) 5-325 MG per tablet 1 tablet (not administered)  levothyroxine (SYNTHROID, LEVOTHROID) tablet 112 mcg (not administered)  rosuvastatin (CRESTOR) tablet 10 mg (not administered)  gabapentin (NEURONTIN) capsule 300 mg (not administered)  mometasone-formoterol (DULERA) 200-5 MCG/ACT inhaler 2 puff (not administered)  acetaminophen (TYLENOL) tablet 500 mg (not administered)  dextromethorphan-guaiFENesin (ROBITUSSIN-DM) 10-100 MG/5ML liquid 10 mL (not administered)  polyethylene glycol (MIRALAX / GLYCOLAX) packet 17 g (not administered)  aspirin EC tablet 81 mg (not administered)  finasteride (PROSCAR) tablet 5 mg (not administered)  ondansetron (ZOFRAN) injection 4 mg (not administered)  metoprolol tartrate (LOPRESSOR) tablet 12.5 mg (not administered)  insulin aspart (novoLOG) injection 0-15 Units (not administered)  insulin glargine (LANTUS) injection 15 Units (not administered)  nitroGLYCERIN 50 mg in dextrose 5 % 250 mL (0.2  mg/mL) infusion (not administered)    nitroGLYCERIN (NITROSTAT) SL tablet 0.4 mg (0.4 mg Sublingual Given 11/25/15 1850)  heparin bolus via infusion 4,000 Units (4,000 Units Intravenous Bolus from Bag 11/25/15 2021)     Initial Impression / Assessment and Plan / ED Course  I have reviewed the triage vital signs and the nursing notes.  Pertinent labs & imaging results that were available during my care of the patient were reviewed by me and considered in my medical decision making (see chart for details).  Clinical Course    Patient presents with chest pain and diaphoresis. His pain has improved but not resolved on nitroglycerin and he was started on a nitro drip.  His EKG shows an unusual ST segment which I feel has an embedded P wave. It does appear changed from his prior EKG but I don't feel that it needs STEMI activation criteria. He's had 3 EKGs in the ED which all look fairly similar. I consulted with the cardiology fellow on call who also reviewed the EKGs and agreed. He was started on heparin. He will be admitted to the cardiology service. He was given aspirin I EMS prior to arrival. He also had a positive troponin.  CRITICAL CARE Performed by: Berdell Nevitt Total critical care time: 40 minutes Critical care time was exclusive of separately billable procedures and treating other patients. Critical care was necessary to treat or prevent imminent or life-threatening deterioration. Critical care was time spent personally by me on the following activities: development of treatment plan with patient and/or surrogate as well as nursing, discussions with consultants, evaluation of patient's response to treatment, examination of patient, obtaining history from patient or surrogate, ordering and performing treatments and interventions, ordering and review of laboratory studies, ordering and review of radiographic studies, pulse oximetry and re-evaluation of patient's condition.   Final Clinical Impressions(s) / ED Diagnoses    Final diagnoses:  Unstable angina ([place])  NSTEMI (non-ST elevated myocardial infarction) [facility])    New Prescriptions New Prescriptions   No medications on file     Malvin Johns, MD 11/25/15 2056    Malvin Johns, MD 11/25/15 2139

## 2015-11-25 NOTE — ED Notes (Signed)
Started in hallway; room not available. Moved to room at [place].

## 2015-11-25 NOTE — ED Notes (Signed)
Given 324mg  ASA at home.

## 2015-11-25 NOTE — H&P (Addendum)
Primary Cardiologist: [name] PCP: [name], MD  Chief Complaint: chest pain  HPI: 82 YO without known CAD, but risk factors including DM II on insulin, HLD, HTN, hypothyroidism, strong family history of CAD, mild to moderate aortic stenosis, untreated OSA, and chronic RBBB/1st degree AVB/LAFB presenting with acute onset chest pain and diaphoresis this evening to the ED via EMS.  Lives in [place], presents with his wife at bedside.  Was in usual state of health until acute onset substernal chest pain (not worsened by movement/palpation) 101/10 and acute diaphoresis.  EMS called and gave 325 ASA.  In ED, SL NTG greatly improved symptoms and IV NTG has brought pain down to ~1/10.    He notes that he has been experiencing accelerating angina for 2 months, previously with exertion and random-- now progressive to the current episode.  Previous stress tests were not ordered for this kind of pain-- this is new.    No bleeding bruising issues, has a chronic cough that is unchanged.  No new edema or orthopnea.  No upcoming surgeries or dye allergies.    Past Cardiographics: Echo 12/11/12 Study Conclusions  - Left ventricle: The cavity size was normal. Wall thickness was increased in a pattern of moderate LVH. Systolic function was normal. The estimated ejection fraction was in the range of 55% to 65%. Wall motion was normal; there were no regional wall motion abnormalities. Doppler parameters are consistent with abnormal left ventricular relaxation (grade 1 diastolic dysfunction). - Aortic valve: There was mild stenosis. - Mitral valve: Calcified annulus. Mildly thickened leaflets . Mild regurgitation. - Left atrium: The atrium was mildly dilated.  ETT:  09/14/14  Max HR 117 no ischemia or high grade HB  Inconclusive Lexiscan 2014   Past Medical History:  Diagnosis Date  . Anemia    hx of year ago no problems now per pt   . Arthritis    generalized   . Asthma   . Atypical chest pain    . Bladder neck contracture   . Chronic back pain   . Complication of anesthesia    POST OP HYPOTENSION  . Diverticulosis of colon   . First degree heart block   . GERD (gastroesophageal reflux disease)   . Heart murmur   . History of basal cell carcinoma excision    forehead and x6 area forearms  . History of Bell's palsy    x2  1998 &  2004 left side --  residual slight left eye droop and vace  . History of chronic bronchitis   . History of colon polyps   . History of gout   . History of hypertension   . History of MI (myocardial infarction)    08/ 2010  secondary acute renal failure/ dehydration--  medical management  . History of pulmonary embolus (PE)    2010  . Hypogonadism male   . Hypotension   . Hypothyroidism   . OSA (obstructive sleep apnea)    severe per study 2007/  non-compliant cpap  . Peripheral vascular disease ([place])   . Prostate carcinoma [facility]) urologist-  dr Gaynelle Arabian   DX 2007  Gleason 6  and recurrent 2012  s/p  cryoablation of prostate both times  . RBBB (right bundle branch block)   . Recovering alcoholic in remission ([place])   . Renal calculus, bilateral   . Type 2 diabetes mellitus ([place])     Past Surgical History:  Procedure Laterality Date  . CARDIOVASCULAR STRESS TEST  05-19-2012  dr Tressia Miners turner   normal perfusionstudy/  no ischemia/  attenuation artifact inferoapex region of myocardium/  note gate secondary to rhythm irregularity  . CATARACT EXTRACTION W/ INTRAOCULAR LENS IMPLANT Right 2014  . COLONOSCOPY W/ POLYPECTOMY  last one 08-10-2013  . CYSTOSCOPY WITH URETHRAL DILATATION N/A 01/14/2014   Procedure: URETHRAL MEATAL DILATATION;  Surgeon: Ailene Rud, MD;  Location: [facility];  Service: Urology;  Laterality: N/A;  . EXCISION BENIGN SKIN LESION OF BACK  10/ 2011  . HIATAL HERNIA REPAIR  1983  . INGUINAL HERNIA REPAIR Right 10-14-2001  . KNEE ARTHROSCOPY W/ MENISCECTOMY Right 01-25-2003   and chondroplasty  .  [place]  . PROSTATE CRYOABLATION  09-17-2010  &  04-12-2005  . REPAIR RIGHT ARM TENDON INJURY  1975  . SHOULDER OPEN ROTATOR CUFF REPAIR  03/20/2011   Procedure: ROTATOR CUFF REPAIR SHOULDER OPEN;  Surgeon: Tobi Bastos;  Location: [facility];  Service: Orthopedics;  Laterality: Right;  . TONSILLECTOMY  as child  . TRANSTHORACIC ECHOCARDIOGRAM  01-11-2013  dr [name]   moderate LVH/  grade I diastolic dysfunction/ ef 65-03%/  mild AV stenosis /  mild MR/  mild LAE  . TRANSURETHRAL RESECTION OF BLADDER TUMOR N/A 01/14/2014   Procedure: TRANSURETHRAL INCISION OF BLADDER NECK CONTRACTURE;  Surgeon: Ailene Rud, MD;  Location: [facility];  Service: Urology;  Laterality: N/A;  . Vantus Implant     Hormones for Prostate  . VENTRAL HERNIA REPAIR  1998    Family History  Problem Relation Age of Onset  . Heart disease Mother   . Stroke Father   . Cancer Brother     unsure of type   Social History:  reports that he has never smoked. He has never used smokeless tobacco. He reports that he does not drink alcohol or use drugs.  Allergies:  Allergies  Allergen Reactions  . Penicillins Swelling    Eyes and lips Has patient had a PCN reaction causing immediate rash, facial/tongue/throat swelling, SOB or lightheadedness with hypotension: Yes Has patient had a PCN reaction causing severe rash involving mucus membranes or skin necrosis: No Has patient had a PCN reaction that required hospitalization No Has patient had a PCN reaction occurring within the last 10 years: No If all of the above answers are "NO", then may proceed with Cephalosporin use   . Prednisone Other (See Comments)    Hyperglycemia and AMS    No current facility-administered medications on file prior to encounter.    Current Outpatient Prescriptions on File Prior to Encounter  Medication Sig Dispense Refill  . acetaminophen (TYLENOL) 500 MG tablet Take 500 mg by mouth every 8 (eight)  hours as needed for mild pain, moderate pain or headache.     Marland Kitchen aspirin EC 81 MG tablet Take 81 mg by mouth daily.     . budesonide-formoterol (SYMBICORT) 160-4.5 MCG/ACT inhaler Inhale 2 puffs into the lungs 2 (two) times daily. 3 Inhaler 3  . CRESTOR 10 MG tablet TAKE ONE TABLET BY MOUTH ONCE DAILY 90 tablet 1  . dextromethorphan-guaiFENesin (TUSSIN DM) 10-100 MG/5ML liquid Take 10 mLs by mouth every 4 (four) hours as needed for cough.     . finasteride (PROSCAR) 5 MG tablet Take 5 mg by mouth every morning.     . gabapentin (NEURONTIN) 300 MG capsule TAKE ONE CAPSULE BY MOUTH 4 TIMES DAILY (Patient taking differently: Take 300 mg by mouth in  the morning and 300 mg in the evening (may also takes 2 more times daily as needed for nerve pain)) 120 capsule 3  . glucose blood (ONE TOUCH ULTRA TEST) test strip Use as instructed to check blood sugar 3 times per day dx code E11.65 100 each 5  . HUMALOG MIX 75/25 KWIKPEN (75-25) 100 UNIT/ML Kwikpen INJECT 20 UNITS SUBCUTANEOUSLY IN THE MORNING AND 30 UNITS SUBCUTANEOUSLY IN THE EVENING DAILY (Patient taking differently: Inject 20 units into the skin in the morning then 14 units in the afternoon then 16 units at bedtime) 45 mL 0  . levothyroxine (SYNTHROID, LEVOTHROID) 112 MCG tablet TAKE ONE TABLET BY MOUTH ONCE DAILY (Patient taking differently: Take 112 mcg by mouth once a day) 90 tablet 0  . menthol-thymol (ABSORBINE JR) LIQD Apply 1 application topically as needed. For pain.    . metFORMIN (GLUCOPHAGE-XR) 500 MG 24 hr tablet TAKE THREE TABLETS BY MOUTH ONCE DAILY WITH SUPPER (Patient taking differently: Take 1,000 mg by mouth in the evening) 90 tablet 5  . oxyCODONE-acetaminophen (ROXICET) 5-325 MG tablet Take 1 tablet by mouth every 8 (eight) hours as needed for severe pain. 50 tablet 0  . polyethylene glycol (MIRALAX / GLYCOLAX) packet Take 17 g by mouth daily as needed for mild constipation, moderate constipation or severe constipation. 1 capful daily  as needed    . TOUJEO SOLOSTAR 300 UNIT/ML SOPN INJECT 42 UNITS SUBCUTANEOUSLY EVERY MORNING (Patient taking differently: Inject 32 units into the skin in the morning) 6 mL 3  . triamcinolone (NASACORT AQ) 55 MCG/ACT AERO nasal inhaler Place 1-2 sprays into the nose daily.     . vitamin B-12 (CYANOCOBALAMIN) 1000 MCG tablet Take 1,000 mcg by mouth daily.    . QUEtiapine (SEROQUEL) 25 MG tablet Take 1 tablet (25 mg total) by mouth at bedtime. (Patient not taking: Reported on 11/25/2015) 30 tablet 1    Results for orders placed or performed during the hospital encounter of 11/25/15 (from the past 48 hour(s))  Basic metabolic panel     Status: Abnormal   Collection Time: 11/25/15  6:33 PM  Result Value Ref Range   Sodium 140 135 - 145 mmol/L   Potassium 4.6 3.5 - 5.1 mmol/L   Chloride 113 (H) 101 - 111 mmol/L   CO2 16 (L) 22 - 32 mmol/L   Glucose, Bld 226 (H) 65 - 99 mg/dL   BUN 25 (H) 6 - 20 mg/dL   Creatinine, Ser 1.37 (H) 0.61 - 1.24 mg/dL   Calcium 9.2 8.9 - 10.3 mg/dL   GFR calc non Af Amer 47 (L) >60 mL/min   GFR calc Af Amer 55 (L) >60 mL/min    Comment: (NOTE) The eGFR has been calculated using the CKD EPI equation. This calculation has not been validated in all clinical situations. eGFR's persistently <60 mL/min signify possible Chronic Kidney Disease.    Anion gap 11 5 - 15  CBC     Status: Abnormal   Collection Time: 11/25/15  6:33 PM  Result Value Ref Range   WBC 13.4 (H) 4.0 - 10.5 K/uL   RBC 4.51 4.22 - 5.81 MIL/uL   Hemoglobin 12.4 (L) 13.0 - 17.0 g/dL   HCT 40.1 39.0 - 52.0 %   MCV 88.9 78.0 - 100.0 fL   MCH 27.5 26.0 - 34.0 pg   MCHC 30.9 30.0 - 36.0 g/dL   RDW 15.3 11.5 - 15.5 %   Platelets 179 150 - 400 K/uL  I-stat troponin,  ED     Status: Abnormal   Collection Time: 11/25/15  6:44 PM  Result Value Ref Range   Troponin i, poc 0.22 (HH) 0.00 - 0.08 ng/mL   Comment NOTIFIED PHYSICIAN    Comment 3            Comment: Due to the release kinetics of cTnI, a  negative result within the first hours of the onset of symptoms does not rule out myocardial infarction with certainty. If myocardial infarction is still suspected, repeat the test at appropriate intervals.    Dg Chest Port 1 View  Result Date: 11/25/2015 CLINICAL DATA:  Chest pain, shortness of breath, pain down both arms all day, hypertension, diabetes mellitus, asthma, prior MI, prostate cancer EXAM: PORTABLE CHEST 1 VIEW COMPARISON:  Portable exam 1832 hours compared 10/12/2013 FINDINGS: Enlargement of cardiac silhouette with minimal vascular congestion. Atherosclerotic calcification aorta. Emphysematous changes compatible with COPD. Interstitial changes at the lung bases bilaterally question interstitial infiltrates and edema with probable component of atelectasis as well. Upper lungs clear. No pleural effusion or pneumothorax. Bones demineralized. IMPRESSION: Enlargement of cardiac silhouette with minimal vascular congestion. Aortic atherosclerosis. COPD changes with mild interstitial edema at the lung bases and probable component of atelectasis. Electronically Signed   By: Lavonia Dana M.D.   On: 11/25/2015 18:41    ROS: As above. Otherwise, review of systems is negative unless per above HPI  Vitals:   11/25/15 1930 11/25/15 1945 11/25/15 2000 11/25/15 2015  BP: 147/95 139/90 125/84 143/97  Pulse: 98 94 91 93  Resp: _0 Temp:      TempSrc:      SpO2: 97% 98% 99% 99%  Weight: 85.7 kg (188 lb 15 oz)     Height: _1  (1.854 m)      Wt Readings from Last 10 Encounters:  11/25/15 85.7 kg (188 lb 15 oz)  11/03/15 85.7 kg (189 lb)  08/03/15 85.8 kg (189 lb 3.2 oz)  06/01/15 85.8 kg (189 lb 3.2 oz)  04/21/15 90.4 kg (199 lb 4 oz)  03/10/15 88.3 kg (194 lb 9.6 oz)  02/10/15 89.1 kg (196 lb 6.4 oz)  01/26/15 89 kg (196 lb 3.2 oz)  01/04/15 88.5 kg (195 lb)  11/24/14 86.5 kg (190 lb 12.8 oz)    PE:  General: No acute distress HEENT: Atraumatic, EOMI, mucous membranes  moist CV: RRR grade 2 SEM, clear A2, no gallops. JVD just above clavicle at 45 degrees. No HJR. Respiratory: Clear after clearing throat, initially bibasilar rhonci. Normal work of breathing ABD: Non-distended and non-tender. No palpable organomegaly.  Extremities: 2+ radial pulses bilaterally. No edema. Neuro/Psych: CN grossly intact, alert and oriented  ECG: Sinus with prolonged first degree AVB and LAFB  Assessment/Plan  82 YO male with significant CV risk factors presenting with chest pain and + troponin, likely type I NSTEMI  NSTEMI (non-ST elevated myocardial infarction) (HCC) Acute, favor type I event. Currently chest pain 1/10 in ED on 10 mcg NTG IV-- case discussed with Dr. Irish Lack, interventional on call - S/P ASA 325, ACS heparin - Increase to 15 mcg of IV NTG, if worsening pain not controlled by IV NTG, will activate cath lab overnight - High likelihood of 3V disease-- no upstream P2Y12  - Start 12.5 mg metoprolol BID - Continue home ASA 81 mg qd, Crestor 10 mg qd - TTE, daily ECG, tele  Cough variant asthma - Continue home symbicort  CKD stage 2 -3  - CTM  Iron deficiency anemia - Mild, CTM  PACS with RBBB and LAFB (bifascicular) with very prolonged first degree AVB - CTM telemetry closely, No signs of heart block at this time, but worsening PR interval may be sign of AVN ischemia.  Has some Wenckebach previously noted and he appeared to do this infrequently on exam  Type II diabetes mellitus, uncontrolled ([place]) - Home glargine (32 U in AM) decreased to 15 U while NPO - SSI - hold metformin - Continue neurontin 300 BID  Mitral regurgitation and aortic stenosis -  TTE as above  Pure hypercholesterolemia - Crestor as above  Hypothyroidism - TSH pending, continue home synthroid 112 mcg  Osteoarthritis - Home tylenol and roxicet prn  Prostate cancer ([place]) - No evidence of recurrence on most recent screening imaging.  Continue home finasteride     Lolita Cram  Lareen Mullings  MD 11/25/2015, 8:45 PM

## 2015-11-25 NOTE — ED Triage Notes (Signed)
Per [facility], Pt is coming from home with complaints of generalized chest pain that started three days ago with a productive cough. Pt had light wheezing with EMS, 91% on RA. Vitals per EMS: 183/120,  93 HR, 252 CBG, 98% on 2L. Hx of Bronchitis, COPD.

## 2015-11-26 DIAGNOSIS — Z794 Long term (current) use of insulin: Secondary | ICD-10-CM

## 2015-11-26 DIAGNOSIS — I499 Cardiac arrhythmia, unspecified: Secondary | ICD-10-CM

## 2015-11-26 DIAGNOSIS — N183 Chronic kidney disease, stage 3 (moderate): Secondary | ICD-10-CM

## 2015-11-26 DIAGNOSIS — E1165 Type 2 diabetes mellitus with hyperglycemia: Secondary | ICD-10-CM

## 2015-11-26 DIAGNOSIS — I214 Non-ST elevation (NSTEMI) myocardial infarction: Principal | ICD-10-CM

## 2015-11-26 DIAGNOSIS — E1122 Type 2 diabetes mellitus with diabetic chronic kidney disease: Secondary | ICD-10-CM

## 2015-11-26 LAB — BASIC METABOLIC PANEL
Anion gap: 9 (ref 5–15)
BUN: 24 mg/dL — ABNORMAL HIGH (ref 6–20)
CALCIUM: 9 mg/dL (ref 8.9–10.3)
CHLORIDE: 113 mmol/L — AB (ref 101–111)
CO2: 19 mmol/L — ABNORMAL LOW (ref 22–32)
CREATININE: 1.42 mg/dL — AB (ref 0.61–1.24)
GFR calc non Af Amer: 45 mL/min — ABNORMAL LOW (ref >60)
GFR, EST AFRICAN AMERICAN: 52 mL/min — AB (ref >60)
Glucose, Bld: 210 mg/dL — ABNORMAL HIGH (ref 65–99)
Potassium: 5.4 mmol/L — ABNORMAL HIGH (ref 3.5–5.1)
SODIUM: 141 mmol/L (ref 135–145)

## 2015-11-26 LAB — CBC
HCT: 32.5 % — ABNORMAL LOW (ref 39.0–52.0)
Hemoglobin: 10.1 g/dL — ABNORMAL LOW (ref 13.0–17.0)
MCH: 27.6 pg (ref 26.0–34.0)
MCHC: 31.1 g/dL (ref 30.0–36.0)
MCV: 88.8 fL (ref 78.0–100.0)
Platelets: 135 10*3/uL — ABNORMAL LOW (ref 150–400)
RBC: 3.66 MIL/uL — ABNORMAL LOW (ref 4.22–5.81)
RDW: 15.2 % (ref 11.5–15.5)
WBC: 10.2 10*3/uL (ref 4.0–10.5)

## 2015-11-26 LAB — TROPONIN I
TROPONIN I: 1.62 ng/mL — AB (ref <0.03)
Troponin I: 1.57 ng/mL (ref <0.03)

## 2015-11-26 LAB — GLUCOSE, CAPILLARY
GLUCOSE-CAPILLARY: 99 mg/dL (ref 65–99)
Glucose-Capillary: 129 mg/dL — ABNORMAL HIGH (ref 65–99)
Glucose-Capillary: 164 mg/dL — ABNORMAL HIGH (ref 65–99)
Glucose-Capillary: 148 mg/dL — ABNORMAL HIGH (ref 65–99)

## 2015-11-26 LAB — HEPARIN LEVEL (UNFRACTIONATED)
HEPARIN UNFRACTIONATED: 0.81 [IU]/mL — AB (ref 0.30–0.70)
Heparin Unfractionated: 0.6 IU/mL (ref 0.30–0.70)

## 2015-11-26 LAB — PROTIME-INR
INR: 1.23
PROTHROMBIN TIME: 15.5 s — AB (ref 11.4–15.2)

## 2015-11-26 LAB — MRSA PCR SCREENING: MRSA BY PCR: NEGATIVE

## 2015-11-26 LAB — TSH: TSH: 0.873 u[IU]/mL (ref 0.350–4.500)

## 2015-11-26 MED ORDER — SODIUM CHLORIDE 0.9% FLUSH: 3.0000 mL | INTRAVENOUS | Status: DC | PRN

## 2015-11-26 MED ORDER — SODIUM CHLORIDE 0.9% FLUSH: 3.0000 mL | Freq: Two times a day (BID) | INTRAVENOUS | Status: DC

## 2015-11-26 MED ORDER — ASPIRIN 81 MG PO CHEW
81.0000 mg | CHEWABLE_TABLET | ORAL | Status: AC
  Administered 2015-11-27: 81 mg via ORAL
  Filled 2015-11-26: qty 1

## 2015-11-26 MED ORDER — ORAL CARE MOUTH RINSE
15.0000 mL | Freq: Two times a day (BID) | OROMUCOSAL | Status: DC
  Administered 2015-11-27 – 2015-11-28 (×4): 15 mL via OROMUCOSAL

## 2015-11-26 MED ORDER — SODIUM CHLORIDE 0.9 % IV SOLN
INTRAVENOUS | Status: DC
  Administered 2015-11-26: 75 mL/h via INTRAVENOUS
  Administered 2015-11-27: 01:00:00 via INTRAVENOUS

## 2015-11-26 MED ORDER — SODIUM CHLORIDE 0.9 % IV SOLN: INTRAVENOUS | Status: DC

## 2015-11-26 MED ORDER — SODIUM CHLORIDE 0.9 % IV SOLN: 250.0000 mL | INTRAVENOUS | Status: DC | PRN

## 2015-11-26 NOTE — Progress Notes (Signed)
[facility] for heparin Indication: chest pain/ACS  Allergies  Allergen Reactions  . Penicillins Swelling    Eyes and lips Has patient had a PCN reaction causing immediate rash, facial/tongue/throat swelling, SOB or lightheadedness with hypotension: Yes Has patient had a PCN reaction causing severe rash involving mucus membranes or skin necrosis: No Has patient had a PCN reaction that required hospitalization No Has patient had a PCN reaction occurring within the last 10 years: No If all of the above answers are "NO", then may proceed with Cephalosporin use   . Prednisone Other (See Comments)    Hyperglycemia and AMS    Patient Measurements: Height: 5\' 11"  (180.3 cm) Weight: 189 lb (85.7 kg) IBW/kg (Calculated) : 75.3 Heparin Dosing Weight: 85kg  Vital Signs: Temp: 98.1 F (36.7 C) (09/17 1130) Temp Source: Oral (09/17 1130) BP: 112/75 (09/17 0927) Pulse Rate: 77 (09/17 0927)  Labs:  Recent Labs  11/25/15 1833 11/25/15 2254 11/25/15 2309 11/26/15 0527 11/26/15 0725 11/26/15 1353  HGB 12.4*  --   --  10.1*  --   --   HCT 40.1  --   --  32.5*  --   --   PLT 179  --   --  135*  --   --   LABPROT  --   --   --  15.5*  --   --   INR  --   --   --  1.23  --   --   HEPARINUNFRC  --   --   --  0.81*  --  0.60  CREATININE 1.37*  --  1.42*  --   --   --   TROPONINI  --  0.99*  --   --  1.57*  --     Estimated Creatinine Clearance: 44.2 mL/min (by C-G formula based on SCr of 1.42 mg/dL (H)).  Assessment: 82 y.o. male with admitted with chest pain for heparin diagnosed with NSTEMI that will undergo cardiac cath M, 9/18. Troponins elevated 1.57.  -Hgb 10.1, Plts 135 -1400 HL = 0.60 (at goal) -No signs of bleeding  Goal of Therapy:  Heparin level 0.3-0.7 units/ml Monitor platelets by anticoagulation protocol: Yes   Plan:  -Continue heparin at 1100 units/hr -Daily HL, CBC -Monitor S/Sx of bleeding    Myer Peer Grayland Ormond), PharmD   PGY1 Pharmacy Resident Pager: [phone] 11/26/2015 2:29 PM

## 2015-11-26 NOTE — Progress Notes (Signed)
Patient Name: Barry KERKSTRA Sr. Date of Encounter: 11/26/2015  Primary Cardiologist: Dr. Debbe Odea Problem List     Principal Problem:   NSTEMI (non-ST elevated myocardial infarction) [facility]) Active Problems:   Type II diabetes mellitus, uncontrolled (HCC)   Mitral regurgitation and aortic stenosis   Pure hypercholesterolemia   Hypothyroidism   Irregular heart beat   Iron deficiency anemia   Cough variant asthma   CKD (chronic kidney disease), stage III     Subjective   He feels better this morning. Stated that yesterday chest pain was the worst pain he has ever felt. No shortness of breath. No signs of bleeding. Mild headache with nitroglycerin.  Inpatient Medications    . aspirin EC  81 mg Oral Daily  . finasteride  5 mg Oral Daily  . gabapentin  300 mg Oral BID  . insulin aspart  0-15 Units Subcutaneous TID WC  . insulin glargine  15 Units Subcutaneous Daily  . levothyroxine  112 mcg Oral QAC breakfast  . mometasone-formoterol  2 puff Inhalation BID  . rosuvastatin  10 mg Oral Daily   Scheduled Meds: . aspirin EC  81 mg Oral Daily  . finasteride  5 mg Oral Daily  . gabapentin  300 mg Oral BID  . insulin aspart  0-15 Units Subcutaneous TID WC  . insulin glargine  15 Units Subcutaneous Daily  . levothyroxine  112 mcg Oral QAC breakfast  . mometasone-formoterol  2 puff Inhalation BID  . rosuvastatin  10 mg Oral Daily   Continuous Infusions: . heparin 1,100 Units/hr (11/26/15 0636)  . nitroGLYCERIN 60 mcg/min (11/26/15 0705)   PRN Meds:.acetaminophen, guaiFENesin-dextromethorphan, ondansetron (ZOFRAN) IV, oxyCODONE-acetaminophen, polyethylene glycol, polyvinyl alcohol   Vital Signs    Vitals:   11/26/15 0006 11/26/15 0549 11/26/15 0730 11/26/15 0927  BP: 113/88 91/79  112/75  Pulse:  68  77  Resp:  19  16  Temp:  98 F (36.7 C) 97.9 F (36.6 C)   TempSrc:   Oral   SpO2: 99% 98%  99%  Weight:  189 lb (85.7 kg)    Height:  5\' 11"  (1.803 m)       Intake/Output Summary (Last 24 hours) at 11/26/15 0932 Last data filed at 11/26/15 0602  Gross per 24 hour  Intake           198.48 ml  Output              250 ml  Net           -51.52 ml   Filed Weights   11/25/15 1930 11/26/15 0549  Weight: 188 lb 15 oz (85.7 kg) 189 lb (85.7 kg)    Physical Exam    GEN: Well nourished, well developed, in no acute distress.  HEENT: Grossly normal.  Neck: Supple, no JVD, carotid bruits, or masses. Cardiac: RRR, 2/6 systolic ejection murmur right upper sternal border, no rubs, or gallops. No clubbing, cyanosis, edema.  Radials/DP/PT 2+ and equal bilaterally.  Respiratory:  Respirations regular and unlabored, clear to auscultation bilaterally. GI: Soft, nontender, nondistended, BS + x 4. Overweight MS: no deformity or atrophy. Skin: warm and dry, no rash. Neuro:  Strength and sensation are intact. Psych: AAOx3.  Normal affect.  Labs    CBC  Recent Labs  11/25/15 1833 11/26/15 0527  WBC 13.4* 10.2  HGB 12.4* 10.1*  HCT 40.1 32.5*  MCV 88.9 88.8  PLT 179 [id number]*   Basic Metabolic Panel  Recent  Labs  11/25/15 1833 11/25/15 2309  NA 140 141  K 4.6 5.4*  CL 113* 113*  CO2 16* 19*  GLUCOSE 226* 210*  BUN 25* 24*  CREATININE 1.37* 1.42*  CALCIUM 9.2 9.0   Liver Function Tests No results for input(s): AST, ALT, ALKPHOS, BILITOT, PROT, ALBUMIN in the last 72 hours. No results for input(s): LIPASE, AMYLASE in the last 72 hours. Cardiac Enzymes  Recent Labs  11/25/15 2254 11/26/15 0725  TROPONINI 0.99* 1.57*   BNP Invalid input(s): POCBNP D-Dimer No results for input(s): DDIMER in the last 72 hours. Hemoglobin A1C No results for input(s): HGBA1C in the last 72 hours. Fasting Lipid Panel No results for input(s): CHOL, HDL, LDLCALC, TRIG, CHOLHDL, LDLDIRECT in the last 72 hours. Thyroid Function Tests  Recent Labs  11/26/15 0527  TSH 0.873    Telemetry    No adverse arrhythmias, personally viewed  ECG     11/26/15-significant Mobitz type I, right bundle branch block, left anterior fascicular block, bifascicular block, poor R-wave progression, personally viewed  Radiology    Dg Chest Port 1 View  Result Date: 11/25/2015 CLINICAL DATA:  Chest pain, shortness of breath, pain down both arms all day, hypertension, diabetes mellitus, asthma, prior MI, prostate cancer EXAM: PORTABLE CHEST 1 VIEW COMPARISON:  Portable exam 1832 hours compared 10/12/2013 FINDINGS: Enlargement of cardiac silhouette with minimal vascular congestion. Atherosclerotic calcification aorta. Emphysematous changes compatible with COPD. Interstitial changes at the lung bases bilaterally question interstitial infiltrates and edema with probable component of atelectasis as well. Upper lungs clear. No pleural effusion or pneumothorax. Bones demineralized. IMPRESSION: Enlargement of cardiac silhouette with minimal vascular congestion. Aortic atherosclerosis. COPD changes with mild interstitial edema at the lung bases and probable component of atelectasis. Electronically Signed   By: Lavonia Dana M.D.   On: 11/25/2015 18:41   Echo 12/11/12 Study Conclusions  - Left ventricle: The cavity size was normal. Wall thickness was increased in a pattern of moderate LVH. Systolic function was normal. The estimated ejection fraction was in the range of 55% to 65%. Wall motion was normal; there were no regional wall motion abnormalities. Doppler parameters are consistent with abnormal left ventricular relaxation (grade 1 diastolic dysfunction). - Aortic valve: There was mild stenosis. - Mitral valve: Calcified annulus. Mildly thickened leaflets . Mild regurgitation. - Left atrium: The atrium was mildly dilated.  ETT: 09/14/14 Max HR 117 no ischemia or high grade HB  Inconclusive Lexiscan 2014   Patient Profile     82 year old male with non-ST elevation myocardial infarction, insulin-dependent diabetes, hyperlipidemia, hypertension,  hypothyroidism, strong family history of CAD, mild to moderate aortic stenosis, untreated obstructive sleep apnea, chronic right bundle branch block, first-degree AV block, left anterior fascicular block.  Assessment & Plan    Non-ST elevation myocardial infarction  - Cardiac catheterization Monday 11/27/15. Risks and benefits of procedure including stroke, heart attack, renal impairment, bleeding, death have been described, willing to proceed.  - Continuing with aspirin, no beta blocker because of significant Mobitz type I conduction disease, continue statin, IV heparin, IV nitroglycerin  - Troponin increased to 1.57.  - Having a headache with nitroglycerin at 60. Try to decrease to 30. Tylenol fine.  - Echocardiogram pending.  Bifascicular block, Mobitz type I  - Significant conduction abnormality seen on EKG, avoiding Beta blockers. No syncope.  Chronic kidney disease stage III/diabetic nephropathy  - Creatinine ranging from 1.15-1.42. Last value 1.42  - Hydrate prior to cardiac catheterization, 75 mL per hour.  Hyperlipidemia  -  LDL 53. Continue Crestor.  Mild anemia  - 12.4 ->10.1, platelets 135 down from 179. Continue to monitor. No active signs of bleeding.  Diabetes with nephropathy  - Insulin.  - Dr. Dwyane Dee  Signed, Candee Furbish, MD  11/26/2015, 9:32 AM

## 2015-11-26 NOTE — Progress Notes (Signed)
[place] for heparin Indication: chest pain/ACS  Allergies  Allergen Reactions  . Penicillins Swelling    Eyes and lips Has patient had a PCN reaction causing immediate rash, facial/tongue/throat swelling, SOB or lightheadedness with hypotension: Yes Has patient had a PCN reaction causing severe rash involving mucus membranes or skin necrosis: No Has patient had a PCN reaction that required hospitalization No Has patient had a PCN reaction occurring within the last 10 years: No If all of the above answers are "NO", then may proceed with Cephalosporin use   . Prednisone Other (See Comments)    Hyperglycemia and AMS    Patient Measurements: Height: 5\' 11"  (180.3 cm) Weight: 189 lb (85.7 kg) IBW/kg (Calculated) : 75.3 Heparin Dosing Weight: 85kg  Vital Signs: Temp: 98 F (36.7 C) (09/17 0549) Temp Source: Oral (09/16 2255) BP: 91/79 (09/17 0549) Pulse Rate: 68 (09/17 0549)  Labs:  Recent Labs  11/25/15 1833 11/25/15 2254 11/25/15 2309 11/26/15 0527  HGB 12.4*  --   --  10.1*  HCT 40.1  --   --  32.5*  PLT 179  --   --  135*  LABPROT  --   --   --  15.5*  INR  --   --   --  1.23  HEPARINUNFRC  --   --   --  0.81*  CREATININE 1.37*  --  1.42*  --   TROPONINI  --  0.99*  --   --     Estimated Creatinine Clearance: 44.2 mL/min (by C-G formula based on SCr of 1.42 mg/dL (H)).  Assessment: 82 y.o. male with chest pain for heparin   Goal of Therapy:  Heparin level 0.3-0.7 units/ml Monitor platelets by anticoagulation protocol: Yes   Plan:  Decrease Heparin 1100 units/hr Check heparin level in 8 hours.   Phillis Knack, PharmD, BCPS  11/26/2015 6:31 AM

## 2015-11-26 NOTE — Significant Event (Signed)
0700 AM update to H&P Evaluated patient several times overnight, chest pain very much improved with IV NTG. Dose Titrated to ~60 mcg.  Has some headache improved with tylenol.  Headache more bothersome than minimal chest pain at this point.  Well appearing on interview, no return of significant pain that prompted presentation. There were some issues re-drawing the troponin, currently one in lab.  NPO for LHC.

## 2015-11-27 ENCOUNTER — Inpatient Hospital Stay (HOSPITAL_COMMUNITY): Payer: Medicare Other

## 2015-11-27 ENCOUNTER — Encounter (HOSPITAL_COMMUNITY)
Admission: EM | Disposition: A | Discharge status: Home Health | Payer: Self-pay | Source: Home / Self Care | Attending: Cardiothoracic Surgery

## 2015-11-27 ENCOUNTER — Other Ambulatory Visit (HOSPITAL_COMMUNITY): Payer: Medicare Other

## 2015-11-27 ENCOUNTER — Encounter (HOSPITAL_COMMUNITY): Payer: Self-pay | Admitting: Cardiology

## 2015-11-27 ENCOUNTER — Encounter (HOSPITAL_COMMUNITY): Payer: Medicare Other

## 2015-11-27 DIAGNOSIS — I25110 Atherosclerotic heart disease of native coronary artery with unstable angina pectoris: Secondary | ICD-10-CM

## 2015-11-27 DIAGNOSIS — I2510 Atherosclerotic heart disease of native coronary artery without angina pectoris: Secondary | ICD-10-CM

## 2015-11-27 DIAGNOSIS — I440 Atrioventricular block, first degree: Secondary | ICD-10-CM

## 2015-11-27 DIAGNOSIS — D649 Anemia, unspecified: Secondary | ICD-10-CM

## 2015-11-27 DIAGNOSIS — E785 Hyperlipidemia, unspecified: Secondary | ICD-10-CM

## 2015-11-27 DIAGNOSIS — Z01810 Encounter for preprocedural cardiovascular examination: Secondary | ICD-10-CM

## 2015-11-27 HISTORY — PX: CARDIAC CATHETERIZATION: SHX172

## 2015-11-27 LAB — BASIC METABOLIC PANEL
ANION GAP: 8 (ref 5–15)
BUN: 16 mg/dL (ref 6–20)
CALCIUM: 8.2 mg/dL — AB (ref 8.9–10.3)
CO2: 21 mmol/L — AB (ref 22–32)
Chloride: 112 mmol/L — ABNORMAL HIGH (ref 101–111)
Creatinine, Ser: 1.14 mg/dL (ref 0.61–1.24)
GFR calc Af Amer: >60 mL/min (ref >60)
GFR calc non Af Amer: 59 mL/min — ABNORMAL LOW (ref >60)
GLUCOSE: 102 mg/dL — AB (ref 65–99)
Potassium: 4.2 mmol/L (ref 3.5–5.1)
Sodium: 141 mmol/L (ref 135–145)

## 2015-11-27 LAB — GLUCOSE, CAPILLARY
GLUCOSE-CAPILLARY: 191 mg/dL — AB (ref 65–99)
Glucose-Capillary: 104 mg/dL — ABNORMAL HIGH (ref 65–99)
Glucose-Capillary: 112 mg/dL — ABNORMAL HIGH (ref 65–99)
Glucose-Capillary: 115 mg/dL — ABNORMAL HIGH (ref 65–99)
Glucose-Capillary: 135 mg/dL — ABNORMAL HIGH (ref 65–99)
Glucose-Capillary: 160 mg/dL — ABNORMAL HIGH (ref 65–99)

## 2015-11-27 LAB — CBC
HCT: 31.8 % — ABNORMAL LOW (ref 39.0–52.0)
HEMOGLOBIN: 9.6 g/dL — AB (ref 13.0–17.0)
MCH: 27 pg (ref 26.0–34.0)
MCHC: 30.2 g/dL (ref 30.0–36.0)
MCV: 89.3 fL (ref 78.0–100.0)
PLATELETS: 124 10*3/uL — AB (ref 150–400)
RBC: 3.56 MIL/uL — AB (ref 4.22–5.81)
RDW: 15.2 % (ref 11.5–15.5)
WBC: 9 10*3/uL (ref 4.0–10.5)

## 2015-11-27 LAB — HEPARIN LEVEL (UNFRACTIONATED): HEPARIN UNFRACTIONATED: 0.52 [IU]/mL (ref 0.30–0.70)

## 2015-11-27 LAB — MRSA PCR SCREENING: MRSA by PCR: NEGATIVE

## 2015-11-27 SURGERY — LEFT HEART CATH AND CORONARY ANGIOGRAPHY
Anesthesia: LOCAL

## 2015-11-27 MED ORDER — SODIUM CHLORIDE 0.9 % WEIGHT BASED INFUSION
1.0000 mL/kg/h | INTRAVENOUS | Status: AC
  Administered 2015-11-27: 1 mL/kg/h via INTRAVENOUS

## 2015-11-27 MED ORDER — IOPAMIDOL (ISOVUE-370) INJECTION 76%
INTRAVENOUS | Status: AC
  Filled 2015-11-27: qty 100

## 2015-11-27 MED ORDER — MIDAZOLAM HCL 2 MG/2ML IJ SOLN
INTRAMUSCULAR | Status: AC
  Filled 2015-11-27: qty 2

## 2015-11-27 MED ORDER — HEPARIN (PORCINE) IN NACL 100-0.45 UNIT/ML-% IJ SOLN
1350.0000 [IU]/h | INTRAMUSCULAR | Status: DC
  Administered 2015-11-27 (×2): 1100 [IU]/h via INTRAVENOUS
  Administered 2015-11-28: 1350 [IU]/h via INTRAVENOUS
  Filled 2015-11-27 (×2): qty 250

## 2015-11-27 MED ORDER — LIDOCAINE HCL (PF) 1 % IJ SOLN
INTRAMUSCULAR | Status: AC
  Filled 2015-11-27: qty 30

## 2015-11-27 MED ORDER — HEPARIN (PORCINE) IN NACL 2-0.9 UNIT/ML-% IJ SOLN
INTRAMUSCULAR | Status: DC | PRN
  Administered 2015-11-27 (×2): 1000 mL

## 2015-11-27 MED ORDER — NITROGLYCERIN 0.3 MG SL SUBL
SUBLINGUAL_TABLET | SUBLINGUAL | Status: DC | PRN
  Administered 2015-11-27: 0.4 mg via SUBLINGUAL

## 2015-11-27 MED ORDER — LIDOCAINE HCL (PF) 1 % IJ SOLN
INTRAMUSCULAR | Status: DC | PRN
  Administered 2015-11-27: 2 mL

## 2015-11-27 MED ORDER — VERAPAMIL HCL 2.5 MG/ML IV SOLN
INTRAVENOUS | Status: DC | PRN
  Administered 2015-11-27: 10 mL via INTRA_ARTERIAL

## 2015-11-27 MED ORDER — FENTANYL CITRATE (PF) 100 MCG/2ML IJ SOLN
INTRAMUSCULAR | Status: DC | PRN
  Administered 2015-11-27: 25 ug via INTRAVENOUS

## 2015-11-27 MED ORDER — HEPARIN SODIUM (PORCINE) 1000 UNIT/ML IJ SOLN
INTRAMUSCULAR | Status: AC
  Filled 2015-11-27: qty 1

## 2015-11-27 MED ORDER — HEPARIN SODIUM (PORCINE) 1000 UNIT/ML IJ SOLN
INTRAMUSCULAR | Status: DC | PRN
  Administered 2015-11-27: 4300 [IU] via INTRAVENOUS

## 2015-11-27 MED ORDER — NITROGLYCERIN 0.4 MG SL SUBL
SUBLINGUAL_TABLET | SUBLINGUAL | Status: AC
  Filled 2015-11-27: qty 1

## 2015-11-27 MED ORDER — SODIUM CHLORIDE 0.9 % IV SOLN: 250.0000 mL | INTRAVENOUS | Status: DC | PRN

## 2015-11-27 MED ORDER — IOPAMIDOL (ISOVUE-370) INJECTION 76%
INTRAVENOUS | Status: DC | PRN
  Administered 2015-11-27: 80 mL via INTRA_ARTERIAL

## 2015-11-27 MED ORDER — HEPARIN (PORCINE) IN NACL 2-0.9 UNIT/ML-% IJ SOLN
INTRAMUSCULAR | Status: AC
  Filled 2015-11-27: qty 1000

## 2015-11-27 MED ORDER — VERAPAMIL HCL 2.5 MG/ML IV SOLN
INTRAVENOUS | Status: AC
  Filled 2015-11-27: qty 2

## 2015-11-27 MED ORDER — FENTANYL CITRATE (PF) 100 MCG/2ML IJ SOLN
INTRAMUSCULAR | Status: AC
  Filled 2015-11-27: qty 2

## 2015-11-27 MED ORDER — MIDAZOLAM HCL 2 MG/2ML IJ SOLN
INTRAMUSCULAR | Status: DC | PRN
  Administered 2015-11-27: 1 mg via INTRAVENOUS

## 2015-11-27 MED ORDER — SODIUM CHLORIDE 0.9% FLUSH: 3.0000 mL | INTRAVENOUS | Status: DC | PRN

## 2015-11-27 MED ORDER — SODIUM CHLORIDE 0.9% FLUSH
3.0000 mL | Freq: Two times a day (BID) | INTRAVENOUS | Status: DC
  Administered 2015-11-27 – 2015-11-28 (×4): 3 mL via INTRAVENOUS

## 2015-11-27 SURGICAL SUPPLY — 12 items
CATH INFINITI 5 FR JL3.5 (CATHETERS) ×2 IMPLANT
CATH INFINITI 5FR AL1 (CATHETERS) ×2 IMPLANT
CATH INFINITI JR4 5F (CATHETERS) ×2 IMPLANT
DEVICE RAD COMP TR BAND LRG (VASCULAR PRODUCTS) ×2 IMPLANT
GLIDESHEATH SLEND SS 6F .021 (SHEATH) ×2 IMPLANT
KIT HEART LEFT (KITS) ×2 IMPLANT
PACK CARDIAC CATHETERIZATION (CUSTOM PROCEDURE TRAY) ×2 IMPLANT
TRANSDUCER W/STOPCOCK (MISCELLANEOUS) ×2 IMPLANT
TUBING CIL FLEX 10 FLL-RA (TUBING) ×2 IMPLANT
WIRE EMERALD ST .035X150CM (WIRE) ×2 IMPLANT
WIRE HI TORQ VERSACORE-J 145CM (WIRE) ×2 IMPLANT
WIRE SAFE-T 1.5MM-J .035X260CM (WIRE) ×2 IMPLANT

## 2015-11-27 NOTE — H&P (View-Only) (Signed)
Patient Name: Barry BAACK Sr. Date of Encounter: 11/27/2015  Primary Cardiologist: Dr. Debbe Odea Problem List     Principal Problem:   NSTEMI (non-ST elevated myocardial infarction) [facility]) Active Problems:   Type II diabetes mellitus, uncontrolled (HCC)   Mitral regurgitation and aortic stenosis   Pure hypercholesterolemia   Hypothyroidism   Irregular heart beat   Iron deficiency anemia   Cough variant asthma   CKD (chronic kidney disease), stage III     Subjective   No pain wants to get cath done   Inpatient Medications    . aspirin EC  81 mg Oral Daily  . finasteride  5 mg Oral Daily  . gabapentin  300 mg Oral BID  . insulin aspart  0-15 Units Subcutaneous TID WC  . insulin glargine  15 Units Subcutaneous Daily  . levothyroxine  112 mcg Oral QAC breakfast  . mouth rinse  15 mL Mouth Rinse BID  . mometasone-formoterol  2 puff Inhalation BID  . rosuvastatin  10 mg Oral Daily  . sodium chloride flush  3 mL Intravenous Q12H   Scheduled Meds: . aspirin EC  81 mg Oral Daily  . finasteride  5 mg Oral Daily  . gabapentin  300 mg Oral BID  . insulin aspart  0-15 Units Subcutaneous TID WC  . insulin glargine  15 Units Subcutaneous Daily  . levothyroxine  112 mcg Oral QAC breakfast  . mouth rinse  15 mL Mouth Rinse BID  . mometasone-formoterol  2 puff Inhalation BID  . rosuvastatin  10 mg Oral Daily  . sodium chloride flush  3 mL Intravenous Q12H   Continuous Infusions: . sodium chloride 75 mL/hr at 11/27/15 0800  . sodium chloride    . heparin 1,100 Units/hr (11/27/15 0800)  . nitroGLYCERIN 60 mcg/min (11/27/15 0800)   PRN Meds:.sodium chloride, acetaminophen, guaiFENesin-dextromethorphan, ondansetron (ZOFRAN) IV, oxyCODONE-acetaminophen, polyethylene glycol, polyvinyl alcohol, sodium chloride flush   Vital Signs    Vitals:   11/27/15 0137 11/27/15 0539 11/27/15 0740 11/27/15 0743  BP: (!) 160/101 125/89  106/86  Pulse:      Resp:  16  18  Temp:   98.5 F (36.9 C)  98.3 F (36.8 C)  TempSrc:  Oral  Oral  SpO2: 97% 100% 99% 98%  Weight:      Height:        Intake/Output Summary (Last 24 hours) at 11/27/15 0839 Last data filed at 11/27/15 0400  Gross per 24 hour  Intake           1276.1 ml  Output              250 ml  Net           1026.1 ml   Filed Weights   11/25/15 1930 11/26/15 0549  Weight: 85.7 kg (188 lb 15 oz) 85.7 kg (189 lb)    Physical Exam    GEN: Well nourished, well developed, in no acute distress.  HEENT: Grossly normal.  Neck: Supple, no JVD, carotid bruits, or masses. Cardiac: RRR, 2/6 systolic ejection murmur right upper sternal border, no rubs, or gallops. No clubbing, cyanosis, edema.  Radials/DP/PT 2+ and equal bilaterally.  Respiratory:  Respirations regular and unlabored, clear to auscultation bilaterally. GI: Soft, nontender, nondistended, BS + x 4. Overweight MS: no deformity or atrophy. Skin: warm and dry, no rash. Neuro:  Strength and sensation are intact. Psych: AAOx3.  Normal affect.  Labs    CBC  Recent  Labs  11/26/15 0527 11/27/15 0537  WBC 10.2 9.0  HGB 10.1* 9.6*  HCT 32.5* 31.8*  MCV 88.8 89.3  PLT 135* [id number]*   Basic Metabolic Panel  Recent Labs  11/25/15 1833 11/25/15 2309  NA 140 141  K 4.6 5.4*  CL 113* 113*  CO2 16* 19*  GLUCOSE 226* 210*  BUN 25* 24*  CREATININE 1.37* 1.42*  CALCIUM 9.2 9.0   Liver Function Tests No results for input(s): AST, ALT, ALKPHOS, BILITOT, PROT, ALBUMIN in the last 72 hours. No results for input(s): LIPASE, AMYLASE in the last 72 hours. Cardiac Enzymes  Recent Labs  11/25/15 2254 11/26/15 0725 11/26/15 1353  TROPONINI 0.99* 1.57* 1.62*   BNP Invalid input(s): POCBNP D-Dimer No results for input(s): DDIMER in the last 72 hours. Hemoglobin A1C No results for input(s): HGBA1C in the last 72 hours. Fasting Lipid Panel No results for input(s): CHOL, HDL, LDLCALC, TRIG, CHOLHDL, LDLDIRECT in the last 72 hours. Thyroid  Function Tests  Recent Labs  11/26/15 0527  TSH 0.873    Telemetry    No adverse arrhythmias, personally viewed  ECG    11/26/15-significant Mobitz type I, right bundle branch block, left anterior fascicular block, bifascicular block, poor R-wave progression, personally viewed  Radiology    Dg Chest Port 1 View  Result Date: 11/25/2015 CLINICAL DATA:  Chest pain, shortness of breath, pain down both arms all day, hypertension, diabetes mellitus, asthma, prior MI, prostate cancer EXAM: PORTABLE CHEST 1 VIEW COMPARISON:  Portable exam 1832 hours compared 10/12/2013 FINDINGS: Enlargement of cardiac silhouette with minimal vascular congestion. Atherosclerotic calcification aorta. Emphysematous changes compatible with COPD. Interstitial changes at the lung bases bilaterally question interstitial infiltrates and edema with probable component of atelectasis as well. Upper lungs clear. No pleural effusion or pneumothorax. Bones demineralized. IMPRESSION: Enlargement of cardiac silhouette with minimal vascular congestion. Aortic atherosclerosis. COPD changes with mild interstitial edema at the lung bases and probable component of atelectasis. Electronically Signed   By: Lavonia Dana M.D.   On: 11/25/2015 18:41   Echo 12/11/12 Study Conclusions  - Left ventricle: The cavity size was normal. Wall thickness was increased in a pattern of moderate LVH. Systolic function was normal. The estimated ejection fraction was in the range of 55% to 65%. Wall motion was normal; there were no regional wall motion abnormalities. Doppler parameters are consistent with abnormal left ventricular relaxation (grade 1 diastolic dysfunction). - Aortic valve: There was mild stenosis. - Mitral valve: Calcified annulus. Mildly thickened leaflets . Mild regurgitation. - Left atrium: The atrium was mildly dilated.  ETT: 09/14/14 Max HR 117 no ischemia or high grade HB  Inconclusive Lexiscan 2014   Patient Profile       82 year old male with non-ST elevation myocardial infarction, insulin-dependent diabetes, hyperlipidemia, hypertension, hypothyroidism, strong family history of CAD, mild to moderate aortic stenosis, untreated obstructive sleep apnea, chronic right bundle branch block, first-degree AV block, left anterior fascicular block.  Assessment & Plan    Non-ST elevation myocardial infarction  - Cardiac catheterization Monday 11/27/15. Risks and benefits of procedure including stroke, heart attack, renal impairment, bleeding, death have been described, willing to proceed.  - Continuing with aspirin, no beta blocker because of significant Mobitz type I conduction disease, continue statin, IV heparin, IV nitroglycerin  - Troponin increased to 1.57.  Bifascicular block, Mobitz type I  - Significant conduction abnormality seen on EKG, avoiding Beta blockers. No syncope. This am telemetry with 3:2 wenkebach   Chronic kidney disease stage  III/diabetic nephropathy  - Creatinine ranging from 1.15-1.42. Last value 1.42  - Hydrate prior to cardiac catheterization, 75 mL per hour.  Hyperlipidemia  - LDL 53. Continue Crestor.  Mild anemia  - 12.4 ->10.1, platelets 135 down from 179. Continue to monitor. No active signs of bleeding.  Diabetes with nephropathy  - Insulin.  - Dr. Dwyane Dee  Signed, Jenkins Rouge, MD  11/27/2015, 8:39 AM

## 2015-11-27 NOTE — Progress Notes (Signed)
Pre-op Cardiac Surgery  Carotid Findings:  R:40-59% ICA stenosis, highest end of scale. L:1-39% ICA stenosis.  Vertebral artery flow is antegrade.   Upper Extremity Right Left  Brachial Pressures Restricted   T 101 T  Radial Waveforms T T  Ulnar Waveforms T T  Palmar Arch (Allen's Test) Doppler signal remains normal with radial compression and obliterates with ulnar compression WNL   Findings:      Lower  Extremity Right Left  Dorsalis Pedis    Anterior Tibial 129T 131T  Posterior Tibial 167T 141T  Ankle/Brachial Indices >1 >1    Findings:  WNL

## 2015-11-27 NOTE — Consult Note (Signed)
[address]       [place] [address]             [phone]        [name] [place] [facility] Record [id number] Date of Birth: 03/07/1933  Referring: No ref. provider found Primary Care: [name], MD  Chief Complaint:    Chief Complaint  Patient presents with  . Chest Pain    History of Present Illness:    Barry Mcdaniel is an 82 year old male we are asked to see in cardiothoracic surgical consultation for consideration of CABG. He underwent cardiac catheterization today and was found to have severe multivessel coronary artery disease. Please see the report below.  The patient presented to the emergency department 2 days ago with chest pain. He has a hx pf "mild heart attack " 10 years ago  And he does he does have multiple risk factors including diabetes mellitus type 2, hyperlipidemia, hypertension and a strong family history. He also has mild to moderate aortic stenosis, untreated obstructive sleep apnea and chronic right bundle branch block with first-degree AV block/left anterior fascicular block. He presented by EMS to the emergency department. He developed an episode of 10 over 10 substernal chest pain with acute diaphoresis. He was given supplemental nitroglycerin and aspirin and symptoms improved. It is noted that he has been experiencing increasing anginal symptoms over the past approximate 2 months. These symptoms were primarily with exertion. He was admitted for further evaluation and treatment to include medical stabilization and diagnostic workup.-    Current Activity/ Functional Status: Patient is independent with mobility/ambulation, transfers, ADL's, IADL's.   Zubrod Score: At the time of surgery this patient's most appropriate activity status/level should be described as: []     0    Normal activity, no symptoms []     1    Restricted in physical strenuous activity but ambulatory, able to do out light work [x]     2    Ambulatory and  capable of self care, unable to do work activities, up and about                 more than 50%  Of the time                            []     3    Only limited self care, in bed greater than 50% of waking hours []     4    Completely disabled, no self care, confined to bed or chair []     5    Moribund  Past Medical History:  Diagnosis Date  . Anemia    hx of year ago no problems now per pt   . Arthritis    generalized   . Asthma   . Atypical chest pain   . Bladder neck contracture   . Chronic back pain   . Complication of anesthesia    POST OP HYPOTENSION  . Diverticulosis of colon   . First degree heart block   . GERD (gastroesophageal reflux disease)   . Heart murmur   . History of basal cell carcinoma excision    forehead and x6 area forearms  . History of Bell's palsy    x2  1998 &  2004 left side --  residual slight left eye droop and vace  . History of chronic bronchitis   . History of colon polyps   .  History of gout   . History of hypertension   . History of MI (myocardial infarction)    08/ 2010  secondary acute renal failure/ dehydration--  medical management  . History of pulmonary embolus (PE)    2010  . Hypogonadism male   . Hypotension   . Hypothyroidism   . OSA (obstructive sleep apnea)    severe per study 2007/  non-compliant cpap  . Peripheral vascular disease ([place])   . Prostate carcinoma [facility]) urologist-  dr Gaynelle Arabian   DX 2007  Gleason 6  and recurrent 2012  s/p  cryoablation of prostate both times  . RBBB (right bundle branch block)   . Recovering alcoholic in remission ([place])   . Renal calculus, bilateral   . Type 2 diabetes mellitus ([place])     Past Surgical History:  Procedure Laterality Date  . CARDIAC CATHETERIZATION N/A 11/27/2015   Procedure: Left Heart Cath and Coronary Angiography;  Surgeon: Peter M [name], MD;  Location: [facility];  Service: Cardiovascular;  Laterality: N/A;  . CARDIOVASCULAR STRESS TEST  05-19-2012  dr Tressia Miners turner    normal perfusionstudy/  no ischemia/  attenuation artifact inferoapex region of myocardium/  note gate secondary to rhythm irregularity  . CATARACT EXTRACTION W/ INTRAOCULAR LENS IMPLANT Right 2014  . COLONOSCOPY W/ POLYPECTOMY  last one 08-10-2013  . CYSTOSCOPY WITH URETHRAL DILATATION N/A 01/14/2014   Procedure: URETHRAL MEATAL DILATATION;  Surgeon: Ailene Rud, MD;  Location: [facility];  Service: Urology;  Laterality: N/A;  . EXCISION BENIGN SKIN LESION OF BACK  10/ 2011  . HIATAL HERNIA REPAIR  1983  . INGUINAL HERNIA REPAIR Right 10-14-2001  . KNEE ARTHROSCOPY W/ MENISCECTOMY Right 01-25-2003   and chondroplasty  . [place]  . PROSTATE CRYOABLATION  09-17-2010  &  04-12-2005  . REPAIR RIGHT ARM TENDON INJURY  1975  . SHOULDER OPEN ROTATOR CUFF REPAIR  03/20/2011   Procedure: ROTATOR CUFF REPAIR SHOULDER OPEN;  Surgeon: Tobi Bastos;  Location: [facility];  Service: Orthopedics;  Laterality: Right;  . TONSILLECTOMY  as child  . TRANSTHORACIC ECHOCARDIOGRAM  01-11-2013  dr [name]   moderate LVH/  grade I diastolic dysfunction/ ef [id number]  mild AV stenosis /  mild MR/  mild LAE  . TRANSURETHRAL RESECTION OF BLADDER TUMOR N/A 01/14/2014   Procedure: TRANSURETHRAL INCISION OF BLADDER NECK CONTRACTURE;  Surgeon: Ailene Rud, MD;  Location: [facility];  Service: Urology;  Laterality: N/A;  . Vantus Implant     Hormones for Prostate  . VENTRAL HERNIA REPAIR  1998    History  Smoking Status  . Never Smoker  Smokeless Tobacco  . Never Used    History  Alcohol Use No    Comment: RECOVERING ALCOHOLIC none since [id number]     Social History   Social History  . Marital status: Married    Spouse name: N/A  . Number of children: N/A  . Years of education: N/A   Occupational History  . Retired      Personal assistant    Social History Main Topics  . Smoking status: Never Smoker  . Smokeless tobacco: Never Used  . Alcohol  use No     Comment: RECOVERING ALCOHOLIC none since [id number]   . Drug use: No  . Sexual activity: Not on file      Allergies  Allergen Reactions  . Penicillins Swelling    Eyes and  lips Has patient had a PCN reaction causing immediate rash, facial/tongue/throat swelling, SOB or lightheadedness with hypotension: Yes Has patient had a PCN reaction causing severe rash involving mucus membranes or skin necrosis: No Has patient had a PCN reaction that required hospitalization No Has patient had a PCN reaction occurring within the last 10 years: No If all of the above answers are "NO", then may proceed with Cephalosporin use   . Prednisone Other (See Comments)    Hyperglycemia and AMS    Current Facility-Administered Medications  Medication Dose Route Frequency Provider Last Rate Last Dose  . 0.9 %  sodium chloride infusion  250 mL Intravenous PRN Peter M [name], MD      . 0.9% sodium chloride infusion  1 mL/kg/hr Intravenous Continuous Peter M [name], MD 85.7 mL/hr at 11/27/15 1400 1 mL/kg/hr at 11/27/15 1400  . acetaminophen (TYLENOL) tablet 500 mg  500 mg Oral Q8H PRN Lolita Cram Means, MD   500 mg at 11/26/15 2342  . aspirin EC tablet 81 mg  81 mg Oral Daily Lolita Cram Means, MD   81 mg at 11/27/15 0926  . finasteride (PROSCAR) tablet 5 mg  5 mg Oral Daily Lolita Cram Means, MD   5 mg at 11/27/15 0926  . gabapentin (NEURONTIN) capsule 300 mg  300 mg Oral BID Lolita Cram Means, MD   300 mg at 11/27/15 0926  . guaiFENesin-dextromethorphan (ROBITUSSIN DM) 100-10 MG/5ML syrup 10 mL  10 mL Oral Q4H PRN Lolita Cram Means, MD      . heparin ADULT infusion 100 units/mL (25000 units/230mL sodium chloride 0.45%)  1,100 Units/hr Intravenous Continuous Otilio Miu, RPH      . insulin aspart (novoLOG) injection 0-15 Units  0-15 Units Subcutaneous TID WC Lolita Cram Means, MD   2 Units at 11/26/15 1719  . insulin glargine (LANTUS) injection 15 Units  15 Units Subcutaneous Daily  Lolita Cram Means, MD   15 Units at 11/27/15 [phone]  . levothyroxine (SYNTHROID, LEVOTHROID) tablet 112 mcg  112 mcg Oral QAC breakfast Lolita Cram Means, MD   112 mcg at 11/27/15 0650  . MEDLINE mouth rinse  15 mL Mouth Rinse BID Lolita Cram Means, MD   15 mL at 11/27/15 1000  . mometasone-formoterol (DULERA) 200-5 MCG/ACT inhaler 2 puff  2 puff Inhalation BID Lolita Cram Means, MD   2 puff at 11/27/15 0740  . nitroGLYCERIN 50 mg in dextrose 5 % 250 mL (0.2 mg/mL) infusion  60 mcg/min Intravenous Titrated Lolita Cram Means, MD 15 mL/hr at 11/27/15 1400 50 mcg/min at 11/27/15 1400  . ondansetron (ZOFRAN) injection 4 mg  4 mg Intravenous Q6H PRN Lolita Cram Means, MD      . oxyCODONE-acetaminophen (PERCOCET/ROXICET) 5-325 MG per tablet 1 tablet  1 tablet Oral Q8H PRN Lolita Cram Means, MD   1 tablet at 11/26/15 1723  . polyethylene glycol (MIRALAX / GLYCOLAX) packet 17 g  17 g Oral Daily PRN Lolita Cram Means, MD      . polyvinyl alcohol (LIQUIFILM TEARS) 1.4 % ophthalmic solution 1-2 drop  1-2 drop Both Eyes TID PRN Lolita Cram Means, MD      . rosuvastatin (CRESTOR) tablet 10 mg  10 mg Oral Daily Lolita Cram Means, MD   10 mg at 11/27/15 0926  . sodium chloride flush (NS) 0.9 % injection 3 mL  3 mL Intravenous Q12H Peter M [name], MD      . sodium chloride flush (NS) 0.9 % injection 3 mL  3 mL Intravenous PRN Peter M [name], MD        Prescriptions Prior to Admission  Medication Sig Dispense Refill Last Dose  . acetaminophen (TYLENOL) 500 MG tablet Take 500 mg by mouth every 8 (eight) hours as needed for mild pain, moderate pain or headache.    Past Month at Unknown time  . aspirin EC 81 MG tablet Take 81 mg by mouth daily.    11/25/2015 at am  . budesonide-formoterol (SYMBICORT) 160-4.5 MCG/ACT inhaler Inhale 2 puffs into the lungs 2 (two) times daily. 3 Inhaler 3 11/25/2015 at am  . CRESTOR 10 MG tablet TAKE ONE TABLET BY MOUTH ONCE DAILY 90 tablet 1 11/25/2015 at am    . dextromethorphan-guaiFENesin (TUSSIN DM) 10-100 MG/5ML liquid Take 10 mLs by mouth every 4 (four) hours as needed for cough.    11/25/2015 at am  . finasteride (PROSCAR) 5 MG tablet Take 5 mg by mouth every morning.    11/25/2015 at am  . gabapentin (NEURONTIN) 300 MG capsule TAKE ONE CAPSULE BY MOUTH 4 TIMES DAILY (Patient taking differently: Take 300 mg by mouth in the morning and 300 mg in the evening (may also takes 2 more times daily as needed for nerve pain)) 120 capsule 3 11/25/2015 at 1100  . glucose blood (ONE TOUCH ULTRA TEST) test strip Use as instructed to check blood sugar 3 times per day dx code E11.65 100 each 5 Taking  . HUMALOG MIX 75/25 KWIKPEN (75-25) 100 UNIT/ML Kwikpen INJECT 20 UNITS SUBCUTANEOUSLY IN THE MORNING AND 30 UNITS SUBCUTANEOUSLY IN THE EVENING DAILY (Patient taking differently: Inject 20 units into the skin in the morning then 14 units in the afternoon then 16 units at bedtime) 45 mL 0 11/25/2015 at am  . levothyroxine (SYNTHROID, LEVOTHROID) 112 MCG tablet TAKE ONE TABLET BY MOUTH ONCE DAILY (Patient taking differently: Take 112 mcg by mouth once a day) 90 tablet 0 11/25/2015 at am  . menthol-thymol (ABSORBINE JR) LIQD Apply 1 application topically as needed. For pain.   Past Week at Unknown time  . metFORMIN (GLUCOPHAGE-XR) 500 MG 24 hr tablet TAKE THREE TABLETS BY MOUTH ONCE DAILY WITH SUPPER (Patient taking differently: Take 1,000 mg by mouth in the evening) 90 tablet 5 11/24/2015 at pm  . oxyCODONE-acetaminophen (ROXICET) 5-325 MG tablet Take 1 tablet by mouth every 8 (eight) hours as needed for severe pain. 50 tablet 0 11/25/2015 at am  . Polyethyl Glycol-Propyl Glycol (SYSTANE) 0.4-0.3 % SOLN Apply 1-2 drops to eye 3 (three) times daily as needed (for dry eyes).   11/25/2015 at am  . polyethylene glycol (MIRALAX / GLYCOLAX) packet Take 17 g by mouth daily as needed for mild constipation, moderate constipation or severe constipation. 1 capful daily as needed   Past Month  at Unknown time  . TOUJEO SOLOSTAR 300 UNIT/ML SOPN INJECT 42 UNITS SUBCUTANEOUSLY EVERY MORNING (Patient taking differently: Inject 32 units into the skin in the morning) 6 mL 3 11/25/2015 at am  . triamcinolone (NASACORT AQ) 55 MCG/ACT AERO nasal inhaler Place 1-2 sprays into the nose daily.    11/25/2015 at am  . vitamin B-12 (CYANOCOBALAMIN) 1000 MCG tablet Take 1,000 mcg by mouth daily.   11/25/2015 at am  . QUEtiapine (SEROQUEL) 25 MG tablet Take 1 tablet (25 mg total) by mouth at bedtime. (Patient not taking: Reported on 11/25/2015) 30 tablet 1 Not Taking at Unknown time    Family History  Problem Relation Age of Onset  . Heart disease  Mother   . Stroke Father   . Cancer Brother     unsure of type   Review of Systems  Constitutional: Positive for diaphoresis and malaise/fatigue. Negative for chills, fever and weight loss.  HENT: Positive for congestion, hearing loss and sore throat. Negative for ear discharge, ear pain, nosebleeds and tinnitus.   Eyes: Negative for blurred vision, double vision, photophobia, pain, discharge and redness.  Respiratory: Positive for cough, sputum production and shortness of breath. Negative for hemoptysis, wheezing and stridor.   Cardiovascular: Positive for chest pain and claudication. Negative for palpitations, orthopnea and PND.  Gastrointestinal: Positive for blood in stool and constipation. Negative for abdominal pain, heartburn, melena, nausea and vomiting.       Remote blood in stools, 10 yrs ago  Genitourinary: Positive for frequency. Negative for dysuria.  Musculoskeletal: Positive for back pain, joint pain, myalgias and neck pain. Negative for falls.  Skin: Negative for itching and rash.  Neurological: Positive for weakness. Negative for dizziness, tingling, tremors, sensory change, speech change, focal weakness, seizures, loss of consciousness and headaches.       Recoverong alcoholic- none since [id number]  Endo/Heme/Allergies: Positive for  environmental allergies and polydipsia. Bruises/bleeds easily.  Psychiatric/Behavioral: Positive for depression, memory loss and substance abuse. Negative for hallucinations and suicidal ideas. The patient is nervous/anxious and has insomnia.        No hallucinations since stopped ETOH    Physical Exam: BP 133/73   Pulse (!) 54   Temp 98.8 F (37.1 C) (Oral)   Resp 18   Ht 5\' 11"  (1.803 m)   Wt 189 lb (85.7 kg)   SpO2 97%   BMI 26.36 kg/m   Physical Exam  Constitutional: He appears chronically ill.  HENT:  Nose: No nasal discharge.  Mouth/Throat: Oropharynx is clear. Pharynx is normal.  dentures  Eyes: Conjunctivae are normal. Pupils are equal, round, and reactive to light.  Neck: Thyroid normal. No neck adenopathy. No thyromegaly present.  Stiff neck  Cardiovascular: Normal rate, regular rhythm and normal heart sounds.  PMI is not displaced.  Exam reveals no gallop.   Pulses:      Dorsalis pedis pulses are 2+ on the right side, and 2+ on the left side.       Posterior tibial pulses are 0 on the right side, and 0 on the left side.  Soft systolic aortic murmur  Pulmonary/Chest: He exhibits no tenderness.  Coarse BS throughout  Abdominal: Soft. Bowel sounds are normal. He exhibits mass. There is no splenomegaly or hepatomegaly. There is tenderness. No hernia.  Musculoskeletal: He exhibits deformity. He exhibits no edema or tenderness.  Neurological: He is alert and oriented to person, place, and time. He has normal motor skills.  Skin: Skin is warm and dry. No rash noted. No cyanosis. Nails show no clubbing.    Diagnostic Studies & Laboratory data:     Recent Radiology Findings:   Dg Chest Port 1 View  Result Date: 11/25/2015 CLINICAL DATA:  Chest pain, shortness of breath, pain down both arms all day, hypertension, diabetes mellitus, asthma, prior MI, prostate cancer EXAM: PORTABLE CHEST 1 VIEW COMPARISON:  Portable exam 1832 hours compared 10/12/2013 FINDINGS: Enlargement  of cardiac silhouette with minimal vascular congestion. Atherosclerotic calcification aorta. Emphysematous changes compatible with COPD. Interstitial changes at the lung bases bilaterally question interstitial infiltrates and edema with probable component of atelectasis as well. Upper lungs clear. No pleural effusion or pneumothorax. Bones demineralized. IMPRESSION: Enlargement of cardiac silhouette with  minimal vascular congestion. Aortic atherosclerosis. COPD changes with mild interstitial edema at the lung bases and probable component of atelectasis. Electronically Signed   By: Lavonia Dana M.D.   On: 11/25/2015 18:41     I have independently reviewed the above radiologic studies.  Recent Lab Findings: Lab Results  Component Value Date   WBC 9.0 11/27/2015   HGB 9.6 (L) 11/27/2015   HCT 31.8 (L) 11/27/2015   PLT 124 (L) 11/27/2015   GLUCOSE 102 (H) 11/27/2015   CHOL 117 10/31/2015   TRIG 98.0 10/31/2015   HDL 44.60 10/31/2015   LDLCALC 53 10/31/2015   ALT 6 10/31/2015   AST 11 10/31/2015   NA 141 11/27/2015   K 4.2 11/27/2015   CL 112 (H) 11/27/2015   CREATININE 1.14 11/27/2015   BUN 16 11/27/2015   CO2 21 (L) 11/27/2015   TSH 0.873 11/26/2015   INR 1.23 11/26/2015   HGBA1C 7.4 (H) 10/31/2015   Procedures   Left Heart Cath and Coronary Angiography  Conclusion     Ost LM to LM lesion, 80 %stenosed.  Prox LAD lesion, 80 %stenosed.  Mid LAD lesion, 90 %stenosed.  Dist LAD lesion, 95 %stenosed.  Ost 1st Diag to 1st Diag lesion, 100 %stenosed.  Ost Ramus lesion, 90 %stenosed.  Ost Cx to Prox Cx lesion, 90 %stenosed.  Ost 1st Mrg to 1st Mrg lesion, 80 %stenosed.  Prox Cx to Mid Cx lesion, 100 %stenosed.  Prox RCA lesion, 95 %stenosed.  Dist RCA lesion, 90 %stenosed.   1. Critical 3 vessel and left main CAD 2. Mildly elevated LV EDP 3. No significant AV gradient by pullback.   Indications   NSTEMI (non-ST elevated myocardial infarction) ([place]) [I21.4  (ICD-10-CM)]  Procedural Details/Technique   Technical Details Indication: 82 yo WM with NSTEMI and severe persistent class IV angina.  Procedural Details: The right wrist was prepped, draped, and anesthetized with 1% lidocaine. Using the modified Seldinger technique, a 6 French slender sheath was introduced into the right radial artery. 3 mg of verapamil was administered through the sheath, weight-based unfractionated heparin was administered intravenously. Standard Judkins catheters were used for selective coronary angiography and left ventriculography. Catheter exchanges were performed over an exchange length guidewire. There were no immediate procedural complications. A TR band was used for radial hemostasis at the completion of the procedure. The patient was transferred to the post catheterization recovery area for further monitoring.  Contrast: 80 cc   Estimated blood loss <50 mL. . During this procedure the patient was administered the following to achieve and maintain moderate conscious sedation: Versed 1 mg, Fentanyl 50 mcg, while the patient's heart rate, blood pressure, and oxygen saturation were continuously monitored. The period of conscious sedation was 34 minutes, of which I was present face-to-face 100% of this time.    Complications   Coronary Findings   Dominance: Right  Left Main  Ost LM to LM lesion, 80% stenosed.  Left Anterior Descending  Prox LAD lesion, 80% stenosed.  Mid LAD lesion, 90% stenosed.  Dist LAD lesion, 95% stenosed.  First Diagonal Branch  1st Diag filled by collaterals from Lat 2nd Diag.  Ost 1st Diag to 1st Diag lesion, 100% stenosed.  Ramus Intermedius  Ost Ramus lesion, 90% stenosed.  Left Circumflex  Ost Cx to Prox Cx lesion, 90% stenosed.  Prox Cx to Mid Cx lesion, 100% stenosed.  First Obtuse Marginal Branch  Ost 1st Mrg to 1st Mrg lesion, 80% stenosed. The lesion is severely calcified.  Second Obtuse Marginal Branch  2nd Mrg filled by  collaterals from 1st Mrg.  Right Coronary Artery  Prox RCA lesion, 95% stenosed. The lesion is severely calcified.  Dist RCA lesion, 90% stenosed.  Coronary Diagrams   Diagnostic Diagram      Chronic Kidney Disease   Stage I     GFR >90  Stage II    GFR 60-89  Stage IIIA GFR 45-59  Stage IIIB GFR 30-44  Stage IV   GFR 15-29  Stage V    GFR  <15  Lab Results  Component Value Date   CREATININE 1.14 11/27/2015   Estimated Creatinine Clearance: 55 mL/min (by C-G formula based on SCr of 1.14 mg/dL).    Assessment / Plan: 1/ Severe multivessel CAD  With NSTEMI- I have discussed with the patient and his wife the option of CABG, he has multiple risk factors including a diffusely diseased LAD, the distal vessel is not by passable. ECHO result from this admission is pending but previous echo and no gradient across aortic valve at cath today would suggest he does not have significant valve disease . Plan reviewed of current echo and consider CABG Wednesday. 2/ anemia of unknown cause   NSTEMI (non-ST elevated myocardial infarction) ([place])  CKD (chronic kidney disease), stage III  Memory loss  Cough variant asthma  Iron deficiency anemia  Irregular heart beat  Type II diabetes mellitus, uncontrolled ([facility])  Mitral regurgitation and aortic stenosis  Pure hypercholesterolemia  Hypothyroidism  Osteoarthritis  Prostate cancer ([place])    [name] MD      [address] [place], [address] Office [phone]   [place]

## 2015-11-27 NOTE — Progress Notes (Signed)
Patient Name: Barry MIKITA Sr. Date of Encounter: 11/27/2015  Primary Cardiologist: Dr. Debbe Odea Problem List     Principal Problem:   NSTEMI (non-ST elevated myocardial infarction) [facility]) Active Problems:   Type II diabetes mellitus, uncontrolled (HCC)   Mitral regurgitation and aortic stenosis   Pure hypercholesterolemia   Hypothyroidism   Irregular heart beat   Iron deficiency anemia   Cough variant asthma   CKD (chronic kidney disease), stage III     Subjective   No pain wants to get cath done   Inpatient Medications    . aspirin EC  81 mg Oral Daily  . finasteride  5 mg Oral Daily  . gabapentin  300 mg Oral BID  . insulin aspart  0-15 Units Subcutaneous TID WC  . insulin glargine  15 Units Subcutaneous Daily  . levothyroxine  112 mcg Oral QAC breakfast  . mouth rinse  15 mL Mouth Rinse BID  . mometasone-formoterol  2 puff Inhalation BID  . rosuvastatin  10 mg Oral Daily  . sodium chloride flush  3 mL Intravenous Q12H   Scheduled Meds: . aspirin EC  81 mg Oral Daily  . finasteride  5 mg Oral Daily  . gabapentin  300 mg Oral BID  . insulin aspart  0-15 Units Subcutaneous TID WC  . insulin glargine  15 Units Subcutaneous Daily  . levothyroxine  112 mcg Oral QAC breakfast  . mouth rinse  15 mL Mouth Rinse BID  . mometasone-formoterol  2 puff Inhalation BID  . rosuvastatin  10 mg Oral Daily  . sodium chloride flush  3 mL Intravenous Q12H   Continuous Infusions: . sodium chloride 75 mL/hr at 11/27/15 0800  . sodium chloride    . heparin 1,100 Units/hr (11/27/15 0800)  . nitroGLYCERIN 60 mcg/min (11/27/15 0800)   PRN Meds:.sodium chloride, acetaminophen, guaiFENesin-dextromethorphan, ondansetron (ZOFRAN) IV, oxyCODONE-acetaminophen, polyethylene glycol, polyvinyl alcohol, sodium chloride flush   Vital Signs    Vitals:   11/27/15 0137 11/27/15 0539 11/27/15 0740 11/27/15 0743  BP: (!) 160/101 125/89  106/86  Pulse:      Resp:  16  18  Temp:   98.5 F (36.9 C)  98.3 F (36.8 C)  TempSrc:  Oral  Oral  SpO2: 97% 100% 99% 98%  Weight:      Height:        Intake/Output Summary (Last 24 hours) at 11/27/15 0839 Last data filed at 11/27/15 0400  Gross per 24 hour  Intake           1276.1 ml  Output              250 ml  Net           1026.1 ml   Filed Weights   11/25/15 1930 11/26/15 0549  Weight: 85.7 kg (188 lb 15 oz) 85.7 kg (189 lb)    Physical Exam    GEN: Well nourished, well developed, in no acute distress.  HEENT: Grossly normal.  Neck: Supple, no JVD, carotid bruits, or masses. Cardiac: RRR, 2/6 systolic ejection murmur right upper sternal border, no rubs, or gallops. No clubbing, cyanosis, edema.  Radials/DP/PT 2+ and equal bilaterally.  Respiratory:  Respirations regular and unlabored, clear to auscultation bilaterally. GI: Soft, nontender, nondistended, BS + x 4. Overweight MS: no deformity or atrophy. Skin: warm and dry, no rash. Neuro:  Strength and sensation are intact. Psych: AAOx3.  Normal affect.  Labs    CBC  Recent  Labs  11/26/15 0527 11/27/15 0537  WBC 10.2 9.0  HGB 10.1* 9.6*  HCT 32.5* 31.8*  MCV 88.8 89.3  PLT 135* [id number]*   Basic Metabolic Panel  Recent Labs  11/25/15 1833 11/25/15 2309  NA 140 141  K 4.6 5.4*  CL 113* 113*  CO2 16* 19*  GLUCOSE 226* 210*  BUN 25* 24*  CREATININE 1.37* 1.42*  CALCIUM 9.2 9.0   Liver Function Tests No results for input(s): AST, ALT, ALKPHOS, BILITOT, PROT, ALBUMIN in the last 72 hours. No results for input(s): LIPASE, AMYLASE in the last 72 hours. Cardiac Enzymes  Recent Labs  11/25/15 2254 11/26/15 0725 11/26/15 1353  TROPONINI 0.99* 1.57* 1.62*   BNP Invalid input(s): POCBNP D-Dimer No results for input(s): DDIMER in the last 72 hours. Hemoglobin A1C No results for input(s): HGBA1C in the last 72 hours. Fasting Lipid Panel No results for input(s): CHOL, HDL, LDLCALC, TRIG, CHOLHDL, LDLDIRECT in the last 72 hours. Thyroid  Function Tests  Recent Labs  11/26/15 0527  TSH 0.873    Telemetry    No adverse arrhythmias, personally viewed  ECG    11/26/15-significant Mobitz type I, right bundle branch block, left anterior fascicular block, bifascicular block, poor R-wave progression, personally viewed  Radiology    Dg Chest Port 1 View  Result Date: 11/25/2015 CLINICAL DATA:  Chest pain, shortness of breath, pain down both arms all day, hypertension, diabetes mellitus, asthma, prior MI, prostate cancer EXAM: PORTABLE CHEST 1 VIEW COMPARISON:  Portable exam 1832 hours compared 10/12/2013 FINDINGS: Enlargement of cardiac silhouette with minimal vascular congestion. Atherosclerotic calcification aorta. Emphysematous changes compatible with COPD. Interstitial changes at the lung bases bilaterally question interstitial infiltrates and edema with probable component of atelectasis as well. Upper lungs clear. No pleural effusion or pneumothorax. Bones demineralized. IMPRESSION: Enlargement of cardiac silhouette with minimal vascular congestion. Aortic atherosclerosis. COPD changes with mild interstitial edema at the lung bases and probable component of atelectasis. Electronically Signed   By: Lavonia Dana M.D.   On: 11/25/2015 18:41   Echo 12/11/12 Study Conclusions  - Left ventricle: The cavity size was normal. Wall thickness was increased in a pattern of moderate LVH. Systolic function was normal. The estimated ejection fraction was in the range of 55% to 65%. Wall motion was normal; there were no regional wall motion abnormalities. Doppler parameters are consistent with abnormal left ventricular relaxation (grade 1 diastolic dysfunction). - Aortic valve: There was mild stenosis. - Mitral valve: Calcified annulus. Mildly thickened leaflets . Mild regurgitation. - Left atrium: The atrium was mildly dilated.  ETT: 09/14/14 Max HR 117 no ischemia or high grade HB  Inconclusive Lexiscan 2014   Patient Profile       82 year old male with non-ST elevation myocardial infarction, insulin-dependent diabetes, hyperlipidemia, hypertension, hypothyroidism, strong family history of CAD, mild to moderate aortic stenosis, untreated obstructive sleep apnea, chronic right bundle branch block, first-degree AV block, left anterior fascicular block.  Assessment & Plan    Non-ST elevation myocardial infarction  - Cardiac catheterization Monday 11/27/15. Risks and benefits of procedure including stroke, heart attack, renal impairment, bleeding, death have been described, willing to proceed.  - Continuing with aspirin, no beta blocker because of significant Mobitz type I conduction disease, continue statin, IV heparin, IV nitroglycerin  - Troponin increased to 1.57.  Bifascicular block, Mobitz type I  - Significant conduction abnormality seen on EKG, avoiding Beta blockers. No syncope. This am telemetry with 3:2 wenkebach   Chronic kidney disease stage  III/diabetic nephropathy  - Creatinine ranging from 1.15-1.42. Last value 1.42  - Hydrate prior to cardiac catheterization, 75 mL per hour.  Hyperlipidemia  - LDL 53. Continue Crestor.  Mild anemia  - 12.4 ->10.1, platelets 135 down from 179. Continue to monitor. No active signs of bleeding.  Diabetes with nephropathy  - Insulin.  - Dr. Dwyane Dee  Signed, Jenkins Rouge, MD  11/27/2015, 8:39 AM

## 2015-11-27 NOTE — Progress Notes (Addendum)
Upon shift assessment pt appeared to be slightly confused. Around 9pm pt confusion progressively became worse. Pt became combative with staff and made multiple attempts to get out of bed. Pt was instructed the importance of staying in the bed. Wife was called, no answer. Called Dr Wynonia Lawman and instructed to put pt in restraints. Decided again restraints because pt responded to verbal commands and did not want to increase anxiety. Wife was called again, left voicemail. Wife called back around 2am, came to sit with patient. Pt resting now comfortably, confusion has decreased. Will continue to monitor.

## 2015-11-27 NOTE — Interval H&P Note (Signed)
History and Physical Interval Note:  11/27/2015 11:02 AM  Barry Odor Nadeem Sr.  has presented today for surgery, with the diagnosis of usntable angina  The various methods of treatment have been discussed with the patient and family. After consideration of risks, benefits and other options for treatment, the patient has consented to  Procedure(s): Left Heart Cath and Coronary Angiography (N/A) as a surgical intervention .  The patient's history has been reviewed, patient examined, no change in status, stable for surgery.  I have reviewed the patient's chart and labs.  Questions were answered to the patient's satisfaction.    Cath Lab Visit (complete for each Cath Lab visit)  Clinical Evaluation Leading to the Procedure:   ACS: Yes.    Non-ACS:    Anginal Classification: CCS IV  Anti-ischemic medical therapy: Minimal Therapy (1 class of medications)  Non-Invasive Test Results: No non-invasive testing performed  Prior CABG: No previous CABG       Barry Mcdaniel [facility] 11/27/2015 11:02 AM

## 2015-11-27 NOTE — Progress Notes (Signed)
ANTICOAGULATION CONSULT NOTE - Follow Up Consult  Pharmacy Consult for Heparin Indication: critical 3v with left main CAD  Allergies  Allergen Reactions  . Penicillins Swelling    Eyes and lips Has patient had a PCN reaction causing immediate rash, facial/tongue/throat swelling, SOB or lightheadedness with hypotension: Yes Has patient had a PCN reaction causing severe rash involving mucus membranes or skin necrosis: No Has patient had a PCN reaction that required hospitalization No Has patient had a PCN reaction occurring within the last 10 years: No If all of the above answers are "NO", then may proceed with Cephalosporin use   . Prednisone Other (See Comments)    Hyperglycemia and AMS    Patient Measurements: Height: 5\' 11"  (180.3 cm) Weight: 189 lb (85.7 kg) IBW/kg (Calculated) : 75.3  Vital Signs: Temp: 98.8 F (37.1 C) (09/18 1245) Temp Source: Oral (09/18 1245) BP: 118/82 (09/18 1245) Pulse Rate: 83 (09/18 1245)  Labs:  Recent Labs  11/25/15 1833 11/25/15 2254 11/25/15 2309 11/26/15 0527 11/26/15 0725 11/26/15 1353 11/27/15 0537 11/27/15 0757  HGB 12.4*  --   --  10.1*  --   --  9.6*  --   HCT 40.1  --   --  32.5*  --   --  31.8*  --   PLT 179  --   --  135*  --   --  124*  --   LABPROT  --   --   --  15.5*  --   --   --   --   INR  --   --   --  1.23  --   --   --   --   HEPARINUNFRC  --   --   --  0.81*  --  0.60 0.52  --   CREATININE 1.37*  --  1.42*  --   --   --   --  1.14  TROPONINI  --  0.99*  --   --  1.57* 1.62*  --   --     Estimated Creatinine Clearance: 55 mL/min (by C-G formula based on SCr of 1.14 mg/dL).  Assessment: 82yom initially started on heparin for NSTEMI, now s/p cath found to have critical 3 vessel with left main CAD. Heparin to be resumed post-cath awaiting CVTS consult. Heparin was at goal this morning on 1100 units/hr so will resume at this rate. Radial sheath removed at 1140.   Goal of Therapy:  Heparin level 0.3-0.7  units/ml Monitor platelets by anticoagulation protocol: Yes   Plan:  1) ~2000 tonight, resume heparin at 1100 units/hr 2) Daily heparin level and CBC    Deboraha Sprang 11/27/2015,1:11 PM

## 2015-11-28 ENCOUNTER — Inpatient Hospital Stay (HOSPITAL_COMMUNITY): Payer: Medicare Other

## 2015-11-28 ENCOUNTER — Encounter (HOSPITAL_COMMUNITY): Payer: Self-pay | Admitting: *Deleted

## 2015-11-28 DIAGNOSIS — I213 ST elevation (STEMI) myocardial infarction of unspecified site: Secondary | ICD-10-CM

## 2015-11-28 LAB — URINE MICROSCOPIC-ADD ON
RBC / HPF: NONE SEEN RBC/hpf (ref 0–5)
WBC, UA: NONE SEEN WBC/hpf (ref 0–5)

## 2015-11-28 LAB — COMPREHENSIVE METABOLIC PANEL
ALT: 18 U/L (ref 17–63)
AST: 19 U/L (ref 15–41)
Albumin: 2.8 g/dL — ABNORMAL LOW (ref 3.5–5.0)
Alkaline Phosphatase: 61 U/L (ref 38–126)
Anion gap: 6 (ref 5–15)
BUN: 13 mg/dL (ref 6–20)
CO2: 20 mmol/L — ABNORMAL LOW (ref 22–32)
Calcium: 8.3 mg/dL — ABNORMAL LOW (ref 8.9–10.3)
Chloride: 112 mmol/L — ABNORMAL HIGH (ref 101–111)
Creatinine, Ser: 1.12 mg/dL (ref 0.61–1.24)
GFR calc Af Amer: >60 mL/min (ref >60)
GFR calc non Af Amer: >60 mL/min (ref >60)
Glucose, Bld: 141 mg/dL — ABNORMAL HIGH (ref 65–99)
Potassium: 4.3 mmol/L (ref 3.5–5.1)
Sodium: 138 mmol/L (ref 135–145)
Total Bilirubin: 0.9 mg/dL (ref 0.3–1.2)
Total Protein: 5.5 g/dL — ABNORMAL LOW (ref 6.5–8.1)

## 2015-11-28 LAB — URINALYSIS, ROUTINE W REFLEX MICROSCOPIC
Bilirubin Urine: NEGATIVE
Glucose, UA: NEGATIVE mg/dL
Hgb urine dipstick: NEGATIVE
Ketones, ur: NEGATIVE mg/dL
Leukocytes, UA: NEGATIVE
Nitrite: NEGATIVE
Protein, ur: 30 mg/dL — AB
Specific Gravity, Urine: 1.025 (ref 1.005–1.030)
pH: 5.5 (ref 5.0–8.0)

## 2015-11-28 LAB — PULMONARY FUNCTION TEST
FEF 25-75 Post: 0.45 L/sec
FEF 25-75 Pre: 0.41 L/sec
FEF2575-%Change-Post: 10 %
FEF2575-%Pred-Post: 22 %
FEF2575-%Pred-Pre: 20 %
FEV1-%Change-Post: 1 %
FEV1-%Pred-Post: 33 %
FEV1-%Pred-Pre: 32 %
FEV1-Post: 0.99 L
FEV1-Pre: 0.97 L
FEV1FVC-%Change-Post: -5 %
FEV1FVC-%Pred-Pre: 67 %
FEV6-%Change-Post: 1 %
FEV6-%Pred-Post: 50 %
FEV6-%Pred-Pre: 50 %
FEV6-Post: 1.98 L
FEV6-Pre: 1.95 L
FEV6FVC-%Change-Post: -6 %
FEV6FVC-%Pred-Post: 97 %
FEV6FVC-%Pred-Pre: 103 %
FVC-%Change-Post: 7 %
FVC-%Pred-Post: 52 %
FVC-%Pred-Pre: 48 %
FVC-Post: 2.19 L
FVC-Pre: 2.03 L
Post FEV1/FVC ratio: 45 %
Post FEV6/FVC ratio: 90 %
Pre FEV1/FVC ratio: 48 %
Pre FEV6/FVC Ratio: 96 %

## 2015-11-28 LAB — ECHOCARDIOGRAM COMPLETE
HEIGHTINCHES: 71 in
WEIGHTICAEL: 3072 [oz_av]

## 2015-11-28 LAB — GLUCOSE, CAPILLARY
GLUCOSE-CAPILLARY: 114 mg/dL — AB (ref 65–99)
GLUCOSE-CAPILLARY: 162 mg/dL — AB (ref 65–99)
GLUCOSE-CAPILLARY: 125 mg/dL — AB (ref 65–99)
Glucose-Capillary: 136 mg/dL — ABNORMAL HIGH (ref 65–99)
Glucose-Capillary: 126 mg/dL — ABNORMAL HIGH (ref 65–99)

## 2015-11-28 LAB — POCT I-STAT 3, ART BLOOD GAS (G3+)
Acid-base deficit: 3 mmol/L — ABNORMAL HIGH (ref 0.0–2.0)
BICARBONATE: 20.4 mmol/L (ref 20.0–28.0)
O2 Saturation: 97 %
TCO2: 21 mmol/L (ref 0–100)
pCO2 arterial: 31.3 mmHg — ABNORMAL LOW (ref 32.0–48.0)
pH, Arterial: 7.423 (ref 7.350–7.450)
pO2, Arterial: 84 mmHg (ref 83.0–108.0)

## 2015-11-28 LAB — HEPARIN LEVEL (UNFRACTIONATED)
HEPARIN UNFRACTIONATED: 0.23 [IU]/mL — AB (ref 0.30–0.70)
HEPARIN UNFRACTIONATED: 0.3 [IU]/mL (ref 0.30–0.70)

## 2015-11-28 LAB — SURGICAL PCR SCREEN
MRSA, PCR: NEGATIVE
Staphylococcus aureus: POSITIVE — AB

## 2015-11-28 LAB — PROTIME-INR
INR: 1.22
Prothrombin Time: 15.5 seconds — ABNORMAL HIGH (ref 11.4–15.2)

## 2015-11-28 LAB — CBC
HCT: 29.8 % — ABNORMAL LOW (ref 39.0–52.0)
Hemoglobin: 9.3 g/dL — ABNORMAL LOW (ref 13.0–17.0)
MCH: 27.5 pg (ref 26.0–34.0)
MCHC: 31.2 g/dL (ref 30.0–36.0)
MCV: 88.2 fL (ref 78.0–100.0)
PLATELETS: 121 10*3/uL — AB (ref 150–400)
RBC: 3.38 MIL/uL — AB (ref 4.22–5.81)
RDW: 15.3 % (ref 11.5–15.5)
WBC: 7.8 10*3/uL (ref 4.0–10.5)

## 2015-11-28 LAB — HEMOGLOBIN A1C
Hgb A1c MFr Bld: 7 % — ABNORMAL HIGH (ref 4.8–5.6)
Mean Plasma Glucose: 154 mg/dL

## 2015-11-28 LAB — APTT: aPTT: 80 seconds — ABNORMAL HIGH (ref 24–36)

## 2015-11-28 LAB — ABO/RH: ABO/RH(D): A POS

## 2015-11-28 MED ORDER — DEXMEDETOMIDINE HCL IN NACL 400 MCG/100ML IV SOLN
0.1000 ug/kg/h | INTRAVENOUS | Status: DC
  Administered 2015-11-29: .3 ug/kg/h via INTRAVENOUS
  Filled 2015-11-28 (×2): qty 100

## 2015-11-28 MED ORDER — DOPAMINE-DEXTROSE 3.2-5 MG/ML-% IV SOLN
0.0000 ug/kg/min | INTRAVENOUS | Status: DC
  Filled 2015-11-28: qty 250

## 2015-11-28 MED ORDER — TEMAZEPAM 15 MG PO CAPS
15.0000 mg | ORAL_CAPSULE | Freq: Once | ORAL | Status: AC | PRN
  Administered 2015-11-28: 15 mg via ORAL
  Filled 2015-11-28: qty 1

## 2015-11-28 MED ORDER — EPINEPHRINE HCL 1 MG/ML IJ SOLN
0.0000 ug/min | INTRAVENOUS | Status: DC
  Filled 2015-11-28 (×2): qty 4

## 2015-11-28 MED ORDER — PHENYLEPHRINE HCL 10 MG/ML IJ SOLN
30.0000 ug/min | INTRAVENOUS | Status: DC
  Filled 2015-11-28 (×2): qty 2

## 2015-11-28 MED ORDER — NITROGLYCERIN IN D5W 200-5 MCG/ML-% IV SOLN
2.0000 ug/min | INTRAVENOUS | Status: DC
  Administered 2015-11-29: 60 ug/min via INTRAVENOUS
  Filled 2015-11-28: qty 250

## 2015-11-28 MED ORDER — METOPROLOL TARTRATE 12.5 MG HALF TABLET
12.5000 mg | ORAL_TABLET | Freq: Once | ORAL | Status: AC
  Administered 2015-11-29: 12.5 mg via ORAL
  Filled 2015-11-28: qty 1

## 2015-11-28 MED ORDER — MUPIROCIN 2 % EX OINT
1.0000 "application " | TOPICAL_OINTMENT | Freq: Two times a day (BID) | CUTANEOUS | Status: AC
  Administered 2015-11-28 – 2015-12-03 (×9): 1 via NASAL
  Filled 2015-11-28 (×3): qty 22

## 2015-11-28 MED ORDER — PERFLUTREN LIPID MICROSPHERE
INTRAVENOUS | Status: AC
  Administered 2015-11-28: 2 mL via INTRAVENOUS
  Filled 2015-11-28: qty 10

## 2015-11-28 MED ORDER — SODIUM CHLORIDE 0.9 % IV SOLN
INTRAVENOUS | Status: DC
  Administered 2015-11-29: 69.8 mL/h via INTRAVENOUS
  Administered 2015-11-29: 13:00:00 via INTRAVENOUS
  Filled 2015-11-28 (×2): qty 40

## 2015-11-28 MED ORDER — CHLORHEXIDINE GLUCONATE CLOTH 2 % EX PADS
6.0000 | MEDICATED_PAD | Freq: Once | CUTANEOUS | Status: AC
  Administered 2015-11-29: 6 via TOPICAL

## 2015-11-28 MED ORDER — LEVOFLOXACIN IN D5W 500 MG/100ML IV SOLN
500.0000 mg | INTRAVENOUS | Status: DC
  Administered 2015-11-29: 500 mg via INTRAVENOUS
  Filled 2015-11-28 (×3): qty 100

## 2015-11-28 MED ORDER — ALBUTEROL SULFATE (2.5 MG/3ML) 0.083% IN NEBU
2.5000 mg | INHALATION_SOLUTION | Freq: Once | RESPIRATORY_TRACT | Status: AC
  Administered 2015-11-28: 2.5 mg via RESPIRATORY_TRACT

## 2015-11-28 MED ORDER — CHLORHEXIDINE GLUCONATE CLOTH 2 % EX PADS
6.0000 | MEDICATED_PAD | Freq: Once | CUTANEOUS | Status: AC
Start: 2015-11-29 — End: 2015-11-29

## 2015-11-28 MED ORDER — SODIUM CHLORIDE 0.9 % IV SOLN
INTRAVENOUS | Status: DC
  Administered 2015-11-29: 1.9 [IU]/h via INTRAVENOUS
  Filled 2015-11-28 (×2): qty 2.5

## 2015-11-28 MED ORDER — CHLORHEXIDINE GLUCONATE CLOTH 2 % EX PADS
6.0000 | MEDICATED_PAD | Freq: Every day | CUTANEOUS | Status: DC
  Administered 2015-11-28: 6 via TOPICAL

## 2015-11-28 MED ORDER — CHLORHEXIDINE GLUCONATE CLOTH 2 % EX PADS: 6.0000 | MEDICATED_PAD | Freq: Once | CUTANEOUS | Status: DC

## 2015-11-28 MED ORDER — MAGNESIUM SULFATE 50 % IJ SOLN
40.0000 meq | INTRAMUSCULAR | Status: DC
  Filled 2015-11-28 (×2): qty 10

## 2015-11-28 MED ORDER — VANCOMYCIN HCL 10 G IV SOLR
1250.0000 mg | INTRAVENOUS | Status: DC
  Administered 2015-11-29: 1250 mg via INTRAVENOUS
  Filled 2015-11-28 (×2): qty 1250

## 2015-11-28 MED ORDER — CHLORHEXIDINE GLUCONATE 0.12 % MT SOLN
15.0000 mL | Freq: Once | OROMUCOSAL | Status: AC
  Administered 2015-11-29: 15 mL via OROMUCOSAL
  Filled 2015-11-28: qty 15

## 2015-11-28 MED ORDER — BISACODYL 5 MG PO TBEC: 5.0000 mg | DELAYED_RELEASE_TABLET | Freq: Once | ORAL | Status: DC

## 2015-11-28 MED ORDER — POTASSIUM CHLORIDE 2 MEQ/ML IV SOLN
80.0000 meq | INTRAVENOUS | Status: DC
  Filled 2015-11-28 (×2): qty 40

## 2015-11-28 MED ORDER — ENSURE ENLIVE PO LIQD: 237.0000 mL | Freq: Two times a day (BID) | ORAL | Status: DC

## 2015-11-28 MED ORDER — SODIUM CHLORIDE 0.9 % IV SOLN
INTRAVENOUS | Status: DC
  Filled 2015-11-28 (×2): qty 30

## 2015-11-28 MED ORDER — PERFLUTREN LIPID MICROSPHERE
1.0000 mL | INTRAVENOUS | Status: AC | PRN
  Administered 2015-11-28: 2 mL via INTRAVENOUS
  Filled 2015-11-28: qty 10

## 2015-11-28 MED ORDER — PLASMA-LYTE 148 IV SOLN
INTRAVENOUS | Status: DC
  Filled 2015-11-28 (×2): qty 2.5

## 2015-11-28 MED ORDER — PNEUMOCOCCAL VAC POLYVALENT 25 MCG/0.5ML IJ INJ: 0.5000 mL | INJECTION | INTRAMUSCULAR | Status: DC

## 2015-11-28 MED FILL — Nitroglycerin SL Tab 0.4 MG: SUBLINGUAL | Qty: 1 | Status: AC

## 2015-11-28 NOTE — Progress Notes (Signed)
[phone] Did not walk with pt since he has LM disease and severe 3VD. Gave pt and wife OHS booklet and care guide. Wrote down how to view pre op video. Pt does not have IS yet. Discussed importance of IS and walking after surgery. Demonstrated how to get and up down without use of arms and discussed sternal precautions. Wife will be able to stay 24/7 after discharge.  Will follow up after surgery. Graylon Good RN BSN 11/28/2015 9:28 AM

## 2015-11-28 NOTE — Progress Notes (Signed)
Initial Nutrition Assessment  DOCUMENTATION CODES:   Not applicable  INTERVENTION:  Discontinued Ensure Enlive as patient is refusing to drink.  Recommend Magic cup TID with meals, each supplement provides 290 kcal and 9 grams of protein.  Will monitor intake when diet advanced after CABG and see if additional interventions warranted.  NUTRITION DIAGNOSIS:   Inadequate oral intake related to poor appetite as evidenced by meal completion < 25%, per patient/family report.  GOAL:   Patient will meet greater than or equal to 90% of their needs  MONITOR:   PO intake, Supplement acceptance, Weight trends, I & O's  REASON FOR ASSESSMENT:   Malnutrition Screening Tool    ASSESSMENT:   82 YO without known CAD, but risk factors including DM II on insulin, HLD, HTN, hypothyroidism, strong family history of CAD, mild to moderate aortic stenosis, untreated OSA, and chronic RBBB/1st degree AVB/LAFB presenting with acute onset chest pain and diaphoresis to the ED, found to have NSTEMI.   S/p procedure on 9/18: Left Heart Cath and Coronary Angiography   Plan for CABG 9/20.  Patient reports he had good appetite and intake PTA, but his appetite is poor now because he does not feel well and doesn't like the food provided. Hopeful his intake will improve after CABG. Reports UBW 195 lbs, thinks might have lost 5 lbs over unknown time period. Per chart, patient lost 3.3 kg (4% body weight) over 4 months (02/10/2015 to 06/01/2015) and has maintained weight around 188-189 lbs since March. Weight loss not significant enough to diagnose malnutrition.  Meal Completion: 0-20% per chart  Medications reviewed and include: Novolog sliding scale TID with meals, Lantus 15 units daily.   Labs reviewed: CBG 114-191.  Nutrition-Focused physical exam completed. Findings are mild fat depletion in orbital region, mild muscle depletion in temple region, and no edema. Other areas assessed appear well  nourished.  Discussed plan with RN. Although not initially in medical chart, wife has reported that the patient has dementia. He eats sweets and the wife has trouble getting him to eat healthy foods. RN reports that patient has been refusing Ensure, but enjoys pudding and ice cream.   Diet Order:  Diet Heart Room service appropriate? Yes; Fluid consistency: Thin Diet NPO time specified  Skin:  Reviewed, no issues  Last BM:  11/26/2015  Height:   Ht Readings from Last 1 Encounters:  11/26/15 5\' 11"  (1.803 m)    Weight:   Wt Readings from Last 1 Encounters:  11/28/15 192 lb (87.1 kg)    Ideal Body Weight:  78.18 kg  BMI:  Body mass index is 26.78 kg/m.  Estimated Nutritional Needs:   Kcal:  2200-2500  Protein:  120-135 grams (1.4-1.6 g/kg post-op)  Fluid:  2.2-2.5 L/day or per team  EDUCATION NEEDS:   No education needs identified at this time  Willey Blade, MS, RD, LDN Pager: [phone] After Hours Pager: [phone]

## 2015-11-28 NOTE — Anesthesia Preprocedure Evaluation (Addendum)
Anesthesia Evaluation  Patient identified by MRN, date of birth, ID band Patient awake    Reviewed: Allergy & Precautions, H&P , NPO status , Patient's Chart, lab work & pertinent test results  Airway Mallampati: II  TM Distance: >3 FB Neck ROM: Full    Dental no notable dental hx. (+) Edentulous Upper, Edentulous Lower, Dental Advisory Given   Pulmonary asthma , sleep apnea ,    Pulmonary exam normal breath sounds clear to auscultation       Cardiovascular + CAD and + Past MI  + dysrhythmias + Valvular Problems/Murmurs AS  Rhythm:Regular Rate:Normal     Neuro/Psych negative neurological ROS  negative psych ROS   GI/Hepatic Neg liver ROS, GERD  ,  Endo/Other  diabetes, Insulin Dependent, Oral Hypoglycemic AgentsHypothyroidism   Renal/GU negative Renal ROS  negative genitourinary   Musculoskeletal  (+) Arthritis , Osteoarthritis,    Abdominal   Peds  Hematology negative hematology ROS (+) anemia ,   Anesthesia Other Findings   Reproductive/Obstetrics negative OB ROS                           Anesthesia Physical Anesthesia Plan  ASA: IV  Anesthesia Plan: General   Post-op Pain Management:    Induction: Intravenous  Airway Management Planned: Oral ETT  Additional Equipment: Arterial line, CVP, PA Cath, TEE and Ultrasound Guidance Line Placement  Intra-op Plan:   Post-operative Plan: Post-operative intubation/ventilation  Informed Consent: I have reviewed the patients History and Physical, chart, labs and discussed the procedure including the risks, benefits and alternatives for the proposed anesthesia with the patient or authorized representative who has indicated his/her understanding and acceptance.   Dental advisory given  Plan Discussed with: CRNA  Anesthesia Plan Comments:         Anesthesia Quick Evaluation

## 2015-11-28 NOTE — Progress Notes (Signed)
Patient ID: Barry Officer Sr., male   DOB: 1932-07-01, 82 y.o.   MRN: [id number] [facility] DAILY ICU PROGRESS NOTE                   [address]            [place] [address]          [phone]   1 Day Post-Op Procedure(s) (LRB): Left Heart Cath and Coronary Angiography (N/A)  Total Length of Stay:  LOS: 3 days   Subjective: No chest pain today  Objective: Vital signs in last 24 hours: Temp:  [98 F (36.7 C)-99.1 F (37.3 C)] 98.4 F (36.9 C) (09/19 1700) Pulse Rate:  [38-157] 79 (09/19 1700) Cardiac Rhythm: Atrial fibrillation (09/19 0800) Resp:  [15-31] 27 (09/19 1700) BP: (98-143)/(59-115) 123/84 (09/19 1700) SpO2:  [91 %-99 %] 99 % (09/19 1700) Weight:  [192 lb (87.1 kg)] 192 lb (87.1 kg) (09/19 0500)  Filed Weights   11/25/15 1930 11/26/15 0549 11/28/15 0500  Weight: 188 lb 15 oz (85.7 kg) 189 lb (85.7 kg) 192 lb (87.1 kg)    Weight change:    Hemodynamic parameters for last 24 hours:    Intake/Output from previous day: 09/18 0701 - 09/19 0700 In: 1269 [P.O.:600; I.V.:669] Out: 610 [Urine:610]  Intake/Output this shift: Total I/O In: 402.8 [P.O.:100; I.V.:302.8] Out: 550 [Urine:550]  Current Meds: Scheduled Meds: . [START ON 11/29/2015] aminocaproic acid (AMICAR) for OHS   Intravenous To OR  . aspirin EC  81 mg Oral Daily  . bisacodyl  5 mg Oral Once  . [START ON 11/29/2015] chlorhexidine  15 mL Mouth/Throat Once  . Chlorhexidine Gluconate Cloth  6 each Topical Once  . [START ON 11/29/2015] Chlorhexidine Gluconate Cloth  6 each Topical Once  . Chlorhexidine Gluconate Cloth  6 each Topical Daily  . [START ON 11/29/2015] dexmedetomidine  0.1-0.7 mcg/kg/hr Intravenous To OR  . [START ON 11/29/2015] DOPamine  0-10 mcg/kg/min Intravenous To OR  . [START ON 11/29/2015] epinephrine  0-10 mcg/min Intravenous To OR  . finasteride  5 mg Oral Daily  . gabapentin  300 mg Oral BID  . [START ON 11/29/2015] heparin-papaverine-plasmalyte irrigation    Irrigation To OR  . [START ON 11/29/2015] heparin 30,000 units/NS 1000 mL solution for CELLSAVER   Other To OR  . insulin aspart  0-15 Units Subcutaneous TID WC  . insulin glargine  15 Units Subcutaneous Daily  . [START ON 11/29/2015] insulin (NOVOLIN-R) infusion   Intravenous To OR  . [START ON 11/29/2015] levofloxacin (LEVAQUIN) IV  500 mg Intravenous To OR  . levothyroxine  112 mcg Oral QAC breakfast  . [START ON 11/29/2015] magnesium sulfate  40 mEq Other To OR  . mouth rinse  15 mL Mouth Rinse BID  . [START ON 11/29/2015] metoprolol tartrate  12.5 mg Oral Once  . mometasone-formoterol  2 puff Inhalation BID  . mupirocin ointment  1 application Nasal BID  . [START ON 11/29/2015] nitroGLYCERIN  2-200 mcg/min Intravenous To OR  . [START ON 11/29/2015] phenylephrine (NEO-SYNEPHRINE) Adult infusion  30-200 mcg/min Intravenous To OR  . [START ON 11/29/2015] pneumococcal 23 valent vaccine  0.5 mL Intramuscular Tomorrow-1000  . [START ON 11/29/2015] potassium chloride  80 mEq Other To OR  . rosuvastatin  10 mg Oral Daily  . sodium chloride flush  3 mL Intravenous Q12H  . [START ON 11/29/2015] vancomycin  1,250 mg Intravenous To OR   Continuous Infusions: . heparin 1,350  Units/hr (11/28/15 1516)  . nitroGLYCERIN 60 mcg/min (11/28/15 0941)   PRN Meds:.sodium chloride, acetaminophen, guaiFENesin-dextromethorphan, ondansetron (ZOFRAN) IV, oxyCODONE-acetaminophen, polyethylene glycol, polyvinyl alcohol, sodium chloride flush, temazepam  General appearance: alert and cooperative Neurologic: intact Heart: regular rate and rhythm, S1, S2 normal, no murmur, click, rub or gallop Lungs: diminished breath sounds bibasilar Abdomen: soft, non-tender; bowel sounds normal; no masses,  no organomegaly Extremities: extremities normal, atraumatic, no cyanosis or edema and Homans sign is negative, no sign of DVT  Lab Results: CBC: Recent Labs  11/27/15 0537 11/28/15 0543  WBC 9.0 7.8  HGB 9.6* 9.3*  HCT  31.8* 29.8*  PLT 124* 121*   BMET:  Recent Labs  11/27/15 0757 11/28/15 0931  NA 141 138  K 4.2 4.3  CL 112* 112*  CO2 21* 20*  GLUCOSE 102* 141*  BUN 16 13  CREATININE 1.14 1.12  CALCIUM 8.2* 8.3*    CMET: Lab Results  Component Value Date   WBC 7.8 11/28/2015   HGB 9.3 (L) 11/28/2015   HCT 29.8 (L) 11/28/2015   PLT 121 (L) 11/28/2015   GLUCOSE 141 (H) 11/28/2015   CHOL 117 10/31/2015   TRIG 98.0 10/31/2015   HDL 44.60 10/31/2015   LDLCALC 53 10/31/2015   ALT 18 11/28/2015   AST 19 11/28/2015   NA 138 11/28/2015   K 4.3 11/28/2015   CL 112 (H) 11/28/2015   CREATININE 1.12 11/28/2015   BUN 13 11/28/2015   CO2 20 (L) 11/28/2015   TSH 0.873 11/26/2015   INR 1.22 11/28/2015   HGBA1C 7.4 (H) 10/31/2015   MICROALBUR 2.9 (H) 03/08/2015    PT/INR:  Recent Labs  11/28/15 0931  LABPROT 15.5*  INR 1.22   Radiology: No results found.  PFT' screening done  Pulmonary Function Diagnosis: Severe Obstructive Airways Disease FEV1 .97 32 %  Echo:  ------------------------------------------------------------------- LV EF: 45% -   50%  ------------------------------------------------------------------- Indications:      MI - acute 410.91.  ------------------------------------------------------------------- History:   PMH:  Right bundle branch block. Obstructive sleep apnea. Hypothyroidism. Chronic back pain.  PMH:   Myocardial infarction.  Risk factors:  Diabetes mellitus.  ------------------------------------------------------------------- Study Conclusions  - Left ventricle: The cavity size was normal. There was severe   concentric hypertrophy. Systolic function was mildly reduced. The   estimated ejection fraction was in the range of 45% to 50%.   Hypokinesis of the apical, mid-apical anteroseptal and   inferoseptal myocardium, and apical inferolateral, and inferior   myocardium. Doppler parameters are consistent with a reversible   restrictive  pattern, indicative of decreased left ventricular   diastolic compliance and/or increased left atrial pressure (grade   3 diastolic dysfunction). - Aortic valve: Trileaflet; moderately thickened, moderately   calcified leaflets. Valve mobility was restricted. There was mild   stenosis. There was no regurgitation. Valve area (VTI): 0.88   cm^2. Valve area (Vmax): 0.96 cm^2. Valve area (Vmean): 0.86   cm^2. - Mitral valve: Calcified annulus. Transvalvular velocity was   within the normal range. There was no evidence for stenosis.   There was mild regurgitation. - Left atrium: The atrium was severely dilated. - Right ventricle: The cavity size was normal. Wall thickness was   normal. Systolic function was normal. - Atrial septum: A patent foramen ovale cannot be excluded. - Tricuspid valve: There was mild regurgitation.  Impressions:  - Aortic valve appears heavily calcified and thickend. There   appears to be mild aortic stenosis by peak velocity and mean  gradients. However, given reduced systolic function, cannot rule   out more severe valvular heart disease or &quot;low flow, low   gradient&quot; aortic stenosis.  ------------------------------------------------------------------- Study data:  Comparison was made to the study of 02/21/2015.  Study status:  Routine.  Procedure:  Transthoracic echocardiography. Image quality was adequate. Intravenous contrast (Definity) was administered.  Study completion:  There were no complications.     Transthoracic echocardiography.  M-mode, complete 2D, spectral Doppler, and color Doppler.  Birthdate:  Patient birthdate: 20-Sep-1932.  Age:  Patient is 82 yr old.  Sex:  Gender: male. BMI: 26.4 kg/m^2.  Blood pressure:     139/74  Patient status: Inpatient.  Study date:  Study date: 11/28/2015. Study time: 09:23 AM.  Location:   ICU/CCU  -------------------------------------------------------------------  ------------------------------------------------------------------- Left ventricle:  The cavity size was normal. There was severe concentric hypertrophy. Systolic function was mildly reduced. The estimated ejection fraction was in the range of 45% to 50%. Regional wall motion abnormalities:  Hypokinesis of the apical, mid-apical anteroseptal and inferoseptal myocardium, and apical inferolateral, and inferior myocardium. Doppler parameters are consistent with a reversible restrictive pattern, indicative of decreased left ventricular diastolic compliance and/or increased left atrial pressure (grade 3 diastolic dysfunction).  ------------------------------------------------------------------- Aortic valve:   Trileaflet; moderately thickened, moderately calcified leaflets. Valve mobility was restricted.  Doppler: There was mild stenosis.   There was no regurgitation.    VTI ratio of LVOT to aortic valve: 0.23. Valve area (VTI): 0.88 cm^2. Indexed valve area (VTI): 0.42 cm^2/m^2. Peak velocity ratio of LVOT to aortic valve: 0.25. Valve area (Vmax): 0.96 cm^2. Indexed valve area (Vmax): 0.46 cm^2/m^2. Mean velocity ratio of LVOT to aortic valve: 0.23. Valve area (Vmean): 0.86 cm^2. Indexed valve area (Vmean): 0.41 cm^2/m^2.    Mean gradient (S): 18 mm Hg. Peak gradient (S): 30 mm Hg.  ------------------------------------------------------------------- Aorta:  Aortic root: The aortic root was normal in size.  ------------------------------------------------------------------- Mitral valve:   Calcified annulus. Mobility was not restricted. Doppler:  Transvalvular velocity was within the normal range. There was no evidence for stenosis. There was mild regurgitation.    Peak gradient (D): 5 mm Hg.  ------------------------------------------------------------------- Left atrium:  The atrium was severely  dilated.  ------------------------------------------------------------------- Atrial septum:  A patent foramen ovale cannot be excluded.  ------------------------------------------------------------------- Right ventricle:  The cavity size was normal. Wall thickness was normal. Systolic function was normal.  ------------------------------------------------------------------- Pulmonic valve:    Structurally normal valve.   Cusp separation was normal.  Doppler:  Transvalvular velocity was within the normal range. There was no evidence for stenosis. There was trivial regurgitation.  ------------------------------------------------------------------- Tricuspid valve:   Structurally normal valve.    Doppler: Transvalvular velocity was within the normal range. There was mild regurgitation.  ------------------------------------------------------------------- Pulmonary artery:   The main pulmonary artery was normal-sized. Systolic pressure was within the normal range.  ------------------------------------------------------------------- Right atrium:  The atrium was normal in size.  ------------------------------------------------------------------- Pericardium:  There was no pericardial effusion.  ------------------------------------------------------------------- Systemic veins: Inferior vena cava: The vessel was normal in size. The respirophasic diameter changes were in the normal range (>= 50%), consistent with normal central venous pressure.  ------------------------------------------------------------------- Measurements   Left ventricle                           Value           Reference  LV ID, ED, PLAX chordal          (L)  40.5   mm       43 - 52  LV ID, ES, PLAX chordal                  28.2   mm       23 - 38  LV fx shortening, PLAX chordal           30     %        >=29  LV PW thickness, ED                      15.8   mm       ---------  IVS/LV PW ratio,  ED                      1.09            <=1.3  Stroke volume, 2D                        52     ml       ---------  Stroke volume/bsa, 2D                    25     ml/m^2   ---------  LV e&', lateral                           11.77  cm/s     ---------  LV E/e&', lateral                         9.55            ---------  LV e&', medial                            13.34  cm/s     ---------  LV E/e&', medial                          8.42            ---------  LV e&', average                           12.56  cm/s     ---------  LV E/e&', average                         8.95            ---------    Ventricular septum                       Value           Reference  IVS thickness, ED                        17.2   mm       ---------    LVOT                                     Value           Reference  LVOT ID, S  22     mm       ---------  LVOT area                                3.8    cm^2     ---------  LVOT peak velocity, S                    69.4   cm/s     ---------  LVOT mean velocity, S                    44.6   cm/s     ---------  LVOT VTI, S                              13.6   cm       ---------    Aortic valve                             Value           Reference  Aortic valve peak velocity, S            275    cm/s     ---------  Aortic valve mean velocity, S            196    cm/s     ---------  Aortic valve VTI, S                      58.5   cm       ---------  Aortic mean gradient, S                  18     mm Hg    ---------  Aortic peak gradient, S                  30     mm Hg    ---------  VTI ratio, LVOT/AV                       0.23            ---------  Aortic valve area, VTI                   0.88   cm^2     ---------  Aortic valve area/bsa, VTI               0.42   cm^2/m^2 ---------  Velocity ratio, peak, LVOT/AV            0.25            ---------  Aortic valve area, peak velocity         0.96   cm^2     ---------  Aortic valve area/bsa,  peak              0.46   cm^2/m^2 ---------  velocity  Velocity ratio, mean, LVOT/AV            0.23            ---------  Aortic valve area, mean velocity         0.86   cm^2     ---------  Aortic valve area/bsa, mean  0.41   cm^2/m^2 ---------  velocity    Aorta                                    Value           Reference  Aortic root ID, ED                       38     mm       ---------    Left atrium                              Value           Reference  LA ID, A-P, ES                           44     mm       ---------  LA ID/bsa, A-P                           2.11   cm/m^2   <=2.2  LA volume, S                             96.8   ml       ---------  LA volume/bsa, S                         46.4   ml/m^2   ---------  LA volume, ES, 1-p A4C                   103    ml       ---------  LA volume/bsa, ES, 1-p A4C               49.4   ml/m^2   ---------  LA volume, ES, 1-p A2C                   91.8   ml       ---------  LA volume/bsa, ES, 1-p A2C               44.1   ml/m^2   ---------    Mitral valve                             Value           Reference  Mitral E-wave peak velocity              112.35 cm/s     ---------  Mitral peak gradient, D                  5      mm Hg    ---------    Tricuspid valve                          Value           Reference  Tricuspid regurg peak velocity           193.39 cm/s     ---------  Tricuspid peak RV-RA  gradient            15     mm Hg    ---------  Tricuspid maximal regurg                 193.39 cm/s     ---------  velocity, PISA  Legend: (L)  and  (H)  mark values outside specified reference range.  ------------------------------------------------------------------- Prepared and Electronically Authenticated by  Skeet Latch, MD 2017-09-19T11:05:47   Assessment/Plan: S/P Procedure(s) (LRB): Left Heart Cath and Coronary Angiography (N/A)   Plan CABG with left main and 3 vessel disease, mild As by echo, no  gradient at cath . Plan to proceed with high risk CABG due to age and pulmonary disease .  The goals risks and alternatives of the planned surgical procedure CABG  have been discussed with the patient in detail. The risks of the procedure including death, infection, stroke, myocardial infarction, bleeding, blood transfusion have all been discussed specifically.  I have quoted Barry Officer Sr. a 8 % of perioperative mortality and a complication rate as high as 50 %. The patient's questions have been answered.Randie Ewin Ciliberto Sr. is willing  to proceed with the planned procedure.      [name] 11/28/2015 6:20 PM

## 2015-11-28 NOTE — Progress Notes (Signed)
  Echocardiogram 2D Echocardiogram with Definity has been performed.  Barry Mcdaniel 11/28/2015, 10:10 AM

## 2015-11-28 NOTE — Progress Notes (Signed)
[place] for Heparin Indication: critical 3v with left main CAD  Allergies  Allergen Reactions  . Penicillins Swelling    Eyes and lips Has patient had a PCN reaction causing immediate rash, facial/tongue/throat swelling, SOB or lightheadedness with hypotension: Yes Has patient had a PCN reaction causing severe rash involving mucus membranes or skin necrosis: No Has patient had a PCN reaction that required hospitalization No Has patient had a PCN reaction occurring within the last 10 years: No If all of the above answers are "NO", then may proceed with Cephalosporin use   . Prednisone Other (See Comments)    Hyperglycemia and AMS    Patient Measurements: Height: 5\' 11"  (180.3 cm) Weight: 192 lb (87.1 kg) IBW/kg (Calculated) : 75.3  Vital Signs: Temp: 98.8 F (37.1 C) (09/19 0400) Temp Source: Oral (09/19 0400) BP: 130/91 (09/19 0600) Pulse Rate: 83 (09/19 0600)  Labs:  Recent Labs  11/25/15 1833 11/25/15 2254 11/25/15 2309  11/26/15 0527 11/26/15 0725 11/26/15 1353 11/27/15 0537 11/27/15 0757 11/28/15 0543  HGB 12.4*  --   --   --  10.1*  --   --  9.6*  --  9.3*  HCT 40.1  --   --   --  32.5*  --   --  31.8*  --  29.8*  PLT 179  --   --   --  135*  --   --  124*  --  121*  LABPROT  --   --   --   --  15.5*  --   --   --   --   --   INR  --   --   --   --  1.23  --   --   --   --   --   HEPARINUNFRC  --   --   --   < > 0.81*  --  0.60 0.52  --  0.23*  CREATININE 1.37*  --  1.42*  --   --   --   --   --  1.14  --   TROPONINI  --  0.99*  --   --   --  1.57* 1.62*  --   --   --   < > = values in this interval not displayed.  Estimated Creatinine Clearance: 55 mL/min (by C-G formula based on SCr of 1.14 mg/dL).  Assessment: 82 y.o. male with NSTEMI, s/p cath, awaiting possible CABG, for heparin  Goal of Therapy:  Heparin level 0.3-0.7 units/ml Monitor platelets by anticoagulation protocol: Yes   Plan:  Increase Heparin  1300 unit/hr Check heparin level in 8 hours.   Caryl Pina 11/28/2015,6:26 AM

## 2015-11-28 NOTE — Plan of Care (Signed)
Problem: Phase I Progression Outcomes Goal: Initial discharge plan identified Outcome: Progressing Pt being worked up for CABG Goal: Voiding-avoid urinary catheter unless indicated Outcome: Progressing Pt remains incontinent at times. Using condom cath  Goal: Hemodynamically stable Outcome: Progressing HR and BP stable

## 2015-11-28 NOTE — Progress Notes (Signed)
Patient Name: Barry DUNSTAN Sr. Date of Encounter: 11/28/2015  Primary Cardiologist: Dr. Debbe Odea Problem List     Principal Problem:   NSTEMI (non-ST elevated myocardial infarction) [facility]) Active Problems:   Type II diabetes mellitus, uncontrolled (HCC)   Mitral regurgitation and aortic stenosis   Pure hypercholesterolemia   Hypothyroidism   Irregular heart beat   Iron deficiency anemia   Cough variant asthma   CKD (chronic kidney disease), stage III     Subjective   No pain thinks CABG will be Thursday   Inpatient Medications    . aspirin EC  81 mg Oral Daily  . finasteride  5 mg Oral Daily  . gabapentin  300 mg Oral BID  . insulin aspart  0-15 Units Subcutaneous TID WC  . insulin glargine  15 Units Subcutaneous Daily  . levothyroxine  112 mcg Oral QAC breakfast  . mouth rinse  15 mL Mouth Rinse BID  . mometasone-formoterol  2 puff Inhalation BID  . rosuvastatin  10 mg Oral Daily  . sodium chloride flush  3 mL Intravenous Q12H   Scheduled Meds: . aspirin EC  81 mg Oral Daily  . finasteride  5 mg Oral Daily  . gabapentin  300 mg Oral BID  . insulin aspart  0-15 Units Subcutaneous TID WC  . insulin glargine  15 Units Subcutaneous Daily  . levothyroxine  112 mcg Oral QAC breakfast  . mouth rinse  15 mL Mouth Rinse BID  . mometasone-formoterol  2 puff Inhalation BID  . rosuvastatin  10 mg Oral Daily  . sodium chloride flush  3 mL Intravenous Q12H   Continuous Infusions: . heparin 1,300 Units/hr (11/28/15 0628)  . nitroGLYCERIN 50 mcg/min (11/27/15 1800)   PRN Meds:.sodium chloride, acetaminophen, guaiFENesin-dextromethorphan, ondansetron (ZOFRAN) IV, oxyCODONE-acetaminophen, polyethylene glycol, polyvinyl alcohol, sodium chloride flush   Vital Signs    Vitals:   11/28/15 0545 11/28/15 0600 11/28/15 0615 11/28/15 0630  BP: 131/90 (!) 130/91 (!) 136/94 137/90  Pulse: 79 83 80 80  Resp: (!) 24 18 (!) 22 (!) 23  Temp:      TempSrc:      SpO2: 95%  98% 98% 96%  Weight:      Height:        Intake/Output Summary (Last 24 hours) at 11/28/15 0736 Last data filed at 11/28/15 0600  Gross per 24 hour  Intake          1241.88 ml  Output              610 ml  Net           631.88 ml   Filed Weights   11/25/15 1930 11/26/15 0549 11/28/15 0500  Weight: 85.7 kg (188 lb 15 oz) 85.7 kg (189 lb) 87.1 kg (192 lb)    Physical Exam    GEN: Well nourished, well developed, in no acute distress.  HEENT: Grossly normal.  Neck: Supple, no JVD, carotid bruits, or masses. Cardiac: RRR, 2/6 systolic ejection murmur right upper sternal border, no rubs, or gallops. No clubbing, cyanosis, edema.  Radials/DP/PT 2+ and equal bilaterally.  Respiratory:  Respirations regular and unlabored, clear to auscultation bilaterally. GI: Soft, nontender, nondistended, BS + x 4. Overweight MS: no deformity or atrophy. Skin: warm and dry, no rash. Neuro:  Strength and sensation are intact. Psych: AAOx3.  Normal affect.  Labs    CBC  Recent Labs  11/27/15 0537 11/28/15 0543  WBC 9.0 7.8  HGB  9.6* 9.3*  HCT 31.8* 29.8*  MCV 89.3 88.2  PLT 124* 123XX123*   Basic Metabolic Panel  Recent Labs  11/25/15 2309 11/27/15 0757  NA 141 141  K 5.4* 4.2  CL 113* 112*  CO2 19* 21*  GLUCOSE 210* 102*  BUN 24* 16  CREATININE 1.42* 1.14  CALCIUM 9.0 8.2*   Liver Function Tests No results for input(s): AST, ALT, ALKPHOS, BILITOT, PROT, ALBUMIN in the last 72 hours. No results for input(s): LIPASE, AMYLASE in the last 72 hours. Cardiac Enzymes  Recent Labs  11/25/15 2254 11/26/15 0725 11/26/15 1353  TROPONINI 0.99* 1.57* 1.62*   BNP Invalid input(s): POCBNP D-Dimer No results for input(s): DDIMER in the last 72 hours. Hemoglobin A1C No results for input(s): HGBA1C in the last 72 hours. Fasting Lipid Panel No results for input(s): CHOL, HDL, LDLCALC, TRIG, CHOLHDL, LDLDIRECT in the last 72 hours. Thyroid Function Tests  Recent Labs  11/26/15 0527    TSH 0.873    Telemetry    No adverse arrhythmias, personally viewed  ECG    11/26/15-significant Mobitz type I, right bundle branch block, left anterior fascicular block, bifascicular block, poor R-wave progression, personally viewed  Radiology    No results found. Echo 12/11/12 Study Conclusions  - Left ventricle: The cavity size was normal. Wall thickness was increased in a pattern of moderate LVH. Systolic function was normal. The estimated ejection fraction was in the range of 55% to 65%. Wall motion was normal; there were no regional wall motion abnormalities. Doppler parameters are consistent with abnormal left ventricular relaxation (grade 1 diastolic dysfunction). - Aortic valve: There was mild stenosis. - Mitral valve: Calcified annulus. Mildly thickened leaflets . Mild regurgitation. - Left atrium: The atrium was mildly dilated.  ETT: 09/14/14 Max HR 117 no ischemia or high grade HB  Inconclusive Lexiscan 2014   Patient Profile     82 year old male with non-ST elevation myocardial infarction, insulin-dependent diabetes, hyperlipidemia, hypertension, hypothyroidism, strong family history of CAD, mild to moderate aortic stenosis, untreated obstructive sleep apnea, chronic right bundle branch block, first-degree AV block, left anterior fascicular block.  Assessment & Plan    Non-ST elevation myocardial infarction  - LM 3VD seen by DrGerhardt  CABG Thursday keep in unit on iv heparin and nitro   Bifascicular block, Mobitz type I  - Significant conduction abnormality seen on EKG, avoiding Beta blockers. No syncope. This am telemetry with 3:2 wenkebach   Chronic kidney disease stage III/diabetic nephropathy -  Lab Results  Component Value Date   CREATININE 1.14 11/27/2015   BUN 16 11/27/2015   NA 141 11/27/2015   K 4.2 11/27/2015   CL 112 (H) 11/27/2015   CO2 21 (L) 11/27/2015    Hyperlipidemia  - LDL 53. Continue Crestor.  Mild anemia  - 12.4 ->10.1,  platelets 135 down from 179. Continue to monitor. No active signs of bleeding.  Diabetes with nephropathy  - Insulin.  - Dr. Dwyane Dee  Signed, Jenkins Rouge, MD  11/28/2015, 7:36 AM

## 2015-11-28 NOTE — Progress Notes (Signed)
ANTICOAGULATION CONSULT NOTE - Follow Up Consult  Pharmacy Consult for Heparin Indication: Critical 3 vessel disease with L-main CAD  Allergies  Allergen Reactions  . Penicillins Swelling    Eyes and lips Has patient had a PCN reaction causing immediate rash, facial/tongue/throat swelling, SOB or lightheadedness with hypotension: Yes Has patient had a PCN reaction causing severe rash involving mucus membranes or skin necrosis: No Has patient had a PCN reaction that required hospitalization No Has patient had a PCN reaction occurring within the last 10 years: No If all of the above answers are "NO", then may proceed with Cephalosporin use   . Prednisone Other (See Comments)    Hyperglycemia and AMS    Patient Measurements: Height: 5\' 11"  (180.3 cm) Weight: 192 lb (87.1 kg) IBW/kg (Calculated) : 75.3   Vital Signs: Temp: 98.5 F (36.9 C) (09/19 1100) Temp Source: Oral (09/19 1100) BP: 116/80 (09/19 1100) Pulse Rate: 80 (09/19 1100)  Labs:  Recent Labs  11/25/15 2254 11/25/15 2309  11/26/15 0527 11/26/15 0725 11/26/15 1353 11/27/15 0537 11/27/15 0757 11/28/15 0543 11/28/15 0931 11/28/15 1400  HGB  --   --   --  10.1*  --   --  9.6*  --  9.3*  --   --   HCT  --   --   --  32.5*  --   --  31.8*  --  29.8*  --   --   PLT  --   --   --  135*  --   --  124*  --  121*  --   --   APTT  --   --   --   --   --   --   --   --   --  80*  --   LABPROT  --   --   --  15.5*  --   --   --   --   --  15.5*  --   INR  --   --   --  1.23  --   --   --   --   --  1.22  --   HEPARINUNFRC  --   --   < > 0.81*  --  0.60 0.52  --  0.23*  --  0.30  CREATININE  --  1.42*  --   --   --   --   --  1.14  --  1.12  --   TROPONINI 0.99*  --   --   --  1.57* 1.62*  --   --   --   --   --   < > = values in this interval not displayed.  Estimated Creatinine Clearance: 56 mL/min (by C-G formula based on SCr of 1.12 mg/dL).  Assessment: 82yo male on heparin 1300 units/hr, for CABG in AM.   Heparin level is just therapeutic.  No problems with IV pump and no bleeding.  Will bump Heparin rate a bit to maintain therapeutic range.  Goal of Therapy:  Heparin level 0.3-0.7 units/ml Monitor platelets by anticoagulation protocol: Yes   Plan:  Increase heparin to 1350 units/hr Watch for s/s of bleeding F/U after OR on [place], PharmD Clinical Pharmacist [facility]

## 2015-11-29 ENCOUNTER — Encounter (HOSPITAL_COMMUNITY): Payer: Self-pay

## 2015-11-29 ENCOUNTER — Inpatient Hospital Stay (HOSPITAL_COMMUNITY): Payer: Medicare Other | Admitting: Certified Registered Nurse Anesthetist

## 2015-11-29 ENCOUNTER — Encounter (HOSPITAL_COMMUNITY)
Admission: EM | Disposition: A | Discharge status: Home Health | Payer: Self-pay | Source: Home / Self Care | Attending: Cardiothoracic Surgery

## 2015-11-29 ENCOUNTER — Inpatient Hospital Stay (HOSPITAL_COMMUNITY): Payer: Medicare Other

## 2015-11-29 DIAGNOSIS — Z951 Presence of aortocoronary bypass graft: Secondary | ICD-10-CM

## 2015-11-29 HISTORY — PX: INTRAOPERATIVE TRANSESOPHAGEAL ECHOCARDIOGRAM: SHX5062

## 2015-11-29 HISTORY — PX: CORONARY ARTERY BYPASS GRAFT: SHX141

## 2015-11-29 LAB — URINALYSIS W MICROSCOPIC (NOT AT ARMC)
BACTERIA UA: NONE SEEN
Bilirubin Urine: NEGATIVE
GLUCOSE, UA: NEGATIVE mg/dL
HGB URINE DIPSTICK: NEGATIVE
KETONES UR: NEGATIVE mg/dL
Leukocytes, UA: NEGATIVE
Nitrite: NEGATIVE
PROTEIN: NEGATIVE mg/dL
Specific Gravity, Urine: 1.015 (ref 1.005–1.030)
WBC UA: NONE SEEN WBC/hpf (ref 0–5)
pH: 5.5 (ref 5.0–8.0)

## 2015-11-29 LAB — POCT I-STAT, CHEM 8
BUN: 13 mg/dL (ref 6–20)
BUN: 12 mg/dL (ref 6–20)
BUN: 12 mg/dL (ref 6–20)
BUN: 12 mg/dL (ref 6–20)
BUN: 13 mg/dL (ref 6–20)
BUN: 12 mg/dL (ref 6–20)
BUN: 11 mg/dL (ref 6–20)
CALCIUM ION: 1.23 mmol/L (ref 1.15–1.40)
CALCIUM ION: 1.22 mmol/L (ref 1.15–1.40)
CHLORIDE: 105 mmol/L (ref 101–111)
CHLORIDE: 106 mmol/L (ref 101–111)
CHLORIDE: 108 mmol/L (ref 101–111)
CREATININE: 0.8 mg/dL (ref 0.61–1.24)
CREATININE: 0.9 mg/dL (ref 0.61–1.24)
Calcium, Ion: 1.04 mmol/L — ABNORMAL LOW (ref 1.15–1.40)
Calcium, Ion: 1.08 mmol/L — ABNORMAL LOW (ref 1.15–1.40)
Calcium, Ion: 1.1 mmol/L — ABNORMAL LOW (ref 1.15–1.40)
Calcium, Ion: 1.15 mmol/L (ref 1.15–1.40)
Calcium, Ion: 1.18 mmol/L (ref 1.15–1.40)
Chloride: 107 mmol/L (ref 101–111)
Chloride: 107 mmol/L (ref 101–111)
Chloride: 106 mmol/L (ref 101–111)
Chloride: 108 mmol/L (ref 101–111)
Creatinine, Ser: 0.8 mg/dL (ref 0.61–1.24)
Creatinine, Ser: 0.8 mg/dL (ref 0.61–1.24)
Creatinine, Ser: 0.9 mg/dL (ref 0.61–1.24)
Creatinine, Ser: 0.9 mg/dL (ref 0.61–1.24)
Creatinine, Ser: 0.8 mg/dL (ref 0.61–1.24)
GLUCOSE: 153 mg/dL — AB (ref 65–99)
GLUCOSE: 112 mg/dL — AB (ref 65–99)
GLUCOSE: 136 mg/dL — AB (ref 65–99)
Glucose, Bld: 144 mg/dL — ABNORMAL HIGH (ref 65–99)
Glucose, Bld: 146 mg/dL — ABNORMAL HIGH (ref 65–99)
Glucose, Bld: 120 mg/dL — ABNORMAL HIGH (ref 65–99)
Glucose, Bld: 161 mg/dL — ABNORMAL HIGH (ref 65–99)
HCT: 29 % — ABNORMAL LOW (ref 39.0–52.0)
HCT: 25 % — ABNORMAL LOW (ref 39.0–52.0)
HCT: 30 % — ABNORMAL LOW (ref 39.0–52.0)
HEMATOCRIT: 23 % — AB (ref 39.0–52.0)
HEMATOCRIT: 25 % — AB (ref 39.0–52.0)
HEMATOCRIT: 29 % — AB (ref 39.0–52.0)
HEMATOCRIT: 32 % — AB (ref 39.0–52.0)
HEMOGLOBIN: 9.9 g/dL — AB (ref 13.0–17.0)
HEMOGLOBIN: 8.5 g/dL — AB (ref 13.0–17.0)
HEMOGLOBIN: 9.9 g/dL — AB (ref 13.0–17.0)
HEMOGLOBIN: 10.2 g/dL — AB (ref 13.0–17.0)
Hemoglobin: 7.8 g/dL — ABNORMAL LOW (ref 13.0–17.0)
Hemoglobin: 8.5 g/dL — ABNORMAL LOW (ref 13.0–17.0)
Hemoglobin: 10.9 g/dL — ABNORMAL LOW (ref 13.0–17.0)
POTASSIUM: 4 mmol/L (ref 3.5–5.1)
POTASSIUM: 4.7 mmol/L (ref 3.5–5.1)
Potassium: 4.2 mmol/L (ref 3.5–5.1)
Potassium: 4.6 mmol/L (ref 3.5–5.1)
Potassium: 4.9 mmol/L (ref 3.5–5.1)
Potassium: 4.5 mmol/L (ref 3.5–5.1)
Potassium: 4.7 mmol/L (ref 3.5–5.1)
SODIUM: 142 mmol/L (ref 135–145)
SODIUM: 140 mmol/L (ref 135–145)
SODIUM: 141 mmol/L (ref 135–145)
SODIUM: 140 mmol/L (ref 135–145)
Sodium: 139 mmol/L (ref 135–145)
Sodium: 142 mmol/L (ref 135–145)
Sodium: 142 mmol/L (ref 135–145)
TCO2: 26 mmol/L (ref 0–100)
TCO2: 23 mmol/L (ref 0–100)
TCO2: 26 mmol/L (ref 0–100)
TCO2: 27 mmol/L (ref 0–100)
TCO2: 24 mmol/L (ref 0–100)
TCO2: 23 mmol/L (ref 0–100)
TCO2: 22 mmol/L (ref 0–100)

## 2015-11-29 LAB — POCT I-STAT 3, ART BLOOD GAS (G3+)
Acid-base deficit: 4 mmol/L — ABNORMAL HIGH (ref 0.0–2.0)
Acid-base deficit: 4 mmol/L — ABNORMAL HIGH (ref 0.0–2.0)
Bicarbonate: 25 mmol/L (ref 20.0–28.0)
Bicarbonate: 21.2 mmol/L (ref 20.0–28.0)
Bicarbonate: 21 mmol/L (ref 20.0–28.0)
O2 SAT: 100 %
O2 SAT: 92 %
O2 SAT: 98 %
PCO2 ART: 43.7 mmHg (ref 32.0–48.0)
PCO2 ART: 37.8 mmHg (ref 32.0–48.0)
PCO2 ART: 38.2 mmHg (ref 32.0–48.0)
PH ART: 7.365 (ref 7.350–7.450)
PH ART: 7.352 (ref 7.350–7.450)
PO2 ART: 117 mmHg — AB (ref 83.0–108.0)
Patient temperature: 37
TCO2: 26 mmol/L (ref 0–100)
TCO2: 22 mmol/L (ref 0–100)
TCO2: 22 mmol/L (ref 0–100)
pH, Arterial: 7.348 — ABNORMAL LOW (ref 7.350–7.450)
pO2, Arterial: 401 mmHg — ABNORMAL HIGH (ref 83.0–108.0)
pO2, Arterial: 65 mmHg — ABNORMAL LOW (ref 83.0–108.0)

## 2015-11-29 LAB — CBC
HCT: 30.7 % — ABNORMAL LOW (ref 39.0–52.0)
HCT: 38 % — ABNORMAL LOW (ref 39.0–52.0)
HCT: 35.2 % — ABNORMAL LOW (ref 39.0–52.0)
Hemoglobin: 9.6 g/dL — ABNORMAL LOW (ref 13.0–17.0)
Hemoglobin: 11.9 g/dL — ABNORMAL LOW (ref 13.0–17.0)
Hemoglobin: 11 g/dL — ABNORMAL LOW (ref 13.0–17.0)
MCH: 27.6 pg (ref 26.0–34.0)
MCH: 27.4 pg (ref 26.0–34.0)
MCH: 27.3 pg (ref 26.0–34.0)
MCHC: 31.3 g/dL (ref 30.0–36.0)
MCHC: 31.3 g/dL (ref 30.0–36.0)
MCHC: 31.3 g/dL (ref 30.0–36.0)
MCV: 88.2 fL (ref 78.0–100.0)
MCV: 87.6 fL (ref 78.0–100.0)
MCV: 87.3 fL (ref 78.0–100.0)
PLATELETS: 125 10*3/uL — AB (ref 150–400)
Platelets: 81 10*3/uL — ABNORMAL LOW (ref 150–400)
Platelets: 86 10*3/uL — ABNORMAL LOW (ref 150–400)
RBC: 3.48 MIL/uL — ABNORMAL LOW (ref 4.22–5.81)
RBC: 4.34 MIL/uL (ref 4.22–5.81)
RBC: 4.03 MIL/uL — ABNORMAL LOW (ref 4.22–5.81)
RDW: 15 % (ref 11.5–15.5)
RDW: 14.6 % (ref 11.5–15.5)
RDW: 14.8 % (ref 11.5–15.5)
WBC: 6.7 10*3/uL (ref 4.0–10.5)
WBC: 12.8 10*3/uL — AB (ref 4.0–10.5)
WBC: 11.5 10*3/uL — ABNORMAL HIGH (ref 4.0–10.5)

## 2015-11-29 LAB — VAS US DOPPLER PRE CABG
LEFT ECA DIAS: -4 cm/s
LEFT VERTEBRAL DIAS: -11 cm/s
Left CCA dist dias: 20 cm/s
Left CCA dist sys: 89 cm/s
Left CCA prox dias: 13 cm/s
Left CCA prox sys: 92 cm/s
Left ICA dist dias: -27 cm/s
Left ICA dist sys: -67 cm/s
Left ICA prox dias: 30 cm/s
Left ICA prox sys: 114 cm/s
RIGHT VERTEBRAL DIAS: -13 cm/s
Right CCA prox dias: 18 cm/s
Right CCA prox sys: 86 cm/s
Right cca dist sys: -184 cm/s

## 2015-11-29 LAB — BASIC METABOLIC PANEL
Anion gap: 6 (ref 5–15)
BUN: 12 mg/dL (ref 6–20)
CO2: 22 mmol/L (ref 22–32)
Calcium: 8.4 mg/dL — ABNORMAL LOW (ref 8.9–10.3)
Chloride: 112 mmol/L — ABNORMAL HIGH (ref 101–111)
Creatinine, Ser: 1.12 mg/dL (ref 0.61–1.24)
GFR calc Af Amer: >60 mL/min (ref >60)
GFR calc non Af Amer: >60 mL/min (ref >60)
Glucose, Bld: 187 mg/dL — ABNORMAL HIGH (ref 65–99)
Potassium: 4.5 mmol/L (ref 3.5–5.1)
Sodium: 140 mmol/L (ref 135–145)

## 2015-11-29 LAB — PROTIME-INR
INR: 1.59
Prothrombin Time: 19.1 seconds — ABNORMAL HIGH (ref 11.4–15.2)

## 2015-11-29 LAB — CREATININE, SERUM
Creatinine, Ser: 0.91 mg/dL (ref 0.61–1.24)
GFR calc Af Amer: >60 mL/min (ref >60)
GFR calc non Af Amer: >60 mL/min (ref >60)

## 2015-11-29 LAB — GLUCOSE, CAPILLARY
GLUCOSE-CAPILLARY: 173 mg/dL — AB (ref 65–99)
GLUCOSE-CAPILLARY: 97 mg/dL (ref 65–99)
GLUCOSE-CAPILLARY: 91 mg/dL (ref 65–99)
GLUCOSE-CAPILLARY: 115 mg/dL — AB (ref 65–99)
GLUCOSE-CAPILLARY: 132 mg/dL — AB (ref 65–99)
GLUCOSE-CAPILLARY: 119 mg/dL — AB (ref 65–99)
Glucose-Capillary: 107 mg/dL — ABNORMAL HIGH (ref 65–99)
Glucose-Capillary: 137 mg/dL — ABNORMAL HIGH (ref 65–99)

## 2015-11-29 LAB — POCT I-STAT 4, (NA,K, GLUC, HGB,HCT)
GLUCOSE: 123 mg/dL — AB (ref 65–99)
HEMATOCRIT: 35 % — AB (ref 39.0–52.0)
Hemoglobin: 11.9 g/dL — ABNORMAL LOW (ref 13.0–17.0)
Potassium: 4.1 mmol/L (ref 3.5–5.1)
Sodium: 144 mmol/L (ref 135–145)

## 2015-11-29 LAB — PREPARE RBC (CROSSMATCH)

## 2015-11-29 LAB — PLATELET COUNT: Platelets: 74 10*3/uL — ABNORMAL LOW (ref 150–400)

## 2015-11-29 LAB — CARBOXYHEMOGLOBIN
Carboxyhemoglobin: 0.7 % (ref 0.5–1.5)
Methemoglobin: 1.3 % (ref 0.0–1.5)
O2 Saturation: 63.1 %
Total hemoglobin: 12.4 g/dL (ref 12.0–16.0)

## 2015-11-29 LAB — MAGNESIUM: Magnesium: 2.7 mg/dL — ABNORMAL HIGH (ref 1.7–2.4)

## 2015-11-29 LAB — HEMOGLOBIN AND HEMATOCRIT, BLOOD
HCT: 27.2 % — ABNORMAL LOW (ref 39.0–52.0)
Hemoglobin: 8.7 g/dL — ABNORMAL LOW (ref 13.0–17.0)

## 2015-11-29 LAB — APTT: APTT: 33 s (ref 24–36)

## 2015-11-29 SURGERY — CORONARY ARTERY BYPASS GRAFTING (CABG)
Anesthesia: General | Site: Chest

## 2015-11-29 MED ORDER — LACTATED RINGERS IV SOLN: INTRAVENOUS | Status: DC

## 2015-11-29 MED ORDER — ROCURONIUM BROMIDE 100 MG/10ML IV SOLN
INTRAVENOUS | Status: DC | PRN
Start: 2015-11-29 — End: 2015-11-29

## 2015-11-29 MED ORDER — 0.9 % SODIUM CHLORIDE (POUR BTL) OPTIME
TOPICAL | Status: DC | PRN
  Administered 2015-11-29: 1000 mL
  Administered 2015-11-29: 6000 mL

## 2015-11-29 MED ORDER — PROPOFOL 10 MG/ML IV BOLUS
INTRAVENOUS | Status: AC
  Filled 2015-11-29: qty 20

## 2015-11-29 MED ORDER — PHENYLEPHRINE HCL 10 MG/ML IJ SOLN
0.0000 ug/min | INTRAVENOUS | Status: DC
  Filled 2015-11-29 (×2): qty 2

## 2015-11-29 MED ORDER — SODIUM CHLORIDE 0.9 % IV SOLN
INTRAVENOUS | Status: DC
  Administered 2015-11-29: 10 mL/h via INTRAVENOUS

## 2015-11-29 MED ORDER — NITROGLYCERIN IN D5W 200-5 MCG/ML-% IV SOLN
0.0000 ug/min | INTRAVENOUS | Status: DC
  Administered 2015-11-30: 45 ug/min via INTRAVENOUS
  Filled 2015-11-29: qty 250

## 2015-11-29 MED ORDER — POTASSIUM CHLORIDE 10 MEQ/50ML IV SOLN
10.0000 meq | INTRAVENOUS | Status: AC
  Administered 2015-11-29 (×3): 10 meq via INTRAVENOUS

## 2015-11-29 MED ORDER — LACTATED RINGERS IV SOLN: 500.0000 mL | Freq: Once | INTRAVENOUS | Status: DC | PRN

## 2015-11-29 MED ORDER — LEVOFLOXACIN IN D5W 750 MG/150ML IV SOLN
750.0000 mg | INTRAVENOUS | Status: AC
  Administered 2015-11-30: 750 mg via INTRAVENOUS
  Filled 2015-11-29: qty 150

## 2015-11-29 MED ORDER — LEVALBUTEROL HCL 0.63 MG/3ML IN NEBU
0.6300 mg | INHALATION_SOLUTION | Freq: Three times a day (TID) | RESPIRATORY_TRACT | Status: DC
  Administered 2015-11-29 – 2015-12-04 (×15): 0.63 mg via RESPIRATORY_TRACT
  Filled 2015-11-29 (×14): qty 3

## 2015-11-29 MED ORDER — ACETYLCYSTEINE 10 % IN SOLN
2.0000 mL | Freq: Three times a day (TID) | RESPIRATORY_TRACT | Status: DC
  Filled 2015-11-29: qty 2

## 2015-11-29 MED ORDER — THROMBIN 5000 UNITS EX SOLR
CUTANEOUS | Status: AC
  Filled 2015-11-29: qty 5000

## 2015-11-29 MED ORDER — METOPROLOL TARTRATE 12.5 MG HALF TABLET
12.5000 mg | ORAL_TABLET | Freq: Two times a day (BID) | ORAL | Status: DC
  Administered 2015-11-30 (×2): 12.5 mg via ORAL
  Filled 2015-11-29 (×3): qty 1

## 2015-11-29 MED ORDER — SUCCINYLCHOLINE CHLORIDE 200 MG/10ML IV SOSY
PREFILLED_SYRINGE | INTRAVENOUS | Status: AC
  Filled 2015-11-29: qty 10

## 2015-11-29 MED ORDER — ACETAMINOPHEN 160 MG/5ML PO SOLN: 650.0000 mg | Freq: Once | ORAL | Status: AC

## 2015-11-29 MED ORDER — ACETYLCYSTEINE 20 % IN SOLN
2.0000 mL | Freq: Three times a day (TID) | RESPIRATORY_TRACT | Status: DC
  Administered 2015-11-29: 2 mL via RESPIRATORY_TRACT
  Filled 2015-11-29: qty 4

## 2015-11-29 MED ORDER — PROTAMINE SULFATE 10 MG/ML IV SOLN
INTRAVENOUS | Status: DC | PRN
  Administered 2015-11-29: 35 mg via INTRAVENOUS

## 2015-11-29 MED ORDER — CHLORHEXIDINE GLUCONATE 4 % EX LIQD
CUTANEOUS | Status: AC
  Administered 2015-11-29: 04:00:00
  Filled 2015-11-29: qty 60

## 2015-11-29 MED ORDER — ASPIRIN EC 325 MG PO TBEC
325.0000 mg | DELAYED_RELEASE_TABLET | Freq: Every day | ORAL | Status: DC
  Administered 2015-11-30 – 2015-12-03 (×4): 325 mg via ORAL
  Filled 2015-11-29 (×4): qty 1

## 2015-11-29 MED ORDER — HEMOSTATIC AGENTS (NO CHARGE) OPTIME
TOPICAL | Status: DC | PRN
  Administered 2015-11-29 (×2): 1 via TOPICAL

## 2015-11-29 MED ORDER — TRAMADOL HCL 50 MG PO TABS
50.0000 mg | ORAL_TABLET | ORAL | Status: DC | PRN
  Administered 2015-11-30 – 2015-12-01 (×4): 50 mg via ORAL
  Filled 2015-11-29 (×4): qty 1

## 2015-11-29 MED ORDER — MORPHINE SULFATE (PF) 2 MG/ML IV SOLN
1.0000 mg | INTRAVENOUS | Status: DC | PRN
  Filled 2015-11-29: qty 1

## 2015-11-29 MED ORDER — DOCUSATE SODIUM 100 MG PO CAPS
200.0000 mg | ORAL_CAPSULE | Freq: Every day | ORAL | Status: DC
  Administered 2015-11-30 – 2015-12-02 (×3): 200 mg via ORAL
  Filled 2015-11-29 (×3): qty 2

## 2015-11-29 MED ORDER — MAGNESIUM SULFATE 4 GM/100ML IV SOLN
4.0000 g | Freq: Once | INTRAVENOUS | Status: AC
  Administered 2015-11-29: 4 g via INTRAVENOUS
  Filled 2015-11-29: qty 100

## 2015-11-29 MED ORDER — SODIUM CHLORIDE 0.9 % IV SOLN: Freq: Once | INTRAVENOUS | Status: DC

## 2015-11-29 MED ORDER — DEXMEDETOMIDINE HCL IN NACL 200 MCG/50ML IV SOLN
0.0000 ug/kg/h | INTRAVENOUS | Status: DC
  Administered 2015-11-30: 0.2 ug/kg/h via INTRAVENOUS
  Filled 2015-11-29 (×2): qty 50

## 2015-11-29 MED ORDER — ROCURONIUM BROMIDE 10 MG/ML (PF) SYRINGE
PREFILLED_SYRINGE | INTRAVENOUS | Status: AC
  Filled 2015-11-29: qty 10

## 2015-11-29 MED ORDER — PHENYLEPHRINE 40 MCG/ML (10ML) SYRINGE FOR IV PUSH (FOR BLOOD PRESSURE SUPPORT)
PREFILLED_SYRINGE | INTRAVENOUS | Status: AC
  Filled 2015-11-29: qty 10

## 2015-11-29 MED ORDER — FENTANYL CITRATE (PF) 250 MCG/5ML IJ SOLN
INTRAMUSCULAR | Status: AC
  Filled 2015-11-29: qty 25

## 2015-11-29 MED ORDER — LIDOCAINE 2% (20 MG/ML) 5 ML SYRINGE
INTRAMUSCULAR | Status: AC
  Filled 2015-11-29: qty 5

## 2015-11-29 MED ORDER — PROTAMINE SULFATE 10 MG/ML IV SOLN
INTRAVENOUS | Status: AC
  Filled 2015-11-29: qty 25

## 2015-11-29 MED ORDER — ACETYLCYSTEINE 20 % IN SOLN
2.0000 mL | Freq: Three times a day (TID) | RESPIRATORY_TRACT | Status: DC
  Administered 2015-11-29: 4 mL via RESPIRATORY_TRACT
  Administered 2015-11-30: 2 mL via RESPIRATORY_TRACT
  Filled 2015-11-29: qty 4

## 2015-11-29 MED ORDER — SODIUM CHLORIDE 0.9 % IV SOLN
0.5000 g/h | Freq: Once | INTRAVENOUS | Status: DC
  Filled 2015-11-29: qty 20

## 2015-11-29 MED ORDER — LACTATED RINGERS IV SOLN
INTRAVENOUS | Status: DC | PRN
  Administered 2015-11-29: 08:00:00 via INTRAVENOUS

## 2015-11-29 MED ORDER — MIDAZOLAM HCL 10 MG/2ML IJ SOLN
INTRAMUSCULAR | Status: AC
  Filled 2015-11-29: qty 2

## 2015-11-29 MED ORDER — VANCOMYCIN HCL IN DEXTROSE 1-5 GM/200ML-% IV SOLN
1000.0000 mg | Freq: Once | INTRAVENOUS | Status: AC
  Administered 2015-11-29: 1000 mg via INTRAVENOUS
  Filled 2015-11-29: qty 200

## 2015-11-29 MED ORDER — ARTIFICIAL TEARS OP OINT
TOPICAL_OINTMENT | OPHTHALMIC | Status: DC | PRN
  Administered 2015-11-29: 1 via OPHTHALMIC

## 2015-11-29 MED ORDER — MORPHINE SULFATE (PF) 2 MG/ML IV SOLN
2.0000 mg | INTRAVENOUS | Status: DC | PRN
  Administered 2015-11-30 – 2015-12-01 (×3): 2 mg via INTRAVENOUS
  Filled 2015-11-29 (×2): qty 1

## 2015-11-29 MED ORDER — MIDAZOLAM HCL 2 MG/2ML IJ SOLN
2.0000 mg | INTRAMUSCULAR | Status: DC | PRN
  Administered 2015-11-29: 2 mg via INTRAVENOUS
  Filled 2015-11-29: qty 2

## 2015-11-29 MED ORDER — SODIUM CHLORIDE 0.9% FLUSH: 3.0000 mL | INTRAVENOUS | Status: DC | PRN

## 2015-11-29 MED ORDER — DOPAMINE-DEXTROSE 3.2-5 MG/ML-% IV SOLN: 0.0000 ug/kg/min | INTRAVENOUS | Status: DC

## 2015-11-29 MED ORDER — METOPROLOL TARTRATE 5 MG/5ML IV SOLN: 2.5000 mg | INTRAVENOUS | Status: DC | PRN

## 2015-11-29 MED ORDER — DEXMEDETOMIDINE HCL IN NACL 400 MCG/100ML IV SOLN
0.4000 ug/kg/h | INTRAVENOUS | Status: DC
  Filled 2015-11-29: qty 100

## 2015-11-29 MED ORDER — ONDANSETRON HCL 4 MG/2ML IJ SOLN: 4.0000 mg | Freq: Four times a day (QID) | INTRAMUSCULAR | Status: DC | PRN

## 2015-11-29 MED ORDER — SODIUM CHLORIDE 0.9 % IV SOLN
INTRAVENOUS | Status: DC | PRN
  Administered 2015-11-29: 15:00:00 via INTRAVENOUS

## 2015-11-29 MED ORDER — SODIUM CHLORIDE 0.9% FLUSH
3.0000 mL | Freq: Two times a day (BID) | INTRAVENOUS | Status: DC
  Administered 2015-12-01 – 2015-12-03 (×4): 3 mL via INTRAVENOUS

## 2015-11-29 MED ORDER — METOPROLOL TARTRATE 25 MG/10 ML ORAL SUSPENSION
12.5000 mg | Freq: Two times a day (BID) | ORAL | Status: DC
  Administered 2015-11-29 – 2015-12-01 (×2): 12.5 mg
  Filled 2015-11-29 (×2): qty 5

## 2015-11-29 MED ORDER — ORAL CARE MOUTH RINSE
15.0000 mL | Freq: Four times a day (QID) | OROMUCOSAL | Status: DC
  Administered 2015-11-29 – 2015-11-30 (×4): 15 mL via OROMUCOSAL

## 2015-11-29 MED ORDER — ARTIFICIAL TEARS OP OINT
TOPICAL_OINTMENT | OPHTHALMIC | Status: AC
  Filled 2015-11-29: qty 3.5

## 2015-11-29 MED ORDER — SODIUM CHLORIDE 0.9 % IV SOLN
INTRAVENOUS | Status: DC
  Administered 2015-11-29: 21:00:00 via INTRAVENOUS
  Filled 2015-11-29 (×2): qty 2.5

## 2015-11-29 MED ORDER — BISACODYL 10 MG RE SUPP: 10.0000 mg | Freq: Every day | RECTAL | Status: DC

## 2015-11-29 MED ORDER — NITROGLYCERIN 0.2 MG/ML ON CALL CATH LAB
INTRAVENOUS | Status: DC | PRN
  Administered 2015-11-29 (×8): 10 ug via INTRAVENOUS

## 2015-11-29 MED ORDER — FENTANYL CITRATE (PF) 250 MCG/5ML IJ SOLN
INTRAMUSCULAR | Status: DC | PRN
  Administered 2015-11-29: 200 ug via INTRAVENOUS
  Administered 2015-11-29: 100 ug via INTRAVENOUS
  Administered 2015-11-29: 250 ug via INTRAVENOUS
  Administered 2015-11-29: 150 ug via INTRAVENOUS
  Administered 2015-11-29: 50 ug via INTRAVENOUS
  Administered 2015-11-29: 150 ug via INTRAVENOUS
  Administered 2015-11-29: 250 ug via INTRAVENOUS
  Administered 2015-11-29: 100 ug via INTRAVENOUS

## 2015-11-29 MED ORDER — PHENYLEPHRINE HCL 10 MG/ML IJ SOLN
INTRAMUSCULAR | Status: DC | PRN
  Administered 2015-11-29: 20 ug/min via INTRAVENOUS

## 2015-11-29 MED ORDER — CHLORHEXIDINE GLUCONATE 0.12% ORAL RINSE (MEDLINE KIT)
15.0000 mL | Freq: Two times a day (BID) | OROMUCOSAL | Status: DC
  Administered 2015-11-29 – 2015-11-30 (×2): 15 mL via OROMUCOSAL

## 2015-11-29 MED ORDER — MIDAZOLAM HCL 5 MG/5ML IJ SOLN
INTRAMUSCULAR | Status: DC | PRN
  Administered 2015-11-29: 5 mg via INTRAVENOUS
  Administered 2015-11-29: 1 mg via INTRAVENOUS
  Administered 2015-11-29: 3 mg via INTRAVENOUS
  Administered 2015-11-29: 1 mg via INTRAVENOUS

## 2015-11-29 MED ORDER — OXYCODONE HCL 5 MG PO TABS: 5.0000 mg | ORAL_TABLET | ORAL | Status: DC | PRN

## 2015-11-29 MED ORDER — BISACODYL 5 MG PO TBEC
10.0000 mg | DELAYED_RELEASE_TABLET | Freq: Every day | ORAL | Status: DC
  Administered 2015-11-30 – 2015-12-02 (×3): 10 mg via ORAL
  Filled 2015-11-29 (×3): qty 2

## 2015-11-29 MED ORDER — HEPARIN SODIUM (PORCINE) 1000 UNIT/ML IJ SOLN
INTRAMUSCULAR | Status: AC
  Filled 2015-11-29: qty 1

## 2015-11-29 MED ORDER — HEPARIN SODIUM (PORCINE) 1000 UNIT/ML IJ SOLN
INTRAMUSCULAR | Status: DC | PRN
  Administered 2015-11-29: 45000 [IU] via INTRAVENOUS
  Administered 2015-11-29: 5000 [IU] via INTRAVENOUS

## 2015-11-29 MED ORDER — THROMBIN 5000 UNITS EX SOLR
CUTANEOUS | Status: DC | PRN
  Administered 2015-11-29: 5000 [IU] via TOPICAL

## 2015-11-29 MED ORDER — ALBUMIN HUMAN 5 % IV SOLN
250.0000 mL | INTRAVENOUS | Status: AC | PRN
  Administered 2015-11-29 (×2): 250 mL via INTRAVENOUS

## 2015-11-29 MED ORDER — ASPIRIN 81 MG PO CHEW
324.0000 mg | CHEWABLE_TABLET | Freq: Every day | ORAL | Status: DC
Start: 2015-11-30 — End: 2015-12-04

## 2015-11-29 MED ORDER — ACETAMINOPHEN 500 MG PO TABS
1000.0000 mg | ORAL_TABLET | Freq: Four times a day (QID) | ORAL | Status: DC
  Administered 2015-11-30 – 2015-12-04 (×14): 1000 mg via ORAL
  Filled 2015-11-29 (×10): qty 2

## 2015-11-29 MED ORDER — FAMOTIDINE IN NACL 20-0.9 MG/50ML-% IV SOLN
20.0000 mg | Freq: Two times a day (BID) | INTRAVENOUS | Status: AC
  Administered 2015-11-29 (×2): 20 mg via INTRAVENOUS
  Filled 2015-11-29: qty 50

## 2015-11-29 MED ORDER — ACETAMINOPHEN 650 MG RE SUPP
650.0000 mg | Freq: Once | RECTAL | Status: AC
  Administered 2015-11-29: 650 mg via RECTAL

## 2015-11-29 MED ORDER — CHLORHEXIDINE GLUCONATE 0.12 % MT SOLN
15.0000 mL | OROMUCOSAL | Status: AC
  Administered 2015-11-29: 15 mL via OROMUCOSAL

## 2015-11-29 MED ORDER — PANTOPRAZOLE SODIUM 40 MG PO TBEC
40.0000 mg | DELAYED_RELEASE_TABLET | Freq: Every day | ORAL | Status: DC
  Administered 2015-11-30 – 2015-12-03 (×4): 40 mg via ORAL
  Filled 2015-11-29 (×4): qty 1

## 2015-11-29 MED ORDER — SODIUM CHLORIDE 0.45 % IV SOLN: INTRAVENOUS | Status: DC | PRN

## 2015-11-29 MED ORDER — SODIUM CHLORIDE 0.9 % IV SOLN: 250.0000 mL | INTRAVENOUS | Status: DC

## 2015-11-29 MED ORDER — ROCURONIUM BROMIDE 10 MG/ML (PF) SYRINGE
PREFILLED_SYRINGE | INTRAVENOUS | Status: DC | PRN
  Administered 2015-11-29 (×2): 100 mg via INTRAVENOUS
  Administered 2015-11-29: 50 mg via INTRAVENOUS

## 2015-11-29 MED ORDER — EPHEDRINE 5 MG/ML INJ
INTRAVENOUS | Status: AC
  Filled 2015-11-29: qty 10

## 2015-11-29 MED ORDER — PROPOFOL 10 MG/ML IV BOLUS
INTRAVENOUS | Status: DC | PRN
  Administered 2015-11-29: 50 mg via INTRAVENOUS
  Administered 2015-11-29: 30 mg via INTRAVENOUS

## 2015-11-29 MED ORDER — PLASMA-LYTE 148 IV SOLN
INTRAVENOUS | Status: DC | PRN
  Administered 2015-11-29: 500 mL via INTRAVASCULAR

## 2015-11-29 MED ORDER — ACETAMINOPHEN 160 MG/5ML PO SOLN
1000.0000 mg | Freq: Four times a day (QID) | ORAL | Status: DC
  Administered 2015-11-29 – 2015-11-30 (×2): 1000 mg
  Filled 2015-11-29 (×2): qty 40.6

## 2015-11-29 MED ORDER — DOPAMINE-DEXTROSE 3.2-5 MG/ML-% IV SOLN
INTRAVENOUS | Status: DC | PRN
Start: 2015-11-29 — End: 2015-11-29
  Administered 2015-11-29: 2 ug/kg/min via INTRAVENOUS

## 2015-11-29 MED ORDER — INSULIN REGULAR BOLUS VIA INFUSION
0.0000 [IU] | Freq: Three times a day (TID) | INTRAVENOUS | Status: DC
  Filled 2015-11-29: qty 10

## 2015-11-29 MED ORDER — SODIUM CHLORIDE 0.9 % IJ SOLN
OROMUCOSAL | Status: DC | PRN
  Administered 2015-11-29 (×3): 4 mL via TOPICAL

## 2015-11-29 MED FILL — Electrolyte-R (PH 7.4) Solution: INTRAVENOUS | Qty: 4000 | Status: AC

## 2015-11-29 MED FILL — Mannitol IV Soln 20%: INTRAVENOUS | Qty: 500 | Status: AC

## 2015-11-29 MED FILL — Magnesium Sulfate Inj 50%: INTRAMUSCULAR | Qty: 10 | Status: AC

## 2015-11-29 MED FILL — Heparin Sodium (Porcine) Inj 1000 Unit/ML: INTRAMUSCULAR | Qty: 30 | Status: AC

## 2015-11-29 MED FILL — Sodium Bicarbonate IV Soln 8.4%: INTRAVENOUS | Qty: 50 | Status: AC

## 2015-11-29 MED FILL — Heparin Sodium (Porcine) Inj 1000 Unit/ML: INTRAMUSCULAR | Qty: 10 | Status: AC

## 2015-11-29 MED FILL — Potassium Chloride Inj 2 mEq/ML: INTRAVENOUS | Qty: 40 | Status: AC

## 2015-11-29 MED FILL — Sodium Chloride IV Soln 0.9%: INTRAVENOUS | Qty: 2000 | Status: AC

## 2015-11-29 MED FILL — Lidocaine HCl IV Inj 20 MG/ML: INTRAVENOUS | Qty: 5 | Status: AC

## 2015-11-29 SURGICAL SUPPLY — 78 items
AGENT HMST MTR 8 SURGIFLO (HEMOSTASIS) ×1
BAG DECANTER FOR FLEXI CONT (MISCELLANEOUS) ×2 IMPLANT
BANDAGE ACE 4X5 VEL STRL LF (GAUZE/BANDAGES/DRESSINGS) ×2 IMPLANT
BANDAGE ACE 6X5 VEL STRL LF (GAUZE/BANDAGES/DRESSINGS) ×2 IMPLANT
BANDAGE ELASTIC 4 VELCRO ST LF (GAUZE/BANDAGES/DRESSINGS) ×2 IMPLANT
BANDAGE ELASTIC 6 VELCRO ST LF (GAUZE/BANDAGES/DRESSINGS) ×2 IMPLANT
BLADE STERNUM SYSTEM 6 (BLADE) ×2 IMPLANT
BLOOD HAEMOCONCENTR 700 MIDI (MISCELLANEOUS) ×2 IMPLANT
BNDG GAUZE ELAST 4 BULKY (GAUZE/BANDAGES/DRESSINGS) ×2 IMPLANT
CANISTER SUCTION 2500CC (MISCELLANEOUS) ×2 IMPLANT
CATH CPB KIT GERHARDT (MISCELLANEOUS) ×2 IMPLANT
CATH THORACIC 28FR (CATHETERS) ×2 IMPLANT
CLIP RETRACTION 3.0MM CORONARY (MISCELLANEOUS) ×2 IMPLANT
CONT SPEC 4OZ CLIKSEAL STRL BL (MISCELLANEOUS) ×2 IMPLANT
CRADLE DONUT ADULT HEAD (MISCELLANEOUS) ×2 IMPLANT
DRAIN CHANNEL 28F RND 3/8 FF (WOUND CARE) ×2 IMPLANT
DRAPE CARDIOVASCULAR INCISE (DRAPES) ×2
DRAPE SLUSH/WARMER DISC (DRAPES) ×2 IMPLANT
DRAPE SRG 135X102X78XABS (DRAPES) ×1 IMPLANT
DRSG AQUACEL AG ADV 3.5X14 (GAUZE/BANDAGES/DRESSINGS) ×2 IMPLANT
ELECT BLADE 4.0 EZ CLEAN MEGAD (MISCELLANEOUS) ×4
ELECT REM PT RETURN 9FT ADLT (ELECTROSURGICAL) ×4
ELECTRODE BLDE 4.0 EZ CLN MEGD (MISCELLANEOUS) ×2 IMPLANT
ELECTRODE REM PT RTRN 9FT ADLT (ELECTROSURGICAL) ×2 IMPLANT
FELT TEFLON 1X6 (MISCELLANEOUS) ×2 IMPLANT
GAUZE SPONGE 4X4 12PLY STRL (GAUZE/BANDAGES/DRESSINGS) ×4 IMPLANT
GLOVE BIO SURGEON STRL SZ 6.5 (GLOVE) ×20 IMPLANT
GLOVE BIO SURGEON STRL SZ7.5 (GLOVE) ×4 IMPLANT
GLOVE BIO SURGEON STRL SZ8 (GLOVE) ×2 IMPLANT
GLOVE BIOGEL PI IND STRL 6.5 (GLOVE) ×4 IMPLANT
GLOVE BIOGEL PI IND STRL 7.0 (GLOVE) ×2 IMPLANT
GLOVE BIOGEL PI INDICATOR 6.5 (GLOVE) ×4
GLOVE BIOGEL PI INDICATOR 7.0 (GLOVE) ×2
GOWN STRL REUS W/ TWL LRG LVL3 (GOWN DISPOSABLE) ×8 IMPLANT
GOWN STRL REUS W/ TWL XL LVL3 (GOWN DISPOSABLE) ×2 IMPLANT
GOWN STRL REUS W/TWL LRG LVL3 (GOWN DISPOSABLE) ×16
GOWN STRL REUS W/TWL XL LVL3 (GOWN DISPOSABLE) ×4
HEMOSTAT POWDER SURGIFOAM 1G (HEMOSTASIS) ×6 IMPLANT
HEMOSTAT SURGICEL 2X14 (HEMOSTASIS) ×2 IMPLANT
KIT BASIN OR (CUSTOM PROCEDURE TRAY) ×2 IMPLANT
KIT CATH SUCT 8FR (CATHETERS) ×2 IMPLANT
KIT ROOM TURNOVER OR (KITS) ×2 IMPLANT
KIT SUCTION CATH 14FR (SUCTIONS) ×4 IMPLANT
KIT VASOVIEW HEMOPRO VH 3000 (KITS) ×2 IMPLANT
LEAD PACING MYOCARDI (MISCELLANEOUS) ×2 IMPLANT
MARKER GRAFT CORONARY BYPASS (MISCELLANEOUS) ×6 IMPLANT
NS IRRIG 1000ML POUR BTL (IV SOLUTION) ×12 IMPLANT
PACK OPEN HEART (CUSTOM PROCEDURE TRAY) ×2 IMPLANT
PAD ARMBOARD 7.5X6 YLW CONV (MISCELLANEOUS) ×4 IMPLANT
PAD ELECT DEFIB RADIOL ZOLL (MISCELLANEOUS) ×2 IMPLANT
PENCIL BUTTON HOLSTER BLD 10FT (ELECTRODE) ×4 IMPLANT
PUNCH AORTIC ROTATE  4.5MM 8IN (MISCELLANEOUS) ×2 IMPLANT
SET CARDIOPLEGIA MPS 5001102 (MISCELLANEOUS) ×2 IMPLANT
SPOGE SURGIFLO 8M (HEMOSTASIS) ×1
SPONGE SURGIFLO 8M (HEMOSTASIS) ×1 IMPLANT
SUT BONE WAX W31G (SUTURE) ×2 IMPLANT
SUT PROLENE 3 0 SH1 36 (SUTURE) ×4 IMPLANT
SUT PROLENE 4 0 TF (SUTURE) ×4 IMPLANT
SUT PROLENE 6 0 C 1 30 (SUTURE) ×2 IMPLANT
SUT PROLENE 6 0 CC (SUTURE) ×12 IMPLANT
SUT PROLENE 7 0 BV 1 (SUTURE) ×2 IMPLANT
SUT PROLENE 7 0 BV1 MDA (SUTURE) ×6 IMPLANT
SUT PROLENE 8 0 BV175 6 (SUTURE) ×8 IMPLANT
SUT STEEL 6MS V (SUTURE) ×2 IMPLANT
SUT STEEL SZ 6 DBL 3X14 BALL (SUTURE) ×2 IMPLANT
SUT VIC AB 1 CTX 18 (SUTURE) ×4 IMPLANT
SUT VIC AB 2-0 CT1 27 (SUTURE) ×2
SUT VIC AB 2-0 CT1 TAPERPNT 27 (SUTURE) ×1 IMPLANT
SUT VIC AB 3-0 X1 27 (SUTURE) ×2 IMPLANT
SUTURE E-PAK OPEN HEART (SUTURE) ×2 IMPLANT
SYSTEM SAHARA CHEST DRAIN ATS (WOUND CARE) ×2 IMPLANT
TOWEL OR 17X24 6PK STRL BLUE (TOWEL DISPOSABLE) ×4 IMPLANT
TOWEL OR 17X26 10 PK STRL BLUE (TOWEL DISPOSABLE) ×4 IMPLANT
TRAY FOLEY IC TEMP SENS 14FR (CATHETERS) ×2 IMPLANT
TRAY FOLEY IC TEMP SENS 16FR (CATHETERS) IMPLANT
TUBING INSUFFLATION (TUBING) ×2 IMPLANT
UNDERPAD 30X30 (UNDERPADS AND DIAPERS) ×2 IMPLANT
WATER STERILE IRR 1000ML POUR (IV SOLUTION) ×4 IMPLANT

## 2015-11-29 NOTE — Anesthesia Procedure Notes (Signed)
Procedure Name: Intubation Date/Time: 11/29/2015 8:46 AM Performed by: Garrison Columbus T Pre-anesthesia Checklist: Patient identified, Emergency Drugs available, Suction available and Patient being monitored Patient Re-evaluated:Patient Re-evaluated prior to inductionOxygen Delivery Method: Circle System Utilized Preoxygenation: Pre-oxygenation with 100% oxygen Intubation Type: IV induction Ventilation: Mask ventilation without difficulty and Oral airway inserted - appropriate to patient size Laryngoscope Size: Mac and 4 Grade View: Grade I Tube type: Oral Tube size: 8.0 mm Number of attempts: 1 Airway Equipment and Method: Stylet and Oral airway Placement Confirmation: ETT inserted through vocal cords under direct vision,  positive ETCO2 and breath sounds checked- equal and bilateral Secured at: 23 cm Tube secured with: Tape Dental Injury: Teeth and Oropharynx as per pre-operative assessment  Comments: Intubation by Jonetta Speak

## 2015-11-29 NOTE — Anesthesia Postprocedure Evaluation (Signed)
Anesthesia Post Note  Patient: Barry Ohr Kallenbach Sr.  Procedure(s) Performed: Procedure(s) (LRB): CORONARY ARTERY BYPASS GRAFTING (CABG), ON PUMP, TIMES FIVE, USING LEFT INTERNAL MAMMARY ARTERY, RIGHT GREATER SAPHENOUS VEIN HARVESTED ENDOSCOPICALLY (N/A) INTRAOPERATIVE TRANSESOPHAGEAL ECHOCARDIOGRAM (N/A)  Patient location during evaluation: SICU Anesthesia Type: General Level of consciousness: sedated Pain management: pain level controlled Vital Signs Assessment: post-procedure vital signs reviewed and stable Respiratory status: patient remains intubated per anesthesia plan Cardiovascular status: stable Anesthetic complications: no    Last Vitals:  Vitals:   11/29/15 0400 11/29/15 0600  BP: 127/64 124/64  Pulse: 61 (!) 39  Resp: (!) 23 15  Temp:      Last Pain:  Vitals:   11/29/15 0000  TempSrc: Oral  PainSc:                  Jamaia Brum,W. EDMOND

## 2015-11-29 NOTE — Progress Notes (Signed)
Responded to nurse's request for prayer with patient and wife before his open-heart surgery. Wife appreciated copy of New Testament/Psalms for use in waiting room. Chaplain to follow-up with patient and family later today.    11/29/15 0630  Clinical Encounter Type  Visited With Patient and family together  Visit Type Initial  Referral From Nurse  Spiritual Encounters  Spiritual Needs Emotional;Prayer  Stress Factors  Patient Stress Factors Health changes;Other (Comment) (impending surgery)  Family Stress Factors Exhausted;Family relationships;Other (Comment) (patient's impending surgery)

## 2015-11-29 NOTE — Progress Notes (Signed)
Patient ID: Barry Officer Sr., male   DOB: 1932-05-18, 82 y.o.   MRN: [id number] EVENING ROUNDS NOTE :     [address]       [place] [address]             [phone]                 Day of Surgery Procedure(s) (LRB): CORONARY ARTERY BYPASS GRAFTING (CABG), ON PUMP, TIMES FIVE, USING LEFT INTERNAL MAMMARY ARTERY, RIGHT GREATER SAPHENOUS VEIN HARVESTED ENDOSCOPICALLY (N/A) INTRAOPERATIVE TRANSESOPHAGEAL ECHOCARDIOGRAM (N/A)  Total Length of Stay:  LOS: 4 days  BP 124/80   Pulse 89   Temp (!) 96.6 F (35.9 C)   Resp (!) 22   Ht 5\' 11"  (1.803 m)   Wt 192 lb 7.4 oz (87.3 kg)   SpO2 98%   BMI 26.84 kg/m   .Intake/Output      09/19 0701 - 09/20 0700 09/20 0701 - 09/21 0700   P.O. 100    I.V. (mL/kg) 694.3 (8) 1900 (21.8)   Blood  1020   NG/GT  30   Total Intake(mL/kg) 794.3 (9.1) 2950 (33.8)   Urine (mL/kg/hr) 670 (0.3) 1150 (1.1)   Emesis/NG output  5 (0)   Blood  1000 (1)   Chest Tube  10 (0)   Total Output 670 2165   Net +124.3 +785        Urine Occurrence 1 x      . sodium chloride    . [START ON 11/30/2015] sodium chloride    . sodium chloride 10 mL/hr (11/29/15 1634)  . dexmedetomidine 0.7 mcg/kg/hr (11/29/15 1520)  . DOPamine 3 mcg/kg/min (11/29/15 1545)  . insulin (NOVOLIN-R) infusion 1.9 Units/hr (11/29/15 1520)  . lactated ringers 20 mL/hr (11/29/15 1520)  . lactated ringers 10 mL/hr (11/29/15 1636)  . nitroGLYCERIN 40 mcg/min (11/29/15 1520)  . phenylephrine (NEO-SYNEPHRINE) Adult infusion       Lab Results  Component Value Date   WBC 12.8 (H) 11/29/2015   HGB 11.9 (L) 11/29/2015   HCT 35.0 (L) 11/29/2015   PLT 81 (L) 11/29/2015   GLUCOSE 123 (H) 11/29/2015   CHOL 117 10/31/2015   TRIG 98.0 10/31/2015   HDL 44.60 10/31/2015   LDLCALC 53 10/31/2015   ALT 18 11/28/2015   AST 19 11/28/2015   NA 144 11/29/2015   K 4.1 11/29/2015   CL 108 11/29/2015   CREATININE 0.90 11/29/2015   BUN 12 11/29/2015   CO2 22 11/29/2015   TSH  0.873 11/26/2015   INR 1.59 11/29/2015   HGBA1C 7.0 (H) 11/28/2015   MICROALBUR 2.9 (H) 03/08/2015   Sedated on vent  Not bleeding  On dopamine 3- 5   [name] MD  Beeper [phone] Office [phone] 11/29/2015 6:51 PM

## 2015-11-29 NOTE — Anesthesia Procedure Notes (Signed)
Central Venous Catheter Insertion Performed by: anesthesiologist Patient location: Pre-op. Preanesthetic checklist: patient identified, IV checked, site marked, risks and benefits discussed, surgical consent, monitors and equipment checked, pre-op evaluation, timeout performed and anesthesia consent Landmarks identified PA cath was placed.Swan type and PA catheter depth:thermodilationProcedure performed using ultrasound guided technique. Attempts: 1 Patient tolerated the procedure well with no immediate complications.       

## 2015-11-29 NOTE — Brief Op Note (Addendum)
11/25/2015 - 11/29/2015  1:38 PM      [address]       [place] [address]             [phone]     11/25/2015 - 11/29/2015  1:38 PM  PATIENT:  Barry Odor Paletta Sr.  82 y.o. male  PRE-OPERATIVE DIAGNOSIS:  CAD  POST-OPERATIVE DIAGNOSIS:  CAD  PROCEDURE:  Procedure(s): CORONARY ARTERY BYPASS GRAFTING (CABG)X5 LIMA-LAD; SEQ SVG-OM1-OM2;SVG-DIAG; SVG-PL INTRAOPERATIVE TRANSESOPHAGEAL ECHOCARDIOGRAM [facility] RIGHT LEG  SURGEON:  Surgeon(s): [name], MD   PHYSICIAN ASSISTANT: WAYNE GOLD PA-C  ANESTHESIA:   general  PATIENT CONDITION:  ICU - intubated and hemodynamically stable.  PRE-OPERATIVE WEIGHT: [id number]  COMPLICATIONS: NO KNOWN

## 2015-11-29 NOTE — Progress Notes (Signed)
  Echocardiogram Echocardiogram Transesophageal has been performed.  Darlina Sicilian M 11/29/2015, 11:37 AM

## 2015-11-29 NOTE — Transfer of Care (Signed)
Immediate Anesthesia Transfer of Care Note  Patient: Barry Mcdaniel.  Procedure(s) Performed: Procedure(s) with comments: CORONARY ARTERY BYPASS GRAFTING (CABG), ON PUMP, TIMES FIVE, USING LEFT INTERNAL MAMMARY ARTERY, RIGHT GREATER SAPHENOUS VEIN HARVESTED ENDOSCOPICALLY (N/A) - LIMA-LAD; SEQ SVG-OM1-OM2;SVG-DIAG; SVG-PL INTRAOPERATIVE TRANSESOPHAGEAL ECHOCARDIOGRAM (N/A)  Patient Location: SICU  Anesthesia Type:General  Level of Consciousness: sedated, unresponsive and Patient remains intubated per anesthesia plan  Airway & Oxygen Therapy: Patient remains intubated per anesthesia plan and Patient placed on Ventilator (see vital sign flow sheet for setting)  Post-op Assessment: Report given to RN and Post -op Vital signs reviewed and stable  Post vital signs: Reviewed and stable  Last Vitals:  Vitals:   11/29/15 0400 11/29/15 0600  BP: 127/64 124/64  Pulse: 61 (!) 39  Resp: (!) 23 15  Temp:      Last Pain:  Vitals:   11/29/15 0000  TempSrc: Oral  PainSc:       Patients Stated Pain Goal: 3 ([id number])  Complications: No apparent anesthesia complications

## 2015-11-29 NOTE — Anesthesia Procedure Notes (Signed)
Central Venous Catheter Insertion Performed by: anesthesiologist Patient location: Pre-op. Preanesthetic checklist: patient identified, IV checked, site marked, risks and benefits discussed, surgical consent, monitors and equipment checked, pre-op evaluation, timeout performed and anesthesia consent Position: Trendelenburg Lidocaine 1% used for infiltration Landmarks identified Catheter size: 9 Fr MAC introducer Procedure performed using ultrasound guided technique. Attempts: 1 Following insertion, line sutured and dressing applied. Post procedure assessment: blood return through all ports, free fluid flow and no air. Patient tolerated the procedure well with no immediate complications.

## 2015-11-30 ENCOUNTER — Encounter (HOSPITAL_COMMUNITY): Payer: Self-pay

## 2015-11-30 ENCOUNTER — Inpatient Hospital Stay (HOSPITAL_COMMUNITY): Payer: Medicare Other

## 2015-11-30 LAB — BASIC METABOLIC PANEL
ANION GAP: 5 (ref 5–15)
BUN: 9 mg/dL (ref 6–20)
CALCIUM: 7.7 mg/dL — AB (ref 8.9–10.3)
CO2: 22 mmol/L (ref 22–32)
CREATININE: 0.9 mg/dL (ref 0.61–1.24)
Chloride: 112 mmol/L — ABNORMAL HIGH (ref 101–111)
GFR calc Af Amer: >60 mL/min (ref >60)
GLUCOSE: 112 mg/dL — AB (ref 65–99)
Potassium: 4.4 mmol/L (ref 3.5–5.1)
Sodium: 139 mmol/L (ref 135–145)

## 2015-11-30 LAB — POCT I-STAT 3, ART BLOOD GAS (G3+)
ACID-BASE DEFICIT: 2 mmol/L (ref 0.0–2.0)
Acid-base deficit: 4 mmol/L — ABNORMAL HIGH (ref 0.0–2.0)
Acid-base deficit: 5 mmol/L — ABNORMAL HIGH (ref 0.0–2.0)
Acid-base deficit: 5 mmol/L — ABNORMAL HIGH (ref 0.0–2.0)
Bicarbonate: 23.1 mmol/L (ref 20.0–28.0)
Bicarbonate: 19.7 mmol/L — ABNORMAL LOW (ref 20.0–28.0)
Bicarbonate: 19 mmol/L — ABNORMAL LOW (ref 20.0–28.0)
Bicarbonate: 19 mmol/L — ABNORMAL LOW (ref 20.0–28.0)
O2 SAT: 95 %
O2 SAT: 95 %
O2 Saturation: 97 %
O2 Saturation: 98 %
PCO2 ART: 33.6 mmHg (ref 32.0–48.0)
PCO2 ART: 32.8 mmHg (ref 32.0–48.0)
PH ART: 7.355 (ref 7.350–7.450)
PH ART: 7.381 (ref 7.350–7.450)
PH ART: 7.397 (ref 7.350–7.450)
PO2 ART: 75 mmHg — AB (ref 83.0–108.0)
PO2 ART: 108 mmHg (ref 83.0–108.0)
Patient temperature: 36.9
Patient temperature: 37.2
TCO2: 24 mmol/L (ref 0–100)
TCO2: 21 mmol/L (ref 0–100)
TCO2: 20 mmol/L (ref 0–100)
TCO2: 20 mmol/L (ref 0–100)
pCO2 arterial: 41.4 mmHg (ref 32.0–48.0)
pCO2 arterial: 30.9 mmHg — ABNORMAL LOW (ref 32.0–48.0)
pH, Arterial: 7.372 (ref 7.350–7.450)
pO2, Arterial: 92 mmHg (ref 83.0–108.0)
pO2, Arterial: 78 mmHg — ABNORMAL LOW (ref 83.0–108.0)

## 2015-11-30 LAB — MAGNESIUM
Magnesium: 2.3 mg/dL (ref 1.7–2.4)
Magnesium: 2.2 mg/dL (ref 1.7–2.4)

## 2015-11-30 LAB — CREATININE, SERUM
Creatinine, Ser: 1.17 mg/dL (ref 0.61–1.24)
GFR calc Af Amer: >60 mL/min (ref >60)
GFR calc non Af Amer: 57 mL/min — ABNORMAL LOW (ref >60)

## 2015-11-30 LAB — POCT I-STAT, CHEM 8
BUN: 13 mg/dL (ref 6–20)
CREATININE: 1.1 mg/dL (ref 0.61–1.24)
Calcium, Ion: 1.18 mmol/L (ref 1.15–1.40)
Chloride: 108 mmol/L (ref 101–111)
Glucose, Bld: 98 mg/dL (ref 65–99)
HEMATOCRIT: 34 % — AB (ref 39.0–52.0)
HEMOGLOBIN: 11.6 g/dL — AB (ref 13.0–17.0)
Potassium: 3.9 mmol/L (ref 3.5–5.1)
Sodium: 140 mmol/L (ref 135–145)
TCO2: 20 mmol/L (ref 0–100)

## 2015-11-30 LAB — GLUCOSE, CAPILLARY
GLUCOSE-CAPILLARY: 108 mg/dL — AB (ref 65–99)
GLUCOSE-CAPILLARY: 108 mg/dL — AB (ref 65–99)
GLUCOSE-CAPILLARY: 113 mg/dL — AB (ref 65–99)
GLUCOSE-CAPILLARY: 151 mg/dL — AB (ref 65–99)
GLUCOSE-CAPILLARY: 143 mg/dL — AB (ref 65–99)
GLUCOSE-CAPILLARY: 146 mg/dL — AB (ref 65–99)
GLUCOSE-CAPILLARY: 150 mg/dL — AB (ref 65–99)
GLUCOSE-CAPILLARY: 116 mg/dL — AB (ref 65–99)
GLUCOSE-CAPILLARY: 90 mg/dL (ref 65–99)
GLUCOSE-CAPILLARY: 85 mg/dL (ref 65–99)
GLUCOSE-CAPILLARY: 95 mg/dL (ref 65–99)
Glucose-Capillary: 109 mg/dL — ABNORMAL HIGH (ref 65–99)
Glucose-Capillary: 110 mg/dL — ABNORMAL HIGH (ref 65–99)
Glucose-Capillary: 120 mg/dL — ABNORMAL HIGH (ref 65–99)
Glucose-Capillary: 121 mg/dL — ABNORMAL HIGH (ref 65–99)
Glucose-Capillary: 129 mg/dL — ABNORMAL HIGH (ref 65–99)
Glucose-Capillary: 144 mg/dL — ABNORMAL HIGH (ref 65–99)
Glucose-Capillary: 95 mg/dL (ref 65–99)

## 2015-11-30 LAB — PREPARE PLATELET PHERESIS: Unit division: 0

## 2015-11-30 LAB — CBC
HCT: 34.8 % — ABNORMAL LOW (ref 39.0–52.0)
HEMATOCRIT: 34.6 % — AB (ref 39.0–52.0)
HEMOGLOBIN: 11 g/dL — AB (ref 13.0–17.0)
Hemoglobin: 11 g/dL — ABNORMAL LOW (ref 13.0–17.0)
MCH: 27.4 pg (ref 26.0–34.0)
MCH: 27.6 pg (ref 26.0–34.0)
MCHC: 31.6 g/dL (ref 30.0–36.0)
MCHC: 31.8 g/dL (ref 30.0–36.0)
MCV: 86.8 fL (ref 78.0–100.0)
MCV: 86.7 fL (ref 78.0–100.0)
PLATELETS: 87 10*3/uL — AB (ref 150–400)
Platelets: 91 10*3/uL — ABNORMAL LOW (ref 150–400)
RBC: 4.01 MIL/uL — ABNORMAL LOW (ref 4.22–5.81)
RBC: 3.99 MIL/uL — ABNORMAL LOW (ref 4.22–5.81)
RDW: 14.7 % (ref 11.5–15.5)
RDW: 14.9 % (ref 11.5–15.5)
WBC: 10.4 10*3/uL (ref 4.0–10.5)
WBC: 13.4 10*3/uL — ABNORMAL HIGH (ref 4.0–10.5)

## 2015-11-30 MED ORDER — INSULIN ASPART 100 UNIT/ML ~~LOC~~ SOLN
0.0000 [IU] | SUBCUTANEOUS | Status: DC
  Administered 2015-12-01 – 2015-12-02 (×4): 2 [IU] via SUBCUTANEOUS

## 2015-11-30 MED ORDER — GUAIFENESIN ER 600 MG PO TB12
600.0000 mg | ORAL_TABLET | Freq: Two times a day (BID) | ORAL | Status: AC
  Administered 2015-11-30 – 2015-12-03 (×6): 600 mg via ORAL
  Filled 2015-11-30 (×6): qty 1

## 2015-11-30 MED ORDER — INSULIN DETEMIR 100 UNIT/ML ~~LOC~~ SOLN
20.0000 [IU] | Freq: Once | SUBCUTANEOUS | Status: AC
  Administered 2015-11-30: 20 [IU] via SUBCUTANEOUS
  Filled 2015-11-30: qty 0.2

## 2015-11-30 MED ORDER — ENOXAPARIN SODIUM 30 MG/0.3ML ~~LOC~~ SOLN
30.0000 mg | Freq: Every day | SUBCUTANEOUS | Status: DC
  Administered 2015-11-30 – 2015-12-04 (×5): 30 mg via SUBCUTANEOUS
  Filled 2015-11-30 (×5): qty 0.3

## 2015-11-30 MED ORDER — FUROSEMIDE 10 MG/ML IJ SOLN
40.0000 mg | Freq: Once | INTRAMUSCULAR | Status: AC
Start: 2015-11-30 — End: 2015-11-30
  Administered 2015-11-30: 40 mg via INTRAVENOUS

## 2015-11-30 MED ORDER — INSULIN DETEMIR 100 UNIT/ML ~~LOC~~ SOLN
20.0000 [IU] | Freq: Every day | SUBCUTANEOUS | Status: DC
  Administered 2015-12-01 – 2015-12-03 (×2): 20 [IU] via SUBCUTANEOUS
  Filled 2015-11-30 (×3): qty 0.2

## 2015-11-30 NOTE — Progress Notes (Signed)
EKG CRITICAL VALUE     12 lead EKG performed.  Critical value noted. Hurshel Party, RN notified.   Ladon Heney L, CCT 11/30/2015 8:48 AM

## 2015-11-30 NOTE — Progress Notes (Signed)
Inpatient Diabetes Program Recommendations  AACE/ADA: New Consensus Statement on Inpatient Glycemic Control (2015)  Target Ranges:  Prepandial:   less than 140 mg/dL      Peak postprandial:   less than 180 mg/dL (1-2 hours)      Critically ill patients:  140 - 180 mg/dL   Lab Results  Component Value Date   GLUCAP 116 (H) 11/30/2015   HGBA1C 7.0 (H) 11/28/2015    Met with patient and his wife to clarify home diabetes medications; he takes Toujeo 32 units qam (decreased from 42 units because he was having low blood sugars in the morning), Humalog 75/25 insulin 20 units qam, 14 units pre-lunch and 16 units pre-supper.  He is a patient of Dr. Dwyane Dee and sees him on a regular basis.  Gentry Fitz, RN, BA, MHA, CDE Diabetes Coordinator Inpatient Diabetes Program  [phone] (Team Pager) [phone] ([place]) 11/30/2015 2:22 PM

## 2015-11-30 NOTE — Progress Notes (Addendum)
Placed back on original settings due to poor pt effort on NIF(-8) and VC(4L) RT and RN tried for over 25 minutes to instruct pt on NIF and VC.

## 2015-11-30 NOTE — Procedures (Signed)
Extubation Procedure Note  Patient Details:   Name: Barry KAJIWARA Sr. DOB: [birth year] MRN: [id number]   Airway Documentation:     Evaluation  O2 sats: stable throughout Complications: No apparent complications Patient did tolerate procedure well. Bilateral Breath Sounds: Pleural rub, Diminished   Yes  Extubated patient at 0945 after he had a -22 NIF and VC 1.2.  Extubated per SICU wean protocol  Dimple Nanas 11/30/2015, 8:53 AM

## 2015-11-30 NOTE — Progress Notes (Signed)
Patient ID: Barry Officer Sr., male   DOB: Mar 11, 1933, 82 y.o.   MRN: [id number] T[facility] DAILY ICU PROGRESS NOTE                   [address]            [place] [address]          [phone]   1 Day Post-Op Procedure(s) (LRB): CORONARY ARTERY BYPASS GRAFTING (CABG), ON PUMP, TIMES FIVE, USING LEFT INTERNAL MAMMARY ARTERY, RIGHT GREATER SAPHENOUS VEIN HARVESTED ENDOSCOPICALLY (N/A) INTRAOPERATIVE TRANSESOPHAGEAL ECHOCARDIOGRAM (N/A)  Total Length of Stay:  LOS: 5 days   Subjective: Slow to wean vent last night. Now extubated and talkative, alert and orientated   Objective: Vital signs in last 24 hours: Temp:  [96.6 F (35.9 C)-101.5 F (38.6 C)] 99.1 F (37.3 C) (09/21 1200) Pulse Rate:  [40-90] 88 (09/21 1200) Cardiac Rhythm: A-V Sequential paced (09/21 0800) Resp:  [12-26] 19 (09/21 1200) BP: (81-137)/(54-88) 110/78 (09/21 1200) SpO2:  [94 %-100 %] 100 % (09/21 1200) Arterial Line BP: (101-194)/(45-78) 152/69 (09/21 1200) FiO2 (%):  [40 %-100 %] 40 % (09/21 0807) Weight:  [203 lb 7.8 oz (92.3 kg)] 203 lb 7.8 oz (92.3 kg) (09/21 0500)  Filed Weights   11/28/15 0500 11/29/15 0430 11/30/15 0500  Weight: 192 lb (87.1 kg) 192 lb 7.4 oz (87.3 kg) 203 lb 7.8 oz (92.3 kg)    Weight change: 11 lb 0.4 oz (5 kg)   Hemodynamic parameters for last 24 hours: PAP: (23-49)/(0-27) 49/15 CVP:  [20 mmHg] 20 mmHg CO:  [3 L/min-5.7 L/min] 5.6 L/min CI:  [1.4 L/min/m2-2.8 L/min/m2] 2.7 L/min/m2  Intake/Output from previous day: 09/20 0701 - 09/21 0700 In: 5075.2 [I.V.:3155.2; Blood:1020; NG/GT:150; IV Piggyback:750] Out: 3154 [Urine:2975; Emesis/NG output:155; Blood:1000; Chest Tube:265]  Intake/Output this shift: Total I/O In: 346.5 [I.V.:346.5] Out: 235 [Urine:165; Chest Tube:70]  Current Meds: Scheduled Meds: . acetaminophen  1,000 mg Oral Q6H   Or  . acetaminophen (TYLENOL) oral liquid 160 mg/5 mL  1,000 mg Per Tube Q6H  . acetylcysteine  2 mL Nebulization  TID  . aspirin EC  325 mg Oral Daily   Or  . aspirin  324 mg Per Tube Daily  . bisacodyl  10 mg Oral Daily   Or  . bisacodyl  10 mg Rectal Daily  . chlorhexidine gluconate (MEDLINE KIT)  15 mL Mouth Rinse BID  . docusate sodium  200 mg Oral Daily  . enoxaparin (LOVENOX) injection  30 mg Subcutaneous QHS  . finasteride  5 mg Oral Daily  . gabapentin  300 mg Oral BID  . insulin aspart  0-24 Units Subcutaneous Q4H  . insulin detemir  20 Units Subcutaneous Once  . [START ON 12/01/2015] insulin detemir  20 Units Subcutaneous Daily  . insulin regular  0-10 Units Intravenous TID WC  . levalbuterol  0.63 mg Nebulization TID  . levothyroxine  112 mcg Oral QAC breakfast  . mouth rinse  15 mL Mouth Rinse QID  . metoprolol tartrate  12.5 mg Oral BID   Or  . metoprolol tartrate  12.5 mg Per Tube BID  . mupirocin ointment  1 application Nasal BID  . [START ON 12/01/2015] pantoprazole  40 mg Oral Daily  . rosuvastatin  10 mg Oral Daily  . sodium chloride flush  3 mL Intravenous Q12H   Continuous Infusions: . sodium chloride 20 mL/hr at 11/30/15 1200  . sodium chloride    . sodium  chloride 10 mL/hr at 11/30/15 1200  . dexmedetomidine Stopped (11/30/15 0130)  . insulin (NOVOLIN-R) infusion 4.5 Units/hr (11/30/15 1200)  . lactated ringers 20 mL/hr at 11/30/15 1200  . lactated ringers Stopped (11/30/15 0000)  . nitroGLYCERIN 30 mcg/min (11/30/15 1200)  . phenylephrine (NEO-SYNEPHRINE) Adult infusion     PRN Meds:.sodium chloride, lactated ringers, metoprolol, midazolam, morphine injection, ondansetron (ZOFRAN) IV, oxyCODONE, sodium chloride flush, traMADol  General appearance: alert, cooperative and no distress Neurologic: intact Heart: regular rate and rhythm, S1, S2 normal, no murmur, click, rub or gallop Lungs: diminished breath sounds bibasilar Abdomen: soft, non-tender; bowel sounds normal; no masses,  no organomegaly Extremities: extremities normal, atraumatic, no cyanosis or edema and  Homans sign is negative, no sign of DVT Wound: sternum stable   Lab Results: CBC: Recent Labs  11/29/15 2115 11/29/15 2119 11/30/15 0325  WBC 11.5*  --  10.4  HGB 11.0* 10.9* 11.0*  HCT 35.2* 32.0* 34.8*  PLT 86*  --  87*   BMET:  Recent Labs  11/29/15 0514  11/29/15 2119 11/30/15 0325  NA 140  < > 140 139  K 4.5  < > 4.7 4.4  CL 112*  < > 108 112*  CO2 22  --   --  22  GLUCOSE 187*  < > 161* 112*  BUN 12  < > 11 9  CREATININE 1.12  < > 0.80 0.90  CALCIUM 8.4*  --   --  7.7*  < > = values in this interval not displayed.  CMET: Lab Results  Component Value Date   WBC 10.4 11/30/2015   HGB 11.0 (L) 11/30/2015   HCT 34.8 (L) 11/30/2015   PLT 87 (L) 11/30/2015   GLUCOSE 112 (H) 11/30/2015   CHOL 117 10/31/2015   TRIG 98.0 10/31/2015   HDL 44.60 10/31/2015   LDLCALC 53 10/31/2015   ALT 18 11/28/2015   AST 19 11/28/2015   NA 139 11/30/2015   K 4.4 11/30/2015   CL 112 (H) 11/30/2015   CREATININE 0.90 11/30/2015   BUN 9 11/30/2015   CO2 22 11/30/2015   TSH 0.873 11/26/2015   INR 1.59 11/29/2015   HGBA1C 7.0 (H) 11/28/2015   MICROALBUR 2.9 (H) 03/08/2015    PT/INR:  Recent Labs  11/29/15 1520  LABPROT 19.1*  INR 1.59   Radiology: Dg Chest Port 1 View  Result Date: 11/30/2015 CLINICAL DATA:  Hypoxia EXAM: PORTABLE CHEST 1 VIEW COMPARISON:  November 29, 2015 FINDINGS: Endotracheal tube tip is 3.1 cm above the carina. Swan-Ganz catheter tip is in the main pulmonary outflow tract. Nasogastric tube tip and side port are below the diaphragm. There is a persistent mediastinal drain and left chest tube. No pneumothorax. There is now interstitial and patchy alveolar edema in the lower lung zones with pleural effusions bilaterally. There is cardiomegaly with pulmonary venous hypertension. There is atherosclerotic calcification in the aorta. IMPRESSION: Tube and catheter positions as described. No pneumothorax. Evidence of congestive heart failure. Aortic  atherosclerosis. Electronically Signed   By: Bretta Bang III M.D.   On: 11/30/2015 08:41   Dg Chest Port 1 View  Result Date: 11/29/2015 CLINICAL DATA:  Status post cardiac surgery EXAM: PORTABLE CHEST 1 VIEW COMPARISON:  Four days ago FINDINGS: Endotracheal tube tip is just below the clavicular heads. An orogastric tube continues to the diaphragm at least. Swan-Ganz catheter from right IJ approach with tip directed towards the right main pulmonary artery. Thoracic drains in place. Mild pulmonary edema. Dependent atelectasis.  No visible pneumothorax. Stable cardiopericardial size. New changes of CABG. IMPRESSION: 1. Unremarkable positioning of tubes and central line as described. 2. Mild atelectasis and pulmonary edema. Electronically Signed   By: Monte Fantasia M.D.   On: 11/29/2015 15:49     Assessment/Plan: S/P Procedure(s) (LRB): CORONARY ARTERY BYPASS GRAFTING (CABG), ON PUMP, TIMES FIVE, USING LEFT INTERNAL MAMMARY ARTERY, RIGHT GREATER SAPHENOUS VEIN HARVESTED ENDOSCOPICALLY (N/A) INTRAOPERATIVE TRANSESOPHAGEAL ECHOCARDIOGRAM (N/A) Mobilize Diuresis Diabetes control d/c tubes/lines Continue foley due to strict I&O, patient in ICU and urinary output monitoring See progression orders Expected Acute  Blood - loss Anemia    [name] 11/30/2015 2:22 PM

## 2015-11-30 NOTE — Progress Notes (Signed)
RT and RN tried for second time to get NIF and VC. Pt had poor pt effort again even with coaching from both RT and RN. RT and RN tried for over 20 minutes to instruct pt with NIF. Pt had a NIF of (-10) and trouble keeping eyes open/following commands.

## 2015-11-30 NOTE — Progress Notes (Signed)
TCTS BRIEF SICU PROGRESS NOTE  1 Day Post-Op  S/P Procedure(s) (LRB): CORONARY ARTERY BYPASS GRAFTING (CABG), ON PUMP, TIMES FIVE, USING LEFT INTERNAL MAMMARY ARTERY, RIGHT GREATER SAPHENOUS VEIN HARVESTED ENDOSCOPICALLY (N/A) INTRAOPERATIVE TRANSESOPHAGEAL ECHOCARDIOGRAM (N/A)   Stable day AV paced w/ stable BP Breathing comfortably w/ O2 sats 96% on RA UOP adequate  Plan: Continue current plan  Rexene Alberts, MD 11/30/2015 6:07 PM

## 2015-12-01 ENCOUNTER — Inpatient Hospital Stay (HOSPITAL_COMMUNITY): Payer: Medicare Other

## 2015-12-01 LAB — GLUCOSE, CAPILLARY
GLUCOSE-CAPILLARY: 122 mg/dL — AB (ref 65–99)
GLUCOSE-CAPILLARY: 124 mg/dL — AB (ref 65–99)
GLUCOSE-CAPILLARY: 129 mg/dL — AB (ref 65–99)
Glucose-Capillary: 103 mg/dL — ABNORMAL HIGH (ref 65–99)
Glucose-Capillary: 140 mg/dL — ABNORMAL HIGH (ref 65–99)
Glucose-Capillary: 128 mg/dL — ABNORMAL HIGH (ref 65–99)

## 2015-12-01 LAB — BASIC METABOLIC PANEL
Anion gap: 8 (ref 5–15)
BUN: 17 mg/dL (ref 6–20)
CO2: 23 mmol/L (ref 22–32)
Calcium: 7.9 mg/dL — ABNORMAL LOW (ref 8.9–10.3)
Chloride: 107 mmol/L (ref 101–111)
Creatinine, Ser: 1.3 mg/dL — ABNORMAL HIGH (ref 0.61–1.24)
GFR calc Af Amer: 58 mL/min — ABNORMAL LOW (ref >60)
GFR calc non Af Amer: 50 mL/min — ABNORMAL LOW (ref >60)
Glucose, Bld: 116 mg/dL — ABNORMAL HIGH (ref 65–99)
Potassium: 3.7 mmol/L (ref 3.5–5.1)
Sodium: 138 mmol/L (ref 135–145)

## 2015-12-01 LAB — CBC
HCT: 31.6 % — ABNORMAL LOW (ref 39.0–52.0)
Hemoglobin: 10 g/dL — ABNORMAL LOW (ref 13.0–17.0)
MCH: 27.5 pg (ref 26.0–34.0)
MCHC: 31.6 g/dL (ref 30.0–36.0)
MCV: 86.8 fL (ref 78.0–100.0)
Platelets: 92 K/uL — ABNORMAL LOW (ref 150–400)
RBC: 3.64 MIL/uL — ABNORMAL LOW (ref 4.22–5.81)
RDW: 15 % (ref 11.5–15.5)
WBC: 11.5 K/uL — ABNORMAL HIGH (ref 4.0–10.5)

## 2015-12-01 MED ORDER — FUROSEMIDE 10 MG/ML IJ SOLN
40.0000 mg | Freq: Once | INTRAMUSCULAR | Status: AC
  Administered 2015-12-01: 40 mg via INTRAVENOUS

## 2015-12-01 MED ORDER — TRAMADOL HCL 50 MG PO TABS
50.0000 mg | ORAL_TABLET | ORAL | Status: DC | PRN
  Administered 2015-12-01 – 2015-12-03 (×2): 50 mg via ORAL
  Filled 2015-12-01 (×2): qty 1

## 2015-12-01 MED ORDER — MOMETASONE FURO-FORMOTEROL FUM 200-5 MCG/ACT IN AERO
2.0000 | INHALATION_SPRAY | Freq: Two times a day (BID) | RESPIRATORY_TRACT | Status: DC
  Administered 2015-12-01 – 2015-12-07 (×12): 2 via RESPIRATORY_TRACT
  Filled 2015-12-01 (×2): qty 8.8

## 2015-12-01 MED ORDER — POTASSIUM CHLORIDE 10 MEQ/50ML IV SOLN
10.0000 meq | INTRAVENOUS | Status: AC
  Administered 2015-12-01 (×3): 10 meq via INTRAVENOUS
  Filled 2015-12-01 (×3): qty 50

## 2015-12-01 MED ORDER — ACETYLCYSTEINE 20 % IN SOLN
2.0000 mL | Freq: Four times a day (QID) | RESPIRATORY_TRACT | Status: AC
  Administered 2015-12-01 – 2015-12-02 (×4): 2 mL via RESPIRATORY_TRACT
  Filled 2015-12-01 (×4): qty 4

## 2015-12-01 MED ORDER — OXYCODONE HCL 5 MG PO TABS: 5.0000 mg | ORAL_TABLET | ORAL | Status: DC | PRN

## 2015-12-01 NOTE — Progress Notes (Signed)
Patient ID: Barry Officer Sr., male   DOB: [birth year], 82 y.o.   MRN: [id number]   SICU Evening Rounds:   Hemodynamically stable  Urine output good  CT's out  CBC    Component Value Date/Time   WBC 11.5 (H) 12/01/2015 0420   RBC 3.64 (L) 12/01/2015 0420   HGB 10.0 (L) 12/01/2015 0420   HCT 31.6 (L) 12/01/2015 0420   PLT 92 (L) 12/01/2015 0420   MCV 86.8 12/01/2015 0420   MCH 27.5 12/01/2015 0420   MCHC 31.6 12/01/2015 0420   RDW 15.0 12/01/2015 0420   LYMPHSABS 1.2 03/14/2011 1055   MONOABS 0.5 03/14/2011 1055   EOSABS 0.2 03/14/2011 1055   BASOSABS 0.0 03/14/2011 1055     BMET    Component Value Date/Time   NA 138 12/01/2015 0420   K 3.7 12/01/2015 0420   CL 107 12/01/2015 0420   CO2 23 12/01/2015 0420   GLUCOSE 116 (H) 12/01/2015 0420   BUN 17 12/01/2015 0420   CREATININE 1.30 (H) 12/01/2015 0420   CALCIUM 7.9 (L) 12/01/2015 0420   GFRNONAA 50 (L) 12/01/2015 0420   GFRAA 58 (L) 12/01/2015 0420     A/P:  Stable postop course. Continue current plans

## 2015-12-01 NOTE — Progress Notes (Signed)
OT Cancellation Note  Patient Details Name: Barry Mcdaniel Sr. MRN: [id number] DOB: 1932/08/25   Cancelled Treatment:    Reason Eval/Treat Not Completed: Fatigue/lethargy limiting ability to participate. Pt had just returned to bed.  Will reattempt.   Darlina Rumpf [place], OTR/L [id number]  12/01/2015, 7:23 PM

## 2015-12-01 NOTE — Progress Notes (Signed)
RT note: instructed patient with use of flutter valve. With good effort patient had productive cough.

## 2015-12-01 NOTE — Discharge Summary (Addendum)
Physician Discharge Summary       [address]       [place] [address]             [phone]    Patient ID: Barry Prichard Rosemeyer Sr. MRN: [id number] DOB/AGE: 08-23-1932 82 y.o.  Admit date: 11/25/2015 Discharge date: 12/07/2015  Admission Diagnoses: 1. NSTEMI (non-ST elevated myocardial infarction) ([place]) 2. CAD  Active Diagnoses:  1. Type II diabetes mellitus, uncontrolled (HCC)   Mitral regurgitation and aortic stenosis 2. Pure hypercholesterolemia 3. Hypothyroidism 4. Iron deficiency anemia 5. CKD (chronic kidney disease), stage III 6. Bladder neck contracture 7.Chronic back pain 8.GERD (gastroesophageal reflux disease) 9.First degree heart block 10.History of basal cell carcinoma  11.History of Bell's palsy 12.History of chronic bronchitis 13.History of gout 14.History of hypertension 15. History of pulmonary embolus (PE) 16. Peripheral vascular disease ([place]) 17. Prostate carcinoma ([place]) 18. OSA (obstructive sleep apnea) 19. History of MI (myocardial infarction)-10' medical management  Procedure (s):  Left Heart Cath and Coronary Angiography by Dr. [name] on 11/27/2015:  Conclusion     Ost LM to LM lesion, 80 %stenosed.  Prox LAD lesion, 80 %stenosed.  Mid LAD lesion, 90 %stenosed.  Dist LAD lesion, 95 %stenosed.  Ost 1st Diag to 1st Diag lesion, 100 %stenosed.  Ost Ramus lesion, 90 %stenosed.  Ost Cx to Prox Cx lesion, 90 %stenosed.  Ost 1st Mrg to 1st Mrg lesion, 80 %stenosed.  Prox Cx to Mid Cx lesion, 100 %stenosed.  Prox RCA lesion, 95 %stenosed.  Dist RCA lesion, 90 %stenosed.   1. Critical 3 vessel and left main CAD 2. Mildly elevated LV EDP 3. No significant AV gradient by pullback.    Coronary artery bypass grafting x5 with left internal mammary to the left anterior descending coronary artery reverse saphenous vein graft to the diagonal coronary artery, sequential reverse saphenous vein graft to the first and second  obtuse marginal reverse saphenous vein graft to the posterior lateral branch of the right coronary artery with right thigh and calf greater saphenous vein harvesting endoscopically by Dr. Servando Snare on 11/29/2015.  History of Presenting Illness: This is an 82 year old male we are asked to see in cardiothoracic surgical consultation for consideration of CABG. He underwent cardiac catheterization on 11/27/2015 and was found to have severe multivessel coronary artery disease. Please see the report above.  The patient presented to the emergency department 2 days ago with chest pain. He has a history of "mild heart attack " 10 years ago  He does have multiple risk factors including diabetes mellitus type 2, hyperlipidemia, hypertension and a strong family history. He also has mild to moderate aortic stenosis, untreated obstructive sleep apnea and chronic right bundle branch block with first-degree AV block/left anterior fascicular block. He presented by EMS to the emergency department. He developed an episode of 10 over 10 substernal chest pain with acute diaphoresis. He was given supplemental nitroglycerin and aspirin and symptoms improved. It is noted that he has been experiencing increasing anginal symptoms over the past approximate 2 months. These symptoms were primarily with exertion. He was admitted for further evaluation and treatment to include medical stabilization and diagnostic workup. Barry Mcdaniel has severe multivessel CAD with NSTEMI. Dr. Servando Snare discussed with the patient and his wife the option of CABG. He has multiple risk factors including a diffusely diseased LAD and  the distal vessel is not by passable. Echo results showed no AI, mild AS, mild MR, and no MS, and LVEF 45-50% Pre  operative carotid duplex US showed no significant internal carotid artery stenosis. He underwent a CABG x 5 on 11/29/2015.  Brief Hospital Course:  The patient was extubated the morning of post operative day one  without difficulty. He remained afebrile and hemodynamically stable. He was initially AV paced. Gordy Councilman, a line, chest tubes, and foley were removed early in the post operative course. Lopressor was started and titrated accordingly. He was volume over loaded and diuresed. He had ABL anemia. He did not require a post op transfusion. His last H and H was 10.7/34.1. He was weaned off the insulin drip.  The patient's HGA1C pre op was 7. Once he was tolerating a diet, Metformin was restarted. The patient's glucose remained well controlled. The patient was felt surgically stable for transfer from the ICU to [facility] for further convalescence on 12/01/2015. He continues to progress with cardiac rehab. He was ambulating on room air. He has been tolerating a diet and has had a bowel movement. Epicardial pacing wires and chest tube sutures will be removed prior to discharge. He was kept an extra day due to hypertension. His blood pressure is better controlled this morning with the addition of Lisinopril. The patient is felt surgically stable for discharge today.    Latest Vital Signs: Blood pressure (!) 143/68, pulse 68, temperature 98.1 F (36.7 C), temperature source Oral, resp. rate 18, height 5\' 11"  (1.803 m), weight 182 lb 1.6 oz (82.6 kg), SpO2 96 %.  Physical Exam: General appearance: alert, cooperative and no distress Neurologic: intact Heart: irregular irregular Lungs: rhonchi bibasilar Abdomen: soft, non-tender; bowel sounds normal; no masses,  no organomegaly Extremities: extremities normal, atraumatic, no cyanosis or edema and Homans sign is negative, no sign of DVT Wound: sternum stable  Discharge Condition:Stable and discharged to home.  Recent laboratory studies:  Lab Results  Component Value Date   WBC 8.2 12/05/2015   HGB 10.7 (L) 12/05/2015   HCT 34.1 (L) 12/05/2015   MCV 86.5 12/05/2015   PLT 202 12/05/2015   Lab Results  Component Value Date   NA 139 12/05/2015   K 3.4 (L)  12/05/2015   CL 108 12/05/2015   CO2 23 12/05/2015   CREATININE 0.93 12/05/2015   GLUCOSE 140 (H) 12/05/2015      Diagnostic Studies: Dg Chest 2 View  Result Date: 12/05/2015 CLINICAL DATA:  Status post CABG on November 29, 2015. The patient reports mild shortness of breath today. EXAM: CHEST  2 VIEW COMPARISON:  Portable chest x-ray of December 02, 2015 FINDINGS: The lungs are well-expanded. Small bilateral pleural effusions blunt the posterior and lateral costophrenic angles and thicken the major fissure. The heart is top-normal in size. Left lower lobe atelectasis persists. There is no pneumothorax. The mediastinum is normal in width. The retrosternal soft tissues appear normal. There is calcification in the wall of the aortic arch. The bony thorax exhibits no acute abnormality. IMPRESSION: Further improvement in left lower lobe atelectasis or pneumonia. There are persistent small bilateral pleural effusions layering posteriorly. No pulmonary edema or pneumothorax. Aortic atherosclerosis. Electronically Signed   By: David  [name] M.D.   On: 12/05/2015 08:24   Dg Chest Port 1 View  Result Date: 12/02/2015 CLINICAL DATA:  Subsequent encounter for shortness of breath. EXAM: PORTABLE CHEST 1 VIEW COMPARISON:  12/01/2015 FINDINGS: Cardiac silhouette is top-normal in size. No mediastinal widening. No convincing mediastinal or hilar masses. Lungs are hyperexpanded. There is interstitial thickening in the bases. Residual atelectasis is noted in the lung  bases, left greater than right. No pulmonary edema. No significant pleural effusion. Left chest tube has been removed.  There is no pneumothorax. IMPRESSION: 1. No acute findings.  No mediastinal widening or pulmonary edema. 2. No pneumothorax following removal of the left chest tube. 3. Mild lung base opacity, left greater than right, consistent with atelectasis. Stable changes of COPD. Electronically Signed   By: Lajean Manes M.D.   On: 12/02/2015 07:57    Dg Chest Port 1 View  Result Date: 12/01/2015 CLINICAL DATA:  Evaluate chest tube placement EXAM: PORTABLE CHEST 1 VIEW COMPARISON:  11/30/2015 FINDINGS: Cardiac shadow remains enlarged. There is improved aeration in the bases bilaterally. The left chest tube is again identified in satisfactory position. Reduction in left pleural effusion and left basilar atelectasis is seen. No pneumothorax is noted. The Swan-Ganz catheter endotracheal tube and nasogastric catheter have been removed in the interval. Mediastinal drain is also been removed in the interval. Right jugular sheath remains. IMPRESSION: Significant improvement in the bases bilaterally. No significant residual infiltrate is seen. No pneumothorax is noted. Electronically Signed   By: Inez Catalina M.D.   On: 12/01/2015 08:38   Dg Chest Port 1 View  Result Date: 11/30/2015 CLINICAL DATA:  Hypoxia EXAM: PORTABLE CHEST 1 VIEW COMPARISON:  November 29, 2015 FINDINGS: Endotracheal tube tip is 3.1 cm above the carina. Swan-Ganz catheter tip is in the main pulmonary outflow tract. Nasogastric tube tip and side port are below the diaphragm. There is a persistent mediastinal drain and left chest tube. No pneumothorax. There is now interstitial and patchy alveolar edema in the lower lung zones with pleural effusions bilaterally. There is cardiomegaly with pulmonary venous hypertension. There is atherosclerotic calcification in the aorta. IMPRESSION: Tube and catheter positions as described. No pneumothorax. Evidence of congestive heart failure. Aortic atherosclerosis. Electronically Signed   By: Lowella Grip III M.D.   On: 11/30/2015 08:41   Dg Chest Port 1 View  Result Date: 11/29/2015 CLINICAL DATA:  Status post cardiac surgery EXAM: PORTABLE CHEST 1 VIEW COMPARISON:  Four days ago FINDINGS: Endotracheal tube tip is just below the clavicular heads. An orogastric tube continues to the diaphragm at least. Swan-Ganz catheter from right IJ approach  with tip directed towards the right main pulmonary artery. Thoracic drains in place. Mild pulmonary edema. Dependent atelectasis. No visible pneumothorax. Stable cardiopericardial size. New changes of CABG. IMPRESSION: 1. Unremarkable positioning of tubes and central line as described. 2. Mild atelectasis and pulmonary edema. Electronically Signed   By: Monte Fantasia M.D.   On: 11/29/2015 15:49   Dg Chest Port 1 View  Result Date: 11/25/2015 CLINICAL DATA:  Chest pain, shortness of breath, pain down both arms all day, hypertension, diabetes mellitus, asthma, prior MI, prostate cancer EXAM: PORTABLE CHEST 1 VIEW COMPARISON:  Portable exam 1832 hours compared 10/12/2013 FINDINGS: Enlargement of cardiac silhouette with minimal vascular congestion. Atherosclerotic calcification aorta. Emphysematous changes compatible with COPD. Interstitial changes at the lung bases bilaterally question interstitial infiltrates and edema with probable component of atelectasis as well. Upper lungs clear. No pleural effusion or pneumothorax. Bones demineralized. IMPRESSION: Enlargement of cardiac silhouette with minimal vascular congestion. Aortic atherosclerosis. COPD changes with mild interstitial edema at the lung bases and probable component of atelectasis. Electronically Signed   By: Lavonia Dana M.D.   On: 11/25/2015 18:41   Discharge Instructions    Amb Referral to Cardiac Rehabilitation    Complete by:  As directed    Diagnosis:  CABG  CABG X ___:  5      Discharge Medications:   Medication List    STOP taking these medications   oxyCODONE-acetaminophen 5-325 MG tablet Commonly known as:  ROXICET   TUSSIN DM 10-100 MG/5ML liquid Generic drug:  dextromethorphan-guaiFENesin     TAKE these medications   acetaminophen 500 MG tablet Commonly known as:  TYLENOL Take 500 mg by mouth every 8 (eight) hours as needed for mild pain, moderate pain or headache.   albuterol 108 (90 Base) MCG/ACT  inhaler Commonly known as:  PROVENTIL HFA;VENTOLIN HFA Inhale 2 puffs into the lungs every 6 (six) hours.   apixaban 5 MG Tabs tablet Commonly known as:  ELIQUIS Take 1 tablet (5 mg total) by mouth 2 (two) times daily.   aspirin EC 81 MG tablet Take 81 mg by mouth daily.   budesonide-formoterol 160-4.5 MCG/ACT inhaler Commonly known as:  SYMBICORT Inhale 2 puffs into the lungs 2 (two) times daily.   CRESTOR 10 MG tablet Generic drug:  rosuvastatin TAKE ONE TABLET BY MOUTH ONCE DAILY   finasteride 5 MG tablet Commonly known as:  PROSCAR Take 5 mg by mouth every morning.   gabapentin 300 MG capsule Commonly known as:  NEURONTIN TAKE ONE CAPSULE BY MOUTH 4 TIMES DAILY What changed:  See the new instructions.   glucose blood test strip Commonly known as:  ONE TOUCH ULTRA TEST Use as instructed to check blood sugar 3 times per day dx code E11.65   guaiFENesin 600 MG 12 hr tablet Commonly known as:  MUCINEX Take 1 tablet (600 mg total) by mouth every 12 (twelve) hours as needed for cough.   HUMALOG MIX 75/25 KWIKPEN (75-25) 100 UNIT/ML Kwikpen Generic drug:  Insulin Lispro Prot & Lispro INJECT 20 UNITS SUBCUTANEOUSLY IN THE MORNING AND 30 UNITS SUBCUTANEOUSLY IN THE EVENING DAILY What changed:  See the new instructions.   levothyroxine 112 MCG tablet Commonly known as:  SYNTHROID, LEVOTHROID TAKE ONE TABLET BY MOUTH ONCE DAILY What changed:  See the new instructions.   lisinopril 40 MG tablet Commonly known as:  PRINIVIL,ZESTRIL Take 1 tablet (40 mg total) by mouth daily.   menthol-thymol Liqd Apply 1 application topically as needed. For pain.   metFORMIN 500 MG 24 hr tablet Commonly known as:  GLUCOPHAGE-XR TAKE THREE TABLETS BY MOUTH ONCE DAILY WITH SUPPER What changed:  See the new instructions.   NASACORT AQ 55 MCG/ACT Aero nasal inhaler Generic drug:  triamcinolone Place 1-2 sprays into the nose daily.   polyethylene glycol packet Commonly known as:   MIRALAX / GLYCOLAX Take 17 g by mouth daily as needed for mild constipation, moderate constipation or severe constipation. 1 capful daily as needed   QUEtiapine 25 MG tablet Commonly known as:  SEROQUEL Take 1 tablet (25 mg total) by mouth at bedtime.   SYSTANE 0.4-0.3 % Soln Generic drug:  Polyethyl Glycol-Propyl Glycol Apply 1-2 drops to eye 3 (three) times daily as needed (for dry eyes).   TOUJEO SOLOSTAR 300 UNIT/ML Sopn Generic drug:  Insulin Glargine INJECT 42 UNITS SUBCUTANEOUSLY EVERY MORNING What changed:  See the new instructions.   traMADol 50 MG tablet Commonly known as:  ULTRAM Take 1 tablet (50 mg total) by mouth every 6 (six) hours.   vitamin B-12 1000 MCG tablet Commonly known as:  CYANOCOBALAMIN Take 1,000 mcg by mouth daily.     The patient has been discharged on:   1.Beta Blocker:  Yes [   ]  No   [ x  ]                              If No, reason: not tolerated  2.Ace Inhibitor/ARB: Yes [ x  ]                                     No  [  ]                                     If No, reason:   3.Statin:   Yes [ x  ]                  No  [   ]                  If No, reason:  4.Ecasa:  Yes  [ x  ]                  No   [   ]                  If No, reason:  Follow Up Appointments: Follow-up Information    Jenkins Rouge, MD .   Specialty:  Cardiology Why:  The office will call you to make an appointment with one of Dr. Kyla Balzarine APP Contact information: [id number] [address] [place] [address] [phone]        [name], MD Follow up on 01/04/2016.   Specialty:  Cardiothoracic Surgery Why:  PA/LAT CXR to be taken (at [facility] which is in the same building as Dr. Everrett Coombe office) on 01/04/2016 at 11:30 am;Appointment time is at 12:00 pm  Contact information: [address] [place] [address] [place], MD. Call in 4 week(s).   Specialty:   Endocrinology Why:  please call for an appointment Contact information: [place] [address] [phone]        [facility] .   Why:  HHPT/OT arranged- they will call you to set up home visits Contact information: [address] [place] [address] [phone]           Signed: Macy Mis 12/07/2015, 9:06 AM

## 2015-12-01 NOTE — Op Note (Signed)
NAMEMarland Mcdaniel  MAGED, MOCZYGEMBA NO.:  [id number]  MEDICAL RECORD NO.:  [id number]  LOCATION:  2S06C                        FACILITY:  [facility]  PHYSICIAN:  Lanelle Bal, MD    DATE OF BIRTH:  [birth year]  DATE OF PROCEDURE:  11/29/2015 DATE OF DISCHARGE:                              OPERATIVE REPORT   PREOPERATIVE DIAGNOSIS:  Three-vessel coronary artery disease with non- ST-elevation myocardial infarction, including left main obstruction.  POSTOPERATIVE DIAGNOSIS:  Three-vessel coronary artery disease with non- ST-elevation myocardial infarction, including left main obstruction.  SURGICAL PROCEDURE:  Coronary artery bypass grafting x5 with left internal mammary to the left anterior descending coronary artery reverse saphenous vein graft to the diagonal coronary artery, sequential reverse saphenous vein graft to the first and second obtuse marginal reverse saphenous vein graft to the posterior lateral branch of the right coronary artery with right thigh and calf greater saphenous vein harvesting endoscopically.  SURGEON:  Lanelle Bal, MD  FIRST ASSISTANT:  [name], PA-C  BRIEF HISTORY:  The patient is an 82 year old male, with overall declining physical status over the past several years.  He presents with significant chest pain, positive troponins, evidence of non-ST-elevation myocardial infarction.  He was stabilized medically, underwent cardiac catheterization.  He has been followed by Dr. [name] for mild aortic stenosis.  Cardiac catheterization was performed by Dr. [name] demonstrating severe three-vessel coronary artery disease including 99% stenosis of the proximal right coronary artery that was highly calcified vessel throughout its course.  Proximal left main obstruction and complex distal left main obstruction at the takeoff the circumflex branches.  The first and second obtuse marginal were calcified vessels but of good size.  The LAD was a poor  quality vessel with diffuse disease throughout.  Overall, ejection fraction was preserved.  At the time of heart cardiac catheterization, there was no gradient recorded across the aortic valve.  Previous echocardiograms and current echocardiograms suggested calcific aortic stenosis.  I discussed with the patient and his wife proceeding with coronary artery bypass grafting high risk in nature.  Because of his age, poor pulmonary function with FEV1 of 0.9, but with the critical disease, he was willing to proceed and signed informed consent.  DESCRIPTION OF THE PROCEDURE:  The patient underwent general endotracheal anesthesia without incident.  Swan-Ganz arterial line monitors had been placed.  Dr. [name] placed a TEE probe.  This gave good visualization of his aortic valve and at the time of TEE, the gradients were thought to be slightly higher than mild.  Dr. [name] came to review the TEE and after careful consideration, we decided that with the TEE that the patient did have mild-to-moderate aortic stenosis, not severe or critical and with his age and other comorbidities, decided to proceed with coronary artery bypass grafting only.  Continue to follow his aortic valve and if in the future it became worse, consider TAVR.  The skin of the chest and legs was prepped with Betadine and draped in usual sterile manner. Appropriate time-out was performed, and we proceeded with endo vein harvesting of the right greater saphenous vein from the calf and thigh. Median sternotomy was performed.  Left internal mammary artery was dissected down as a pedicle graft.  The distal artery was divided, had good free flow.  The pericardium was opened.  The patient did have evidence of left ventricular hypertrophy.  He was systemically heparinized.  Ascending aorta was cannulated.  The right atrium was cannulated.  An aortic root vent cardioplegia needle was introduced into the ascending aorta.  The  patient was placed on cardiopulmonary bypass 2.4 L/min/m2.  On examination of the coronary vessels, the entire right coronary artery was totally calcified as a rigid tube including to the very distal portion of the posterior descending.  The posterolateral branch of the right coronary artery was a soft vessel that we could open.  The LAD was very diffusely diseased between the mid and distal area.  There was an area that could not be opened.  Both the first and second obtuse marginal vessels were deeply intramyocardial and also heavily calcified as the aortic crossclamp was applied and 500 mL cold blood potassium cardioplegia was administered with diastolic arrest of the heart.  Myocardial septal temperature was monitored throughout the crossclamp.  Our attention was turned first to the posterolateral branch of the right coronary artery.  As noted, the right coronary artery was highly calcified and posterolateral branch was opened, admitted a 1 mm probe distally and proximally.  Using a running 7-0 Prolene, distal anastomosis was performed.  We then turned our attention to the lateral wall of the heart.  The first obtuse marginal was dissected out of its intramyocardial position and was relatively good quality vessel at the site of anastomosis.  A side-to-side anastomosis was performed with a segment of reverse saphenous vein graft with a running 7-0 Prolene.  The distal extent of the same vein was carried to the slightly larger second obtuse marginal vessel which was a larger vessel was also intramyocardial and took some difficulty in locating it.  Distal anastomosis was performed with a running 7-0 Prolene.  Additional cold blood cardioplegia was administered down the vein grafts.  Attention was then turned to the diagonal coronary artery which was a small vessel but with a diffuse nature of the disease of the LAD, felt that it should be bypassed also.  Also, the vessel was opened was 1  mm probe distally using a running 8-0 Prolene distal anastomosis was performed with a second reverse saphenous vein graft.  Attention was then turned to the LAD.  This vessel was diffusely diseased.  We did open it between the mid and distal third and were able to perform anastomosis that a 1 mm probe passed distally.  Using a running 8-0 Prolene, distal anastomosis was performed.  With the crossclamp still in place, three punch aortotomies were performed.  Each of 3 vein grafts were anastomosed to the ascending aorta.  The heart was allowed to passively fill and de- air.  Aortic cross-clamp was removed with a total crossclamp time of 123 minutes.  Bulldog on the mammary artery was removed.  There was rise in myocardial septal temperature.  The patient was in a nodal rhythm. Atrial and ventricular pacing wires were applied.  Body temperature was rewarmed to 37 degrees.  He was then ventilated and weaned from cardiopulmonary bypass and remained hemodynamically stable.  He was decannulated in usual fashion.  Protamine sulfate was administered. Graft markers were applied.  Because of low hematocrit while on bypass down to 20, the patient was given packed red blood cells.  He started with a low preoperative  hematocrit.  In addition, his platelet count was 74,000 and platelets were ordered.  Before closing the chest, after a portion of the platelets had gone, the patient had some drop in saturations and transient blood pressure decreased.  TEE showed good function of the myocardium.  This was felt to likely be related to the possible platelet reaction and the platelets were stopped immediately, and the patient remained pressure stabilized and O2 saturations returned to 100%.  The left pleural tube and Blake mediastinal drain were left in place.  Pericardium was loosely reapproximated.  Sternum was closed with #6 stainless steel wire.  Fascia was closed with interrupted 0 Vicryl, running 3-0  Vicryl, and subcutaneous tissue, 4-0 subcuticular stitch in skin edges.  Dry dressings were applied.  Sponge and needle counts were reported as correct.  At the completion of the procedure, the patient tolerated the procedure without obvious complication with the exception of the potential platelet reaction which quickly resolved as the platelet infusion was stopped.  Total pump time was 154 minutes.     Lanelle Bal, MD     EG/MEDQ  D:  11/30/2015  T:  11/30/2015  Job:  [id number]

## 2015-12-01 NOTE — Progress Notes (Signed)
Patient ID: Barry Officer Sr., male   DOB: 1932-03-30, 82 y.o.   MRN: [id number] TCTS DAILY ICU PROGRESS NOTE                   [address]            [place] [address]          [phone]   2 Days Post-Op Procedure(s) (LRB): CORONARY ARTERY BYPASS GRAFTING (CABG), ON PUMP, TIMES FIVE, USING LEFT INTERNAL MAMMARY ARTERY, RIGHT GREATER SAPHENOUS VEIN HARVESTED ENDOSCOPICALLY (N/A) INTRAOPERATIVE TRANSESOPHAGEAL ECHOCARDIOGRAM (N/A)  Total Length of Stay:  LOS: 6 days   Subjective: Up in chair, alert and talkative, pain from chest tube   Objective: Vital signs in last 24 hours: Temp:  [97.5 F (36.4 C)-99.1 F (37.3 C)] 98.2 F (36.8 C) (09/22 0400) Pulse Rate:  [53-96] 85 (09/22 0715) Cardiac Rhythm: A-V Sequential paced;Other (Comment) (09/22 0400) Resp:  [12-30] 19 (09/22 0715) BP: (106-153)/(61-103) 140/87 (09/22 0715) SpO2:  [91 %-100 %] 96 % (09/22 0715) Arterial Line BP: (133-194)/(54-78) 166/55 (09/21 1700) FiO2 (%):  [40 %] 40 % (09/21 0807) Weight:  [196 lb 14.4 oz (89.3 kg)] 196 lb 14.4 oz (89.3 kg) (09/22 0515)  Filed Weights   11/29/15 0430 11/30/15 0500 12/01/15 0515  Weight: 192 lb 7.4 oz (87.3 kg) 203 lb 7.8 oz (92.3 kg) 196 lb 14.4 oz (89.3 kg)    Weight change: -6 lb 9.4 oz (-2.987 kg)   Hemodynamic parameters for last 24 hours: PAP: (27-49)/(12-18) 30/14 CO:  [5.6 L/min] 5.6 L/min CI:  [2.7 L/min/m2] 2.7 L/min/m2  Intake/Output from previous day: 09/21 0701 - 09/22 0700 In: 1129.3 [P.O.:120; I.V.:909.3; IV Piggyback:100] Out: [id number] [Urine:1370; Chest Tube:200]  Intake/Output this shift: No intake/output data recorded.  Current Meds: Scheduled Meds: . acetaminophen  1,000 mg Oral Q6H   Or  . acetaminophen (TYLENOL) oral liquid 160 mg/5 mL  1,000 mg Per Tube Q6H  . acetylcysteine  2 mL Nebulization TID  . aspirin EC  325 mg Oral Daily   Or  . aspirin  324 mg Per Tube Daily  . bisacodyl  10 mg Oral Daily   Or  . bisacodyl   10 mg Rectal Daily  . docusate sodium  200 mg Oral Daily  . enoxaparin (LOVENOX) injection  30 mg Subcutaneous QHS  . finasteride  5 mg Oral Daily  . gabapentin  300 mg Oral BID  . guaiFENesin  600 mg Oral BID  . insulin aspart  0-24 Units Subcutaneous Q4H  . insulin detemir  20 Units Subcutaneous Daily  . insulin regular  0-10 Units Intravenous TID WC  . levalbuterol  0.63 mg Nebulization TID  . levothyroxine  112 mcg Oral QAC breakfast  . metoprolol tartrate  12.5 mg Oral BID   Or  . metoprolol tartrate  12.5 mg Per Tube BID  . mupirocin ointment  1 application Nasal BID  . pantoprazole  40 mg Oral Daily  . potassium chloride  10 mEq Intravenous Q1 Hr x 3  . rosuvastatin  10 mg Oral Daily  . sodium chloride flush  3 mL Intravenous Q12H   Continuous Infusions: . sodium chloride Stopped (11/30/15 1500)  . sodium chloride    . sodium chloride Stopped (11/30/15 1500)  . dexmedetomidine Stopped (11/30/15 0130)  . insulin (NOVOLIN-R) infusion Stopped (11/30/15 1600)  . lactated ringers 20 mL/hr at 12/01/15 0700  . lactated ringers Stopped (11/30/15 0000)  . nitroGLYCERIN 30  mcg/min (12/01/15 0700)  . phenylephrine (NEO-SYNEPHRINE) Adult infusion     PRN Meds:.sodium chloride, lactated ringers, metoprolol, midazolam, morphine injection, ondansetron (ZOFRAN) IV, oxyCODONE, sodium chloride flush, traMADol  General appearance: alert, cooperative and no distress Neurologic: intact Heart: regular rate and rhythm, S1, S2 normal, no murmur, click, rub or gallop Lungs: rhonchi bibasilar Abdomen: soft, non-tender; bowel sounds normal; no masses,  no organomegaly Extremities: extremities normal, atraumatic, no cyanosis or edema and Homans sign is negative, no sign of DVT Wound: sternum stable  Lab Results: CBC: Recent Labs  11/30/15 1640 11/30/15 1645 12/01/15 0420  WBC 13.4*  --  11.5*  HGB 11.0* 11.6* 10.0*  HCT 34.6* 34.0* 31.6*  PLT 91*  --  92*   BMET:  Recent Labs   11/30/15 0325  11/30/15 1645 12/01/15 0420  NA 139  --  140 138  K 4.4  --  3.9 3.7  CL 112*  --  108 107  CO2 22  --   --  23  GLUCOSE 112*  --  98 116*  BUN 9  --  13 17  CREATININE 0.90  < > 1.10 1.30*  CALCIUM 7.7*  --   --  7.9*  < > = values in this interval not displayed.  CMET: Lab Results  Component Value Date   WBC 11.5 (H) 12/01/2015   HGB 10.0 (L) 12/01/2015   HCT 31.6 (L) 12/01/2015   PLT 92 (L) 12/01/2015   GLUCOSE 116 (H) 12/01/2015   CHOL 117 10/31/2015   TRIG 98.0 10/31/2015   HDL 44.60 10/31/2015   LDLCALC 53 10/31/2015   ALT 18 11/28/2015   AST 19 11/28/2015   NA 138 12/01/2015   K 3.7 12/01/2015   CL 107 12/01/2015   CREATININE 1.30 (H) 12/01/2015   BUN 17 12/01/2015   CO2 23 12/01/2015   TSH 0.873 11/26/2015   INR 1.59 11/29/2015   HGBA1C 7.0 (H) 11/28/2015   MICROALBUR 2.9 (H) 03/08/2015    PT/INR:  Recent Labs  11/29/15 1520  LABPROT 19.1*  INR 1.59   Radiology: No results found.   Assessment/Plan: S/P Procedure(s) (LRB): CORONARY ARTERY BYPASS GRAFTING (CABG), ON PUMP, TIMES FIVE, USING LEFT INTERNAL MAMMARY ARTERY, RIGHT GREATER SAPHENOUS VEIN HARVESTED ENDOSCOPICALLY (N/A) INTRAOPERATIVE TRANSESOPHAGEAL ECHOCARDIOGRAM (N/A) Mobilize Diuresis Diabetes control d/c tubes/lines     [name] 12/01/2015 7:33 AM

## 2015-12-01 NOTE — Care Management Important Message (Signed)
Important Message  Patient Details  Name: Barry BUCKBEE Sr. MRN: [id number] Date of Birth: Aug 06, 1932   Medicare Important Message Given:  Yes    Zyon Grout 12/01/2015, 12:44 PM

## 2015-12-01 NOTE — Evaluation (Signed)
Physical Therapy Evaluation Patient Details Name: Barry RAUNER Mcdaniel. MRN: [id number] DOB: Jan 08, 1933 Today's Date: 12/01/2015   History of Present Illness  82 yo admitted with NSTEMI s/p CABGx3 on 9/21. PMHx:CAD, DM, HLD, HTN, AS, RBBB, prostate CA, hypothyroidism  Clinical Impression  Pt pleasant and moving well with slight disorientation and decreased memory. Pt educated for sternal precautions with handout provided and education throughout session. Pt with decreased mobility, gait, transfers and function who will benefit from acute therapy to maximize mobility, endurance and independence to decrease burden of care.   HR 81-90 sats 90-95% on RA BP before 129/87 After 134/75    Follow Up Recommendations Home health PT;Supervision/Assistance - 24 hour    Equipment Recommendations  3in1 (PT)    Recommendations for Other Services OT consult     Precautions / Restrictions Precautions Precautions: Sternal;Fall Precaution Comments: handout provided- sternal, chest tube, pacing wires      Mobility  Bed Mobility               General bed mobility comments: pt in recliner on arrival  Transfers Overall transfer level: Needs assistance   Transfers: Sit to/from Stand Sit to Stand: Min assist         General transfer comment: cues for hand placement, safety, sequence and anterior translation  Ambulation/Gait Ambulation/Gait assistance: Min assist Ambulation Distance (Feet): 115 Feet Assistive device: Rolling walker (2 wheeled) Gait Pattern/deviations: Step-through pattern;Decreased stride length   Gait velocity interpretation: Below normal speed for age/gender General Gait Details: cues for posture, position in RW, directional cues  Stairs            Wheelchair Mobility    Modified Rankin (Stroke Patients Only)       Balance                                             Pertinent Vitals/Pain Pain Assessment: No/denies pain    Home  Living Family/patient expects to be discharged to:: Private residence Living Arrangements: Spouse/significant other Available Help at Discharge: Family Type of Home: House Home Access: Stairs to enter   Technical brewer of Steps: 6 Home Layout: One level Home Equipment: Environmental consultant - 2 wheels;Cane - single point;Shower seat - built in      Prior Function Level of Independence: Independent               Journalist, newspaper        Extremity/Trunk Assessment   Upper Extremity Assessment: Defer to OT evaluation           Lower Extremity Assessment: Overall WFL for tasks assessed      Cervical / Trunk Assessment: Normal  Communication   Communication: No difficulties  Cognition Arousal/Alertness: Awake/alert Behavior During Therapy: WFL for tasks assessed/performed Overall Cognitive Status: Impaired/Different from baseline Area of Impairment: Memory;Orientation Orientation Level: Time   Memory: Decreased short-term memory;Decreased recall of precautions              General Comments      Exercises General Exercises - Lower Extremity Long Arc Quad: AROM;Both;10 reps;Seated   Assessment/Plan    PT Assessment Patient needs continued PT services  PT Problem List Decreased strength;Decreased activity tolerance;Decreased balance;Decreased knowledge of use of DME;Decreased mobility          PT Treatment Interventions Gait training;Stair training;Functional mobility training;Therapeutic exercise;Patient/family education;DME instruction;Therapeutic activities;Cognitive remediation  PT Goals (Current goals can be found in the Care Plan section)  Acute Rehab PT Goals Patient Stated Goal: return to yard work PT Goal Formulation: With patient Time For Goal Achievement: 12/15/15 Potential to Achieve Goals: Good    Frequency Min 3X/week   Barriers to discharge        Co-evaluation               End of Session Equipment Utilized During Treatment:  Gait belt Activity Tolerance: Patient tolerated treatment well Patient left: in chair;with call bell/phone within reach;with chair alarm set Nurse Communication: Mobility status         Time: [id number] PT Time Calculation (min) (ACUTE ONLY): 22 min   Charges:   PT Evaluation $PT Eval Moderate Complexity: 1 Procedure     PT G CodesMelford Aase 12/01/2015, 10:17 AM Elwyn Reach, [place]

## 2015-12-02 ENCOUNTER — Inpatient Hospital Stay (HOSPITAL_COMMUNITY): Payer: Medicare Other

## 2015-12-02 LAB — BASIC METABOLIC PANEL
Anion gap: 9 (ref 5–15)
BUN: 15 mg/dL (ref 6–20)
CO2: 26 mmol/L (ref 22–32)
Calcium: 8.5 mg/dL — ABNORMAL LOW (ref 8.9–10.3)
Chloride: 107 mmol/L (ref 101–111)
Creatinine, Ser: 1.16 mg/dL (ref 0.61–1.24)
GFR calc Af Amer: >60 mL/min (ref >60)
GFR calc non Af Amer: 58 mL/min — ABNORMAL LOW (ref >60)
Glucose, Bld: 85 mg/dL (ref 65–99)
Potassium: 3.5 mmol/L (ref 3.5–5.1)
Sodium: 142 mmol/L (ref 135–145)

## 2015-12-02 LAB — TYPE AND SCREEN
ABO/RH(D): A POS
Antibody Screen: NEGATIVE
Unit division: 0
Unit division: 0
Unit division: 0
Unit division: 0

## 2015-12-02 LAB — CBC
HCT: 32.2 % — ABNORMAL LOW (ref 39.0–52.0)
Hemoglobin: 10.7 g/dL — ABNORMAL LOW (ref 13.0–17.0)
MCH: 28.7 pg (ref 26.0–34.0)
MCHC: 33.2 g/dL (ref 30.0–36.0)
MCV: 86.3 fL (ref 78.0–100.0)
Platelets: 98 10*3/uL — ABNORMAL LOW (ref 150–400)
RBC: 3.73 MIL/uL — ABNORMAL LOW (ref 4.22–5.81)
RDW: 14.9 % (ref 11.5–15.5)
WBC: 9.7 10*3/uL (ref 4.0–10.5)

## 2015-12-02 LAB — GLUCOSE, CAPILLARY
GLUCOSE-CAPILLARY: 115 mg/dL — AB (ref 65–99)
Glucose-Capillary: 76 mg/dL (ref 65–99)
Glucose-Capillary: 75 mg/dL (ref 65–99)
Glucose-Capillary: 169 mg/dL — ABNORMAL HIGH (ref 65–99)
Glucose-Capillary: 180 mg/dL — ABNORMAL HIGH (ref 65–99)

## 2015-12-02 MED ORDER — INSULIN ASPART 100 UNIT/ML ~~LOC~~ SOLN
0.0000 [IU] | Freq: Three times a day (TID) | SUBCUTANEOUS | Status: DC
  Administered 2015-12-02 – 2015-12-03 (×2): 4 [IU] via SUBCUTANEOUS
  Administered 2015-12-03: 2 [IU] via SUBCUTANEOUS
  Administered 2015-12-03: 4 [IU] via SUBCUTANEOUS
  Administered 2015-12-04: 2 [IU] via SUBCUTANEOUS

## 2015-12-02 MED ORDER — POTASSIUM CHLORIDE CRYS ER 10 MEQ PO TBCR
EXTENDED_RELEASE_TABLET | ORAL | Status: AC
  Filled 2015-12-02: qty 1

## 2015-12-02 MED ORDER — POTASSIUM CHLORIDE CRYS ER 20 MEQ PO TBCR
20.0000 meq | EXTENDED_RELEASE_TABLET | ORAL | Status: DC | PRN
  Administered 2015-12-02: 20 meq via ORAL

## 2015-12-02 NOTE — Progress Notes (Signed)
Patient ID: Barry Officer Sr., male   DOB: 1932-11-09, 82 y.o.   MRN: [id number]  SICU Evening Rounds:  Hemodynamically stable today.  Remains paced  Ambulated well

## 2015-12-02 NOTE — Progress Notes (Signed)
3 Days Post-Op Procedure(s) (LRB): CORONARY ARTERY BYPASS GRAFTING (CABG), ON PUMP, TIMES FIVE, USING LEFT INTERNAL MAMMARY ARTERY, RIGHT GREATER SAPHENOUS VEIN HARVESTED ENDOSCOPICALLY (N/A) INTRAOPERATIVE TRANSESOPHAGEAL ECHOCARDIOGRAM (N/A) Subjective: No complaints. Walking well  Reportedly squirrelly overnight. Has a Actuary.  Objective: Vital signs in last 24 hours: Temp:  [97.4 F (36.3 C)-98.9 F (37.2 C)] 98.4 F (36.9 C) (09/23 0729) Pulse Rate:  [70-88] 84 (09/23 1000) Cardiac Rhythm: A-V Sequential paced (09/23 0800) Resp:  [16-24] 17 (09/23 1000) BP: (100-176)/(54-89) 139/83 (09/23 1000) SpO2:  [94 %-99 %] 95 % (09/23 1000) Weight:  [87 kg (191 lb 12.8 oz)] 87 kg (191 lb 12.8 oz) (09/23 0700)  Hemodynamic parameters for last 24 hours:    Intake/Output from previous day: 09/22 0701 - 09/23 0700 In: 788 [P.O.:600; I.V.:188] Out: 1575 [Urine:1575] Intake/Output this shift: Total I/O In: 120 [P.O.:120] Out: -   General appearance: alert and cooperative Neurologic: intact Heart: regular rate and rhythm, S1, S2 normal, no murmur, click, rub or gallop Lungs: rhonchi bilaterally Extremities: extremities normal, atraumatic, no cyanosis or edema Wound: Aquacel in place.  Lab Results:  Recent Labs  12/01/15 0420 12/02/15 0325  WBC 11.5* 9.7  HGB 10.0* 10.7*  HCT 31.6* 32.2*  PLT 92* 98*   BMET:  Recent Labs  12/01/15 0420 12/02/15 0325  NA 138 142  K 3.7 3.5  CL 107 107  CO2 23 26  GLUCOSE 116* 85  BUN 17 15  CREATININE 1.30* 1.16  CALCIUM 7.9* 8.5*    PT/INR:  Recent Labs  11/29/15 1520  LABPROT 19.1*  INR 1.59   ABG    Component Value Date/Time   PHART 7.372 11/30/2015 0946   HCO3 19.0 (L) 11/30/2015 0946   TCO2 20 11/30/2015 1645   ACIDBASEDEF 5.0 (H) 11/30/2015 0946   O2SAT 98.0 11/30/2015 0946   CBG (last 3)   Recent Labs  12/01/15 2338 12/02/15 0412 12/02/15 0841  GLUCAP 122* 76 75    Assessment/Plan: S/P Procedure(s)  (LRB): CORONARY ARTERY BYPASS GRAFTING (CABG), ON PUMP, TIMES FIVE, USING LEFT INTERNAL MAMMARY ARTERY, RIGHT GREATER SAPHENOUS VEIN HARVESTED ENDOSCOPICALLY (N/A) INTRAOPERATIVE TRANSESOPHAGEAL ECHOCARDIOGRAM (N/A)  He is hemodynamically stable POD 3. Rhythm under pacer is complete heart block with junctional escape in the 60 range. He had second degree Mobitz type 1 block postop and bifascicular block preop. He did get lopressor overnight despite pacing. Will discontinue lopressor and observe. He may need PPM. He is sensing A and pacing V now at 82.  Dementia/delirium stable  Weight is 3 lbs over preop.  Glucose under good control  Continue ambulation and IS.  I think he needs to stay in ICU with CHB and altered mental status because I am concerned that he may pull temp pacer wires out.     LOS: 7 days    Gaye Pollack 12/02/2015

## 2015-12-03 LAB — GLUCOSE, CAPILLARY
GLUCOSE-CAPILLARY: 158 mg/dL — AB (ref 65–99)
GLUCOSE-CAPILLARY: 167 mg/dL — AB (ref 65–99)
GLUCOSE-CAPILLARY: 229 mg/dL — AB (ref 65–99)

## 2015-12-03 LAB — BASIC METABOLIC PANEL
Anion gap: 8 (ref 5–15)
BUN: 14 mg/dL (ref 6–20)
CHLORIDE: 108 mmol/L (ref 101–111)
CO2: 24 mmol/L (ref 22–32)
Calcium: 8.1 mg/dL — ABNORMAL LOW (ref 8.9–10.3)
Creatinine, Ser: 0.94 mg/dL (ref 0.61–1.24)
GFR calc Af Amer: >60 mL/min (ref >60)
GFR calc non Af Amer: >60 mL/min (ref >60)
GLUCOSE: 183 mg/dL — AB (ref 65–99)
POTASSIUM: 3.8 mmol/L (ref 3.5–5.1)
Sodium: 140 mmol/L (ref 135–145)

## 2015-12-03 MED ORDER — INSULIN DETEMIR 100 UNIT/ML ~~LOC~~ SOLN
25.0000 [IU] | Freq: Every day | SUBCUTANEOUS | Status: DC
  Administered 2015-12-04 – 2015-12-06 (×3): 25 [IU] via SUBCUTANEOUS
  Filled 2015-12-03 (×3): qty 0.25

## 2015-12-03 NOTE — Progress Notes (Signed)
4 Days Post-Op Procedure(s) (LRB): CORONARY ARTERY BYPASS GRAFTING (CABG), ON PUMP, TIMES FIVE, USING LEFT INTERNAL MAMMARY ARTERY, RIGHT GREATER SAPHENOUS VEIN HARVESTED ENDOSCOPICALLY (N/A) INTRAOPERATIVE TRANSESOPHAGEAL ECHOCARDIOGRAM (N/A) Subjective:   Says he did not sleep much but otherwise no complaints.  Objective: Vital signs in last 24 hours: Temp:  [98 F (36.7 C)-98.3 F (36.8 C)] 98 F (36.7 C) (09/24 1100) Pulse Rate:  [67-93] 71 (09/24 1300) Cardiac Rhythm: Atrial flutter (09/24 0715) Resp:  [14-30] 20 (09/24 1300) BP: (104-154)/(64-91) 137/75 (09/24 1300) SpO2:  [93 %-100 %] 98 % (09/24 1300) Weight:  [85.5 kg (188 lb 7.9 oz)] 85.5 kg (188 lb 7.9 oz) (09/24 0600)  Hemodynamic parameters for last 24 hours:    Intake/Output from previous day: 09/23 0701 - 09/24 0700 In: 240 [P.O.:240] Out: 1100 [Urine:1100] Intake/Output this shift: No intake/output data recorded.  General appearance: alert and cooperative Neurologic: intact Heart: irregularly irregular rhythm Lungs: clear to auscultation bilaterally Extremities: extremities normal, atraumatic, no cyanosis or edema Wound: incision ok  Lab Results:  Recent Labs  12/01/15 0420 12/02/15 0325  WBC 11.5* 9.7  HGB 10.0* 10.7*  HCT 31.6* 32.2*  PLT 92* 98*   BMET:  Recent Labs  12/02/15 0325 12/03/15 0217  NA 142 140  K 3.5 3.8  CL 107 108  CO2 26 24  GLUCOSE 85 183*  BUN 15 14  CREATININE 1.16 0.94  CALCIUM 8.5* 8.1*    PT/INR: No results for input(s): LABPROT, INR in the last 72 hours. ABG    Component Value Date/Time   PHART 7.372 11/30/2015 0946   HCO3 19.0 (L) 11/30/2015 0946   TCO2 20 11/30/2015 1645   ACIDBASEDEF 5.0 (H) 11/30/2015 0946   O2SAT 98.0 11/30/2015 0946   CBG (last 3)   Recent Labs  12/02/15 1617 12/02/15 2110 12/03/15 1207  GLUCAP 169* 180* 158*    Assessment/Plan: S/P Procedure(s) (LRB): CORONARY ARTERY BYPASS GRAFTING (CABG), ON PUMP, TIMES FIVE, USING  LEFT INTERNAL MAMMARY ARTERY, RIGHT GREATER SAPHENOUS VEIN HARVESTED ENDOSCOPICALLY (N/A) INTRAOPERATIVE TRANSESOPHAGEAL ECHOCARDIOGRAM (N/A)  He is hemodynamically stable.  Went into atrial flutter with a controlled ventricular rate this am. Wilburn Mylar he was in an AV dissociated rhythm. I tried to rapid atrial pace him this am but I think it just put him into atrial fib. Since rate is controlled at 70 I think it best to leave him alone for now. Amio will likely put him back into a heart block rhythm. He will be better off with rate control although he is not a great candidate for anticoagulation  He is ambulating well.   Wt is back to baseline.  Glucose still a little high. Will increase Levemir.  Mental status seems good today.     LOS: 8 days    Gaye Pollack 12/03/2015

## 2015-12-03 NOTE — Progress Notes (Signed)
Patient ID: Barry Officer Sr., male   DOB: [birth year], 82 y.o.   MRN: [id number]   SICU Evening Rounds:   Hemodynamically stable in controlled atrial fib.   Urine output good    CBC    Component Value Date/Time   WBC 9.7 12/02/2015 0325   RBC 3.73 (L) 12/02/2015 0325   HGB 10.7 (L) 12/02/2015 0325   HCT 32.2 (L) 12/02/2015 0325   PLT 98 (L) 12/02/2015 0325   MCV 86.3 12/02/2015 0325   MCH 28.7 12/02/2015 0325   MCHC 33.2 12/02/2015 0325   RDW 14.9 12/02/2015 0325   LYMPHSABS 1.2 03/14/2011 1055   MONOABS 0.5 03/14/2011 1055   EOSABS 0.2 03/14/2011 1055   BASOSABS 0.0 03/14/2011 1055     BMET    Component Value Date/Time   NA 140 12/03/2015 0217   K 3.8 12/03/2015 0217   CL 108 12/03/2015 0217   CO2 24 12/03/2015 0217   GLUCOSE 183 (H) 12/03/2015 0217   BUN 14 12/03/2015 0217   CREATININE 0.94 12/03/2015 0217   CALCIUM 8.1 (L) 12/03/2015 0217   GFRNONAA >60 12/03/2015 0217   GFRAA >60 12/03/2015 0217     A/P:  Stable postop course. Continue current plans

## 2015-12-04 DIAGNOSIS — I080 Rheumatic disorders of both mitral and aortic valves: Secondary | ICD-10-CM

## 2015-12-04 DIAGNOSIS — I4892 Unspecified atrial flutter: Secondary | ICD-10-CM | POA: Diagnosis not present

## 2015-12-04 DIAGNOSIS — Z951 Presence of aortocoronary bypass graft: Secondary | ICD-10-CM

## 2015-12-04 DIAGNOSIS — I483 Typical atrial flutter: Secondary | ICD-10-CM

## 2015-12-04 LAB — GLUCOSE, CAPILLARY
GLUCOSE-CAPILLARY: 154 mg/dL — AB (ref 65–99)
GLUCOSE-CAPILLARY: 148 mg/dL — AB (ref 65–99)
Glucose-Capillary: 167 mg/dL — ABNORMAL HIGH (ref 65–99)
Glucose-Capillary: 140 mg/dL — ABNORMAL HIGH (ref 65–99)
Glucose-Capillary: 125 mg/dL — ABNORMAL HIGH (ref 65–99)

## 2015-12-04 MED ORDER — BISACODYL 10 MG RE SUPP: 10.0000 mg | Freq: Every day | RECTAL | Status: DC | PRN

## 2015-12-04 MED ORDER — MOVING RIGHT ALONG BOOK
Freq: Once | Status: AC
  Administered 2015-12-04: 10:00:00
  Filled 2015-12-04: qty 1

## 2015-12-04 MED ORDER — ONDANSETRON HCL 4 MG/2ML IJ SOLN: 4.0000 mg | Freq: Four times a day (QID) | INTRAMUSCULAR | Status: DC | PRN

## 2015-12-04 MED ORDER — ASPIRIN EC 325 MG PO TBEC
325.0000 mg | DELAYED_RELEASE_TABLET | Freq: Every day | ORAL | Status: DC
Start: 2015-12-04 — End: 2015-12-05
  Administered 2015-12-04: 325 mg via ORAL
  Filled 2015-12-04: qty 1

## 2015-12-04 MED ORDER — INSULIN ASPART 100 UNIT/ML ~~LOC~~ SOLN
0.0000 [IU] | Freq: Three times a day (TID) | SUBCUTANEOUS | Status: DC
  Administered 2015-12-04 (×3): 2 [IU] via SUBCUTANEOUS
  Administered 2015-12-05: 5 [IU] via SUBCUTANEOUS
  Administered 2015-12-05 – 2015-12-06 (×3): 2 [IU] via SUBCUTANEOUS
  Administered 2015-12-06: 4 [IU] via SUBCUTANEOUS

## 2015-12-04 MED ORDER — SODIUM CHLORIDE 0.9% FLUSH
3.0000 mL | Freq: Two times a day (BID) | INTRAVENOUS | Status: DC
  Administered 2015-12-04 – 2015-12-06 (×6): 3 mL via INTRAVENOUS

## 2015-12-04 MED ORDER — LEVALBUTEROL HCL 0.63 MG/3ML IN NEBU
0.6300 mg | INHALATION_SOLUTION | Freq: Two times a day (BID) | RESPIRATORY_TRACT | Status: DC
  Administered 2015-12-04 – 2015-12-07 (×6): 0.63 mg via RESPIRATORY_TRACT
  Filled 2015-12-04 (×6): qty 3

## 2015-12-04 MED ORDER — BISACODYL 5 MG PO TBEC
10.0000 mg | DELAYED_RELEASE_TABLET | Freq: Every day | ORAL | Status: DC | PRN
  Administered 2015-12-06: 10 mg via ORAL
  Filled 2015-12-04: qty 2

## 2015-12-04 MED ORDER — GUAIFENESIN ER 600 MG PO TB12: 600.0000 mg | ORAL_TABLET | Freq: Two times a day (BID) | ORAL | Status: DC | PRN

## 2015-12-04 MED ORDER — PANTOPRAZOLE SODIUM 40 MG PO TBEC
40.0000 mg | DELAYED_RELEASE_TABLET | Freq: Every day | ORAL | Status: DC
  Administered 2015-12-04 – 2015-12-07 (×4): 40 mg via ORAL
  Filled 2015-12-04 (×4): qty 1

## 2015-12-04 MED ORDER — TRAMADOL HCL 50 MG PO TABS
50.0000 mg | ORAL_TABLET | Freq: Four times a day (QID) | ORAL | Status: DC
  Administered 2015-12-04 – 2015-12-07 (×12): 50 mg via ORAL
  Filled 2015-12-04 (×12): qty 1

## 2015-12-04 MED ORDER — ONDANSETRON HCL 4 MG PO TABS: 4.0000 mg | ORAL_TABLET | Freq: Four times a day (QID) | ORAL | Status: DC | PRN

## 2015-12-04 MED ORDER — SODIUM CHLORIDE 0.9% FLUSH: 3.0000 mL | INTRAVENOUS | Status: DC | PRN

## 2015-12-04 MED ORDER — SODIUM CHLORIDE 0.9 % IV SOLN: 250.0000 mL | INTRAVENOUS | Status: DC | PRN

## 2015-12-04 MED ORDER — POTASSIUM CHLORIDE CRYS ER 20 MEQ PO TBCR
20.0000 meq | EXTENDED_RELEASE_TABLET | Freq: Once | ORAL | Status: AC
  Administered 2015-12-04: 20 meq via ORAL
  Filled 2015-12-04: qty 1

## 2015-12-04 NOTE — Progress Notes (Signed)
Pt received to 2w pt alert and oriented to the unit. Vital signs taken. Pt educated on calling nurse for help will advise.

## 2015-12-04 NOTE — Progress Notes (Signed)
Patient ID: Barry Officer Sr., male   DOB: 08/27/1932, 82 y.o.   MRN: [id number] TCTS DAILY ICU PROGRESS NOTE                   [address]            [place] [address]          [phone]   5 Days Post-Op Procedure(s) (LRB): CORONARY ARTERY BYPASS GRAFTING (CABG), ON PUMP, TIMES FIVE, USING LEFT INTERNAL MAMMARY ARTERY, RIGHT GREATER SAPHENOUS VEIN HARVESTED ENDOSCOPICALLY (N/A) INTRAOPERATIVE TRANSESOPHAGEAL ECHOCARDIOGRAM (N/A)  Total Length of Stay:  LOS: 9 days   Subjective: Awake and alert , says he is ready to go home and go fishing  Objective: Vital signs in last 24 hours: Temp:  [97.5 F (36.4 C)-98.9 F (37.2 C)] 98 F (36.7 C) (09/25 0400) Pulse Rate:  [48-75] 62 (09/25 0800) Cardiac Rhythm: Atrial flutter (09/24 2000) Resp:  [17-30] 17 (09/25 0800) BP: (105-155)/(64-96) 148/68 (09/25 0600) SpO2:  [86 %-100 %] 97 % (09/25 0800) Weight:  [186 lb 8.2 oz (84.6 kg)] 186 lb 8.2 oz (84.6 kg) (09/25 0500)  Filed Weights   12/02/15 0700 12/03/15 0600 12/04/15 0500  Weight: 191 lb 12.8 oz (87 kg) 188 lb 7.9 oz (85.5 kg) 186 lb 8.2 oz (84.6 kg)    Weight change: -1 lb 15.7 oz (-0.9 kg)   Hemodynamic parameters for last 24 hours:    Intake/Output from previous day: 09/24 0701 - 09/25 0700 In: 600 [P.O.:600] Out: 575 [Urine:575]  Intake/Output this shift: No intake/output data recorded.  Current Meds: Scheduled Meds: . acetaminophen  1,000 mg Oral Q6H   Or  . acetaminophen (TYLENOL) oral liquid 160 mg/5 mL  1,000 mg Per Tube Q6H  . aspirin EC  325 mg Oral Daily   Or  . aspirin  324 mg Per Tube Daily  . bisacodyl  10 mg Oral Daily   Or  . bisacodyl  10 mg Rectal Daily  . docusate sodium  200 mg Oral Daily  . enoxaparin (LOVENOX) injection  30 mg Subcutaneous QHS  . finasteride  5 mg Oral Daily  . gabapentin  300 mg Oral BID  . insulin aspart  0-24 Units Subcutaneous TID WC  . insulin detemir  25 Units Subcutaneous Daily  . levalbuterol   0.63 mg Nebulization TID  . levothyroxine  112 mcg Oral QAC breakfast  . mometasone-formoterol  2 puff Inhalation BID  . pantoprazole  40 mg Oral Daily  . rosuvastatin  10 mg Oral Daily  . sodium chloride flush  3 mL Intravenous Q12H   Continuous Infusions: . sodium chloride Stopped (11/30/15 1500)  . sodium chloride    . sodium chloride Stopped (11/30/15 1500)  . lactated ringers Stopped (12/01/15 1500)  . lactated ringers Stopped (11/30/15 0000)  . nitroGLYCERIN Stopped (12/01/15 1300)   PRN Meds:.sodium chloride, lactated ringers, morphine injection, ondansetron (ZOFRAN) IV, oxyCODONE, potassium chloride, sodium chloride flush, traMADol  General appearance: alert, cooperative and no distress Neurologic: intact Heart: irregularly irregular rhythm Lungs: diminished breath sounds bibasilar Abdomen: soft, non-tender; bowel sounds normal; no masses,  no organomegaly Extremities: extremities normal, atraumatic, no cyanosis or edema and Homans sign is negative, no sign of DVT Wound: intact sternum  Lab Results: CBC: Recent Labs  12/02/15 0325  WBC 9.7  HGB 10.7*  HCT 32.2*  PLT 98*   BMET:  Recent Labs  12/02/15 0325 12/03/15 0217  NA 142 140  K  3.5 3.8  CL 107 108  CO2 26 24  GLUCOSE 85 183*  BUN 15 14  CREATININE 1.16 0.94  CALCIUM 8.5* 8.1*    CMET: Lab Results  Component Value Date   WBC 9.7 12/02/2015   HGB 10.7 (L) 12/02/2015   HCT 32.2 (L) 12/02/2015   PLT 98 (L) 12/02/2015   GLUCOSE 183 (H) 12/03/2015   CHOL 117 10/31/2015   TRIG 98.0 10/31/2015   HDL 44.60 10/31/2015   LDLCALC 53 10/31/2015   ALT 18 11/28/2015   AST 19 11/28/2015   NA 140 12/03/2015   K 3.8 12/03/2015   CL 108 12/03/2015   CREATININE 0.94 12/03/2015   BUN 14 12/03/2015   CO2 24 12/03/2015   TSH 0.873 11/26/2015   INR 1.59 11/29/2015   HGBA1C 7.0 (H) 11/28/2015   MICROALBUR 2.9 (H) 03/08/2015    PT/INR: No results for input(s): LABPROT, INR in the last 72  hours. Radiology: No results found.   Assessment/Plan: S/P Procedure(s) (LRB): CORONARY ARTERY BYPASS GRAFTING (CABG), ON PUMP, TIMES FIVE, USING LEFT INTERNAL MAMMARY ARTERY, RIGHT GREATER SAPHENOUS VEIN HARVESTED ENDOSCOPICALLY (N/A) INTRAOPERATIVE TRANSESOPHAGEAL ECHOCARDIOGRAM (N/A) Mobilize Diuresis Diabetes control Plan for transfer to step-down: see transfer orders Cardiology to follow up rhythm     [name] 12/04/2015 8:24 AM

## 2015-12-04 NOTE — Progress Notes (Signed)
  DAILY PROGRESS NOTE  Subjective:  Feels better today. Now in rate-controlled atrial flutter in the 80's. EKG performed while I was in the room.  Objective:  Temp:  [97.5 F (36.4 C)-98.9 F (37.2 C)] 98.1 F (36.7 C) (09/25 1145) Pulse Rate:  [48-75] 58 (09/25 1400) Resp:  [16-27] 22 (09/25 1400) BP: (124-155)/(64-96) 124/65 (09/25 1400) SpO2:  [86 %-100 %] 97 % (09/25 1400) Weight:  [186 lb 8.2 oz (84.6 kg)] 186 lb 8.2 oz (84.6 kg) (09/25 0500) Weight change: -1 lb 15.7 oz (-0.9 kg)  Intake/Output from previous day: 09/24 0701 - 09/25 0700 In: 600 [P.O.:600] Out: 575 [Urine:575]  Intake/Output from this shift: Total I/O In: 120 [P.O.:120] Out: -   Medications: No current facility-administered medications on file prior to encounter.    Current Outpatient Prescriptions on File Prior to Encounter  Medication Sig Dispense Refill  . acetaminophen (TYLENOL) 500 MG tablet Take 500 mg by mouth every 8 (eight) hours as needed for mild pain, moderate pain or headache.     . aspirin EC 81 MG tablet Take 81 mg by mouth daily.     . budesonide-formoterol (SYMBICORT) 160-4.5 MCG/ACT inhaler Inhale 2 puffs into the lungs 2 (two) times daily. 3 Inhaler 3  . CRESTOR 10 MG tablet TAKE ONE TABLET BY MOUTH ONCE DAILY 90 tablet 1  . dextromethorphan-guaiFENesin (TUSSIN DM) 10-100 MG/5ML liquid Take 10 mLs by mouth every 4 (four) hours as needed for cough.     . finasteride (PROSCAR) 5 MG tablet Take 5 mg by mouth every morning.     . gabapentin (NEURONTIN) 300 MG capsule TAKE ONE CAPSULE BY MOUTH 4 TIMES DAILY (Patient taking differently: Take 300 mg by mouth in the morning and 300 mg in the evening (may also takes 2 more times daily as needed for nerve pain)) 120 capsule 3  . glucose blood (ONE TOUCH ULTRA TEST) test strip Use as instructed to check blood sugar 3 times per day dx code E11.65 100 each 5  . HUMALOG MIX 75/25 KWIKPEN (75-25) 100 UNIT/ML Kwikpen INJECT 20 UNITS SUBCUTANEOUSLY  IN THE MORNING AND 30 UNITS SUBCUTANEOUSLY IN THE EVENING DAILY (Patient taking differently: Inject 20 units into the skin in the morning then 14 units in the afternoon then 16 units at bedtime) 45 mL 0  . levothyroxine (SYNTHROID, LEVOTHROID) 112 MCG tablet TAKE ONE TABLET BY MOUTH ONCE DAILY (Patient taking differently: Take 112 mcg by mouth once a day) 90 tablet 0  . menthol-thymol (ABSORBINE JR) LIQD Apply 1 application topically as needed. For pain.    . metFORMIN (GLUCOPHAGE-XR) 500 MG 24 hr tablet TAKE THREE TABLETS BY MOUTH ONCE DAILY WITH SUPPER (Patient taking differently: Take 1,000 mg by mouth in the evening) 90 tablet 5  . oxyCODONE-acetaminophen (ROXICET) 5-325 MG tablet Take 1 tablet by mouth every 8 (eight) hours as needed for severe pain. 50 tablet 0  . polyethylene glycol (MIRALAX / GLYCOLAX) packet Take 17 g by mouth daily as needed for mild constipation, moderate constipation or severe constipation. 1 capful daily as needed    . TOUJEO SOLOSTAR 300 UNIT/ML SOPN INJECT 42 UNITS SUBCUTANEOUSLY EVERY MORNING (Patient taking differently: Inject 32 units into the skin in the morning) 6 mL 3  . triamcinolone (NASACORT AQ) 55 MCG/ACT AERO nasal inhaler Place 1-2 sprays into the nose daily.     . vitamin B-12 (CYANOCOBALAMIN) 1000 MCG tablet Take 1,000 mcg by mouth daily.    . QUEtiapine (SEROQUEL) 25   MG tablet Take 1 tablet (25 mg total) by mouth at bedtime. (Patient not taking: Reported on 11/25/2015) 30 tablet 1    Physical Exam: General appearance: alert and no distress Lungs: clear to auscultation bilaterally Heart: irregularly irregular rhythm Extremities: extremities normal, atraumatic, no cyanosis or edema Neurologic: Grossly normal  Lab Results: Results for orders placed or performed during the hospital encounter of 11/25/15 (from the past 48 hour(s))  Glucose, capillary     Status: Abnormal   Collection Time: 12/02/15  4:17 PM  Result Value Ref Range   Glucose-Capillary  169 (H) 65 - 99 mg/dL   Comment 1 Notify RN   Glucose, capillary     Status: Abnormal   Collection Time: 12/02/15  9:10 PM  Result Value Ref Range   Glucose-Capillary 180 (H) 65 - 99 mg/dL   Comment 1 Capillary Specimen    Comment 2 Notify RN   Basic metabolic panel     Status: Abnormal   Collection Time: 12/03/15  2:17 AM  Result Value Ref Range   Sodium 140 135 - 145 mmol/L   Potassium 3.8 3.5 - 5.1 mmol/L   Chloride 108 101 - 111 mmol/L   CO2 24 22 - 32 mmol/L   Glucose, Bld 183 (H) 65 - 99 mg/dL   BUN 14 6 - 20 mg/dL   Creatinine, Ser 0.94 0.61 - 1.24 mg/dL   Calcium 8.1 (L) 8.9 - 10.3 mg/dL   GFR calc non Af Amer >60 >60 mL/min   GFR calc Af Amer >60 >60 mL/min    Comment: (NOTE) The eGFR has been calculated using the CKD EPI equation. This calculation has not been validated in all clinical situations. eGFR's persistently <60 mL/min signify possible Chronic Kidney Disease.    Anion gap 8 5 - 15  Glucose, capillary     Status: Abnormal   Collection Time: 12/03/15  8:44 AM  Result Value Ref Range   Glucose-Capillary 167 (H) 65 - 99 mg/dL   Comment 1 Notify RN   Glucose, capillary     Status: Abnormal   Collection Time: 12/03/15 12:07 PM  Result Value Ref Range   Glucose-Capillary 158 (H) 65 - 99 mg/dL   Comment 1 Notify RN   Glucose, capillary     Status: Abnormal   Collection Time: 12/03/15  4:22 PM  Result Value Ref Range   Glucose-Capillary 167 (H) 65 - 99 mg/dL   Comment 1 Notify RN   Glucose, capillary     Status: Abnormal   Collection Time: 12/03/15 10:01 PM  Result Value Ref Range   Glucose-Capillary 229 (H) 65 - 99 mg/dL   Comment 1 Notify RN    Comment 2 Document in Chart   Glucose, capillary     Status: Abnormal   Collection Time: 12/04/15  8:43 AM  Result Value Ref Range   Glucose-Capillary 140 (H) 65 - 99 mg/dL   Comment 1 Capillary Specimen    Comment 2 Notify RN   Glucose, capillary     Status: Abnormal   Collection Time: 12/04/15 11:42 AM    Result Value Ref Range   Glucose-Capillary 154 (H) 65 - 99 mg/dL   Comment 1 Capillary Specimen    Comment 2 Notify RN     Imaging: No results found.  Assessment:  1. Principal Problem: 2.   NSTEMI (non-ST elevated myocardial infarction) ([place]) 3. Active Problems: 4.   Type II diabetes mellitus, uncontrolled ([place]) 5.   Mitral regurgitation and aortic stenosis  6.   Pure hypercholesterolemia 7.   Hypothyroidism 8.   Irregular heart beat 9.   Iron deficiency anemia 10.   Cough variant asthma 11.   CKD (chronic kidney disease), stage III 12.   S/P CABG (coronary artery bypass graft) 13.   Plan:  1. Now in rate-controlled atrial flutter. Attempts at overdrive pacing were not successful. Agree that given recent AV dissociation and history of "trifascicular block" I would avoid amiodarone as well. Currently he is rate-controlled due to intrinsic AV block. Would recommend anticoagulation and outpatient cardioversion attempt if he does not spontaneously convert in 3 weeks. Suggest reducing aspirin to 81 mg daily and starting Eliquis 5 mg BID. Will d/w with Dr. Gerhardt.   Time Spent Directly with Patient:  15 minutes  Length of Stay:  LOS: 9 days   Kenneth C. Hilty, MD, FACC Attending Cardiologist [facility]  Kenneth C Hilty 12/04/2015, 2:34 PM    

## 2015-12-04 NOTE — Significant Event (Signed)
Patient transfer to 2W25, taken via wheelchair by RN Claris Pong. Report given to receiving RN Wynetta Fines. All personal belongings taken with patient's spouse. VS stable prior to leaving unit.

## 2015-12-04 NOTE — Care Management Important Message (Signed)
Important Message  Patient Details  Name: Barry JOSEY Sr. MRN: [id number] Date of Birth: September 16, 1932   Medicare Important Message Given:  Yes    [name] 12/04/2015, 10:56 AM

## 2015-12-05 ENCOUNTER — Inpatient Hospital Stay (HOSPITAL_COMMUNITY): Payer: Medicare Other

## 2015-12-05 LAB — CBC
HCT: 34.1 % — ABNORMAL LOW (ref 39.0–52.0)
Hemoglobin: 10.7 g/dL — ABNORMAL LOW (ref 13.0–17.0)
MCH: 27.2 pg (ref 26.0–34.0)
MCHC: 31.4 g/dL (ref 30.0–36.0)
MCV: 86.5 fL (ref 78.0–100.0)
Platelets: 202 10*3/uL (ref 150–400)
RBC: 3.94 MIL/uL — ABNORMAL LOW (ref 4.22–5.81)
RDW: 15 % (ref 11.5–15.5)
WBC: 8.2 10*3/uL (ref 4.0–10.5)

## 2015-12-05 LAB — TRANSFUSION REACTION
DAT C3: NEGATIVE
POST RXN DAT IGG: NEGATIVE

## 2015-12-05 LAB — BASIC METABOLIC PANEL
Anion gap: 8 (ref 5–15)
BUN: 10 mg/dL (ref 6–20)
CO2: 23 mmol/L (ref 22–32)
Calcium: 8.1 mg/dL — ABNORMAL LOW (ref 8.9–10.3)
Chloride: 108 mmol/L (ref 101–111)
Creatinine, Ser: 0.93 mg/dL (ref 0.61–1.24)
GFR calc Af Amer: >60 mL/min (ref >60)
GFR calc non Af Amer: >60 mL/min (ref >60)
Glucose, Bld: 140 mg/dL — ABNORMAL HIGH (ref 65–99)
Potassium: 3.4 mmol/L — ABNORMAL LOW (ref 3.5–5.1)
Sodium: 139 mmol/L (ref 135–145)

## 2015-12-05 LAB — GLUCOSE, CAPILLARY
GLUCOSE-CAPILLARY: 117 mg/dL — AB (ref 65–99)
GLUCOSE-CAPILLARY: 151 mg/dL — AB (ref 65–99)
Glucose-Capillary: 135 mg/dL — ABNORMAL HIGH (ref 65–99)
Glucose-Capillary: 127 mg/dL — ABNORMAL HIGH (ref 65–99)

## 2015-12-05 MED ORDER — APIXABAN 5 MG PO TABS
5.0000 mg | ORAL_TABLET | Freq: Two times a day (BID) | ORAL | Status: DC
  Administered 2015-12-05 – 2015-12-07 (×4): 5 mg via ORAL
  Filled 2015-12-05: qty 2
  Filled 2015-12-05: qty 1
  Filled 2015-12-05: qty 2
  Filled 2015-12-05: qty 1

## 2015-12-05 MED ORDER — ASPIRIN EC 81 MG PO TBEC
81.0000 mg | DELAYED_RELEASE_TABLET | Freq: Every day | ORAL | Status: DC
  Administered 2015-12-05 – 2015-12-07 (×3): 81 mg via ORAL
  Filled 2015-12-05 (×3): qty 1

## 2015-12-05 NOTE — Progress Notes (Signed)
Patient Name: Barry NISBET Sr. Date of Encounter: 12/05/2015   SUBJECTIVE  Feeling better everyday. No chest pain or sob.   CURRENT MEDS . aspirin EC  81 mg Oral Daily  . enoxaparin (LOVENOX) injection  30 mg Subcutaneous QHS  . finasteride  5 mg Oral Daily  . gabapentin  300 mg Oral BID  . insulin aspart  0-24 Units Subcutaneous TID AC & HS  . insulin detemir  25 Units Subcutaneous Daily  . levalbuterol  0.63 mg Nebulization BID  . levothyroxine  112 mcg Oral QAC breakfast  . mometasone-formoterol  2 puff Inhalation BID  . pantoprazole  40 mg Oral QAC breakfast  . rosuvastatin  10 mg Oral Daily  . sodium chloride flush  3 mL Intravenous Q12H  . traMADol  50 mg Oral Q6H    OBJECTIVE  Vitals:   12/05/15 1000 12/05/15 1030 12/05/15 1045 12/05/15 1100  BP: (!) 151/79 (!) 145/76 (!) 148/78 (!) 162/77  Pulse: 62 72 70 64  Resp:      Temp:      TempSrc:      SpO2: 100% 100% 99% 100%  Weight:      Height:        Intake/Output Summary (Last 24 hours) at 12/05/15 1128 Last data filed at 12/04/15 2100  Gross per 24 hour  Intake              480 ml  Output              550 ml  Net              -70 ml   Filed Weights   12/03/15 0600 12/04/15 0500 12/05/15 0608  Weight: 188 lb 7.9 oz (85.5 kg) 186 lb 8.2 oz (84.6 kg) 185 lb 6.4 oz (84.1 kg)    PHYSICAL EXAM  General: Pleasant, NAD. Neuro: Alert and oriented X 3. Moves all extremities spontaneously. Psych: Normal affect. HEENT:  Normal  Neck: Supple without bruits or JVD. Lungs:  Resp regular and unlabored, CTA. Surgical wound.  Heart: Irregular  no s3, s4, or murmurs. Abdomen: Soft, non-tender, non-distended, BS + x 4.  Extremities: No clubbing, cyanosis or edema. DP/PT/Radials 2+ and equal bilaterally.  Accessory Clinical Findings  CBC  Recent Labs  12/05/15 0233  WBC 8.2  HGB 10.7*  HCT 34.1*  MCV 86.5  PLT 123XX123   Basic Metabolic Panel  Recent Labs  12/03/15 0217 12/05/15 0233  NA 140 139  K 3.8  3.4*  CL 108 108  CO2 24 23  GLUCOSE 183* 140*  BUN 14 10  CREATININE 0.94 0.93  CALCIUM 8.1* 8.1*   Liver Function Tests No results for input(s): AST, ALT, ALKPHOS, BILITOT, PROT, ALBUMIN in the last 72 hours. No results for input(s): LIPASE, AMYLASE in the last 72 hours. Cardiac Enzymes No results for input(s): CKTOTAL, CKMB, CKMBINDEX, TROPONINI in the last 72 hours. BNP Invalid input(s): POCBNP D-Dimer No results for input(s): DDIMER in the last 72 hours. Hemoglobin A1C No results for input(s): HGBA1C in the last 72 hours. Fasting Lipid Panel No results for input(s): CHOL, HDL, LDLCALC, TRIG, CHOLHDL, LDLDIRECT in the last 72 hours. Thyroid Function Tests No results for input(s): TSH, T4TOTAL, T3FREE, THYROIDAB in the last 72 hours.  Invalid input(s): FREET3  TELE  Aflutter in rate of 70-80s  Radiology/Studies  Dg Chest 2 View  Result Date: 12/05/2015 CLINICAL DATA:  Status post CABG on November 29, 2015. The patient  reports mild shortness of breath today. EXAM: CHEST  2 VIEW COMPARISON:  Portable chest x-ray of December 02, 2015 FINDINGS: The lungs are well-expanded. Small bilateral pleural effusions blunt the posterior and lateral costophrenic angles and thicken the major fissure. The heart is top-normal in size. Left lower lobe atelectasis persists. There is no pneumothorax. The mediastinum is normal in width. The retrosternal soft tissues appear normal. There is calcification in the wall of the aortic arch. The bony thorax exhibits no acute abnormality. IMPRESSION: Further improvement in left lower lobe atelectasis or pneumonia. There are persistent small bilateral pleural effusions layering posteriorly. No pulmonary edema or pneumothorax. Aortic atherosclerosis. Electronically Signed   By: David  [name] M.D.   On: 12/05/2015 08:24   Dg Chest Port 1 View  Result Date: 12/02/2015 CLINICAL DATA:  Subsequent encounter for shortness of breath. EXAM: PORTABLE CHEST 1 VIEW  COMPARISON:  12/01/2015 FINDINGS: Cardiac silhouette is top-normal in size. No mediastinal widening. No convincing mediastinal or hilar masses. Lungs are hyperexpanded. There is interstitial thickening in the bases. Residual atelectasis is noted in the lung bases, left greater than right. No pulmonary edema. No significant pleural effusion. Left chest tube has been removed.  There is no pneumothorax. IMPRESSION: 1. No acute findings.  No mediastinal widening or pulmonary edema. 2. No pneumothorax following removal of the left chest tube. 3. Mild lung base opacity, left greater than right, consistent with atelectasis. Stable changes of COPD. Electronically Signed   By: Lajean Manes M.D.   On: 12/02/2015 07:57   Dg Chest Port 1 View  Result Date: 12/01/2015 CLINICAL DATA:  Evaluate chest tube placement EXAM: PORTABLE CHEST 1 VIEW COMPARISON:  11/30/2015 FINDINGS: Cardiac shadow remains enlarged. There is improved aeration in the bases bilaterally. The left chest tube is again identified in satisfactory position. Reduction in left pleural effusion and left basilar atelectasis is seen. No pneumothorax is noted. The Swan-Ganz catheter endotracheal tube and nasogastric catheter have been removed in the interval. Mediastinal drain is also been removed in the interval. Right jugular sheath remains. IMPRESSION: Significant improvement in the bases bilaterally. No significant residual infiltrate is seen. No pneumothorax is noted. Electronically Signed   By: Inez Catalina M.D.   On: 12/01/2015 08:38   Dg Chest Port 1 View  Result Date: 11/30/2015 CLINICAL DATA:  Hypoxia EXAM: PORTABLE CHEST 1 VIEW COMPARISON:  November 29, 2015 FINDINGS: Endotracheal tube tip is 3.1 cm above the carina. Swan-Ganz catheter tip is in the main pulmonary outflow tract. Nasogastric tube tip and side port are below the diaphragm. There is a persistent mediastinal drain and left chest tube. No pneumothorax. There is now interstitial and  patchy alveolar edema in the lower lung zones with pleural effusions bilaterally. There is cardiomegaly with pulmonary venous hypertension. There is atherosclerotic calcification in the aorta. IMPRESSION: Tube and catheter positions as described. No pneumothorax. Evidence of congestive heart failure. Aortic atherosclerosis. Electronically Signed   By: Lowella Grip III M.D.   On: 11/30/2015 08:41   Dg Chest Port 1 View  Result Date: 11/29/2015 CLINICAL DATA:  Status post cardiac surgery EXAM: PORTABLE CHEST 1 VIEW COMPARISON:  Four days ago FINDINGS: Endotracheal tube tip is just below the clavicular heads. An orogastric tube continues to the diaphragm at least. Swan-Ganz catheter from right IJ approach with tip directed towards the right main pulmonary artery. Thoracic drains in place. Mild pulmonary edema. Dependent atelectasis. No visible pneumothorax. Stable cardiopericardial size. New changes of CABG. IMPRESSION: 1. Unremarkable positioning of  tubes and central line as described. 2. Mild atelectasis and pulmonary edema. Electronically Signed   By: Monte Fantasia M.D.   On: 11/29/2015 15:49   Dg Chest Port 1 View  Result Date: 11/25/2015 CLINICAL DATA:  Chest pain, shortness of breath, pain down both arms all day, hypertension, diabetes mellitus, asthma, prior MI, prostate cancer EXAM: PORTABLE CHEST 1 VIEW COMPARISON:  Portable exam 1832 hours compared 10/12/2013 FINDINGS: Enlargement of cardiac silhouette with minimal vascular congestion. Atherosclerotic calcification aorta. Emphysematous changes compatible with COPD. Interstitial changes at the lung bases bilaterally question interstitial infiltrates and edema with probable component of atelectasis as well. Upper lungs clear. No pleural effusion or pneumothorax. Bones demineralized. IMPRESSION: Enlargement of cardiac silhouette with minimal vascular congestion. Aortic atherosclerosis. COPD changes with mild interstitial edema at the lung bases  and probable component of atelectasis. Electronically Signed   By: Lavonia Dana M.D.   On: 11/25/2015 18:41    ASSESSMENT AND PLAN Principal Problem:   NSTEMI (non-ST elevated myocardial infarction) ([place]) Active Problems:   Type II diabetes mellitus, uncontrolled ([facility])   Mitral regurgitation and aortic stenosis   Pure hypercholesterolemia   Hypothyroidism   Irregular heart beat   Iron deficiency anemia   Cough variant asthma   CKD (chronic kidney disease), stage III   S/P CABG (coronary artery bypass graft)   Atrial flutter ([place])    Plan: Rate is stable. Not a candidate for amiodarone or AV blocking agent. Continue ASA 81mg . Start Eliquis 5mg  BID when ok with surgery. Likely DCCV in 3-4 weeks.   Jarrett Soho PA-C Pager [phone]

## 2015-12-05 NOTE — Progress Notes (Signed)
Physical Therapy Treatment Patient Details Name: Barry KRUS Sr. MRN: [id number] DOB: [birth year] Today's Date: 12/05/2015    History of Present Illness 82 yo admitted with NSTEMI s/p CABGx3 on 9/21. PMHx:CAD, DM, HLD, HTN, AS, RBBB, prostate CA, hypothyroidism    PT Comments    Pt with decreased recall of precautions at beginning of session and end of session. Pt and wife educated for all precautions, mobility, transfers, gait and stairs. Pt demonstrates lack of carry over and confusion as greatest obstacle for progression of mobility. Will continue to follow to maximize independence and awareness of precautions.   BP 164/84 before 160/106 after HR 69-95  Follow Up Recommendations  Home health PT;Supervision/Assistance - 24 hour     Equipment Recommendations       Recommendations for Other Services       Precautions / Restrictions Precautions Precautions: Sternal;Fall Restrictions Weight Bearing Restrictions: Yes    Mobility  Bed Mobility Overal bed mobility: Needs Assistance Bed Mobility: Rolling;Sidelying to Sit Rolling: Min guard Sidelying to sit: Min assist       General bed mobility comments: cues for sequence with assist to fully elevate trunk from surface  Transfers Overall transfer level: Needs assistance   Transfers: Sit to/from Stand Sit to Stand: Min guard         General transfer comment: cues for hand placement, safety  Ambulation/Gait Ambulation/Gait assistance: Min guard Ambulation Distance (Feet): 350 Feet Assistive device: None Gait Pattern/deviations: Step-through pattern;Wide base of support   Gait velocity interpretation: Below normal speed for age/gender General Gait Details: pt denied using RW, able to maintain balance and needed directional cues   Stairs Stairs: Yes Stairs assistance: Min assist Stair Management: Step to pattern;Forwards Number of Stairs: 6 General stair comments: hand held assist with other hand on wall as  no rails at home, assist for balance and cues for safety  Wheelchair Mobility    Modified Rankin (Stroke Patients Only)       Balance                                    Cognition Arousal/Alertness: Awake/alert Behavior During Therapy: WFL for tasks assessed/performed Overall Cognitive Status: Impaired/Different from baseline Area of Impairment: Memory;Orientation Orientation Level: Time   Memory: Decreased short-term memory;Decreased recall of precautions              Exercises General Exercises - Lower Extremity Long Arc Quad: AROM;15 reps;Both;Seated Hip Flexion/Marching: AROM;15 reps;Both;Seated    General Comments        Pertinent Vitals/Pain Pain Assessment: No/denies pain    Home Living                      Prior Function            PT Goals (current goals can now be found in the care plan section) Progress towards PT goals: Progressing toward goals    Frequency           PT Plan Current plan remains appropriate    Co-evaluation             End of Session Equipment Utilized During Treatment: Gait belt Activity Tolerance: Patient tolerated treatment well Patient left: in chair;with call bell/phone within reach;with family/visitor present     Time: 1221-1240 PT Time Calculation (min) (ACUTE ONLY): 19 min  Charges:  $Gait Training: 8-22 mins  G CodesMelford Aase 2015/12/23, 12:46 PM Elwyn Reach, [place]

## 2015-12-05 NOTE — Evaluation (Signed)
Occupational Therapy Evaluation Patient Details Name: Barry DORAN Sr. MRN: [id number] DOB: September 03, 1932 Today's Date: 12/05/2015    History of Present Illness 82 yo admitted with NSTEMI s/p CABGx3 on 9/21. PMHx:CAD, DM, HLD, HTN, AS, RBBB, prostate CA, hypothyroidism   Clinical Impression   PTA, pt was independent with all ADLs and mobility. Pt currently requires min assist for LB dressing and bathing tasks and supervision for basic transfers. Pt presents with generalized weakness, mild balance deficits, decreased recall of sternal precautions, and decreased safety awareness. Reviewed sternal precautions during functional activities and educated pt/wife on compensatory strategies for ADLs and fall prevention strategies. Pt plans to d/c home with 24/7 assistance from his wife. Pt will benefit from continued acute OT to increase independence and safety with ADLs and mobility to allow for safe discharge home. Recommend 3in1 for home use.    Follow Up Recommendations  No OT follow up;Supervision/Assistance - 24 hour    Equipment Recommendations  3 in 1 bedside comode    Recommendations for Other Services       Precautions / Restrictions Precautions Precautions: Sternal;Fall Precaution Comments: Pt able to recall 2/3 sternal precautions, reviewed all handouts during functional activities Restrictions Other Position/Activity Restrictions: Sternal precautions      Mobility Bed Mobility Overal bed mobility: Needs Assistance Bed Mobility: Rolling;Sidelying to Sit Rolling: Min guard Sidelying to sit: Min assist       General bed mobility comments: Assist for trunk support to come to sitting. Educated wife on how to assist and maintain good body mechanics to avoid injury. Educated pt/wife on log roll technique for comfort.   Transfers Overall transfer level: Needs assistance Equipment used: None Transfers: Sit to/from Stand Sit to Stand: Supervision         General transfer  comment: Supervision for safety. Pt with good demonstration of putting hands on knees to avoid pulling up to stand.     Balance Overall balance assessment: Needs assistance Sitting-balance support: No upper extremity supported;Feet supported Sitting balance-Leahy Scale: Good     Standing balance support: No upper extremity supported;During functional activity Standing balance-Leahy Scale: Good                              ADL Overall ADL's : Needs assistance/impaired Eating/Feeding: Supervision/ safety;Sitting   Grooming: Wash/dry hands;Wash/dry face;Supervision/safety;Standing   Upper Body Bathing: Supervision/ safety;Sitting   Lower Body Bathing: Minimal assistance;Sit to/from stand   Upper Body Dressing : Supervision/safety;Sitting   Lower Body Dressing: Minimal assistance;Sit to/from stand;Cueing for compensatory techniques Lower Body Dressing Details (indicate cue type and reason): educated on use of long handled sponge Toilet Transfer: Supervision/safety;Ambulation;BSC;Cueing for safety   Toileting- Clothing Manipulation and Hygiene: Supervision/safety;Sit to/from stand       Functional mobility during ADLs: Supervision/safety;Cueing for safety General ADL Comments: Pt requires cue to maintain safety and precautions at all times.     Vision Vision Assessment?: No apparent visual deficits   Perception     Praxis      Pertinent Vitals/Pain Pain Assessment: 0-10 Pain Score: 2  Pain Location: incision site Pain Descriptors / Indicators: Aching Pain Intervention(s): Monitored during session;Repositioned     Hand Dominance Right   Extremity/Trunk Assessment Upper Extremity Assessment Upper Extremity Assessment: Generalized weakness (unable to test fully d/t sternal precautions)   Lower Extremity Assessment Lower Extremity Assessment: Overall WFL for tasks assessed   Cervical / Trunk Assessment Cervical / Trunk Assessment: Normal  Communication Communication Communication: No difficulties   Cognition Arousal/Alertness: Awake/alert Behavior During Therapy: WFL for tasks assessed/performed Overall Cognitive Status: Impaired/Different from baseline Area of Impairment: Memory;Safety/judgement     Memory: Decreased short-term memory   Safety/Judgement: Decreased awareness of safety;Decreased awareness of deficits     General Comments: Pt appeared somewhat confused at times and wife answered questions for him. Pt also with decreased safety awareness and required cues to maintain safety and precautions at all times.   General Comments       Exercises       Shoulder Instructions      Home Living Family/patient expects to be discharged to:: Private residence Living Arrangements: Spouse/significant other Available Help at Discharge: Family;Available 24 hours/day Type of Home: House Home Access: Stairs to enter [employer] of Steps: 6   Home Layout: One level     Bathroom Shower/Tub: Occupational psychologist: Standard     Home Equipment: Environmental consultant - 2 wheels;Cane - single point;Shower seat - built in;Grab bars - tub/shower;Adaptive equipment Adaptive Equipment: Long-handled sponge        Prior Functioning/Environment Level of Independence: Independent        Comments: drives        OT Problem List: Decreased strength;Decreased activity tolerance;Impaired balance (sitting and/or standing);Decreased safety awareness;Decreased knowledge of use of DME or AE;Decreased knowledge of precautions;Pain;Cardiopulmonary status limiting activity;Decreased cognition   OT Treatment/Interventions: Self-care/ADL training;Therapeutic exercise;DME and/or AE instruction;Energy conservation;Therapeutic activities;Patient/family education;Balance training    OT Goals(Current goals can be found in the care plan section) Acute Rehab OT Goals Patient Stated Goal: return to yard work OT Goal  Formulation: With patient Time For Goal Achievement: 12/19/15 Potential to Achieve Goals: Good ADL Goals Pt Will Perform Tub/Shower Transfer: Shower transfer;with supervision;with caregiver independent in assisting;ambulating;shower seat Additional ADL Goal #1: Pt will demonstrate adherence to all sternal precautions with no verbal cues to increase safety with ADLs and transfers.  OT Frequency: Min 2X/week   Barriers to D/C:            Co-evaluation              End of Session Equipment Utilized During Treatment: Gait belt Nurse Communication: Mobility status  Activity Tolerance: Patient tolerated treatment well Patient left: in chair;with call bell/phone within reach;with family/visitor present   Time: [id number] OT Time Calculation (min): 20 min Charges:  OT General Charges $OT Visit: 1 Procedure OT Evaluation $OT Eval Moderate Complexity: 1 Procedure G-Codes:    Redmond Baseman, OTR/L [phone] 12/05/2015, 4:59 PM

## 2015-12-05 NOTE — Progress Notes (Signed)
Nutrition Follow Up  DOCUMENTATION CODES:   Not applicable  INTERVENTION:    Continue Magic cup TID with meals, each supplement provides 290 kcal and 9 grams of protein  NUTRITION DIAGNOSIS:   Inadequate oral intake related to poor appetite as evidenced by meal completion < 25%, per patient/family report, resolved  GOAL:   Patient will meet greater than or equal to 90% of their needs, met  MONITOR:   PO intake, Supplement acceptance, Weight trends, I & O's  ASSESSMENT:   82 YO without known CAD, but risk factors including DM II on insulin, HLD, HTN, hypothyroidism, strong family history of CAD, mild to moderate aortic stenosis, untreated OSA, and chronic RBBB/1st degree AVB/LAFB presenting with acute onset chest pain and diaphoresis to the ED, found to have NSTEMI.   Pt s/p CABG 9/20. PO intake 100% per flowsheet records. Receiving Magic Cup supplements on meal trays.  CBG (last 3)   Recent Labs  12/04/15 1533 12/04/15 2131 12/05/15 0607  GLUCAP 148* 125* 117*    Diet Order:  Diet heart healthy/carb modified Room service appropriate? Yes; Fluid consistency: Thin  Skin:  Reviewed, no issues  Last BM:  9/24  Height:   Ht Readings from Last 1 Encounters:  11/26/15 '5\' 11"'$  (1.803 m)    Weight:   Wt Readings from Last 1 Encounters:  12/05/15 185 lb 6.4 oz (84.1 kg)    Ideal Body Weight:  78.18 kg  BMI:  Body mass index is 25.86 kg/m.  Estimated Nutritional Needs:   Kcal:  2000-2200  Protein:  100-110 gm  Fluid:  2.0-2.2 L  EDUCATION NEEDS:   No education needs identified at this time  Arthur Holms, RD, LDN Pager #: [phone] After-Hours Pager #: [phone]

## 2015-12-05 NOTE — Progress Notes (Signed)
EPW removed. Pt tolerated well. Vital signs taken. Pt educated on importance of remaining on hour bed rest. Will continue to monitor.

## 2015-12-05 NOTE — Progress Notes (Signed)
CARDIAC REHAB PHASE I   PRE:  Rate/Rhythm: 62 ?a flutter vs a fib  BP:  Sitting: 171/89        SaO2: 96 RA  MODE:  Ambulation: 350 ft   POST:  Rate/Rhythm: 75 a fib  BP:  Sitting: 147/81         SaO2: 98 RA  Pt ambulated 350 ft on RA, pacer, assist x2 (pt declined use of rolling walker), fairly steady gait, tolerated well with no complaints. HR controlled. Pt a little impulsive, but moves well overall. Can be x1. Pt to recliner after walk, feet elevated, call bell within reach. Encouraged IS, additional ambulation x2 today. Will follow.   [id number] Lenna Sciara, RN, BSN 12/05/2015 9:24 AM

## 2015-12-05 NOTE — Progress Notes (Addendum)
[address]       [place] [address]             [phone]      6 Days Post-Op Procedure(s) (LRB): CORONARY ARTERY BYPASS GRAFTING (CABG), ON PUMP, TIMES FIVE, USING LEFT INTERNAL MAMMARY ARTERY, RIGHT GREATER SAPHENOUS VEIN HARVESTED ENDOSCOPICALLY (N/A) INTRAOPERATIVE TRANSESOPHAGEAL ECHOCARDIOGRAM (N/A) Subjective: Feels well  Objective: Vital signs in last 24 hours: Temp:  [97.7 F (36.5 C)-98.3 F (36.8 C)] 97.9 F (36.6 C) (09/26 [id number]) Pulse Rate:  [58-76] 76 (09/26 0608) Cardiac Rhythm: Atrial flutter (09/26 0700) Resp:  [16-26] 18 (09/26 0608) BP: (123-170)/(64-81) 170/75 (09/26 0608) SpO2:  [95 %-100 %] 95 % (09/26 0608) Weight:  [185 lb 6.4 oz (84.1 kg)] 185 lb 6.4 oz (84.1 kg) (09/26 [id number])  Hemodynamic parameters for last 24 hours:    Intake/Output from previous day: 09/25 0701 - 09/26 0700 In: 600 [P.O.:600] Out: 550 [Urine:550] Intake/Output this shift: No intake/output data recorded.  General appearance: alert, cooperative and no distress Heart: irregularly irregular rhythm Lungs: coarse BS Abdomen: benign Extremities: minor edema Wound: inci healing well  Lab Results:  Recent Labs  12/05/15 0233  WBC 8.2  HGB 10.7*  HCT 34.1*  PLT 202   BMET:  Recent Labs  12/03/15 0217 12/05/15 0233  NA 140 139  K 3.8 3.4*  CL 108 108  CO2 24 23  GLUCOSE 183* 140*  BUN 14 10  CREATININE 0.94 0.93  CALCIUM 8.1* 8.1*    PT/INR: No results for input(s): LABPROT, INR in the last 72 hours. ABG    Component Value Date/Time   PHART 7.372 11/30/2015 0946   HCO3 19.0 (L) 11/30/2015 0946   TCO2 20 11/30/2015 1645   ACIDBASEDEF 5.0 (H) 11/30/2015 0946   O2SAT 98.0 11/30/2015 0946   CBG (last 3)   Recent Labs  12/04/15 1533 12/04/15 2131 12/05/15 0607  GLUCAP 148* 125* 117*    Meds Scheduled Meds: . aspirin EC  325 mg Oral Daily  . enoxaparin (LOVENOX) injection  30 mg Subcutaneous QHS  . finasteride  5 mg Oral  Daily  . gabapentin  300 mg Oral BID  . insulin aspart  0-24 Units Subcutaneous TID AC & HS  . insulin detemir  25 Units Subcutaneous Daily  . levalbuterol  0.63 mg Nebulization BID  . levothyroxine  112 mcg Oral QAC breakfast  . mometasone-formoterol  2 puff Inhalation BID  . pantoprazole  40 mg Oral QAC breakfast  . rosuvastatin  10 mg Oral Daily  . sodium chloride flush  3 mL Intravenous Q12H  . traMADol  50 mg Oral Q6H   Continuous Infusions:  PRN Meds:.sodium chloride, bisacodyl **OR** bisacodyl, guaiFENesin, ondansetron **OR** ondansetron (ZOFRAN) IV, sodium chloride flush  Xrays No results found.  Assessment/Plan: S/P Procedure(s) (LRB): CORONARY ARTERY BYPASS GRAFTING (CABG), ON PUMP, TIMES FIVE, USING LEFT INTERNAL MAMMARY ARTERY, RIGHT GREATER SAPHENOUS VEIN HARVESTED ENDOSCOPICALLY (N/A) INTRAOPERATIVE TRANSESOPHAGEAL ECHOCARDIOGRAM (N/A)  1 good progress 2 cont rehab/pulm toilet 3 rate controlled afib - cardiol assisting with management, on ASA, they suggest eliquis 4 CBG's adeq control 5 H/H stable 6 renal fxn stable   LOS: 10 days    GOLD,WAYNE E 12/05/2015  Repeat attempt to rapid a pace out of aflutter  Pacing wire removed , to start low dose asa and elequis per cardiology I have seen and examined [name] and agree with the above assessment  and plan.  Percell Miller  Maryruth Bun MD Beeper [phone] Office [phone] 12/05/2015 3:19 PM

## 2015-12-06 LAB — GLUCOSE, CAPILLARY
GLUCOSE-CAPILLARY: 92 mg/dL (ref 65–99)
Glucose-Capillary: 117 mg/dL — ABNORMAL HIGH (ref 65–99)
Glucose-Capillary: 149 mg/dL — ABNORMAL HIGH (ref 65–99)
Glucose-Capillary: 165 mg/dL — ABNORMAL HIGH (ref 65–99)

## 2015-12-06 MED ORDER — METFORMIN HCL ER 500 MG PO TB24: 1000.0000 mg | ORAL_TABLET | Freq: Every day | ORAL | Status: DC

## 2015-12-06 MED ORDER — INSULIN GLARGINE 100 UNIT/ML ~~LOC~~ SOLN
32.0000 [IU] | Freq: Every day | SUBCUTANEOUS | Status: DC
  Filled 2015-12-06: qty 0.32

## 2015-12-06 MED ORDER — TRAMADOL HCL 50 MG PO TABS: 50.0000 mg | ORAL_TABLET | Freq: Four times a day (QID) | ORAL | 0 refills | Status: DC

## 2015-12-06 MED ORDER — GUAIFENESIN ER 600 MG PO TB12: 600.0000 mg | ORAL_TABLET | Freq: Two times a day (BID) | ORAL | Status: AC | PRN

## 2015-12-06 MED ORDER — APIXABAN 5 MG PO TABS: 5.0000 mg | ORAL_TABLET | Freq: Two times a day (BID) | ORAL | 1 refills | Status: DC

## 2015-12-06 MED ORDER — POTASSIUM CHLORIDE ER 10 MEQ PO TBCR
20.0000 meq | EXTENDED_RELEASE_TABLET | Freq: Two times a day (BID) | ORAL | Status: DC
  Administered 2015-12-06 – 2015-12-07 (×3): 20 meq via ORAL
  Filled 2015-12-06 (×6): qty 2

## 2015-12-06 MED ORDER — LISINOPRIL 10 MG PO TABS
20.0000 mg | ORAL_TABLET | Freq: Every day | ORAL | Status: DC
  Administered 2015-12-06 – 2015-12-07 (×2): 20 mg via ORAL
  Filled 2015-12-06 (×2): qty 2

## 2015-12-06 NOTE — Progress Notes (Signed)
      [address]       [place] [address]             [phone]      7 Days Post-Op Procedure(s) (LRB): CORONARY ARTERY BYPASS GRAFTING (CABG), ON PUMP, TIMES FIVE, USING LEFT INTERNAL MAMMARY ARTERY, RIGHT GREATER SAPHENOUS VEIN HARVESTED ENDOSCOPICALLY (N/A) INTRAOPERATIVE TRANSESOPHAGEAL ECHOCARDIOGRAM (N/A) Subjective: Feels good and asking to go home.   Objective: Vital signs in last 24 hours: Temp:  [98.1 F (36.7 C)-98.6 F (37 C)] 98.1 F (36.7 C) (09/27 0617) Pulse Rate:  [62-89] 68 (09/27 0617) Cardiac Rhythm: Atrial flutter (09/26 1900) Resp:  [17-18] 18 (09/27 0617) BP: (145-188)/(68-106) 188/86 (09/27 0617) SpO2:  [96 %-100 %] 97 % (09/27 0743) FiO2 (%):  [21 %] 21 % (09/27 0743) Weight:  [185 lb 1.6 oz (84 kg)] 185 lb 1.6 oz (84 kg) (09/27 0617)    Intake/Output from previous day: 09/26 0701 - 09/27 0700 In: 183.2 [I.V.:183.2] Out: 500 [Urine:500] Intake/Output this shift: No intake/output data recorded.  General appearance: alert, cooperative and no distress Heart: S1, S2 normal, irregularly irregular Lungs: rhonchi bilaterally Abdomen: soft, non-tender; bowel sounds normal; no masses,  no organomegaly Extremities: extremities normal, atraumatic, no cyanosis or edema Wound: c/d/i without drainage  Lab Results:  Recent Labs  12/05/15 0233  WBC 8.2  HGB 10.7*  HCT 34.1*  PLT 202   BMET:  Recent Labs  12/05/15 0233  NA 139  K 3.4*  CL 108  CO2 23  GLUCOSE 140*  BUN 10  CREATININE 0.93  CALCIUM 8.1*    PT/INR: No results for input(s): LABPROT, INR in the last 72 hours. ABG    Component Value Date/Time   PHART 7.372 11/30/2015 0946   HCO3 19.0 (L) 11/30/2015 0946   TCO2 20 11/30/2015 1645   ACIDBASEDEF 5.0 (H) 11/30/2015 0946   O2SAT 98.0 11/30/2015 0946   CBG (last 3)   Recent Labs  12/05/15 1616 12/05/15 2108 12/06/15 0649  GLUCAP 127* 151* 92    Assessment/Plan: S/P Procedure(s) (LRB): CORONARY  ARTERY BYPASS GRAFTING (CABG), ON PUMP, TIMES FIVE, USING LEFT INTERNAL MAMMARY ARTERY, RIGHT GREATER SAPHENOUS VEIN HARVESTED ENDOSCOPICALLY (N/A) INTRAOPERATIVE TRANSESOPHAGEAL ECHOCARDIOGRAM (N/A)  1. CV- atrial flutter rate 60s-70s. On Eliquis, ASA-Cardiology following and treating 2. Pulm-Bilateral Rhonchi on exam. CXR yesterday showed bilateral pleural effusions and lower lobe atelectasis. Encourage IS use hourly 3. Renal- creatinine stable. Hypokalemia, will replace 4. Endocrine- good blood glucose control with current regimen 5. H and H stable  Plan: Continue medical management. Continues to have episodes of atrial flutter, rate controlled. Good progress from a surgical standpoint. Anticipate discharge in 1-2 days once cleared by Cardiology.    LOS: 11 days    [place] tomorrow, on home glucose meds I have seen and examined Barry FINO Sr. and agree with the above assessment  and plan.  [name] MD Beeper [phone] Office [phone] 12/06/2015 4:41 PM   12/06/2015

## 2015-12-06 NOTE — Progress Notes (Signed)
BP uncontrolled this morning 188/86 with poor control the last few days. Will add ACEI since this patient is not a candidate for AV blocking agents. Creatinine has been stable for the last several days. We will need to see medical response before discharge.

## 2015-12-06 NOTE — Progress Notes (Signed)
Occupational Therapy Treatment/Discharge Patient Details Name: Barry HASZ Sr. MRN: [id number] DOB: 03-Sep-1932 Today's Date: 12/06/2015    History of present illness 82 yo admitted with NSTEMI s/p CABGx3 on 9/21. PMHx:CAD, DM, HLD, HTN, AS, RBBB, prostate CA, hypothyroidism   OT comments  Pt progressing well, but still demonstrates some confusion at times, decreased recall of sternal precautions and decreased safety awareness. Reviewed sternal precautions and had lengthy conversation with pt and his wife about home safety and fall prevention strategies to minimize pt's fall risk at home. After discussion, pt demonstrated improved performance of ADLs and transfers while adhering to sternal precautions. Recommend 3in1 for home use. All education has been completed and pt has no further questions. Pt with no further acute OT needs. OT signing off.   Follow Up Recommendations  No OT follow up;Supervision/Assistance - 24 hour    Equipment Recommendations  3 in 1 bedside comode    Recommendations for Other Services      Precautions / Restrictions Precautions Precautions: Sternal;Fall Precaution Comments: Pt able to recall 3/5 sternal precautions and able to recall the other 2 precautions with cues. Restrictions Other Position/Activity Restrictions: Sternal precautions       Mobility Bed Mobility Overal bed mobility: Needs Assistance Bed Mobility: Supine to Sit;Sit to Sidelying;Rolling Rolling: Supervision   Supine to sit: Min assist   Sit to sidelying: Min guard General bed mobility comments: On exiting bed, pt did not complete log roll technique and required min assist for trunk support which his wife provided. On returning to bed, pt completed log roll and did not requre physical assistance. HOB flat, no use of bedrails, exited on L side to simulate home environment  Transfers Overall transfer level: Needs assistance Equipment used: None Transfers: Sit to/from Stand Sit to  Stand: Min guard              Balance Overall balance assessment: Needs assistance Sitting-balance support: No upper extremity supported;Feet supported Sitting balance-Leahy Scale: Good     Standing balance support: No upper extremity supported;During functional activity Standing balance-Leahy Scale: Good                     ADL Overall ADL's : Needs assistance/impaired     Grooming: Wash/dry hands;Min guard;Standing                   Toilet Transfer: Ambulation;Min guard;Cueing for Administrator, sports Details (indicate cue type and reason): cues to keep hands on knees during transfer Toileting- Water quality scientist and Hygiene: Min guard;Sit to/from stand   Tub/ Shower Transfer: Walk-in shower;Min guard;Cueing for safety;Ambulation Tub/Shower Transfer Details (indicate cue type and reason): advised pt's wife on how to assist and to be present when pt showering for first week or so.   General ADL Comments: Pt somwhat unsafe at times.      Vision                     Perception     Praxis      Cognition   Behavior During Therapy: [facility] for tasks assessed/performed Overall Cognitive Status: Impaired/Different from baseline Area of Impairment: Memory;Safety/judgement     Memory: Decreased short-term memory    Safety/Judgement: Decreased awareness of safety;Decreased awareness of deficits     General Comments: Pt continues to seem confused at times and significantly decreased safety awareness.    Extremity/Trunk Assessment  Exercises     Shoulder Instructions       General Comments      Pertinent Vitals/ Pain       Pain Assessment: 0-10 Pain Score: 2  Pain Location: chest Pain Descriptors / Indicators: Sore Pain Intervention(s): Monitored during session;Repositioned  Home Living                                          Prior Functioning/Environment               Frequency           Progress Toward Goals  OT Goals(current goals can now be found in the care plan section)  Progress towards OT goals: Goals met/education completed, patient discharged from OT  Acute Rehab OT Goals Patient Stated Goal: return to yard work OT Goal Formulation: With patient Time For Goal Achievement: 12/19/15 Potential to Achieve Goals: Good ADL Goals Pt Will Perform Tub/Shower Transfer: Shower transfer;with supervision;with caregiver independent in assisting;ambulating;shower seat Additional ADL Goal #1: Pt will demonstrate adherence to all sternal precautions with no verbal cues to increase safety with ADLs and transfers.  Plan All goals met and education completed, patient discharged from OT services    Co-evaluation                 End of Session Equipment Utilized During Treatment: Gait belt   Activity Tolerance Patient tolerated treatment well   Patient Left in bed;with call bell/phone within reach;with family/visitor present   Nurse Communication Mobility status        Time: 9643-8381 OT Time Calculation (min): 16 min  Charges: OT General Charges $OT Visit: 1 Procedure OT Treatments $Self Care/Home Management : 8-22 mins  Redmond Baseman, OTR/L Pager: [phone] 12/06/2015, 9:18 AM

## 2015-12-06 NOTE — Progress Notes (Signed)
CARDIAC REHAB PHASE I   PRE:  Rate/Rhythm: 82 aflutter  BP:  Supine: 155/76  Sitting:   Standing:    SaO2: 95%RA  MODE:  Ambulation: 350 ft   POST:  Rate/Rhythm: 75 afib/flutter  BP:  Supine:   Sitting: 153/86  Standing:    SaO2: 92-94%RA 1408-1500 Pt walked 350 ft on RA with hand held asst with steady gait. C/o feeling cold so did not walk as far as he could have. Pt was sweaty so I changed his gown, pillow case and pad so he would be more comfortable and not as chilled. Pt stated he is going home so education completed with pt and wife. Encouraged IS and flutter valve. Discussed carb counting and heart healthy diets. Discussed CRP 2 and will refer to [place]. Put on discharge video for pt and wife to view.   Barry Good, RN BSN  12/06/2015 2:56 PM

## 2015-12-06 NOTE — Progress Notes (Signed)
Rate-controlled atrial flutter. On Eliquis 5 mg BID and low dose aspirin 81 mg daily. Schedule outpatient cardioversion in 3-4 weeks after consistent anticoagulation. No objections for discharge from a cardiac standpoint.  Pixie Casino, MD, [facility] Attending Cardiologist [place]

## 2015-12-06 NOTE — Consult Note (Signed)
   [facility] CM Inpatient Consult   12/06/2015  Barry AULD Sr. Jul 15, 1932 [id number]   Patient screened for potential [place] Management services. Patient is eligible for [facility] Care Management services under patient's Medicare plan. Patient was admitted with NSTEMI with S/P CABG with long length of stay 11 days but his 1st admission in the past 6 months.  Met with patient and wife regarding benefits with [facility] Care Management.  Wife states she did not think they needed care management but may consider therapy at home.  No further needs identified.  Endorse Dr. [name] as their primary care provider.  Received and accepted a brochure, 24 hour nurse advise line magnet and contact information and encouraged to call for needs.    For questions contact:   Natividad Brood, RN BSN [facility] Liaison  [phone] business mobile phone Toll free office [phone]

## 2015-12-06 NOTE — Progress Notes (Signed)
Physical Therapy Treatment Patient Details Name: Barry VINAS Sr. MRN: [id number] DOB: Nov 24, 1932 Today's Date: 12/06/2015    History of Present Illness 82 yo admitted with NSTEMI s/p CABGx3 on 9/21. PMHx:CAD, DM, HLD, HTN, AS, RBBB, prostate CA, hypothyroidism    PT Comments    Pt ambulated 400' without AD, continues to need some help with bed mobility and encouraged him to work on this before asking his wife to assist. PT will continue to follow.   Follow Up Recommendations  Home health PT;Supervision/Assistance - 24 hour     Equipment Recommendations  3in1 (PT)    Recommendations for Other Services OT consult     Precautions / Restrictions Precautions Precautions: Sternal;Fall Precaution Comments: pt able to recall precautions but not keeping them with bed mobility unless cued' Restrictions Weight Bearing Restrictions: Yes Other Position/Activity Restrictions: Sternal precautions    Mobility  Bed Mobility Overal bed mobility: Needs Assistance Bed Mobility: Supine to Sit Rolling: Modified independent (Device/Increase time) Sidelying to sit: Min assist       General bed mobility comments: pt tries to sit straight up in bed despite education from PT/OT for several days to not do this. Cued to roll over. Min A to shoulder to complete erection of trunk. Encouraged pt to try on his own as he is quick to ask his wife for help.   Transfers Overall transfer level: Needs assistance Equipment used: None Transfers: Sit to/from Stand Sit to Stand: Supervision         General transfer comment: pt stood easily from bed without having to put hands on knees  Ambulation/Gait Ambulation/Gait assistance: Supervision Ambulation Distance (Feet): 400 Feet Assistive device: None Gait Pattern/deviations: Decreased stride length;Step-through pattern Gait velocity: decreased Gait velocity interpretation: Below normal speed for age/gender General Gait Details: encouraged pt to use  cane at home when feeling fatigued, he is very resistant to RW. No LOB with ambulation, decreased pace   Stairs Stairs: Yes Stairs assistance: Min assist Stair Management: Forwards;Alternating pattern;No rails Number of Stairs: 4 General stair comments: HHA on left side, pt with safety up and down stairs  Wheelchair Mobility    Modified Rankin (Stroke Patients Only)       Balance Overall balance assessment: Needs assistance Sitting-balance support: No upper extremity supported Sitting balance-Leahy Scale: Good     Standing balance support: No upper extremity supported Standing balance-Leahy Scale: Good                      Cognition Arousal/Alertness: Awake/alert Behavior During Therapy: WFL for tasks assessed/performed Overall Cognitive Status: Impaired/Different from baseline Area of Impairment: Memory     Memory: Decreased recall of precautions;Decreased short-term memory              Exercises      General Comments General comments (skin integrity, edema, etc.): discussed activity level at d/c as well as appropriate exercises for ROM at ankle and knee      Pertinent Vitals/Pain Pain Assessment: No/denies pain Pain Score: 0-No pain         O2 sats remained in mid 90's          HR in 70's at rest, up to 90's         with ambulation  Home Living                      Prior Function            PT  Goals (current goals can now be found in the care plan section) Acute Rehab PT Goals Patient Stated Goal: return to yard work PT Goal Formulation: With patient Time For Goal Achievement: 12/15/15 Potential to Achieve Goals: Good Progress towards PT goals: Progressing toward goals    Frequency    Min 3X/week      PT Plan Current plan remains appropriate    Co-evaluation             End of Session Equipment Utilized During Treatment: Gait belt Activity Tolerance: Patient tolerated treatment well Patient left: in bed;with  call bell/phone within reach;with family/visitor present     Time: [id number] PT Time Calculation (min) (ACUTE ONLY): 30 min  Charges:  $Gait Training: 23-37 mins                    G Codes:     Leighton Roach, PT  Acute Rehab Services  [place], [place] 12/06/2015, 2:12 PM

## 2015-12-06 NOTE — Care Management Note (Signed)
Case Management Note Marvetta Gibbons RN, BSN Unit 2W-Case Manager [phone]  Patient Details  Name: Barry CLAXTON Sr. MRN: [id number] Date of Birth: [birth year]  Subjective/Objective:    Pt admitted s/p CABG                Action/Plan: PTA pt lived at home with wife- anticipate home with [facility]- orders have been placed for HHPT/OT- spoke with pt and wife at bedside- list provided for [facility] agencies in [facility]- per pt choice he would like to use [facility] for services- referral called to Santiago Glad with [facility] for HHPT/OT- per pt there are no DME needs- has a cane at home.   Expected Discharge Date:    12/07/15              Expected Discharge Plan:  [place]  In-House Referral:     Discharge planning Services  CM Consult  Post Acute Care Choice:  Home Health Choice offered to:  Patient, Spouse  DME Arranged:    DME Agency:     HH Arranged:  PT, OT HH Agency:  [place]  Status of Service:  Completed, signed off  If discussed at [place] of Stay Meetings, dates discussed:    Discharge Disposition: Home with Home Health   Additional Comments:  Dawayne Patricia, RN 12/06/2015, 3:15 PM

## 2015-12-07 LAB — GLUCOSE, CAPILLARY
GLUCOSE-CAPILLARY: 65 mg/dL (ref 65–99)
GLUCOSE-CAPILLARY: 142 mg/dL — AB (ref 65–99)
Glucose-Capillary: 171 mg/dL — ABNORMAL HIGH (ref 65–99)

## 2015-12-07 MED ORDER — ALBUTEROL SULFATE HFA 108 (90 BASE) MCG/ACT IN AERS: 2.0000 | INHALATION_SPRAY | Freq: Four times a day (QID) | RESPIRATORY_TRACT | 1 refills | Status: DC

## 2015-12-07 MED ORDER — LISINOPRIL 20 MG PO TABS: 20.0000 mg | ORAL_TABLET | Freq: Every day | ORAL | 1 refills | Status: DC

## 2015-12-07 MED ORDER — LISINOPRIL 40 MG PO TABS: 40.0000 mg | ORAL_TABLET | Freq: Every day | ORAL | 1 refills | Status: DC

## 2015-12-07 MED ORDER — ALBUTEROL SULFATE HFA 108 (90 BASE) MCG/ACT IN AERS: 2.0000 | INHALATION_SPRAY | Freq: Four times a day (QID) | RESPIRATORY_TRACT | Status: DC

## 2015-12-07 NOTE — Progress Notes (Signed)
Pts CT sutures removed per order and per protocol. Painted with tincture, steri-strips applied. Pt and wife educated not to remove steri-strips.   D/c instructions reviewed with pt and spouse. Paper scripts given. No further instructions at this time. Pt told to call when ready to leave.

## 2015-12-07 NOTE — Care Management Important Message (Signed)
Important Message  Patient Details  Name: Barry TRAINO Sr. MRN: [id number] Date of Birth: February 22, 1933   Medicare Important Message Given:  Yes    [name] 12/07/2015, 2:26 PM

## 2015-12-07 NOTE — Progress Notes (Addendum)
      [address]       [place] [address]             [phone]      8 Days Post-Op Procedure(s) (LRB): CORONARY ARTERY BYPASS GRAFTING (CABG), ON PUMP, TIMES FIVE, USING LEFT INTERNAL MAMMARY ARTERY, RIGHT GREATER SAPHENOUS VEIN HARVESTED ENDOSCOPICALLY (N/A) INTRAOPERATIVE TRANSESOPHAGEAL ECHOCARDIOGRAM (N/A) Subjective: No issues this morning. Ready to go home.  Objective: Vital signs in last 24 hours: Temp:  [97.5 F (36.4 C)-98.3 F (36.8 C)] 98.1 F (36.7 C) (09/28 0511) Pulse Rate:  [63-69] 68 (09/28 0511) Cardiac Rhythm: Atrial flutter;Bundle branch block (09/27 1903) Resp:  [18] 18 (09/28 0511) BP: (140-156)/(66-89) 143/68 (09/28 0511) SpO2:  [96 %-98 %] 96 % (09/28 0511) FiO2 (%):  [21 %] 21 % (09/27 0743) Weight:  [182 lb 1.6 oz (82.6 kg)] 182 lb 1.6 oz (82.6 kg) (09/28 0511)     Intake/Output from previous day: 09/27 0701 - 09/28 0700 In: 240 [P.O.:240] Out: 950 [Urine:950] Intake/Output this shift: No intake/output data recorded.  General appearance: alert, cooperative and no distress Heart: irregularly irregular rhythm and S1, S2 normal Lungs: rhonchi bilaterally Abdomen: soft, non-tender; bowel sounds normal; no masses,  no organomegaly Extremities: extremities normal, atraumatic, no cyanosis or edema Wound: c/d/i without drainage  Lab Results:  Recent Labs  12/05/15 0233  WBC 8.2  HGB 10.7*  HCT 34.1*  PLT 202   BMET:  Recent Labs  12/05/15 0233  NA 139  K 3.4*  CL 108  CO2 23  GLUCOSE 140*  BUN 10  CREATININE 0.93  CALCIUM 8.1*    PT/INR: No results for input(s): LABPROT, INR in the last 72 hours. ABG    Component Value Date/Time   PHART 7.372 11/30/2015 0946   HCO3 19.0 (L) 11/30/2015 0946   TCO2 20 11/30/2015 1645   ACIDBASEDEF 5.0 (H) 11/30/2015 0946   O2SAT 98.0 11/30/2015 0946   CBG (last 3)   Recent Labs  12/06/15 2127 12/07/15 0622 12/07/15 0648  GLUCAP 165* 65 142*     Assessment/Plan: S/P Procedure(s) (LRB): CORONARY ARTERY BYPASS GRAFTING (CABG), ON PUMP, TIMES FIVE, USING LEFT INTERNAL MAMMARY ARTERY, RIGHT GREATER SAPHENOUS VEIN HARVESTED ENDOSCOPICALLY (N/A) INTRAOPERATIVE TRANSESOPHAGEAL ECHOCARDIOGRAM (N/A)   1. CV- atrial flutter rate 60s-70s. On Eliquis, ASA. BP better controlled on Lisinopril.  2. Pulm-Bilateral Rhonchi on exam. CXR yesterday showed bilateral pleural effusions and lower lobe atelectasis. Encourage IS use hourly. Tolerating RA 3. Renal- creatinine stable. No labs today 4. Endocrine- good blood glucose control with current regimen 5. H and H stable    LOS: 12 days   Plan: d/c today home with PT/OT. Will need to f/u with cardiology in 2 weeks and CT surgery in 4 weeks.   Elgie Collard 12/07/2015  plan d/c today  Home pt/ot Cardioversion in 3-4 weeks per cardiology I have seen and examined Barry Officer Sr. and agree with the above assessment  and plan.  [name] MD Beeper [phone] Office [phone] 12/07/2015 8:51 AM

## 2015-12-07 NOTE — Progress Notes (Signed)
Insurance check completed for Eliquis S/[place] # [phone]   ELIQUIS  5 MG BID ( 30 ) 60 TAB  COVER- YES  CO-PAY- 40 % CO-INS PATIENT IN COVERAGE-GAP  TIER- 3 DRUG  PRIOR APPROVAL - NO  PHARMACY : ANY RETAIL

## 2015-12-07 NOTE — Care Management Note (Signed)
Case Management Note Barry Gibbons RN, BSN Unit 2W-Case Manager [phone]  Patient Details  Name: Barry KEADLE Sr. MRN: [id number] Date of Birth: 11/12/1932  Subjective/Objective:    Pt admitted s/p CABG                Action/Plan: PTA pt lived at home with wife- anticipate home with [facility]- orders have been placed for HHPT/OT- spoke with pt and wife at bedside- list provided for [facility] agencies in [facility]- per pt choice he would like to use [facility] for services- referral called to Santiago Glad with [facility] for HHPT/OT- per pt there are no DME needs- has a cane at home.   Expected Discharge Date:    12/07/15              Expected Discharge Plan:  [place]  In-House Referral:     Discharge planning Services  CM Consult, Medication Assistance  Post Acute Care Choice:  Home Health Choice offered to:  Patient, Spouse  DME Arranged:    DME Agency:     HH Arranged:  PT, OT HH Agency:  [place]  Status of Service:  Completed, signed off  If discussed at [place] of Stay Meetings, dates discussed:    Discharge Disposition: Home with Home Health   Additional Comments:  12/07/15- 1000- Barry Gibbons RN, CM- pt for d/c home today- [employer] arrangements have been made- informed by pharmacy that pt is a new Eliquis pt- benefits check submitted- spoke with pt and wife at bedside- 30 day free card given to wife to use on discharge- believe pt may be in donut hole- pt assist application also given to wife- with toll free # to call to see if pt met for assistance with drug company while he is in donut hole. Barry Mcdaniel, Barry Rabon, RN 12/07/2015, 10:11 AM

## 2015-12-07 NOTE — Progress Notes (Signed)
Hypoglycemic Event  CBG: 65  Treatment: 15 GM carbohydrate snack  Symptoms: None  Follow-up CBG: [id number] CBG Result:142  Possible Reasons for Event: Inadequate meal intake     Brendi Mccarroll M

## 2015-12-07 NOTE — Discharge Instructions (Signed)
Coronary Artery Bypass Grafting, Care After °Refer to this sheet in the next few weeks. These instructions provide you with information on caring for yourself after your procedure. Your health care provider may also give you more specific instructions. Your treatment has been planned according to current medical practices, but problems sometimes occur. Call your health care provider if you have any problems or questions after your procedure. °WHAT TO EXPECT AFTER THE PROCEDURE °Recovery from surgery will be different for everyone. Some people feel well after 3 or 4 weeks, while for others it takes longer. After your procedure, it is typical to have the following: °· Nausea and a lack of appetite.   °· Constipation. °· Weakness and fatigue.   °· Depression or irritability.   °· Pain or discomfort at your incision site. °HOME CARE INSTRUCTIONS °· Take medicines only as directed by your health care provider. Do not stop taking medicines or start any new medicines without first checking with your health care provider. °· Take your pulse as directed by your health care provider. °· Perform deep breathing as directed by your health care provider. If you were given a device called an incentive spirometer, use it to practice deep breathing several times a day. Support your chest with a pillow or your arms when you take deep breaths or cough. °· Keep incision areas clean, dry, and protected. Remove or change any bandages (dressings) only as directed by your health care provider. You may have skin adhesive strips over the incision areas. Do not take the strips off. They will fall off on their own. °· Check incision areas daily for any swelling, redness, or drainage. °· If incisions were made in your legs, do the following: °¨ Avoid crossing your legs.   °¨ Avoid sitting for long periods of time. Change positions every 30 minutes.   °¨ Elevate your legs when you are sitting. °· Wear compression stockings as directed by your  health care provider. These stockings help keep blood clots from forming in your legs. °· Take showers once your health care provider approves. Until then, only take sponge baths. Pat incisions dry. Do not rub incisions with a washcloth or towel. Do not take baths, swim, or use a hot tub until your health care provider approves. °· Eat foods that are high in fiber, such as raw fruits and vegetables, whole grains, beans, and nuts. Meats should be lean cut. Avoid canned, processed, and fried foods. °· Drink enough fluid to keep your urine clear or pale yellow. °· Weigh yourself every day. This helps identify if you are retaining fluid that may make your heart and lungs work harder. °· Rest and limit activity as directed by your health care provider. You may be instructed to: °¨ Stop any activity at once if you have chest pain, shortness of breath, irregular heartbeats, or dizziness. Get help right away if you have any of these symptoms. °¨ Move around frequently for short periods or take short walks as directed by your health care provider. Increase your activities gradually. You may need physical therapy or cardiac rehabilitation to help strengthen your muscles and build your endurance. °¨ Avoid lifting, pushing, or pulling anything heavier than 10 lb (4.5 kg) for at least 6 weeks after surgery. °· Do not drive until your health care provider approves.  °· Ask your health care provider when you may return to work. °· Ask your health care provider when you may resume sexual activity. °· Keep all follow-up visits as directed by your health care   care provider. This is important. SEEK MEDICAL CARE IF:  You have swelling, redness, increasing pain, or drainage at the site of an incision.  You have a fever.  You have swelling in your ankles or legs.  You have pain in your legs.   You gain 2 or more pounds (0.9 kg) a day.  You are nauseous or vomit.  You have diarrhea. SEEK IMMEDIATE MEDICAL CARE IF:  You have  chest pain that goes to your jaw or arms.  You have shortness of breath.   You have a fast or irregular heartbeat.   You notice a "clicking" in your breastbone (sternum) when you move.   You have numbness or weakness in your arms or legs.  You feel dizzy or light-headed.  MAKE SURE YOU:  Understand these instructions.  Will watch your condition.  Will get help right away if you are not doing well or get worse.   This information is not intended to replace advice given to you by your health care provider. Make sure you discuss any questions you have with your health care provider.   Document Released: 09/14/2004 Document Revised: 03/18/2014 Document Reviewed: 08/04/2012 Elsevier Interactive Patient Education 2016 [place] on my medicine - ELIQUIS (apixaban)  This medication education was reviewed with me or my healthcare representative as part of my discharge preparation.  The pharmacist that spoke with me during my hospital stay was:  Tad Moore, [facility]  Why was Eliquis prescribed for you? Eliquis was prescribed for you to reduce the risk of a blood clot forming that can cause a stroke if you have a medical condition called atrial fibrillation (a type of irregular heartbeat).  What do You need to know about Eliquis ? Take your Eliquis TWICE DAILY - one tablet in the morning and one tablet in the evening with or without food. If you have difficulty swallowing the tablet whole please discuss with your pharmacist how to take the medication safely.  Take Eliquis exactly as prescribed by your doctor and DO NOT stop taking Eliquis without talking to the doctor who prescribed the medication.  Stopping may increase your risk of developing a stroke.  Refill your prescription before you run out.  After discharge, you should have regular check-up appointments with your healthcare provider that is prescribing your Eliquis.  In the future your dose may need to  be changed if your kidney function or weight changes by a significant amount or as you get older.  What do you do if you miss a dose? If you miss a dose, take it as soon as you remember on the same day and resume taking twice daily.  Do not take more than one dose of ELIQUIS at the same time to make up a missed dose.  Important Safety Information A possible side effect of Eliquis is bleeding. You should call your healthcare provider right away if you experience any of the following: ? Bleeding from an injury or your nose that does not stop. ? Unusual colored urine (red or dark brown) or unusual colored stools (red or black). ? Unusual bruising for unknown reasons. ? A serious fall or if you hit your head (even if there is no bleeding).  Some medicines may interact with Eliquis and might increase your risk of bleeding or clotting while on Eliquis. To help avoid this, consult your healthcare provider or pharmacist prior to using any new prescription or non-prescription medications, including herbals, vitamins, non-steroidal anti-inflammatory drugs (  NSAIDs) and supplements.  This website has more information on Eliquis (apixaban): http://www.eliquis.com/eliquis/home

## 2015-12-08 DIAGNOSIS — E1122 Type 2 diabetes mellitus with diabetic chronic kidney disease: Secondary | ICD-10-CM | POA: Diagnosis not present

## 2015-12-08 DIAGNOSIS — Z8546 Personal history of malignant neoplasm of prostate: Secondary | ICD-10-CM | POA: Diagnosis not present

## 2015-12-08 DIAGNOSIS — M150 Primary generalized (osteo)arthritis: Secondary | ICD-10-CM | POA: Diagnosis not present

## 2015-12-08 DIAGNOSIS — E039 Hypothyroidism, unspecified: Secondary | ICD-10-CM | POA: Diagnosis not present

## 2015-12-08 DIAGNOSIS — I214 Non-ST elevation (NSTEMI) myocardial infarction: Secondary | ICD-10-CM | POA: Diagnosis not present

## 2015-12-08 DIAGNOSIS — E7800 Pure hypercholesterolemia, unspecified: Secondary | ICD-10-CM | POA: Diagnosis not present

## 2015-12-08 DIAGNOSIS — K219 Gastro-esophageal reflux disease without esophagitis: Secondary | ICD-10-CM | POA: Diagnosis not present

## 2015-12-08 DIAGNOSIS — I252 Old myocardial infarction: Secondary | ICD-10-CM | POA: Diagnosis not present

## 2015-12-08 DIAGNOSIS — G4733 Obstructive sleep apnea (adult) (pediatric): Secondary | ICD-10-CM | POA: Diagnosis not present

## 2015-12-08 DIAGNOSIS — N183 Chronic kidney disease, stage 3 (moderate): Secondary | ICD-10-CM | POA: Diagnosis not present

## 2015-12-08 DIAGNOSIS — Z794 Long term (current) use of insulin: Secondary | ICD-10-CM | POA: Diagnosis not present

## 2015-12-08 DIAGNOSIS — Z7982 Long term (current) use of aspirin: Secondary | ICD-10-CM | POA: Diagnosis not present

## 2015-12-08 DIAGNOSIS — D509 Iron deficiency anemia, unspecified: Secondary | ICD-10-CM | POA: Diagnosis not present

## 2015-12-08 DIAGNOSIS — Z951 Presence of aortocoronary bypass graft: Secondary | ICD-10-CM | POA: Diagnosis not present

## 2015-12-08 DIAGNOSIS — I129 Hypertensive chronic kidney disease with stage 1 through stage 4 chronic kidney disease, or unspecified chronic kidney disease: Secondary | ICD-10-CM | POA: Diagnosis not present

## 2015-12-08 DIAGNOSIS — I2510 Atherosclerotic heart disease of native coronary artery without angina pectoris: Secondary | ICD-10-CM | POA: Diagnosis not present

## 2015-12-08 DIAGNOSIS — F1011 Alcohol abuse, in remission: Secondary | ICD-10-CM | POA: Diagnosis not present

## 2015-12-08 DIAGNOSIS — Z48812 Encounter for surgical aftercare following surgery on the circulatory system: Secondary | ICD-10-CM | POA: Diagnosis not present

## 2015-12-08 DIAGNOSIS — J45909 Unspecified asthma, uncomplicated: Secondary | ICD-10-CM | POA: Diagnosis not present

## 2015-12-08 DIAGNOSIS — E1151 Type 2 diabetes mellitus with diabetic peripheral angiopathy without gangrene: Secondary | ICD-10-CM | POA: Diagnosis not present

## 2015-12-08 NOTE — Telephone Encounter (Signed)
Patient stated she is returning your call.  

## 2015-12-11 DIAGNOSIS — N183 Chronic kidney disease, stage 3 (moderate): Secondary | ICD-10-CM | POA: Diagnosis not present

## 2015-12-11 DIAGNOSIS — Z48812 Encounter for surgical aftercare following surgery on the circulatory system: Secondary | ICD-10-CM | POA: Diagnosis not present

## 2015-12-11 DIAGNOSIS — E1122 Type 2 diabetes mellitus with diabetic chronic kidney disease: Secondary | ICD-10-CM | POA: Diagnosis not present

## 2015-12-11 DIAGNOSIS — I214 Non-ST elevation (NSTEMI) myocardial infarction: Secondary | ICD-10-CM | POA: Diagnosis not present

## 2015-12-11 DIAGNOSIS — I129 Hypertensive chronic kidney disease with stage 1 through stage 4 chronic kidney disease, or unspecified chronic kidney disease: Secondary | ICD-10-CM | POA: Diagnosis not present

## 2015-12-11 DIAGNOSIS — I2510 Atherosclerotic heart disease of native coronary artery without angina pectoris: Secondary | ICD-10-CM | POA: Diagnosis not present

## 2015-12-12 ENCOUNTER — Telehealth: Payer: Self-pay

## 2015-12-12 ENCOUNTER — Other Ambulatory Visit (INDEPENDENT_AMBULATORY_CARE_PROVIDER_SITE_OTHER): Payer: Medicare Other

## 2015-12-12 DIAGNOSIS — E538 Deficiency of other specified B group vitamins: Secondary | ICD-10-CM

## 2015-12-12 DIAGNOSIS — Z794 Long term (current) use of insulin: Secondary | ICD-10-CM | POA: Diagnosis not present

## 2015-12-12 DIAGNOSIS — E039 Hypothyroidism, unspecified: Secondary | ICD-10-CM

## 2015-12-12 DIAGNOSIS — E1165 Type 2 diabetes mellitus with hyperglycemia: Secondary | ICD-10-CM | POA: Diagnosis not present

## 2015-12-12 LAB — COMPREHENSIVE METABOLIC PANEL WITH GFR
ALT: 10 U/L (ref 0–53)
AST: 15 U/L (ref 0–37)
Albumin: 2.9 g/dL — ABNORMAL LOW (ref 3.5–5.2)
Alkaline Phosphatase: 77 U/L (ref 39–117)
BUN: 9 mg/dL (ref 6–23)
CO2: 26 meq/L (ref 19–32)
Calcium: 8.6 mg/dL (ref 8.4–10.5)
Chloride: 109 meq/L (ref 96–112)
Creatinine, Ser: 1.14 mg/dL (ref 0.40–1.50)
GFR: 65.56 mL/min
Glucose, Bld: 63 mg/dL — ABNORMAL LOW (ref 70–99)
Potassium: 4.7 meq/L (ref 3.5–5.1)
Sodium: 142 meq/L (ref 135–145)
Total Bilirubin: 0.4 mg/dL (ref 0.2–1.2)
Total Protein: 6.6 g/dL (ref 6.0–8.3)

## 2015-12-12 LAB — TSH: TSH: 7.31 u[IU]/mL — ABNORMAL HIGH (ref 0.35–4.50)

## 2015-12-12 LAB — T4, FREE: FREE T4: 0.79 ng/dL (ref 0.60–1.60)

## 2015-12-12 LAB — VITAMIN B12: VITAMIN B 12: 616 pg/mL (ref 211–911)

## 2015-12-12 NOTE — Telephone Encounter (Signed)
Barry Mcdaniel OT with [facility] called to inform Dr Servando Snare that the OT with patient will begin next week per family and patients request.

## 2015-12-13 LAB — FRUCTOSAMINE: FRUCTOSAMINE: 191 umol/L (ref 0–285)

## 2015-12-13 NOTE — Progress Notes (Signed)
Cardiology Office Note    Date:  12/14/2015   ID:  Barry Officer Sr., DOB 05/23/1932, MRN [id number]  PCP:  Barry Snare, MD  Cardiologist:  Dr. Johnsie Mcdaniel  CC: post hospital follow up  History of Present Illness:  Barry Mcdaniel Sr. is a 82 y.o. male with a history of HLD, DMT2, HTN, OSA, PVD, trifascicular block and recently diagnosed CAD s/p CABG x5V with post op atrial flutter who presents to clinic for post hospital follow up.   Recently admitted from 9/16-9/28/17. He presented with chest pain and ruled in for NSTEMI (pk trop 1.62). Cath showed critical 3 vessel and left main CAD and he was referred for CABG. Echo results showed no AI, mild AS, mild MR, and no MS, and LVEF 45-50% He underwent a CABG x 5 on 11/29/2015. Post op course was c/b volume overload requiring diuresis and atrial flutter. Given recent AV dissociation and history of "trifascicular block," amiodarone and other AV nodal blocking agents were avoided. He was rate-controlled due to intrinsic AV block. Cardiology recommend anticoagulation with Eliquis 5mg  BID and outpatient cardioversion attempt if he does not spontaneously convert in 3 weeks.   Today he presents to clinic for follow up. He says that he had chest pain since leaving the hospital but it "miraculsouly went away a couple days ago after praying." He has been feeling achy today. Also getting a HA after taking is AM and PM pills. No SOB. No LE edema, orthopnea or PND. No dizziness or syncope. Still with chronic cough. Does not feel a flutter.     Past Medical History:  Diagnosis Date  . Anemia    hx of year ago no problems now per pt   . Arthritis    generalized   . Asthma   . Atypical chest pain   . Bladder neck contracture   . Chronic back pain   . Complication of anesthesia    POST OP HYPOTENSION  . Dementia    per wife pt had neurological evaluation  . Diverticulosis of colon   . First degree heart block   . GERD (gastroesophageal reflux disease)   .  Heart murmur   . History of basal cell carcinoma excision    forehead and x6 area forearms  . History of Bell's palsy    x2  1998 &  2004 left side --  residual slight left eye droop and vace  . History of chronic bronchitis   . History of colon polyps   . History of gout   . History of hypertension   . History of MI (myocardial infarction)    08/ 2010  secondary acute renal failure/ dehydration--  medical management  . History of pulmonary embolus (PE)    2010  . Hypogonadism male   . Hypotension   . Hypothyroidism   . OSA (obstructive sleep apnea)    severe per study 2007/  non-compliant cpap  . Peripheral vascular disease (Barry Mcdaniel)   . Prostate carcinoma [facility]) urologist-  dr Gaynelle Arabian   DX 2007  Gleason 6  and recurrent 2012  s/p  cryoablation of prostate both times  . RBBB (right bundle branch block)   . Recovering alcoholic in remission ([place])   . Renal calculus, bilateral   . Type 2 diabetes mellitus ([place])     Past Surgical History:  Procedure Laterality Date  . CARDIAC CATHETERIZATION N/A 11/27/2015   Procedure: Left Heart Cath and Coronary Angiography;  Surgeon: Barry M [name], MD;  Location: [place] [facility];  Service: Cardiovascular;  Laterality: N/A;  . CARDIOVASCULAR STRESS TEST  05-19-2012  dr Barry Mcdaniel   normal perfusionstudy/  no ischemia/  attenuation artifact inferoapex region of myocardium/  note gate secondary to rhythm irregularity  . CATARACT EXTRACTION W/ INTRAOCULAR LENS IMPLANT Right 2014  . COLONOSCOPY W/ POLYPECTOMY  last one 08-10-2013  . CORONARY ARTERY BYPASS GRAFT N/A 11/29/2015   Procedure: CORONARY ARTERY BYPASS GRAFTING (CABG), ON PUMP, TIMES FIVE, USING LEFT INTERNAL MAMMARY ARTERY, RIGHT GREATER SAPHENOUS VEIN HARVESTED ENDOSCOPICALLY;  Surgeon: Barry Isaac, MD;  Location: [place];  Service: Open Heart Surgery;  Laterality: N/A;  LIMA-LAD; SEQ SVG-OM1-OM2;SVG-DIAG; SVG-PL  . CYSTOSCOPY WITH URETHRAL DILATATION N/A 01/14/2014   Procedure:  URETHRAL MEATAL DILATATION;  Surgeon: Ailene Rud, MD;  Location: [facility];  Service: Urology;  Laterality: N/A;  . EXCISION BENIGN SKIN LESION OF BACK  10/ 2011  . HIATAL HERNIA REPAIR  1983  . INGUINAL HERNIA REPAIR Right 10-14-2001  . INTRAOPERATIVE TRANSESOPHAGEAL ECHOCARDIOGRAM N/A 11/29/2015   Procedure: INTRAOPERATIVE TRANSESOPHAGEAL ECHOCARDIOGRAM;  Surgeon: Barry Isaac, MD;  Location: [place];  Service: Open Heart Surgery;  Laterality: N/A;  . KNEE ARTHROSCOPY W/ MENISCECTOMY Right 01-25-2003   and chondroplasty  . [place]  . PROSTATE CRYOABLATION  09-17-2010  &  04-12-2005  . REPAIR RIGHT ARM TENDON INJURY  1975  . SHOULDER OPEN ROTATOR CUFF REPAIR  03/20/2011   Procedure: ROTATOR CUFF REPAIR SHOULDER OPEN;  Surgeon: Tobi Bastos;  Location: [facility];  Service: Orthopedics;  Laterality: Right;  . TONSILLECTOMY  as child  . TRANSTHORACIC ECHOCARDIOGRAM  01-11-2013  dr Barry Mcdaniel   moderate LVH/  grade I diastolic dysfunction/ ef [id number]  mild AV stenosis /  mild MR/  mild LAE  . TRANSURETHRAL RESECTION OF BLADDER TUMOR N/A 01/14/2014   Procedure: TRANSURETHRAL INCISION OF BLADDER NECK CONTRACTURE;  Surgeon: Ailene Rud, MD;  Location: [facility];  Service: Urology;  Laterality: N/A;  . Vantus Implant     Hormones for Prostate  . VENTRAL HERNIA REPAIR  1998    Current Medications: Outpatient Medications Prior to Visit  Medication Sig Dispense Refill  . acetaminophen (TYLENOL) 500 MG tablet Take 500 mg by mouth every 8 (eight) hours as needed for mild pain, moderate pain or headache.     . albuterol (PROVENTIL HFA;VENTOLIN HFA) 108 (90 Base) MCG/ACT inhaler Inhale 2 puffs into the lungs every 6 (six) hours. 1 Inhaler 1  . apixaban (ELIQUIS) 5 MG TABS tablet Take 1 tablet (5 mg total) by mouth 2 (two) times daily. 60 tablet 1  . aspirin EC 81 MG tablet Take 81 mg by mouth daily.     . budesonide-formoterol  (SYMBICORT) 160-4.5 MCG/ACT inhaler Inhale 2 puffs into the lungs 2 (two) times daily. 3 Inhaler 3  . CRESTOR 10 MG tablet TAKE ONE TABLET BY MOUTH ONCE DAILY 90 tablet 1  . finasteride (PROSCAR) 5 MG tablet Take 5 mg by mouth every morning.     . gabapentin (NEURONTIN) 300 MG capsule TAKE ONE CAPSULE BY MOUTH 4 TIMES DAILY (Patient taking differently: Take 300 mg by mouth in the morning and 300 mg in the evening (may also takes 2 more times daily as needed for nerve pain)) 120 capsule 3  . glucose blood (ONE TOUCH ULTRA TEST) test strip Use as instructed to check blood sugar 3 times per day dx code E11.65  100 each 5  . HUMALOG MIX 75/25 KWIKPEN (75-25) 100 UNIT/ML Kwikpen INJECT 20 UNITS SUBCUTANEOUSLY IN THE MORNING AND 30 UNITS SUBCUTANEOUSLY IN THE EVENING DAILY (Patient taking differently: Inject 20 units into the skin in the morning then 14 units in the afternoon then 16 units at bedtime) 45 mL 0  . levothyroxine (SYNTHROID, LEVOTHROID) 112 MCG tablet TAKE ONE TABLET BY MOUTH ONCE DAILY (Patient taking differently: Take 112 mcg by mouth once a day) 90 tablet 0  . lisinopril (PRINIVIL,ZESTRIL) 40 MG tablet Take 1 tablet (40 mg total) by mouth daily. 30 tablet 1  . menthol-thymol (ABSORBINE JR) LIQD Apply 1 application topically as needed. For pain.    . metFORMIN (GLUCOPHAGE-XR) 500 MG 24 hr tablet TAKE THREE TABLETS BY MOUTH ONCE DAILY WITH SUPPER (Patient taking differently: Take 1,000 mg by mouth in the evening) 90 tablet 5  . Polyethyl Glycol-Propyl Glycol (SYSTANE) 0.4-0.3 % SOLN Apply 1-2 drops to eye 3 (three) times daily as needed (for dry eyes).    . polyethylene glycol (MIRALAX / GLYCOLAX) packet Take 17 g by mouth daily as needed for mild constipation, moderate constipation or severe constipation. 1 capful daily as needed    . QUEtiapine (SEROQUEL) 25 MG tablet Take 1 tablet (25 mg total) by mouth at bedtime. 30 tablet 1  . TOUJEO SOLOSTAR 300 UNIT/ML SOPN INJECT 42 UNITS SUBCUTANEOUSLY  EVERY MORNING (Patient taking differently: Inject 32 units into the skin in the morning) 6 mL 3  . traMADol (ULTRAM) 50 MG tablet Take 1 tablet (50 mg total) by mouth every 6 (six) hours. 30 tablet 0  . triamcinolone (NASACORT AQ) 55 MCG/ACT AERO nasal inhaler Place 1-2 sprays into the nose daily.     . vitamin B-12 (CYANOCOBALAMIN) 1000 MCG tablet Take 1,000 mcg by mouth daily.     No facility-administered medications prior to visit.      Allergies:   Penicillins and Prednisone   Social History   Social History  . Marital status: Married    Spouse name: N/A  . Number of children: N/A  . Years of education: N/A   Occupational History  . Retired      Personal assistant    Social History Main Topics  . Smoking status: Never Smoker  . Smokeless tobacco: Never Used  . Alcohol use No     Comment: RECOVERING ALCOHOLIC none since [id number]   . Drug use: No  . Sexual activity: Not Currently   Other Topics Concern  . None   Social History Narrative  . None     Family History:  The patient's family history includes Cancer in his brother; Heart disease in his mother; Stroke in his father.     ROS:   Please see the history of present illness.    ROS All other systems reviewed and are negative.   PHYSICAL EXAM:   VS:  BP (!) 154/76   Pulse 67   Ht 5\' 11"  (1.803 m)   Wt 186 lb 12.8 oz (84.7 kg)   BMI 26.05 kg/m    GEN: Well nourished, well developed, in no acute distress  HEENT: normal  Neck: no JVD, carotid bruits, or masses Cardiac: irreg irreg; no murmurs, rubs, or gallops,no edema  Respiratory:  rhoncorous on auscultation bilaterally, normal work of breathing GI: soft, nontender, nondistended, + BS MS: no deformity or atrophy  Skin: warm and dry, no rash Neuro:  Alert and Oriented x 3, Strength and sensation are intact Psych: euthymic  mood, full affect  Wt Readings from Last 3 Encounters:  12/14/15 186 lb 12.8 oz (84.7 kg)  12/07/15 182 lb 1.6 oz (82.6 kg)  11/03/15 189 lb  (85.7 kg)      Studies/Labs Reviewed:   EKG:  EKG is ordered today.  The ekg ordered today demonstrates atrial flutter with var block LAD, RBBB  Recent Labs: 11/25/2015: B Natriuretic Peptide 1,139.9 11/30/2015: Magnesium 2.2 12/05/2015: Hemoglobin 10.7; Platelets 202 12/12/2015: ALT 10; BUN 9; Creatinine, Ser 1.14; Potassium 4.7; Sodium 142; TSH 7.31   Lipid Panel    Component Value Date/Time   CHOL 117 10/31/2015 1340   TRIG 98.0 10/31/2015 1340   HDL 44.60 10/31/2015 1340   CHOLHDL 3 10/31/2015 1340   VLDL 19.6 10/31/2015 1340   LDLCALC 53 10/31/2015 1340    Additional studies/ records that were reviewed today include:  Left Heart Cath and Coronary Angiography by Dr. [name] on 11/27/2015:  Conclusion     Ost LM to LM lesion, 80 %stenosed.  Prox LAD lesion, 80 %stenosed.  Mid LAD lesion, 90 %stenosed.  Dist LAD lesion, 95 %stenosed.  Ost 1st Diag to 1st Diag lesion, 100 %stenosed.  Ost Ramus lesion, 90 %stenosed.  Ost Cx to Prox Cx lesion, 90 %stenosed.  Ost 1st Mrg to 1st Mrg lesion, 80 %stenosed.  Prox Cx to Mid Cx lesion, 100 %stenosed.  Prox RCA lesion, 95 %stenosed.  Dist RCA lesion, 90 %stenosed.  1. Critical 3 vessel and left main CAD 2. Mildly elevated LV EDP 3. No significant AV gradient by pullback.    Coronary artery bypass grafting x5 with left internal mammary to the left anterior descending coronary artery reverse saphenous vein graft to the diagonal coronary artery, sequential reverse saphenous vein graft to the first and second obtuse marginal reverse saphenous vein graft to the posterior lateral branch of the right coronary artery with right thigh and calf greater saphenous vein harvesting endoscopically by Dr. Servando Mcdaniel on 11/29/2015.   2D ECHO: 11/28/2015 LV EF: 45% -   50% Study Conclusions - Left ventricle: The cavity size was normal. There was severe   concentric hypertrophy. Systolic function was mildly reduced. The    estimated ejection fraction was in the range of 45% to 50%.   Hypokinesis of the apical, mid-apical anteroseptal and   inferoseptal myocardium, and apical inferolateral, and inferior   myocardium. Doppler parameters are consistent with a reversible   restrictive pattern, indicative of decreased left ventricular   diastolic compliance and/or increased left atrial pressure (grade   3 diastolic dysfunction). - Aortic valve: Trileaflet; moderately thickened, moderately   calcified leaflets. Valve mobility was restricted. There was mild   stenosis. There was no regurgitation. Valve area (VTI): 0.88   cm^2. Valve area (Vmax): 0.96 cm^2. Valve area (Vmean): 0.86   cm^2. - Mitral valve: Calcified annulus. Transvalvular velocity was   within the normal range. There was no evidence for stenosis.   There was mild regurgitation. - Left atrium: The atrium was severely dilated. - Right ventricle: The cavity size was normal. Wall thickness was   normal. Systolic function was normal. - Atrial septum: A patent foramen ovale cannot be excluded. - Tricuspid valve: There was mild regurgitation. Impressions: - Aortic valve appears heavily calcified and thickend. There   appears to be mild aortic stenosis by peak velocity and mean   gradients. However, given reduced systolic function, cannot rule   out more severe valvular heart disease or &quot;low flow, low   gradient&quot; aortic  stenosis.   ASSESSMENT & PLAN:   CAD: LM 3VD seen by s/p CABG x5V. Continue ASA and statin. Had some chest pain after discharge but now resolved. Will give SL NTG  Post op atrial flutter: on no AV nodal blocking agents. Continue Eliquis 5mg  BID for CHADSVASC of 6 (CHF, HTN, age, DM, CAD). Still in atrial flutter. Will plan for DCCV after 3 weeks of uninterrupted Eliquis ~11/28/15.  Bifascicular block, Mobitz type I: significant conduction abnormality seen on EKG, avoiding Beta blockers. No syncope.  Chronic kidney disease  stage III: creat stable at 1.14  Hyperlipidemia: LDL 53. Continue Crestor 10mg  daily.   Diabetes with nephropathy: continue current regimen and follow up with Dr. Dwyane Dee  HTN: BP well controlled on current regimen   Medication Adjustments/Labs and Tests Ordered: Current medicines are reviewed at length with the patient today.  Concerns regarding medicines are outlined above.  Medication changes, Labs and Tests ordered today are listed in the Patient Instructions below. Patient Instructions  Medication Instructions:  Your physician has recommended you make the following change in your medication:  1  START Nitroglycerin 0.4 s/l USE AS DIRECTED ON THE BOTTLE  Labwork: None ordered  Testing/Procedures: None ordered  Follow-Up: Your physician recommends that you schedule a follow-up appointment in: 12/27/15 WITH KATIE Yates Weisgerber, PA-C   Any Other Special Instructions Will Be Listed Below (If Applicable).    If you need a refill on your cardiac medications before your next appointment, please call your pharmacy.      Signed, Angelena Form, PA-C  12/14/2015 10:51 AM    [facility] [place], [place],   [address] Phone: [phone]; Fax: [phone]

## 2015-12-14 ENCOUNTER — Ambulatory Visit (INDEPENDENT_AMBULATORY_CARE_PROVIDER_SITE_OTHER): Payer: Medicare Other | Admitting: Physician Assistant

## 2015-12-14 ENCOUNTER — Encounter: Payer: Self-pay | Admitting: Physician Assistant

## 2015-12-14 VITALS — BP 154/76 | HR 67 | Ht 71.0 in | Wt 186.8 lb

## 2015-12-14 DIAGNOSIS — Z09 Encounter for follow-up examination after completed treatment for conditions other than malignant neoplasm: Secondary | ICD-10-CM

## 2015-12-14 DIAGNOSIS — G4733 Obstructive sleep apnea (adult) (pediatric): Secondary | ICD-10-CM

## 2015-12-14 DIAGNOSIS — I4892 Unspecified atrial flutter: Secondary | ICD-10-CM

## 2015-12-14 DIAGNOSIS — I214 Non-ST elevation (NSTEMI) myocardial infarction: Secondary | ICD-10-CM

## 2015-12-14 DIAGNOSIS — Z9989 Dependence on other enabling machines and devices: Secondary | ICD-10-CM

## 2015-12-14 DIAGNOSIS — E785 Hyperlipidemia, unspecified: Secondary | ICD-10-CM

## 2015-12-14 DIAGNOSIS — Z951 Presence of aortocoronary bypass graft: Secondary | ICD-10-CM | POA: Diagnosis not present

## 2015-12-14 DIAGNOSIS — I10 Essential (primary) hypertension: Secondary | ICD-10-CM

## 2015-12-14 DIAGNOSIS — I200 Unstable angina: Secondary | ICD-10-CM

## 2015-12-14 MED ORDER — NITROGLYCERIN 0.4 MG SL SUBL: 0.4000 mg | SUBLINGUAL_TABLET | SUBLINGUAL | 3 refills | Status: DC | PRN

## 2015-12-14 NOTE — Patient Instructions (Addendum)
Medication Instructions:  Your physician has recommended you make the following change in your medication:  1  START Nitroglycerin 0.4 s/l USE AS DIRECTED ON THE BOTTLE  Labwork: None ordered  Testing/Procedures: None ordered  Follow-Up: Your physician recommends that you schedule a follow-up appointment in: 12/27/15 WITH KATIE THOMPSON, PA-C   Any Other Special Instructions Will Be Listed Below (If Applicable).    If you need a refill on your cardiac medications before your next appointment, please call your pharmacy.

## 2015-12-15 ENCOUNTER — Ambulatory Visit (INDEPENDENT_AMBULATORY_CARE_PROVIDER_SITE_OTHER): Payer: Medicare Other | Admitting: Endocrinology

## 2015-12-15 ENCOUNTER — Encounter: Payer: Self-pay | Admitting: Endocrinology

## 2015-12-15 VITALS — BP 126/82 | HR 67 | Temp 97.7°F | Resp 16 | Ht 70.0 in | Wt 185.2 lb

## 2015-12-15 DIAGNOSIS — E038 Other specified hypothyroidism: Secondary | ICD-10-CM

## 2015-12-15 DIAGNOSIS — I200 Unstable angina: Secondary | ICD-10-CM

## 2015-12-15 DIAGNOSIS — Z794 Long term (current) use of insulin: Secondary | ICD-10-CM

## 2015-12-15 DIAGNOSIS — E1165 Type 2 diabetes mellitus with hyperglycemia: Secondary | ICD-10-CM | POA: Diagnosis not present

## 2015-12-15 DIAGNOSIS — D509 Iron deficiency anemia, unspecified: Secondary | ICD-10-CM | POA: Diagnosis not present

## 2015-12-15 DIAGNOSIS — E063 Autoimmune thyroiditis: Secondary | ICD-10-CM

## 2015-12-15 DIAGNOSIS — Z23 Encounter for immunization: Secondary | ICD-10-CM

## 2015-12-15 LAB — URINALYSIS, ROUTINE W REFLEX MICROSCOPIC
Bilirubin Urine: NEGATIVE
Hgb urine dipstick: NEGATIVE
Ketones, ur: NEGATIVE
Nitrite: NEGATIVE
SPECIFIC GRAVITY, URINE: 1.01 (ref 1.000–1.030)
TOTAL PROTEIN, URINE-UPE24: NEGATIVE
URINE GLUCOSE: NEGATIVE
UROBILINOGEN UA: 1 (ref 0.0–1.0)
pH: 6.5 (ref 5.0–8.0)

## 2015-12-15 LAB — MICROALBUMIN / CREATININE URINE RATIO
CREATININE, U: 140.3 mg/dL
MICROALB UR: 8.2 mg/dL — AB (ref 0.0–1.9)
Microalb Creat Ratio: 5.8 mg/g (ref 0.0–30.0)

## 2015-12-15 NOTE — Addendum Note (Signed)
Addended by: [name] on: 12/15/2015 01:04 PM   Modules accepted: Orders

## 2015-12-15 NOTE — Progress Notes (Signed)
Patient ID: Barry Officer Sr., male   DOB: [birth year], 82 y.o.   MRN: [id number]   Reason for Appointment: follow-up  of various issues   History of Present Illness   Diagnosis: Type 2 diabetes mellitus, date of diagnosis: 1997.   He has been on basal bolus insulin regimen for a few years.  His blood sugars are usually variably controlled and A1c is usually over 8%  except once was 7.2.  Previously had tried Victoza with some benefit but did not continue this because of perceived side effect of constipation and also out-of-pocket expense  RECENT history:   Insulin regimen: Toujeo insulin 32 units in am  Humalog mix insulin 20 units-30   He has had inconsistent control of his diabetes since at least late 2015 Although his blood sugars improved  after changing Humalog to Humalog mix insulin before each meal in 2/16 he has had labile blood sugars  His A1c is now 7 done in hospital, he was discharged on 9/28 and does not have many blood sugars on his monitor today  His blood sugars are showing the following patterns and has the following problems identified:  FASTING blood sugars are now tending to be lower especially the last 3 days, lowest reading 51  Although his wife thinks he has had some low sugars overnight also he has no other documentation  He is still recovering from his cardiac bypass and overall eating smaller portions  However he still eating some snacks and ice cream in the afternoon  Most likely these are causing readings over 200 sometimes at suppertime  INSULIN: He was supposed to take some insulin in the afternoon for his large snacks but he does not do so  Although he has not checked his readings after evening meal he has not reported symptoms of low sugars despite taking relatively large amount of Humalog mix at suppertime  He has taken metformin although he says that if he takes them both together he will sometimes choke  Monitors blood  glucose: 1-2 times  a day.  Glucometer:  One Touch.  Glucose readings     Mean values apply above for all meters except median for One Touch  PRE-MEAL Fasting 3 PM  Dinner Bedtime Overall  Glucose range: 51-142  113, 197  93-251     Mean/median:     113     Dietician visit: Most recent:1/14.  CDE visit 10/16   Exercise: a little walking  Wt Readings from Last 3 Encounters:  12/15/15 185 lb 3.2 oz (84 kg)  12/14/15 186 lb 12.8 oz (84.7 kg)  12/07/15 182 lb 1.6 oz (82.6 kg)   Lab Results  Component Value Date   HGBA1C 7.0 (H) 11/28/2015   HGBA1C 7.4 (H) 10/31/2015   HGBA1C 7.1 (H) 07/31/2015   Lab Results  Component Value Date   MICROALBUR 2.9 (H) 03/08/2015   LDLCALC 53 10/31/2015   CREATININE 1.14 12/12/2015   Other ACTIVE problems are discussed in review of systems    Lab on 12/12/2015  Component Date Value Ref Range Status  . Sodium 12/12/2015 142  135 - 145 mEq/L Final  . Potassium 12/12/2015 4.7  3.5 - 5.1 mEq/L Final  . Chloride 12/12/2015 109  96 - 112 mEq/L Final  . CO2 12/12/2015 26  19 - 32 mEq/L Final  . Glucose, Bld 12/12/2015 63* 70 - 99 mg/dL Final  . BUN 12/12/2015 9  6 - 23 mg/dL Final  . Creatinine, Ser 12/12/2015 1.14  0.40 - 1.50 mg/dL Final  . Total Bilirubin 12/12/2015 0.4  0.2 - 1.2 mg/dL Final  . Alkaline Phosphatase 12/12/2015 77  39 - 117 U/L Final  . AST 12/12/2015 15  0 - 37 U/L Final  . ALT 12/12/2015 10  0 - 53 U/L Final  . Total Protein 12/12/2015 6.6  6.0 - 8.3 g/dL Final  . Albumin 12/12/2015 2.9* 3.5 - 5.2 g/dL Final  . Calcium 12/12/2015 8.6  8.4 - 10.5 mg/dL Final  . GFR 12/12/2015 65.56  >60.00 mL/min Final  . Fructosamine 12/13/2015 191  0 - 285 umol/L Final   Comment: Published reference interval for apparently healthy subjects between age 60 and 30 is 29 - 285 umol/L and in a poorly controlled diabetic population is 228 - 563 umol/L with a mean of 396 umol/L.   Marland Kitchen TSH 12/12/2015 7.31* 0.35 - 4.50 uIU/mL Final  . Free T4  12/12/2015 0.79  0.60 - 1.60 ng/dL Final   Comment: Specimens from patients who are undergoing biotin therapy and /or ingesting biotin supplements may contain high levels of biotin.  The higher biotin concentration in these specimens interferes with this Free T4 assay.  Specimens that contain high levels  of biotin may cause false high results for this Free T4 assay.  Please interpret results in light of the total clinical presentation of the patient.    . Vitamin B-12 12/12/2015 616  211 - 911 pg/mL Final    OTHER active problems addressed today are reviewed in review of systems     Medication List       Accurate as of 12/15/15 11:28 AM. Always use your most recent med list.          acetaminophen 500 MG tablet Commonly known as:  TYLENOL Take 500 mg by mouth every 8 (eight) hours as needed for mild pain, moderate pain or headache.   albuterol 108 (90 Base) MCG/ACT inhaler Commonly known as:  PROVENTIL HFA;VENTOLIN HFA Inhale 2 puffs into the lungs every 6 (six) hours.   apixaban 5 MG Tabs tablet Commonly known as:  ELIQUIS Take 1 tablet (5 mg total) by mouth 2 (two) times daily.   aspirin EC 81 MG tablet Take 81 mg by mouth daily.   budesonide-formoterol 160-4.5 MCG/ACT inhaler Commonly known as:  SYMBICORT Inhale 2 puffs into the lungs 2 (two) times daily.   CRESTOR 10 MG tablet Generic drug:  rosuvastatin TAKE ONE TABLET BY MOUTH ONCE DAILY   finasteride 5 MG tablet Commonly known as:  PROSCAR Take 5 mg by mouth every morning.   gabapentin 300 MG capsule Commonly known as:  NEURONTIN TAKE ONE CAPSULE BY MOUTH 4 TIMES DAILY   glucose blood test strip Commonly known as:  ONE TOUCH ULTRA TEST Use as instructed to check blood sugar 3 times per day dx code E11.65   HUMALOG MIX 75/25 KWIKPEN (75-25) 100 UNIT/ML Kwikpen Generic drug:  Insulin Lispro Prot & Lispro INJECT 20 UNITS SUBCUTANEOUSLY IN THE MORNING AND 30 UNITS SUBCUTANEOUSLY IN THE EVENING DAILY     levothyroxine 112 MCG tablet Commonly known as:  SYNTHROID, LEVOTHROID TAKE ONE TABLET BY MOUTH ONCE DAILY   lisinopril 40 MG tablet Commonly known as:  PRINIVIL,ZESTRIL Take 1 tablet (40 mg total) by mouth daily.   menthol-thymol Liqd Apply 1 application topically as needed. For pain.   metFORMIN 500 MG 24 hr tablet Commonly known as:  GLUCOPHAGE-XR  TAKE THREE TABLETS BY MOUTH ONCE DAILY WITH SUPPER   NASACORT AQ 55 MCG/ACT Aero nasal inhaler Generic drug:  triamcinolone Place 1-2 sprays into the nose daily.   nitroGLYCERIN 0.4 MG SL tablet Commonly known as:  NITROSTAT Place 1 tablet (0.4 mg total) under the tongue every 5 (five) minutes as needed for chest pain.   polyethylene glycol packet Commonly known as:  MIRALAX / GLYCOLAX Take 17 g by mouth daily as needed for mild constipation, moderate constipation or severe constipation. 1 capful daily as needed   QUEtiapine 25 MG tablet Commonly known as:  SEROQUEL Take 1 tablet (25 mg total) by mouth at bedtime.   SYSTANE 0.4-0.3 % Soln Generic drug:  Polyethyl Glycol-Propyl Glycol Apply 1-2 drops to eye 3 (three) times daily as needed (for dry eyes).   TOUJEO SOLOSTAR 300 UNIT/ML Sopn Generic drug:  Insulin Glargine INJECT 42 UNITS SUBCUTANEOUSLY EVERY MORNING   traMADol 50 MG tablet Commonly known as:  ULTRAM Take 1 tablet (50 mg total) by mouth every 6 (six) hours.   vitamin B-12 1000 MCG tablet Commonly known as:  CYANOCOBALAMIN Take 1,000 mcg by mouth daily.       Allergies:  Allergies  Allergen Reactions  . Penicillins Swelling    Eyes and lips Has patient had a PCN reaction causing immediate rash, facial/tongue/throat swelling, SOB or lightheadedness with hypotension: Yes Has patient had a PCN reaction causing severe rash involving mucus membranes or skin necrosis: No Has patient had a PCN reaction that required hospitalization No Has patient had a PCN reaction occurring within the last 10 years:  No If all of the above answers are "NO", then may proceed with Cephalosporin use   . Prednisone Other (See Comments)    Hyperglycemia and AMS    Past Medical History:  Diagnosis Date  . Anemia    hx of year ago no problems now per pt   . Arthritis    generalized   . Asthma   . Atypical chest pain   . Bladder neck contracture   . Chronic back pain   . Complication of anesthesia    POST OP HYPOTENSION  . Dementia    per wife pt had neurological evaluation  . Diverticulosis of colon   . First degree heart block   . GERD (gastroesophageal reflux disease)   . Heart murmur   . History of basal cell carcinoma excision    forehead and x6 area forearms  . History of Bell's palsy    x2  1998 &  2004 left side --  residual slight left eye droop and vace  . History of chronic bronchitis   . History of colon polyps   . History of gout   . History of hypertension   . History of MI (myocardial infarction)    08/ 2010  secondary acute renal failure/ dehydration--  medical management  . History of pulmonary embolus (PE)    2010  . Hypogonadism male   . Hypotension   . Hypothyroidism   . OSA (obstructive sleep apnea)    severe per study 2007/  non-compliant cpap  . Peripheral vascular disease ([place])   . Prostate carcinoma [facility]) urologist-  dr Gaynelle Arabian   DX 2007  Gleason 6  and recurrent 2012  s/p  cryoablation of prostate both times  . RBBB (right bundle branch block)   . Recovering alcoholic in remission ([place])   . Renal calculus, bilateral   . Type 2 diabetes mellitus ([place])  Past Surgical History:  Procedure Laterality Date  . CARDIAC CATHETERIZATION N/A 11/27/2015   Procedure: Left Heart Cath and Coronary Angiography;  Surgeon: Peter M [name], MD;  Location: [facility];  Service: Cardiovascular;  Laterality: N/A;  . CARDIOVASCULAR STRESS TEST  05-19-2012  dr Tressia Miners turner   normal perfusionstudy/  no ischemia/  attenuation artifact inferoapex region of myocardium/   note gate secondary to rhythm irregularity  . CATARACT EXTRACTION W/ INTRAOCULAR LENS IMPLANT Right 2014  . COLONOSCOPY W/ POLYPECTOMY  last one 08-10-2013  . CORONARY ARTERY BYPASS GRAFT N/A 11/29/2015   Procedure: CORONARY ARTERY BYPASS GRAFTING (CABG), ON PUMP, TIMES FIVE, USING LEFT INTERNAL MAMMARY ARTERY, RIGHT GREATER SAPHENOUS VEIN HARVESTED ENDOSCOPICALLY;  Surgeon: [name], MD;  Location: [facility];  Service: Open Heart Surgery;  Laterality: N/A;  LIMA-LAD; SEQ SVG-OM1-OM2;SVG-DIAG; SVG-PL  . CYSTOSCOPY WITH URETHRAL DILATATION N/A 01/14/2014   Procedure: URETHRAL MEATAL DILATATION;  Surgeon: Ailene Rud, MD;  [facility];  Service: Urology;  Laterality: N/A;  . EXCISION BENIGN SKIN LESION OF BACK  10/ 2011  . HIATAL HERNIA REPAIR  1983  . INGUINAL HERNIA REPAIR Right 10-14-2001  . INTRAOPERATIVE TRANSESOPHAGEAL ECHOCARDIOGRAM N/A 11/29/2015   Procedure: INTRAOPERATIVE TRANSESOPHAGEAL ECHOCARDIOGRAM;  Surgeon: [name], MD;  Location: [facility];  Service: Open Heart Surgery;  Laterality: N/A;  . KNEE ARTHROSCOPY W/ MENISCECTOMY Right 01-25-2003   and chondroplasty  . [place]  . PROSTATE CRYOABLATION  09-17-2010  &  04-12-2005  . REPAIR RIGHT ARM TENDON INJURY  1975  . SHOULDER OPEN ROTATOR CUFF REPAIR  03/20/2011   Procedure: ROTATOR CUFF REPAIR SHOULDER OPEN;  Surgeon: Tobi Bastos;  Location: [facility];  Service: Orthopedics;  Laterality: Right;  . TONSILLECTOMY  as child  . TRANSTHORACIC ECHOCARDIOGRAM  01-11-2013  dr [name]   moderate LVH/  grade I diastolic dysfunction/ ef [id number]  mild AV stenosis /  mild MR/  mild LAE  . TRANSURETHRAL RESECTION OF BLADDER TUMOR N/A 01/14/2014   Procedure: TRANSURETHRAL INCISION OF BLADDER NECK CONTRACTURE;  Surgeon: Ailene Rud, MD;  Location: [facility];  Service: Urology;  Laterality: N/A;  . Vantus Implant     Hormones for Prostate  . VENTRAL HERNIA  REPAIR  1998    Family History  Problem Relation Age of Onset  . Heart disease Mother   . Stroke Father   . Cancer Brother     unsure of type    Social History:  reports that he has never smoked. He has never used smokeless tobacco. He reports that he does not drink alcohol or use drugs.  Review of Systems   B-12 deficiency:He has been on B12 tablets 1000 mg daily  He has  COPD with chronic cough, treated by the pulmonologist and is taking steroid inhaler with good relief of symptoms   HYPERLIPIDEMIA: Well controlled with Crestor   Lab Results  Component Value Date   CHOL 117 10/31/2015   HDL 44.60 10/31/2015   LDLCALC 53 10/31/2015   TRIG 98.0 10/31/2015   CHOLHDL 3 10/31/2015     He was diagnosed have had sleep apnea but he declines CPAP     History of mild hypothyroidism which is Usually well controlled  on 112 g   Not clear why his TSH is higher now, he has been getting this regularly and not taking any iron at the same time   Lab Results  Component Value Date   TSH 7.31 (H) 12/12/2015   ANEMIA: This isSomewhat more prominent since his surgery Not taking any iron currently  Lab Results  Component Value Date   WBC 8.2 12/05/2015   HGB 10.7 (L) 12/05/2015   HCT 34.1 (L) 12/05/2015   MCV 86.5 12/05/2015   PLT 202 12/05/2015        Examination:   BP 126/82   Pulse 67   Temp 97.7 F (36.5 C)   Resp 16   Ht 5\' 10"  (1.778 m)   Wt 185 lb 3.2 oz (84 kg)   SpO2 97%   BMI 26.57 kg/m   Body mass index is 26.57 kg/m.      Assesment/Plan:  Diabetes type 2, uncontrolled  See history of present illness for detailed discussion of his current management, blood sugar patterns and problems identified  A1c is 7% recently Since he has had cardiac surgery he appears to be requiring less insulin Again his highest blood sugars are before supper time she has large snacks in the afternoons sometimes without any insulin coverage Overall since his wife is  helping him with his meals and snacks his blood sugars are not as consistently high Now getting LOW blood sugars fasting even with relatively lower dose of Toujeo Also not clear if he has higher readings after supper or possibly low sugars with his high dose of premixed insulin at suppertime currently  Discussed timing of glucose monitoring, timing of injections and diet He needs to cut back on high-fat snacks like ice cream Probably needs to take insulin in the mid afternoon if he has a large snack and reduce the suppertime dose Will have him check blood sugars more consistently and review of this will be done on the next visit Meanwhile will reduce Toujeo to 26 and suppertime dose to 26 also  ANEMIA: Recently worse likely to be from his cardiac surgery and operative blood loss and recently less optimal nutrition He will start iron supplements    HYPOTHYROIDISM: TSH is higher, he will take an extra half tablet weekly    Patient Instructions  Toueo 26 in am  Mix insulin 20 in and 26 at supper  Thyroid pill take extra 1/2 pill on Saturdays       Counseling time on subjects discussed above is over 50% of today's 25 minute visit   Barry Mcdaniel 12/15/2015, 11:28 AM

## 2015-12-15 NOTE — Patient Instructions (Addendum)
Toueo 26 in am  Mix insulin 20 in and 26 at supper  Thyroid pill take extra 1/2 pill on Saturdays  Iron 1 daily pm

## 2015-12-19 DIAGNOSIS — E1122 Type 2 diabetes mellitus with diabetic chronic kidney disease: Secondary | ICD-10-CM | POA: Diagnosis not present

## 2015-12-19 DIAGNOSIS — I129 Hypertensive chronic kidney disease with stage 1 through stage 4 chronic kidney disease, or unspecified chronic kidney disease: Secondary | ICD-10-CM | POA: Diagnosis not present

## 2015-12-19 DIAGNOSIS — Z48812 Encounter for surgical aftercare following surgery on the circulatory system: Secondary | ICD-10-CM | POA: Diagnosis not present

## 2015-12-19 DIAGNOSIS — N183 Chronic kidney disease, stage 3 (moderate): Secondary | ICD-10-CM | POA: Diagnosis not present

## 2015-12-19 DIAGNOSIS — I2510 Atherosclerotic heart disease of native coronary artery without angina pectoris: Secondary | ICD-10-CM | POA: Diagnosis not present

## 2015-12-19 DIAGNOSIS — I214 Non-ST elevation (NSTEMI) myocardial infarction: Secondary | ICD-10-CM | POA: Diagnosis not present

## 2015-12-21 ENCOUNTER — Other Ambulatory Visit: Payer: Self-pay | Admitting: Endocrinology

## 2015-12-21 DIAGNOSIS — I129 Hypertensive chronic kidney disease with stage 1 through stage 4 chronic kidney disease, or unspecified chronic kidney disease: Secondary | ICD-10-CM | POA: Diagnosis not present

## 2015-12-21 DIAGNOSIS — N183 Chronic kidney disease, stage 3 (moderate): Secondary | ICD-10-CM | POA: Diagnosis not present

## 2015-12-21 DIAGNOSIS — E1122 Type 2 diabetes mellitus with diabetic chronic kidney disease: Secondary | ICD-10-CM | POA: Diagnosis not present

## 2015-12-21 DIAGNOSIS — I2510 Atherosclerotic heart disease of native coronary artery without angina pectoris: Secondary | ICD-10-CM | POA: Diagnosis not present

## 2015-12-21 DIAGNOSIS — I214 Non-ST elevation (NSTEMI) myocardial infarction: Secondary | ICD-10-CM | POA: Diagnosis not present

## 2015-12-21 DIAGNOSIS — Z48812 Encounter for surgical aftercare following surgery on the circulatory system: Secondary | ICD-10-CM | POA: Diagnosis not present

## 2015-12-25 DIAGNOSIS — N183 Chronic kidney disease, stage 3 (moderate): Secondary | ICD-10-CM | POA: Diagnosis not present

## 2015-12-25 DIAGNOSIS — I2510 Atherosclerotic heart disease of native coronary artery without angina pectoris: Secondary | ICD-10-CM | POA: Diagnosis not present

## 2015-12-25 DIAGNOSIS — I214 Non-ST elevation (NSTEMI) myocardial infarction: Secondary | ICD-10-CM | POA: Diagnosis not present

## 2015-12-25 DIAGNOSIS — Z48812 Encounter for surgical aftercare following surgery on the circulatory system: Secondary | ICD-10-CM | POA: Diagnosis not present

## 2015-12-25 DIAGNOSIS — I129 Hypertensive chronic kidney disease with stage 1 through stage 4 chronic kidney disease, or unspecified chronic kidney disease: Secondary | ICD-10-CM | POA: Diagnosis not present

## 2015-12-25 DIAGNOSIS — E1122 Type 2 diabetes mellitus with diabetic chronic kidney disease: Secondary | ICD-10-CM | POA: Diagnosis not present

## 2015-12-25 NOTE — Progress Notes (Signed)
Cardiology Office Note    Date:  12/27/2015   ID:  Barry Officer Sr., DOB October 29, 1932, MRN [id number]  PCP:  [name], MD  Cardiologist:  Dr. [name]   CC: follow up- atrial flutter  History of Present Illness:  Barry LITHERLAND Sr. is a 82 y.o. male with a history of HLD, DMT2, HTN, OSA, PVD, trifascicular block and recently diagnosed CAD s/p CABG x5V with post op atrial flutter who presents to clinic for follow up.   Recently admitted from 9/16-9/28/17. He presented with chest pain and ruled in for NSTEMI (pk trop 1.62). Cath showed critical 3 vessel and left main CAD and he was referred for CABG. Echo results showed mild AS, mild MR and LVEF 45-50%. He underwent a CABG x 5 on 11/29/2015. Post op course was c/b  atrial flutter and volume overload requiring diuresis. Given AV dissociation and history of "trifascicular block," amiodarone and other AV nodal blocking agents were avoided. He was rate-controlled due to intrinsic AV block. Cardiology recommend anticoagulation with Eliquis 5mg  BID and outpatient cardioversion attempt if he does not spontaneously convert in 3 weeks.   I saw him in clinic on 12/14/15. He was doing well but remained in atrial flutter. Plan is for DCCV after 3 weeks of uninterrupted Eliquis.   Today he presents to clinic for follow up. He has been feeling tired and weak.  He hasn't have very good energy. He has not wanted to do much. He has had a little hurting in his right side of the chest. Currently having it now. Not worse with exertion. He has a little hard bump in his left breast. No SOB. No LE edema, orthopnea or PND. No dizziness or syncope.    Past Medical History:  Diagnosis Date  . Anemia    hx of year ago no problems now per pt   . Arthritis    generalized   . Asthma   . Atypical chest pain   . Bladder neck contracture   . Chronic back pain   . Complication of anesthesia    POST OP HYPOTENSION  . Dementia    per wife pt had neurological evaluation  .  Diverticulosis of colon   . First degree heart block   . GERD (gastroesophageal reflux disease)   . Heart murmur   . History of basal cell carcinoma excision    forehead and x6 area forearms  . History of Bell's palsy    x2  1998 &  2004 left side --  residual slight left eye droop and vace  . History of chronic bronchitis   . History of colon polyps   . History of gout   . History of hypertension   . History of MI (myocardial infarction)    08/ 2010  secondary acute renal failure/ dehydration--  medical management  . History of pulmonary embolus (PE)    2010  . Hypogonadism male   . Hypotension   . Hypothyroidism   . OSA (obstructive sleep apnea)    severe per study 2007/  non-compliant cpap  . Peripheral vascular disease ([place])   . Prostate carcinoma [facility]) urologist-  dr Gaynelle Arabian   DX 2007  Gleason 6  and recurrent 2012  s/p  cryoablation of prostate both times  . RBBB (right bundle branch block)   . Recovering alcoholic in remission ([place])   . Renal calculus, bilateral   . Type 2 diabetes mellitus ([place])     Past Surgical History:  Procedure Laterality Date  . CARDIAC CATHETERIZATION N/A 11/27/2015   Procedure: Left Heart Cath and Coronary Angiography;  Surgeon: Peter M [name], MD;  Location: [facility];  Service: Cardiovascular;  Laterality: N/A;  . CARDIOVASCULAR STRESS TEST  05-19-2012  dr Tressia Miners turner   normal perfusionstudy/  no ischemia/  attenuation artifact inferoapex region of myocardium/  note gate secondary to rhythm irregularity  . CATARACT EXTRACTION W/ INTRAOCULAR LENS IMPLANT Right 2014  . COLONOSCOPY W/ POLYPECTOMY  last one 08-10-2013  . CORONARY ARTERY BYPASS GRAFT N/A 11/29/2015   Procedure: CORONARY ARTERY BYPASS GRAFTING (CABG), ON PUMP, TIMES FIVE, USING LEFT INTERNAL MAMMARY ARTERY, RIGHT GREATER SAPHENOUS VEIN HARVESTED ENDOSCOPICALLY;  Surgeon: [name], MD;  Location: [facility];  Service: Open Heart Surgery;  Laterality: N/A;  LIMA-LAD;  SEQ SVG-OM1-OM2;SVG-DIAG; SVG-PL  . CYSTOSCOPY WITH URETHRAL DILATATION N/A 01/14/2014   Procedure: URETHRAL MEATAL DILATATION;  Surgeon: Ailene Rud, MD;  Location: [facility];  Service: Urology;  Laterality: N/A;  . EXCISION BENIGN SKIN LESION OF BACK  10/ 2011  . HIATAL HERNIA REPAIR  1983  . INGUINAL HERNIA REPAIR Right 10-14-2001  . INTRAOPERATIVE TRANSESOPHAGEAL ECHOCARDIOGRAM N/A 11/29/2015   Procedure: INTRAOPERATIVE TRANSESOPHAGEAL ECHOCARDIOGRAM;  Surgeon: [name], MD;  Location: [facility];  Service: Open Heart Surgery;  Laterality: N/A;  . KNEE ARTHROSCOPY W/ MENISCECTOMY Right 01-25-2003   and chondroplasty  . [place]  . PROSTATE CRYOABLATION  09-17-2010  &  04-12-2005  . REPAIR RIGHT ARM TENDON INJURY  1975  . SHOULDER OPEN ROTATOR CUFF REPAIR  03/20/2011   Procedure: ROTATOR CUFF REPAIR SHOULDER OPEN;  Surgeon: Tobi Bastos;  Location: [facility];  Service: Orthopedics;  Laterality: Right;  . TONSILLECTOMY  as child  . TRANSTHORACIC ECHOCARDIOGRAM  01-11-2013  dr [name]   moderate LVH/  grade I diastolic dysfunction/ ef [id number]  mild AV stenosis /  mild MR/  mild LAE  . TRANSURETHRAL RESECTION OF BLADDER TUMOR N/A 01/14/2014   Procedure: TRANSURETHRAL INCISION OF BLADDER NECK CONTRACTURE;  Surgeon: Ailene Rud, MD;  Location: [facility];  Service: Urology;  Laterality: N/A;  . Vantus Implant     Hormones for Prostate  . VENTRAL HERNIA REPAIR  1998    Current Medications: Outpatient Medications Prior to Visit  Medication Sig Dispense Refill  . acetaminophen (TYLENOL) 500 MG tablet Take 500 mg by mouth every 8 (eight) hours as needed for mild pain, moderate pain or headache.     . albuterol (PROVENTIL HFA;VENTOLIN HFA) 108 (90 Base) MCG/ACT inhaler Inhale 2 puffs into the lungs every 6 (six) hours. 1 Inhaler 1  . apixaban (ELIQUIS) 5 MG TABS tablet Take 1 tablet (5 mg total) by mouth 2 (two) times daily.  60 tablet 1  . aspirin EC 81 MG tablet Take 81 mg by mouth daily.     . budesonide-formoterol (SYMBICORT) 160-4.5 MCG/ACT inhaler Inhale 2 puffs into the lungs 2 (two) times daily. 3 Inhaler 3  . CRESTOR 10 MG tablet TAKE ONE TABLET BY MOUTH ONCE DAILY 90 tablet 1  . finasteride (PROSCAR) 5 MG tablet Take 5 mg by mouth every morning.     . gabapentin (NEURONTIN) 300 MG capsule TAKE ONE CAPSULE BY MOUTH 4 TIMES DAILY 120 capsule 3  . glucose blood (ONE TOUCH ULTRA TEST) test strip Use as instructed to check blood sugar 3 times per day dx code E11.65 100 each 5  .  lisinopril (PRINIVIL,ZESTRIL) 40 MG tablet Take 1 tablet (40 mg total) by mouth daily. 30 tablet 1  . menthol-thymol (ABSORBINE JR) LIQD Apply 1 application topically as needed. For pain.    . nitroGLYCERIN (NITROSTAT) 0.4 MG SL tablet Place 1 tablet (0.4 mg total) under the tongue every 5 (five) minutes as needed for chest pain. 25 tablet 3  . Polyethyl Glycol-Propyl Glycol (SYSTANE) 0.4-0.3 % SOLN Apply 1-2 drops to eye 3 (three) times daily as needed (for dry eyes).    . polyethylene glycol (MIRALAX / GLYCOLAX) packet Take 17 g by mouth daily as needed for mild constipation, moderate constipation or severe constipation. 1 capful daily as needed    . traMADol (ULTRAM) 50 MG tablet Take 1 tablet (50 mg total) by mouth every 6 (six) hours. 30 tablet 0  . triamcinolone (NASACORT AQ) 55 MCG/ACT AERO nasal inhaler Place 1-2 sprays into the nose daily.     . vitamin B-12 (CYANOCOBALAMIN) 1000 MCG tablet Take 1,000 mcg by mouth daily.    Marland Kitchen HUMALOG MIX 75/25 KWIKPEN (75-25) 100 UNIT/ML Kwikpen INJECT 20 UNITS SUBCUTANEOUSLY IN THE MORNING AND 30 UNITS SUBCUTANEOUSLY IN THE EVENING DAILY (Patient taking differently: Inject 20 units into the skin in the morning then 14 units in the afternoon then 16 units at bedtime) 45 mL 0  . levothyroxine (SYNTHROID, LEVOTHROID) 112 MCG tablet TAKE ONE TABLET BY MOUTH ONCE DAILY (Patient taking differently: Take  112 mcg by mouth once a day) 90 tablet 0  . metFORMIN (GLUCOPHAGE-XR) 500 MG 24 hr tablet TAKE THREE TABLETS BY MOUTH ONCE DAILY WITH SUPPER (Patient taking differently: Take 1,000 mg by mouth in the evening) 90 tablet 5  . QUEtiapine (SEROQUEL) 25 MG tablet Take 1 tablet (25 mg total) by mouth at bedtime. 30 tablet 1  . TOUJEO SOLOSTAR 300 UNIT/ML SOPN INJECT 42 UNITS SUBCUTANEOUSLY EVERY MORNING (Patient taking differently: Inject 32 units into the skin in the morning) 6 mL 3   No facility-administered medications prior to visit.      Allergies:   Penicillins and Prednisone   Social History   Social History  . Marital status: Married    Spouse name: N/A  . Number of children: N/A  . Years of education: N/A   Occupational History  . Retired      Personal assistant    Social History Main Topics  . Smoking status: Never Smoker  . Smokeless tobacco: Never Used  . Alcohol use No     Comment: RECOVERING ALCOHOLIC none since [id number]   . Drug use: No  . Sexual activity: Not Currently   Other Topics Concern  . None   Social History Narrative  . None     Family History:  The patient's family history includes Cancer in his brother; Heart disease in his mother; Stroke in his father.     ROS:   Please see the history of present illness.    ROS All other systems reviewed and are negative.   PHYSICAL EXAM:   VS:  BP 138/70   Pulse 76   Ht 5\' 10"  (1.778 m)   Wt 184 lb (83.5 kg)   BMI 26.40 kg/m    GEN: Well nourished, well developed, in no acute distress  HEENT: normal  Neck: no JVD, carotid bruits, or masses Cardiac: irreg irreg; no murmurs, rubs, or gallops,no edema  Respiratory:  rhoncorous on auscultation bilaterally, normal work of breathing GI: soft, nontender, nondistended, + BS MS: no deformity or atrophy  Skin: warm and dry, no rash Neuro:  Alert and Oriented x 3, Strength and sensation are intact Psych: euthymic mood, full affect  Wt Readings from Last 3 Encounters:    12/27/15 184 lb (83.5 kg)  12/15/15 185 lb 3.2 oz (84 kg)  12/14/15 186 lb 12.8 oz (84.7 kg)      Studies/Labs Reviewed:   EKG:  EKG is ordered today.  The ekg ordered today demonstrates atrial flutter with var block LAD, RBBB  Recent Labs: 11/25/2015: B Natriuretic Peptide 1,139.9 11/30/2015: Magnesium 2.2 12/05/2015: Hemoglobin 10.7; Platelets 202 12/12/2015: ALT 10; BUN 9; Creatinine, Ser 1.14; Potassium 4.7; Sodium 142; TSH 7.31   Lipid Panel    Component Value Date/Time   CHOL 117 10/31/2015 1340   TRIG 98.0 10/31/2015 1340   HDL 44.60 10/31/2015 1340   CHOLHDL 3 10/31/2015 1340   VLDL 19.6 10/31/2015 1340   LDLCALC 53 10/31/2015 1340    Additional studies/ records that were reviewed today include:  Left Heart Cath and Coronary Angiography by Dr. [name] on 11/27/2015:  Conclusion     Ost LM to LM lesion, 80 %stenosed.  Prox LAD lesion, 80 %stenosed.  Mid LAD lesion, 90 %stenosed.  Dist LAD lesion, 95 %stenosed.  Ost 1st Diag to 1st Diag lesion, 100 %stenosed.  Ost Ramus lesion, 90 %stenosed.  Ost Cx to Prox Cx lesion, 90 %stenosed.  Ost 1st Mrg to 1st Mrg lesion, 80 %stenosed.  Prox Cx to Mid Cx lesion, 100 %stenosed.  Prox RCA lesion, 95 %stenosed.  Dist RCA lesion, 90 %stenosed.  1. Critical 3 vessel and left main CAD 2. Mildly elevated LV EDP 3. No significant AV gradient by pullback.    Coronary artery bypass grafting x5 with left internal mammary to the left anterior descending coronary artery reverse saphenous vein graft to the diagonal coronary artery, sequential reverse saphenous vein graft to the first and second obtuse marginal reverse saphenous vein graft to the posterior lateral branch of the right coronary artery with right thigh and calf greater saphenous vein harvesting endoscopically by Dr. Servando Snare on 11/29/2015.   2D ECHO: 11/28/2015 LV EF: 45% -   50% Study Conclusions - Left ventricle: The cavity size was normal.  There was severe   concentric hypertrophy. Systolic function was mildly reduced. The   estimated ejection fraction was in the range of 45% to 50%.   Hypokinesis of the apical, mid-apical anteroseptal and   inferoseptal myocardium, and apical inferolateral, and inferior   myocardium. Doppler parameters are consistent with a reversible   restrictive pattern, indicative of decreased left ventricular   diastolic compliance and/or increased left atrial pressure (grade   3 diastolic dysfunction). - Aortic valve: Trileaflet; moderately thickened, moderately   calcified leaflets. Valve mobility was restricted. There was mild   stenosis. There was no regurgitation. Valve area (VTI): 0.88   cm^2. Valve area (Vmax): 0.96 cm^2. Valve area (Vmean): 0.86   cm^2. - Mitral valve: Calcified annulus. Transvalvular velocity was   within the normal range. There was no evidence for stenosis.   There was mild regurgitation. - Left atrium: The atrium was severely dilated. - Right ventricle: The cavity size was normal. Wall thickness was   normal. Systolic function was normal. - Atrial septum: A patent foramen ovale cannot be excluded. - Tricuspid valve: There was mild regurgitation. Impressions: - Aortic valve appears heavily calcified and thickend. There   appears to be mild aortic stenosis by peak velocity and mean   gradients. However, given  reduced systolic function, cannot rule   out more severe valvular heart disease or &quot;low flow, low   gradient&quot; aortic stenosis.   ASSESSMENT & PLAN:   CAD: LM 3VD seen by s/p CABG x5V. Continue ASA and statin.   Post op atrial flutter: on no AV nodal blocking agents. Continue Eliquis 5mg  BID for CHADSVASC of 6 (CHF, HTN, age, DM, CAD). Still in atrial flutter. Will plan for DCCV now since he had been on 3 consecutive weeks of uninterrupted Eliquis. Will do this Friday Am with Dr. Meda Coffee. I hope his general malaise will improve with restoration of NSR.  Hopefully this will stick, he has a severely dilated left atrium. If he doesn't hold NSR, we can have him see EP for possible flutter ablation.  Bifascicular block, Mobitz type I: significant conduction abnormality seen on EKG, avoiding Beta blockers. No syncope.  Chronic kidney disease stage III: creat stable at 1.14  Hyperlipidemia: LDL 53. Continue Crestor 10mg  daily.   Diabetes with nephropathy: continue current regimen and follow up with Dr. Dwyane Dee  HTN: BP well controlled on current regimen   Medication Adjustments/Labs and Tests Ordered: Current medicines are reviewed at length with the patient today.  Concerns regarding medicines are outlined above.  Medication changes, Labs and Tests ordered today are listed in the Patient Instructions below. Patient Instructions  Medication Instructions:  Your physician recommends that you continue on your current medications as directed. Please refer to the Current Medication list given to you today.   Labwork: TODAY:  BMET, CBC W/DIFF, & PT/INR  Testing/Procedures: Your physician has recommended that you have a Cardioversion (DCCV). Electrical Cardioversion uses a jolt of electricity to your heart either through paddles or wired patches attached to your chest. This is a controlled, usually prescheduled, procedure. Defibrillation is done under light anesthesia in the hospital, and you usually go home the day of the procedure. This is done to get your heart back into a normal rhythm. You are not awake for the procedure. Please see the instruction sheet given to you today.   Follow-Up: Your physician recommends that you schedule a follow-up appointment in: [place], PA-C   Any Other Special Instructions Will Be Listed Below (If Applicable).   Electrical Cardioversion Electrical cardioversion is the delivery of a jolt of electricity to change the rhythm of the heart. Sticky patches or metal paddles are placed on the  chest to deliver the electricity from a device. This is done to restore a normal rhythm. A rhythm that is too fast or not regular keeps the heart from pumping well. Electrical cardioversion is done in an emergency if:   There is low or no blood pressure as a result of the heart rhythm.   Normal rhythm must be restored as fast as possible to protect the brain and heart from further damage.   It may save a life. Cardioversion may be done for heart rhythms that are not immediately life threatening, such as atrial fibrillation or flutter, in which:   The heart is beating too fast or is not regular.   Medicine to change the rhythm has not worked.   It is safe to wait in order to allow time for preparation.  Symptoms of the abnormal rhythm are bothersome.  The risk of stroke and other serious problems can be reduced. LET [facility] CARE PROVIDER KNOW ABOUT:   Any allergies you have.  All medicines you are taking, including vitamins, herbs, eye drops, creams, and  over-the-counter medicines.  Previous problems you or members of your family have had with the use of anesthetics.   Any blood disorders you have.   Previous surgeries you have had.   Medical conditions you have. RISKS AND COMPLICATIONS  Generally, this is a safe procedure. However, problems can occur and include:   Breathing problems related to the anesthetic used.  A blood clot that breaks free and travels to other parts of your body. This could cause a stroke or other problems. The risk of this is lowered by use of blood-thinning medicine (anticoagulant) prior to the procedure.  Cardiac arrest (rare). BEFORE THE PROCEDURE   You may have tests to detect blood clots in your heart and to evaluate heart function.  You may start taking anticoagulants so your blood does not clot as easily.   Medicines may be given to help stabilize your heart rate and rhythm. PROCEDURE  You will be given medicine through an  IV tube to reduce discomfort and make you sleepy (sedative).   An electrical shock will be delivered. AFTER THE PROCEDURE Your heart rhythm will be watched to make sure it does not change.    This information is not intended to replace advice given to you by your health care provider. Make sure you discuss any questions you have with your health care provider.   Document Released: 02/15/2002 Document Revised: 03/18/2014 Document Reviewed: 09/09/2012 Elsevier Interactive Patient Education [employer].    If you need a refill on your cardiac medications before your next appointment, please call your pharmacy.      Signed, Angelena Form, PA-C  12/27/2015 9:51 AM    [facility] [address], [place], [place]  [address] Phone: [phone]; Fax: [phone]

## 2015-12-27 ENCOUNTER — Encounter: Payer: Self-pay | Admitting: Physician Assistant

## 2015-12-27 ENCOUNTER — Telehealth: Payer: Self-pay | Admitting: Endocrinology

## 2015-12-27 ENCOUNTER — Encounter: Payer: Self-pay | Admitting: *Deleted

## 2015-12-27 ENCOUNTER — Ambulatory Visit (INDEPENDENT_AMBULATORY_CARE_PROVIDER_SITE_OTHER): Payer: Medicare Other | Admitting: Physician Assistant

## 2015-12-27 VITALS — BP 138/70 | HR 76 | Ht 70.0 in | Wt 184.0 lb

## 2015-12-27 DIAGNOSIS — I200 Unstable angina: Secondary | ICD-10-CM

## 2015-12-27 DIAGNOSIS — I10 Essential (primary) hypertension: Secondary | ICD-10-CM | POA: Diagnosis not present

## 2015-12-27 DIAGNOSIS — I4892 Unspecified atrial flutter: Secondary | ICD-10-CM | POA: Diagnosis not present

## 2015-12-27 LAB — CBC WITH DIFFERENTIAL/PLATELET
Basophils Absolute: 0 cells/uL (ref 0–200)
Basophils Relative: 0 %
Eosinophils Absolute: 276 cells/uL (ref 15–500)
Eosinophils Relative: 3 %
HEMATOCRIT: 35.9 % — AB (ref 38.5–50.0)
Hemoglobin: 11.4 g/dL — ABNORMAL LOW (ref 13.2–17.1)
LYMPHS PCT: 13 %
Lymphs Abs: 1196 cells/uL (ref 850–3900)
MCH: 27.3 pg (ref 27.0–33.0)
MCHC: 31.8 g/dL — AB (ref 32.0–36.0)
MCV: 86.1 fL (ref 80.0–100.0)
MONO ABS: 552 {cells}/uL (ref 200–950)
MPV: 10.8 fL (ref 7.5–12.5)
Monocytes Relative: 6 %
NEUTROS PCT: 78 %
Neutro Abs: 7176 cells/uL (ref 1500–7800)
Platelets: 206 10*3/uL (ref 140–400)
RBC: 4.17 MIL/uL — AB (ref 4.20–5.80)
RDW: 15.8 % — AB (ref 11.0–15.0)
WBC: 9.2 10*3/uL (ref 3.8–10.8)

## 2015-12-27 LAB — BASIC METABOLIC PANEL
BUN: 14 mg/dL (ref 7–25)
CALCIUM: 9 mg/dL (ref 8.6–10.3)
CO2: 25 mmol/L (ref 20–31)
CREATININE: 1.17 mg/dL — AB (ref 0.70–1.11)
Chloride: 109 mmol/L (ref 98–110)
Glucose, Bld: 82 mg/dL (ref 65–99)
Potassium: 4.5 mmol/L (ref 3.5–5.3)
SODIUM: 142 mmol/L (ref 135–146)

## 2015-12-27 LAB — PROTIME-INR
INR: 1
Prothrombin Time: 11.1 s (ref 9.0–11.5)

## 2015-12-27 NOTE — Telephone Encounter (Signed)
Mrs. Maheshwari requests a call back from [place] at her earliest convenience.

## 2015-12-27 NOTE — Patient Instructions (Addendum)
Medication Instructions:  Your physician recommends that you continue on your current medications as directed. Please refer to the Current Medication list given to you today.   Labwork: TODAY:  BMET, CBC W/DIFF, & PT/INR  Testing/Procedures: Your physician has recommended that you have a Cardioversion (DCCV). Electrical Cardioversion uses a jolt of electricity to your heart either through paddles or wired patches attached to your chest. This is a controlled, usually prescheduled, procedure. Defibrillation is done under light anesthesia in the hospital, and you usually go home the day of the procedure. This is done to get your heart back into a normal rhythm. You are not awake for the procedure. Please see the instruction sheet given to you today.   Follow-Up: Your physician recommends that you schedule a follow-up appointment in: [place], PA-C   Any Other Special Instructions Will Be Listed Below (If Applicable).   Electrical Cardioversion Electrical cardioversion is the delivery of a jolt of electricity to change the rhythm of the heart. Sticky patches or metal paddles are placed on the chest to deliver the electricity from a device. This is done to restore a normal rhythm. A rhythm that is too fast or not regular keeps the heart from pumping well. Electrical cardioversion is done in an emergency if:   There is low or no blood pressure as a result of the heart rhythm.   Normal rhythm must be restored as fast as possible to protect the brain and heart from further damage.   It may save a life. Cardioversion may be done for heart rhythms that are not immediately life threatening, such as atrial fibrillation or flutter, in which:   The heart is beating too fast or is not regular.   Medicine to change the rhythm has not worked.   It is safe to wait in order to allow time for preparation.  Symptoms of the abnormal rhythm are bothersome.  The risk of stroke and  other serious problems can be reduced. LET [facility] CARE PROVIDER KNOW ABOUT:   Any allergies you have.  All medicines you are taking, including vitamins, herbs, eye drops, creams, and over-the-counter medicines.  Previous problems you or members of your family have had with the use of anesthetics.   Any blood disorders you have.   Previous surgeries you have had.   Medical conditions you have. RISKS AND COMPLICATIONS  Generally, this is a safe procedure. However, problems can occur and include:   Breathing problems related to the anesthetic used.  A blood clot that breaks free and travels to other parts of your body. This could cause a stroke or other problems. The risk of this is lowered by use of blood-thinning medicine (anticoagulant) prior to the procedure.  Cardiac arrest (rare). BEFORE THE PROCEDURE   You may have tests to detect blood clots in your heart and to evaluate heart function.  You may start taking anticoagulants so your blood does not clot as easily.   Medicines may be given to help stabilize your heart rate and rhythm. PROCEDURE  You will be given medicine through an IV tube to reduce discomfort and make you sleepy (sedative).   An electrical shock will be delivered. AFTER THE PROCEDURE Your heart rhythm will be watched to make sure it does not change.    This information is not intended to replace advice given to you by your health care provider. Make sure you discuss any questions you have with your health care provider.  Document Released: 02/15/2002 Document Revised: 03/18/2014 Document Reviewed: 09/09/2012 Elsevier Interactive Patient Education [employer].    If you need a refill on your cardiac medications before your next appointment, please call your pharmacy.

## 2015-12-28 ENCOUNTER — Telehealth: Payer: Self-pay | Admitting: Physician Assistant

## 2015-12-28 NOTE — Telephone Encounter (Signed)
Patient has been made aware to resume eliquis twice daily and not miss any doses. We will plan for TEE/DCCV early next week since he has missed several doses of Eliquis this past week. He has not been feeling well and I want to see if restoring NSR will improve his symptoms.

## 2015-12-28 NOTE — Telephone Encounter (Signed)
New message     Pt is due to have a cardioversion tomorrow.  Wife want to talk to the nurse regarding his medication

## 2015-12-28 NOTE — Telephone Encounter (Signed)
Called pt to let him know that his cardioversion has been cancelled due to missed doses of eliquis and we will contact him back tomorrow, 12/29/15 when the provider is back in the office.  Pt agreeable with this plan.

## 2015-12-28 NOTE — Telephone Encounter (Signed)
Returned pts call.  The wife had noticed that she has missed giving pt his Eliquis nightly dose for the past 6 nights.  He has taking the a.m dose, but not the nightly. Wife very apologetic, but thought she needed to call due to pt scheduled for DCCV tomorrow.  I know we need to cancel, but when can we reschedule?  They have been advised that it is IMPERATIVE that pt does not miss any more doses of Eliquis from here on out, and they verbalized understanding.  Please advise on the next step!

## 2015-12-28 NOTE — Telephone Encounter (Signed)
Patient wife called you back

## 2015-12-29 ENCOUNTER — Other Ambulatory Visit: Payer: Self-pay | Admitting: *Deleted

## 2015-12-29 ENCOUNTER — Encounter (HOSPITAL_COMMUNITY): Payer: Self-pay

## 2015-12-29 ENCOUNTER — Ambulatory Visit (HOSPITAL_COMMUNITY): Admit: 2015-12-29 | Payer: Medicare Other | Admitting: Cardiology

## 2015-12-29 ENCOUNTER — Telehealth: Payer: Self-pay | Admitting: *Deleted

## 2015-12-29 SURGERY — CARDIOVERSION
Anesthesia: Monitor Anesthesia Care

## 2015-12-29 MED ORDER — GABAPENTIN 300 MG PO CAPS: ORAL_CAPSULE | ORAL | 1 refills | Status: DC

## 2015-12-29 NOTE — Telephone Encounter (Signed)
Barry Mcdaniel called and said her husband has a lump in his left breast, she said it feels like the size of half of a marble.   She also wants to know if his Gabapentin can be increased, he wants to know if he can take 2 tablets 3 times a day, he has a lot of burning, tingling in his feet.  Please advise

## 2015-12-29 NOTE — Telephone Encounter (Signed)
He can take gabapentin 2 capsules of the 300 mg up to 3 times a day. Will need to check the lump in the office, not urgent

## 2015-12-29 NOTE — Telephone Encounter (Signed)
Detailed message left on patients answering machine. New Rx sent

## 2016-01-03 ENCOUNTER — Other Ambulatory Visit: Payer: Self-pay | Admitting: Cardiothoracic Surgery

## 2016-01-03 ENCOUNTER — Other Ambulatory Visit: Payer: Self-pay | Admitting: Physician Assistant

## 2016-01-03 DIAGNOSIS — Z951 Presence of aortocoronary bypass graft: Secondary | ICD-10-CM

## 2016-01-03 NOTE — Telephone Encounter (Signed)
Pt wife, Coralyn Helling, [place] on file, returned my call.  Pt has been scheduled for TEE/DCCV for 01/05/16, with pt arriving at 8:30. They already had the instruction sheet from the previously scheduled DCCV that was cancelled on 12/29/15, so me and wife verbally went back over them, and she verbalized understanding and appreciation.  Will fwd to provider, Nell Range, PA-C so she can put the orders in.

## 2016-01-03 NOTE — Telephone Encounter (Signed)
Tried to call pt to set him up for TEE/CARDIOVERSION.  lmptcb jw 01/03/16

## 2016-01-04 ENCOUNTER — Ambulatory Visit (INDEPENDENT_AMBULATORY_CARE_PROVIDER_SITE_OTHER): Payer: Self-pay | Admitting: Cardiothoracic Surgery

## 2016-01-04 ENCOUNTER — Ambulatory Visit
Admission: RE | Admit: 2016-01-04 | Discharge: 2016-01-04 | Disposition: A | Discharge status: Home / Self Care | Payer: Medicare Other | Source: Ambulatory Visit | Attending: Cardiothoracic Surgery | Admitting: Cardiothoracic Surgery

## 2016-01-04 ENCOUNTER — Encounter: Payer: Self-pay | Admitting: Cardiothoracic Surgery

## 2016-01-04 VITALS — BP 160/80 | HR 74 | Resp 18 | Ht 70.0 in | Wt 184.0 lb

## 2016-01-04 DIAGNOSIS — J90 Pleural effusion, not elsewhere classified: Secondary | ICD-10-CM | POA: Diagnosis not present

## 2016-01-04 DIAGNOSIS — Z951 Presence of aortocoronary bypass graft: Secondary | ICD-10-CM

## 2016-01-04 NOTE — Progress Notes (Signed)
[address]       [place] [address]             [phone]      Barry D Kirks Sr. [place] Medical Record [id number] Date of Birth: 14-Feb-1933  Referring: Josue Hector, MD Primary Care: [name], MD  Chief Complaint:   POST OP FOLLOW UP 11/29/2015   OPERATIVE REPORT PREOPERATIVE DIAGNOSIS:  Three-vessel coronary artery disease with non- ST-elevation myocardial infarction, including left main obstruction. POSTOPERATIVE DIAGNOSIS:  Three-vessel coronary artery disease with non- ST-elevation myocardial infarction, including left main obstruction. SURGICAL PROCEDURE:  Coronary artery bypass grafting x5 with left internal mammary to the left anterior descending coronary artery reverse saphenous vein graft to the diagonal coronary artery, sequential reverse saphenous vein graft to the first and second obtuse marginal reverse saphenous vein graft to the posterior lateral branch of the right coronary artery with right thigh and calf greater saphenous vein harvesting endoscopically. SURGEON:  Lanelle Bal, MD  History of Present Illness:     Considering the patient's poor preoperative state he is making relatively good progress postoperatively. He's increased his physical activity appropriately. He said to come in tomorrow for TEE cardioversion for his underlying atrial flutter. His wife are concerned about the cost of anticoagulation and will talk to the cardiologist concerning this.     Past Medical History:  Diagnosis Date  . Anemia    hx of year ago no problems now per pt   . Arthritis    generalized   . Asthma   . Atypical chest pain   . Bladder neck contracture   . Chronic back pain   . Complication of anesthesia    POST OP HYPOTENSION  . Dementia    per wife pt had neurological evaluation  . Diverticulosis of colon   . First degree heart block   . GERD (gastroesophageal reflux disease)   . Heart murmur   . History of basal cell  carcinoma excision    forehead and x6 area forearms  . History of Bell's palsy    x2  1998 &  2004 left side --  residual slight left eye droop and vace  . History of chronic bronchitis   . History of colon polyps   . History of gout   . History of hypertension   . History of MI (myocardial infarction)    08/ 2010  secondary acute renal failure/ dehydration--  medical management  . History of pulmonary embolus (PE)    2010  . Hypogonadism male   . Hypotension   . Hypothyroidism   . OSA (obstructive sleep apnea)    severe per study 2007/  non-compliant cpap  . Peripheral vascular disease ([place])   . Prostate carcinoma [facility]) urologist-  dr Gaynelle Arabian   DX 2007  Gleason 6  and recurrent 2012  s/p  cryoablation of prostate both times  . RBBB (right bundle branch block)   . Recovering alcoholic in remission ([place])   . Renal calculus, bilateral   . Type 2 diabetes mellitus ([place])      History  Smoking Status  . Never Smoker  Smokeless Tobacco  . Never Used    History  Alcohol Use No    Comment: RECOVERING ALCOHOLIC none since [id number]      Allergies  Allergen Reactions  . Penicillins Swelling    Eyes and lips Has patient had a PCN reaction causing immediate rash, facial/tongue/throat swelling, SOB or lightheadedness  with hypotension: Yes Has patient had a PCN reaction causing severe rash involving mucus membranes or skin necrosis: No Has patient had a PCN reaction that required hospitalization No Has patient had a PCN reaction occurring within the last 10 years: No If all of the above answers are "NO", then may proceed with Cephalosporin use   . Prednisone Other (See Comments)    Hyperglycemia and AMS    Current Outpatient Prescriptions  Medication Sig Dispense Refill  . acetaminophen (TYLENOL) 500 MG tablet Take 500 mg by mouth every 8 (eight) hours as needed for mild pain, moderate pain or headache.     . albuterol (PROVENTIL HFA;VENTOLIN HFA) 108 (90 Base) MCG/ACT inhaler  Inhale 2 puffs into the lungs every 6 (six) hours. 1 Inhaler 1  . apixaban (ELIQUIS) 5 MG TABS tablet Take 1 tablet (5 mg total) by mouth 2 (two) times daily. 60 tablet 1  . aspirin EC 81 MG tablet Take 81 mg by mouth daily.     . budesonide-formoterol (SYMBICORT) 160-4.5 MCG/ACT inhaler Inhale 2 puffs into the lungs 2 (two) times daily. 3 Inhaler 3  . CRESTOR 10 MG tablet TAKE ONE TABLET BY MOUTH ONCE DAILY 90 tablet 1  . ferrous sulfate 325 (65 FE) MG EC tablet Take 325 mg by mouth daily with breakfast.    . finasteride (PROSCAR) 5 MG tablet Take 5 mg by mouth every morning.     . gabapentin (NEURONTIN) 300 MG capsule TAKE 2 CAPSULES BY MOUTH 3 TIMES DAILY 540 capsule 1  . glucose blood (ONE TOUCH ULTRA TEST) test strip Use as instructed to check blood sugar 3 times per day dx code E11.65 100 each 5  . Insulin Glargine (TOUJEO SOLOSTAR) 300 UNIT/ML SOPN Inject 26 Units into the skin every morning.    . insulin lispro protamine-lispro (HUMALOG 75/25 MIX) (75-25) 100 UNIT/ML SUSP injection Inject 20 units subcutaneously every morning and inject 26 units subcutaneously in the evening daily    . levothyroxine (SYNTHROID, LEVOTHROID) 112 MCG tablet Take 112 mcg by mouth daily before breakfast.    . lisinopril (PRINIVIL,ZESTRIL) 40 MG tablet Take 1 tablet (40 mg total) by mouth daily. 30 tablet 1  . menthol-thymol (ABSORBINE JR) LIQD Apply 1 application topically as needed. For pain.    . metFORMIN (GLUCOPHAGE) 500 MG tablet Take 1,000 mg by mouth daily with supper.    . nitroGLYCERIN (NITROSTAT) 0.4 MG SL tablet Place 1 tablet (0.4 mg total) under the tongue every 5 (five) minutes as needed for chest pain. 25 tablet 3  . Polyethyl Glycol-Propyl Glycol (SYSTANE) 0.4-0.3 % SOLN Apply 1-2 drops to eye 3 (three) times daily as needed (for dry eyes).    . polyethylene glycol (MIRALAX / GLYCOLAX) packet Take 17 g by mouth daily as needed for mild constipation, moderate constipation or severe constipation. 1  capful daily as needed    . traMADol (ULTRAM) 50 MG tablet Take 1 tablet (50 mg total) by mouth every 6 (six) hours. 30 tablet 0  . triamcinolone (NASACORT AQ) 55 MCG/ACT AERO nasal inhaler Place 1-2 sprays into the nose daily.     . vitamin B-12 (CYANOCOBALAMIN) 1000 MCG tablet Take 1,000 mcg by mouth daily.     No current facility-administered medications for this visit.        Physical Exam: BP (!) 160/80 (BP Location: Right Arm, Patient Position: Sitting, Cuff Size: Normal)   Pulse 74   Resp 18   Ht 5\' 10"  (1.778 m)  Wt 184 lb (83.5 kg)   SpO2 98% Comment: RA  BMI 26.40 kg/m   General appearance: alert, cooperative and appears stated age Neurologic: intact Heart: regular rate and rhythm, S1, S2 normal, no murmur, click, rub or gallop Lungs: diminished breath sounds bilaterally Abdomen: soft, non-tender; bowel sounds normal; no masses,  no organomegaly Extremities: extremities normal, atraumatic, no cyanosis or edema and Homans sign is negative, no sign of DVT Wound: Sternum is stable and well-healed, right leg vein harvest sites healing well Patient has a stent less than half centimeter subcutaneous nodule left lateral breast, he is discussed this with Dr. Dwyane Dee and has a follow-up appointment for evaluation it does not appear related to his previous surgery  or surgical incision  Diagnostic Studies & Laboratory data:     Recent Radiology Findings:   Dg Chest 2 View  Result Date: 01/04/2016 CLINICAL DATA:  Status post CABG 11/29/2015.  Follow-up examination. EXAM: CHEST  2 VIEW COMPARISON:  PA and lateral chest 12/05/2015 and 10/12/2013. FINDINGS: 7 intact median sternotomy wires are unchanged in appearance. Heart size is normal. Small bilateral pleural effusions seen on most recent examination have resolved. There is no pulmonary edema. Aortic atherosclerosis is noted. IMPRESSION: Resolve small bilateral pleural effusions.  No acute disease. Atherosclerosis. Electronically  Signed   By: Inge Rise M.D.   On: 01/04/2016 11:21      Recent Lab Findings: Lab Results  Component Value Date   WBC 9.2 12/27/2015   HGB 11.4 (L) 12/27/2015   HCT 35.9 (L) 12/27/2015   PLT 206 12/27/2015   GLUCOSE 82 12/27/2015   CHOL 117 10/31/2015   TRIG 98.0 10/31/2015   HDL 44.60 10/31/2015   LDLCALC 53 10/31/2015   ALT 10 12/12/2015   AST 15 12/12/2015   NA 142 12/27/2015   K 4.5 12/27/2015   CL 109 12/27/2015   CREATININE 1.17 (H) 12/27/2015   BUN 14 12/27/2015   CO2 25 12/27/2015   TSH 7.31 (H) 12/12/2015   INR 1.0 12/27/2015   HGBA1C 7.0 (H) 11/28/2015      Assessment / Plan:     Patient doing well following coronary artery bypass grafting Have TEE cardioversion tomorrow Followed by Dr. Dwyane Dee for diabetes, hypothyroid, and is seen him next week for evaluation of potential left breast mass. I've encouraged the patient to enroll in cardiac rehabilitation when cleared by cardiology following his cardioversion    [name] MD      [address] [place] [address] Office [phone]   Beeper [phone]  01/04/2016 12:05 PM

## 2016-01-05 ENCOUNTER — Encounter (HOSPITAL_COMMUNITY)
Admission: RE | Disposition: A | Discharge status: Home / Self Care | Payer: Self-pay | Source: Ambulatory Visit | Attending: Cardiovascular Disease

## 2016-01-05 ENCOUNTER — Ambulatory Visit (HOSPITAL_COMMUNITY): Payer: Medicare Other | Admitting: Anesthesiology

## 2016-01-05 ENCOUNTER — Encounter (HOSPITAL_COMMUNITY): Payer: Self-pay | Admitting: Anesthesiology

## 2016-01-05 ENCOUNTER — Ambulatory Visit (HOSPITAL_COMMUNITY)
Admission: RE | Admit: 2016-01-05 | Discharge: 2016-01-05 | Disposition: A | Discharge status: Home / Self Care | Payer: Medicare Other | Source: Ambulatory Visit | Attending: Cardiovascular Disease | Admitting: Cardiovascular Disease

## 2016-01-05 ENCOUNTER — Ambulatory Visit (HOSPITAL_BASED_OUTPATIENT_CLINIC_OR_DEPARTMENT_OTHER): Payer: Medicare Other

## 2016-01-05 DIAGNOSIS — E785 Hyperlipidemia, unspecified: Secondary | ICD-10-CM | POA: Insufficient documentation

## 2016-01-05 DIAGNOSIS — Z8546 Personal history of malignant neoplasm of prostate: Secondary | ICD-10-CM | POA: Insufficient documentation

## 2016-01-05 DIAGNOSIS — E039 Hypothyroidism, unspecified: Secondary | ICD-10-CM | POA: Insufficient documentation

## 2016-01-05 DIAGNOSIS — E1122 Type 2 diabetes mellitus with diabetic chronic kidney disease: Secondary | ICD-10-CM | POA: Insufficient documentation

## 2016-01-05 DIAGNOSIS — R011 Cardiac murmur, unspecified: Secondary | ICD-10-CM | POA: Diagnosis not present

## 2016-01-05 DIAGNOSIS — I9789 Other postprocedural complications and disorders of the circulatory system, not elsewhere classified: Secondary | ICD-10-CM | POA: Diagnosis not present

## 2016-01-05 DIAGNOSIS — I4891 Unspecified atrial fibrillation: Secondary | ICD-10-CM

## 2016-01-05 DIAGNOSIS — I739 Peripheral vascular disease, unspecified: Secondary | ICD-10-CM | POA: Insufficient documentation

## 2016-01-05 DIAGNOSIS — Z85828 Personal history of other malignant neoplasm of skin: Secondary | ICD-10-CM | POA: Insufficient documentation

## 2016-01-05 DIAGNOSIS — K219 Gastro-esophageal reflux disease without esophagitis: Secondary | ICD-10-CM | POA: Diagnosis not present

## 2016-01-05 DIAGNOSIS — G4730 Sleep apnea, unspecified: Secondary | ICD-10-CM | POA: Diagnosis not present

## 2016-01-05 DIAGNOSIS — N183 Chronic kidney disease, stage 3 (moderate): Secondary | ICD-10-CM | POA: Insufficient documentation

## 2016-01-05 DIAGNOSIS — Z8249 Family history of ischemic heart disease and other diseases of the circulatory system: Secondary | ICD-10-CM | POA: Insufficient documentation

## 2016-01-05 DIAGNOSIS — Z79899 Other long term (current) drug therapy: Secondary | ICD-10-CM | POA: Insufficient documentation

## 2016-01-05 DIAGNOSIS — I481 Persistent atrial fibrillation: Secondary | ICD-10-CM

## 2016-01-05 DIAGNOSIS — Z823 Family history of stroke: Secondary | ICD-10-CM | POA: Insufficient documentation

## 2016-01-05 DIAGNOSIS — I340 Nonrheumatic mitral (valve) insufficiency: Secondary | ICD-10-CM | POA: Diagnosis not present

## 2016-01-05 DIAGNOSIS — M549 Dorsalgia, unspecified: Secondary | ICD-10-CM | POA: Insufficient documentation

## 2016-01-05 DIAGNOSIS — K5790 Diverticulosis of intestine, part unspecified, without perforation or abscess without bleeding: Secondary | ICD-10-CM | POA: Insufficient documentation

## 2016-01-05 DIAGNOSIS — D649 Anemia, unspecified: Secondary | ICD-10-CM | POA: Insufficient documentation

## 2016-01-05 DIAGNOSIS — G8929 Other chronic pain: Secondary | ICD-10-CM | POA: Insufficient documentation

## 2016-01-05 DIAGNOSIS — I4819 Other persistent atrial fibrillation: Secondary | ICD-10-CM

## 2016-01-05 DIAGNOSIS — J45909 Unspecified asthma, uncomplicated: Secondary | ICD-10-CM | POA: Diagnosis not present

## 2016-01-05 DIAGNOSIS — G4733 Obstructive sleep apnea (adult) (pediatric): Secondary | ICD-10-CM | POA: Insufficient documentation

## 2016-01-05 DIAGNOSIS — M1990 Unspecified osteoarthritis, unspecified site: Secondary | ICD-10-CM | POA: Diagnosis not present

## 2016-01-05 DIAGNOSIS — I080 Rheumatic disorders of both mitral and aortic valves: Secondary | ICD-10-CM | POA: Insufficient documentation

## 2016-01-05 DIAGNOSIS — Z8052 Family history of malignant neoplasm of bladder: Secondary | ICD-10-CM | POA: Insufficient documentation

## 2016-01-05 DIAGNOSIS — Z7982 Long term (current) use of aspirin: Secondary | ICD-10-CM | POA: Insufficient documentation

## 2016-01-05 DIAGNOSIS — I2510 Atherosclerotic heart disease of native coronary artery without angina pectoris: Secondary | ICD-10-CM | POA: Insufficient documentation

## 2016-01-05 DIAGNOSIS — Z888 Allergy status to other drugs, medicaments and biological substances status: Secondary | ICD-10-CM | POA: Insufficient documentation

## 2016-01-05 DIAGNOSIS — I4510 Unspecified right bundle-branch block: Secondary | ICD-10-CM | POA: Insufficient documentation

## 2016-01-05 DIAGNOSIS — I440 Atrioventricular block, first degree: Secondary | ICD-10-CM | POA: Insufficient documentation

## 2016-01-05 DIAGNOSIS — Z7901 Long term (current) use of anticoagulants: Secondary | ICD-10-CM | POA: Insufficient documentation

## 2016-01-05 DIAGNOSIS — I509 Heart failure, unspecified: Secondary | ICD-10-CM | POA: Diagnosis not present

## 2016-01-05 DIAGNOSIS — Z880 Allergy status to penicillin: Secondary | ICD-10-CM | POA: Insufficient documentation

## 2016-01-05 DIAGNOSIS — Z951 Presence of aortocoronary bypass graft: Secondary | ICD-10-CM | POA: Insufficient documentation

## 2016-01-05 DIAGNOSIS — Z86010 Personal history of colonic polyps: Secondary | ICD-10-CM | POA: Insufficient documentation

## 2016-01-05 DIAGNOSIS — Z87442 Personal history of urinary calculi: Secondary | ICD-10-CM | POA: Insufficient documentation

## 2016-01-05 DIAGNOSIS — I252 Old myocardial infarction: Secondary | ICD-10-CM | POA: Insufficient documentation

## 2016-01-05 DIAGNOSIS — I130 Hypertensive heart and chronic kidney disease with heart failure and stage 1 through stage 4 chronic kidney disease, or unspecified chronic kidney disease: Secondary | ICD-10-CM | POA: Diagnosis not present

## 2016-01-05 DIAGNOSIS — I4892 Unspecified atrial flutter: Secondary | ICD-10-CM | POA: Diagnosis not present

## 2016-01-05 DIAGNOSIS — Z794 Long term (current) use of insulin: Secondary | ICD-10-CM | POA: Insufficient documentation

## 2016-01-05 DIAGNOSIS — E1121 Type 2 diabetes mellitus with diabetic nephropathy: Secondary | ICD-10-CM | POA: Diagnosis not present

## 2016-01-05 DIAGNOSIS — F0390 Unspecified dementia without behavioral disturbance: Secondary | ICD-10-CM | POA: Diagnosis not present

## 2016-01-05 DIAGNOSIS — Z981 Arthrodesis status: Secondary | ICD-10-CM | POA: Insufficient documentation

## 2016-01-05 DIAGNOSIS — Z86711 Personal history of pulmonary embolism: Secondary | ICD-10-CM | POA: Insufficient documentation

## 2016-01-05 DIAGNOSIS — N320 Bladder-neck obstruction: Secondary | ICD-10-CM | POA: Insufficient documentation

## 2016-01-05 DIAGNOSIS — M109 Gout, unspecified: Secondary | ICD-10-CM | POA: Diagnosis not present

## 2016-01-05 DIAGNOSIS — Z9841 Cataract extraction status, right eye: Secondary | ICD-10-CM | POA: Insufficient documentation

## 2016-01-05 HISTORY — PX: TEE WITHOUT CARDIOVERSION: SHX5443

## 2016-01-05 HISTORY — PX: CARDIOVERSION: SHX1299

## 2016-01-05 LAB — GLUCOSE, CAPILLARY: GLUCOSE-CAPILLARY: 150 mg/dL — AB (ref 65–99)

## 2016-01-05 SURGERY — CARDIOVERSION
Anesthesia: General

## 2016-01-05 MED ORDER — PROPOFOL 500 MG/50ML IV EMUL
INTRAVENOUS | Status: DC | PRN
  Administered 2016-01-05: 100 ug/kg/min via INTRAVENOUS

## 2016-01-05 MED ORDER — LIDOCAINE 2% (20 MG/ML) 5 ML SYRINGE
INTRAMUSCULAR | Status: DC | PRN
  Administered 2016-01-05: 40 mg via INTRAVENOUS

## 2016-01-05 MED ORDER — SODIUM CHLORIDE 0.9 % IV SOLN
INTRAVENOUS | Status: DC
  Administered 2016-01-05 (×2): via INTRAVENOUS

## 2016-01-05 MED ORDER — PROPOFOL 10 MG/ML IV BOLUS
INTRAVENOUS | Status: DC | PRN
  Administered 2016-01-05 (×2): 20 mg via INTRAVENOUS

## 2016-01-05 MED ORDER — LACTATED RINGERS IV SOLN: INTRAVENOUS | Status: DC | PRN

## 2016-01-05 NOTE — Interval H&P Note (Signed)
History and Physical Interval Note:  01/05/2016 8:17 AM  Barry Odor Copland Sr.  has presented today for surgery, with the diagnosis of aflutter  The various methods of treatment have been discussed with the patient and family. After consideration of risks, benefits and other options for treatment, the patient has consented to  Procedure(s): CARDIOVERSION (N/A) TRANSESOPHAGEAL ECHOCARDIOGRAM (TEE) (N/A) as a surgical intervention .  The patient's history has been reviewed, patient examined, no change in status, stable for surgery.  I have reviewed the patient's chart and labs.  Questions were answered to the patient's satisfaction.     Ronne Savoia

## 2016-01-05 NOTE — Anesthesia Postprocedure Evaluation (Signed)
Anesthesia Post Note  Patient: Barry Garcilazo Kathan Sr.  Procedure(s) Performed: Procedure(s) (LRB): CARDIOVERSION (N/A) TRANSESOPHAGEAL ECHOCARDIOGRAM (TEE) (N/A)  Patient location during evaluation: PACU Anesthesia Type: MAC Level of consciousness: awake and alert Pain management: pain level controlled Vital Signs Assessment: post-procedure vital signs reviewed and stable Respiratory status: spontaneous breathing Cardiovascular status: stable Anesthetic complications: no    Last Vitals:  Vitals:   01/05/16 1040 01/05/16 1050  BP: (!) 146/62 121/77  Pulse:    Resp:    Temp:      Last Pain:  Vitals:   01/05/16 1050  TempSrc:   PainSc: [place]

## 2016-01-05 NOTE — Progress Notes (Signed)
  Echocardiogram Echocardiogram Transesophageal has been performed.  Barry Mcdaniel 01/05/2016, 10:39 AM

## 2016-01-05 NOTE — Progress Notes (Signed)
1045 Dr. Recardo Evangelist at bedside.  Reviewed 12 lead EKG results

## 2016-01-05 NOTE — Transfer of Care (Signed)
Immediate Anesthesia Transfer of Care Note  Patient: Barry Fleeger Vassey Sr.  Procedure(s) Performed: Procedure(s): CARDIOVERSION (N/A) TRANSESOPHAGEAL ECHOCARDIOGRAM (TEE) (N/A)  Patient Location: PACU  Anesthesia Type:MAC  Level of Consciousness: awake, alert  and oriented  Airway & Oxygen Therapy: Patient Spontanous Breathing and Patient connected to nasal cannula oxygen  Post-op Assessment: Report given to RN, Post -op Vital signs reviewed and stable and Patient moving all extremities X 4  Post vital signs: Reviewed and stable  Last Vitals:  Vitals:   01/05/16 0819  BP: (!) 145/71  Pulse: 75  Resp: (!) 28  Temp: 36.7 C    Last Pain:  Vitals:   01/05/16 0819  TempSrc: Oral  PainSc: 6       Patients Stated Pain Goal: 6 ([id number])  Complications: No apparent anesthesia complications

## 2016-01-05 NOTE — Op Note (Signed)
Procedure: Electrical Cardioversion Indications:  Atrial Fibrillation  Procedure Details:  Consent: Risks of procedure as well as the alternatives and risks of each were explained to the (patient/caregiver).  Consent for procedure obtained.  Time Out: Verified patient identification, verified procedure, site/side was marked, verified correct patient position, special equipment/implants available, medications/allergies/relevent history reviewed, required imaging and test results available.  Performed  Patient placed on cardiac monitor, pulse oximetry, supplemental oxygen as necessary.  Sedation given: IV propofol Pacer pads placed anterior and posterior chest.  Cardioverted 1 time(s).  Cardioversion with synchronized biphasic 120J shock.  Evaluation: Findings: Post procedure EKG shows: NSR with 1st degree AV block Complications: None Patient did tolerate procedure well.  Time Spent Directly with the Patient:  30 minutes   Carrigan Delafuente 01/05/2016, 10:24 AM

## 2016-01-05 NOTE — CV Procedure (Signed)
INDICATIONS: atrial fibillation  PROCEDURE:   Informed consent was obtained prior to the procedure. The risks, benefits and alternatives for the procedure were discussed and the patient comprehended these risks.  Risks include, but are not limited to, cough, sore throat, vomiting, nausea, somnolence, esophageal and stomach trauma or perforation, bleeding, low blood pressure, aspiration, pneumonia, infection, trauma to the teeth and death.    After a procedural time-out, the oropharynx was anesthetized with 20% benzocaine spray.   During this procedure the patient was administered IV propofol for moderate conscious sedation.    The transesophageal probe was inserted in the esophagus and stomach without difficulty and multiple views were obtained.  The patient was kept under observation until the patient left the procedure room.  The patient left the procedure room in stable condition.   Agitated microbubble saline contrast was not administered.  COMPLICATIONS:    There were no immediate complications.  FINDINGS:  No LA thrombus. Dilated left atrium. Normal LV function. Moderate aortic valve stenosis. Mild mitral insufficiency. Moderate descending aortic atherosclerosis.  RECOMMENDATIONS:     Proceed with cardioversion.  Time Spent Directly with the Patient:  30 minutes   Surya Folden 01/05/2016, 10:22 AM

## 2016-01-05 NOTE — Discharge Instructions (Signed)
TEE ° °YOU HAD AN CARDIAC PROCEDURE TODAY: Refer to the procedure report and other information in the discharge instructions given to you for any specific questions about what was found during the examination. If this information does not answer your questions, please call [facility] office at [phone] to clarify.  ° °DIET: Your first meal following the procedure should be a light meal and then it is ok to progress to your normal diet. A half-sandwich or bowl of soup is an example of a good first meal. Heavy or fried foods are harder to digest and may make you feel nauseous or bloated. Drink plenty of fluids but you should avoid alcoholic beverages for 24 hours. If you had a esophageal dilation, please see attached instructions for diet.  ° °ACTIVITY: Your care partner should take you home directly after the procedure. You should plan to take it easy, moving slowly for the rest of the day. You can resume normal activity the day after the procedure however YOU SHOULD NOT DRIVE, use power tools, machinery or perform tasks that involve climbing or major physical exertion for 24 hours (because of the sedation medicines used during the test).  ° °SYMPTOMS TO REPORT IMMEDIATELY: °A cardiologist can be reached at any hour. Please call [phone] for any of the following symptoms:  °Vomiting of blood or coffee ground material  °New, significant abdominal pain  °New, significant chest pain or pain under the shoulder blades  °Painful or persistently difficult swallowing  °New shortness of breath  °Black, tarry-looking or red, bloody stools ° °FOLLOW UP:  °Please also call with any specific questions about appointments or follow up tests. ° °Electrical Cardioversion, Care After °Refer to this sheet in the next few weeks. These instructions provide you with information on caring for yourself after your procedure. Your health care provider may also give you more specific instructions. Your treatment has been planned  according to current medical practices, but problems sometimes occur. Call your health care provider if you have any problems or questions after your procedure. °WHAT TO EXPECT AFTER THE PROCEDURE °After your procedure, it is typical to have the following sensations: °· Some redness on the skin where the shocks were delivered. If this is tender, a sunburn lotion or hydrocortisone cream may help. °· Possible return of an abnormal heart rhythm within hours or days after the procedure. °HOME CARE INSTRUCTIONS °· Take medicines only as directed by your health care provider. Be sure you understand how and when to take your medicine. °· Learn how to feel your pulse and check it often. °· Limit your activity for 48 hours after the procedure or as directed by your health care provider. °· Avoid or minimize caffeine and other stimulants as directed by your health care provider. °SEEK MEDICAL CARE IF: °· You feel like your heart is beating too fast or your pulse is not regular. °· You have any questions about your medicines. °· You have bleeding that will not stop. °SEEK IMMEDIATE MEDICAL CARE IF: °· You are dizzy or feel faint. °· It is hard to breathe or you feel short of breath. °· There is a change in discomfort in your chest. °· Your speech is slurred or you have trouble moving an arm or leg on one side of your body. °· You get a serious muscle cramp that does not go away. °· Your fingers or toes turn cold or blue. °  °This information is not intended to replace advice given to you by   your health care provider. Make sure you discuss any questions you have with your health care provider. °  °Document Released: 12/16/2012 Document Revised: 03/18/2014 Document Reviewed: 12/16/2012 °Elsevier Interactive Patient Education ©2016 [employer] ° ° °

## 2016-01-05 NOTE — Progress Notes (Signed)
EKG CRITICAL VALUE     12 lead EKG performed.  Critical value noted.  Lattie Haw, RN notified.   Yehuda Mao, CCT 01/05/2016 10:52 AM

## 2016-01-05 NOTE — H&P (View-Only) (Signed)
Cardiology Office Note    Date:  12/27/2015   ID:  Barry Officer Sr., DOB 1932/06/21, MRN [id number]  PCP:  [name], MD  Cardiologist:  Dr. [name]   CC: follow up- atrial flutter  History of Present Illness:  Barry DEFREES Sr. is a 82 y.o. male with a history of HLD, DMT2, HTN, OSA, PVD, trifascicular block and recently diagnosed CAD s/p CABG x5V with post op atrial flutter who presents to clinic for follow up.   Recently admitted from 9/16-9/28/17. He presented with chest pain and ruled in for NSTEMI (pk trop 1.62). Cath showed critical 3 vessel and left main CAD and he was referred for CABG. Echo results showed mild AS, mild MR and LVEF 45-50%. He underwent a CABG x 5 on 11/29/2015. Post op course was c/b  atrial flutter and volume overload requiring diuresis. Given AV dissociation and history of "trifascicular block," amiodarone and other AV nodal blocking agents were avoided. He was rate-controlled due to intrinsic AV block. Cardiology recommend anticoagulation with Eliquis 5mg  BID and outpatient cardioversion attempt if he does not spontaneously convert in 3 weeks.   I saw him in clinic on 12/14/15. He was doing well but remained in atrial flutter. Plan is for DCCV after 3 weeks of uninterrupted Eliquis.   Today he presents to clinic for follow up. He has been feeling tired and weak.  He hasn't have very good energy. He has not wanted to do much. He has had a little hurting in his right side of the chest. Currently having it now. Not worse with exertion. He has a little hard bump in his left breast. No SOB. No LE edema, orthopnea or PND. No dizziness or syncope.    Past Medical History:  Diagnosis Date  . Anemia    hx of year ago no problems now per pt   . Arthritis    generalized   . Asthma   . Atypical chest pain   . Bladder neck contracture   . Chronic back pain   . Complication of anesthesia    POST OP HYPOTENSION  . Dementia    per wife pt had neurological evaluation  .  Diverticulosis of colon   . First degree heart block   . GERD (gastroesophageal reflux disease)   . Heart murmur   . History of basal cell carcinoma excision    forehead and x6 area forearms  . History of Bell's palsy    x2  1998 &  2004 left side --  residual slight left eye droop and vace  . History of chronic bronchitis   . History of colon polyps   . History of gout   . History of hypertension   . History of MI (myocardial infarction)    08/ 2010  secondary acute renal failure/ dehydration--  medical management  . History of pulmonary embolus (PE)    2010  . Hypogonadism male   . Hypotension   . Hypothyroidism   . OSA (obstructive sleep apnea)    severe per study 2007/  non-compliant cpap  . Peripheral vascular disease ([place])   . Prostate carcinoma [facility]) urologist-  dr Gaynelle Arabian   DX 2007  Gleason 6  and recurrent 2012  s/p  cryoablation of prostate both times  . RBBB (right bundle branch block)   . Recovering alcoholic in remission ([place])   . Renal calculus, bilateral   . Type 2 diabetes mellitus ([place])     Past Surgical History:  Procedure Laterality Date  . CARDIAC CATHETERIZATION N/A 11/27/2015   Procedure: Left Heart Cath and Coronary Angiography;  Surgeon: Peter M [name], MD;  Location: [place] [facility];  Service: Cardiovascular;  Laterality: N/A;  . CARDIOVASCULAR STRESS TEST  05-19-2012  dr Tressia Miners turner   normal perfusionstudy/  no ischemia/  attenuation artifact inferoapex region of myocardium/  note gate secondary to rhythm irregularity  . CATARACT EXTRACTION W/ INTRAOCULAR LENS IMPLANT Right 2014  . COLONOSCOPY W/ POLYPECTOMY  last one 08-10-2013  . CORONARY ARTERY BYPASS GRAFT N/A 11/29/2015   Procedure: CORONARY ARTERY BYPASS GRAFTING (CABG), ON PUMP, TIMES FIVE, USING LEFT INTERNAL MAMMARY ARTERY, RIGHT GREATER SAPHENOUS VEIN HARVESTED ENDOSCOPICALLY;  Surgeon: [name], MD;  Location: [place];  Service: Open Heart Surgery;  Laterality: N/A;  LIMA-LAD;  SEQ SVG-OM1-OM2;SVG-DIAG; SVG-PL  . CYSTOSCOPY WITH URETHRAL DILATATION N/A 01/14/2014   Procedure: URETHRAL MEATAL DILATATION;  Surgeon: Ailene Rud, MD;  Location: [facility];  Service: Urology;  Laterality: N/A;  . EXCISION BENIGN SKIN LESION OF BACK  10/ 2011  . HIATAL HERNIA REPAIR  1983  . INGUINAL HERNIA REPAIR Right 10-14-2001  . INTRAOPERATIVE TRANSESOPHAGEAL ECHOCARDIOGRAM N/A 11/29/2015   Procedure: INTRAOPERATIVE TRANSESOPHAGEAL ECHOCARDIOGRAM;  Surgeon: [name], MD;  Location: [place];  Service: Open Heart Surgery;  Laterality: N/A;  . KNEE ARTHROSCOPY W/ MENISCECTOMY Right 01-25-2003   and chondroplasty  . [place]  . PROSTATE CRYOABLATION  09-17-2010  &  04-12-2005  . REPAIR RIGHT ARM TENDON INJURY  1975  . SHOULDER OPEN ROTATOR CUFF REPAIR  03/20/2011   Procedure: ROTATOR CUFF REPAIR SHOULDER OPEN;  Surgeon: Tobi Bastos;  Location: [facility];  Service: Orthopedics;  Laterality: Right;  . TONSILLECTOMY  as child  . TRANSTHORACIC ECHOCARDIOGRAM  01-11-2013  dr [name]   moderate LVH/  grade I diastolic dysfunction/ ef [id number]  mild AV stenosis /  mild MR/  mild LAE  . TRANSURETHRAL RESECTION OF BLADDER TUMOR N/A 01/14/2014   Procedure: TRANSURETHRAL INCISION OF BLADDER NECK CONTRACTURE;  Surgeon: Ailene Rud, MD;  Location: [facility];  Service: Urology;  Laterality: N/A;  . Vantus Implant     Hormones for Prostate  . VENTRAL HERNIA REPAIR  1998    Current Medications: Outpatient Medications Prior to Visit  Medication Sig Dispense Refill  . acetaminophen (TYLENOL) 500 MG tablet Take 500 mg by mouth every 8 (eight) hours as needed for mild pain, moderate pain or headache.     . albuterol (PROVENTIL HFA;VENTOLIN HFA) 108 (90 Base) MCG/ACT inhaler Inhale 2 puffs into the lungs every 6 (six) hours. 1 Inhaler 1  . apixaban (ELIQUIS) 5 MG TABS tablet Take 1 tablet (5 mg total) by mouth 2 (two) times daily.  60 tablet 1  . aspirin EC 81 MG tablet Take 81 mg by mouth daily.     . budesonide-formoterol (SYMBICORT) 160-4.5 MCG/ACT inhaler Inhale 2 puffs into the lungs 2 (two) times daily. 3 Inhaler 3  . CRESTOR 10 MG tablet TAKE ONE TABLET BY MOUTH ONCE DAILY 90 tablet 1  . finasteride (PROSCAR) 5 MG tablet Take 5 mg by mouth every morning.     . gabapentin (NEURONTIN) 300 MG capsule TAKE ONE CAPSULE BY MOUTH 4 TIMES DAILY 120 capsule 3  . glucose blood (ONE TOUCH ULTRA TEST) test strip Use as instructed to check blood sugar 3 times per day dx code E11.65 100 each 5  .  lisinopril (PRINIVIL,ZESTRIL) 40 MG tablet Take 1 tablet (40 mg total) by mouth daily. 30 tablet 1  . menthol-thymol (ABSORBINE JR) LIQD Apply 1 application topically as needed. For pain.    . nitroGLYCERIN (NITROSTAT) 0.4 MG SL tablet Place 1 tablet (0.4 mg total) under the tongue every 5 (five) minutes as needed for chest pain. 25 tablet 3  . Polyethyl Glycol-Propyl Glycol (SYSTANE) 0.4-0.3 % SOLN Apply 1-2 drops to eye 3 (three) times daily as needed (for dry eyes).    . polyethylene glycol (MIRALAX / GLYCOLAX) packet Take 17 g by mouth daily as needed for mild constipation, moderate constipation or severe constipation. 1 capful daily as needed    . traMADol (ULTRAM) 50 MG tablet Take 1 tablet (50 mg total) by mouth every 6 (six) hours. 30 tablet 0  . triamcinolone (NASACORT AQ) 55 MCG/ACT AERO nasal inhaler Place 1-2 sprays into the nose daily.     . vitamin B-12 (CYANOCOBALAMIN) 1000 MCG tablet Take 1,000 mcg by mouth daily.    Marland Kitchen HUMALOG MIX 75/25 KWIKPEN (75-25) 100 UNIT/ML Kwikpen INJECT 20 UNITS SUBCUTANEOUSLY IN THE MORNING AND 30 UNITS SUBCUTANEOUSLY IN THE EVENING DAILY (Patient taking differently: Inject 20 units into the skin in the morning then 14 units in the afternoon then 16 units at bedtime) 45 mL 0  . levothyroxine (SYNTHROID, LEVOTHROID) 112 MCG tablet TAKE ONE TABLET BY MOUTH ONCE DAILY (Patient taking differently: Take  112 mcg by mouth once a day) 90 tablet 0  . metFORMIN (GLUCOPHAGE-XR) 500 MG 24 hr tablet TAKE THREE TABLETS BY MOUTH ONCE DAILY WITH SUPPER (Patient taking differently: Take 1,000 mg by mouth in the evening) 90 tablet 5  . QUEtiapine (SEROQUEL) 25 MG tablet Take 1 tablet (25 mg total) by mouth at bedtime. 30 tablet 1  . TOUJEO SOLOSTAR 300 UNIT/ML SOPN INJECT 42 UNITS SUBCUTANEOUSLY EVERY MORNING (Patient taking differently: Inject 32 units into the skin in the morning) 6 mL 3   No facility-administered medications prior to visit.      Allergies:   Penicillins and Prednisone   Social History   Social History  . Marital status: Married    Spouse name: N/A  . Number of children: N/A  . Years of education: N/A   Occupational History  . Retired      Personal assistant    Social History Main Topics  . Smoking status: Never Smoker  . Smokeless tobacco: Never Used  . Alcohol use No     Comment: RECOVERING ALCOHOLIC none since [id number]   . Drug use: No  . Sexual activity: Not Currently   Other Topics Concern  . None   Social History Narrative  . None     Family History:  The patient's family history includes Cancer in his brother; Heart disease in his mother; Stroke in his father.     ROS:   Please see the history of present illness.    ROS All other systems reviewed and are negative.   PHYSICAL EXAM:   VS:  BP 138/70   Pulse 76   Ht 5\' 10"  (1.778 m)   Wt 184 lb (83.5 kg)   BMI 26.40 kg/m    GEN: Well nourished, well developed, in no acute distress  HEENT: normal  Neck: no JVD, carotid bruits, or masses Cardiac: irreg irreg; no murmurs, rubs, or gallops,no edema  Respiratory:  rhoncorous on auscultation bilaterally, normal work of breathing GI: soft, nontender, nondistended, + BS MS: no deformity or atrophy  Skin: warm and dry, no rash Neuro:  Alert and Oriented x 3, Strength and sensation are intact Psych: euthymic mood, full affect  Wt Readings from Last 3 Encounters:    12/27/15 184 lb (83.5 kg)  12/15/15 185 lb 3.2 oz (84 kg)  12/14/15 186 lb 12.8 oz (84.7 kg)      Studies/Labs Reviewed:   EKG:  EKG is ordered today.  The ekg ordered today demonstrates atrial flutter with var block LAD, RBBB  Recent Labs: 11/25/2015: B Natriuretic Peptide 1,139.9 11/30/2015: Magnesium 2.2 12/05/2015: Hemoglobin 10.7; Platelets 202 12/12/2015: ALT 10; BUN 9; Creatinine, Ser 1.14; Potassium 4.7; Sodium 142; TSH 7.31   Lipid Panel    Component Value Date/Time   CHOL 117 10/31/2015 1340   TRIG 98.0 10/31/2015 1340   HDL 44.60 10/31/2015 1340   CHOLHDL 3 10/31/2015 1340   VLDL 19.6 10/31/2015 1340   LDLCALC 53 10/31/2015 1340    Additional studies/ records that were reviewed today include:  Left Heart Cath and Coronary Angiography by Dr. [name] on 11/27/2015:  Conclusion     Ost LM to LM lesion, 80 %stenosed.  Prox LAD lesion, 80 %stenosed.  Mid LAD lesion, 90 %stenosed.  Dist LAD lesion, 95 %stenosed.  Ost 1st Diag to 1st Diag lesion, 100 %stenosed.  Ost Ramus lesion, 90 %stenosed.  Ost Cx to Prox Cx lesion, 90 %stenosed.  Ost 1st Mrg to 1st Mrg lesion, 80 %stenosed.  Prox Cx to Mid Cx lesion, 100 %stenosed.  Prox RCA lesion, 95 %stenosed.  Dist RCA lesion, 90 %stenosed.  1. Critical 3 vessel and left main CAD 2. Mildly elevated LV EDP 3. No significant AV gradient by pullback.    Coronary artery bypass grafting x5 with left internal mammary to the left anterior descending coronary artery reverse saphenous vein graft to the diagonal coronary artery, sequential reverse saphenous vein graft to the first and second obtuse marginal reverse saphenous vein graft to the posterior lateral branch of the right coronary artery with right thigh and calf greater saphenous vein harvesting endoscopically by Dr. Servando Snare on 11/29/2015.   2D ECHO: 11/28/2015 LV EF: 45% -   50% Study Conclusions - Left ventricle: The cavity size was normal.  There was severe   concentric hypertrophy. Systolic function was mildly reduced. The   estimated ejection fraction was in the range of 45% to 50%.   Hypokinesis of the apical, mid-apical anteroseptal and   inferoseptal myocardium, and apical inferolateral, and inferior   myocardium. Doppler parameters are consistent with a reversible   restrictive pattern, indicative of decreased left ventricular   diastolic compliance and/or increased left atrial pressure (grade   3 diastolic dysfunction). - Aortic valve: Trileaflet; moderately thickened, moderately   calcified leaflets. Valve mobility was restricted. There was mild   stenosis. There was no regurgitation. Valve area (VTI): 0.88   cm^2. Valve area (Vmax): 0.96 cm^2. Valve area (Vmean): 0.86   cm^2. - Mitral valve: Calcified annulus. Transvalvular velocity was   within the normal range. There was no evidence for stenosis.   There was mild regurgitation. - Left atrium: The atrium was severely dilated. - Right ventricle: The cavity size was normal. Wall thickness was   normal. Systolic function was normal. - Atrial septum: A patent foramen ovale cannot be excluded. - Tricuspid valve: There was mild regurgitation. Impressions: - Aortic valve appears heavily calcified and thickend. There   appears to be mild aortic stenosis by peak velocity and mean   gradients. However, given  reduced systolic function, cannot rule   out more severe valvular heart disease or &quot;low flow, low   gradient&quot; aortic stenosis.   ASSESSMENT & PLAN:   CAD: LM 3VD seen by s/p CABG x5V. Continue ASA and statin.   Post op atrial flutter: on no AV nodal blocking agents. Continue Eliquis 5mg  BID for CHADSVASC of 6 (CHF, HTN, age, DM, CAD). Still in atrial flutter. Will plan for DCCV now since he had been on 3 consecutive weeks of uninterrupted Eliquis. Will do this Friday Am with Dr. Meda Coffee. I hope his general malaise will improve with restoration of NSR.  Hopefully this will stick, he has a severely dilated left atrium. If he doesn't hold NSR, we can have him see EP for possible flutter ablation.  Bifascicular block, Mobitz type I: significant conduction abnormality seen on EKG, avoiding Beta blockers. No syncope.  Chronic kidney disease stage III: creat stable at 1.14  Hyperlipidemia: LDL 53. Continue Crestor 10mg  daily.   Diabetes with nephropathy: continue current regimen and follow up with Dr. Dwyane Dee  HTN: BP well controlled on current regimen   Medication Adjustments/Labs and Tests Ordered: Current medicines are reviewed at length with the patient today.  Concerns regarding medicines are outlined above.  Medication changes, Labs and Tests ordered today are listed in the Patient Instructions below. Patient Instructions  Medication Instructions:  Your physician recommends that you continue on your current medications as directed. Please refer to the Current Medication list given to you today.   Labwork: TODAY:  BMET, CBC W/DIFF, & PT/INR  Testing/Procedures: Your physician has recommended that you have a Cardioversion (DCCV). Electrical Cardioversion uses a jolt of electricity to your heart either through paddles or wired patches attached to your chest. This is a controlled, usually prescheduled, procedure. Defibrillation is done under light anesthesia in the hospital, and you usually go home the day of the procedure. This is done to get your heart back into a normal rhythm. You are not awake for the procedure. Please see the instruction sheet given to you today.   Follow-Up: Your physician recommends that you schedule a follow-up appointment in: [place], PA-C   Any Other Special Instructions Will Be Listed Below (If Applicable).   Electrical Cardioversion Electrical cardioversion is the delivery of a jolt of electricity to change the rhythm of the heart. Sticky patches or metal paddles are placed on the  chest to deliver the electricity from a device. This is done to restore a normal rhythm. A rhythm that is too fast or not regular keeps the heart from pumping well. Electrical cardioversion is done in an emergency if:   There is low or no blood pressure as a result of the heart rhythm.   Normal rhythm must be restored as fast as possible to protect the brain and heart from further damage.   It may save a life. Cardioversion may be done for heart rhythms that are not immediately life threatening, such as atrial fibrillation or flutter, in which:   The heart is beating too fast or is not regular.   Medicine to change the rhythm has not worked.   It is safe to wait in order to allow time for preparation.  Symptoms of the abnormal rhythm are bothersome.  The risk of stroke and other serious problems can be reduced. LET [facility] CARE PROVIDER KNOW ABOUT:   Any allergies you have.  All medicines you are taking, including vitamins, herbs, eye drops, creams, and  over-the-counter medicines.  Previous problems you or members of your family have had with the use of anesthetics.   Any blood disorders you have.   Previous surgeries you have had.   Medical conditions you have. RISKS AND COMPLICATIONS  Generally, this is a safe procedure. However, problems can occur and include:   Breathing problems related to the anesthetic used.  A blood clot that breaks free and travels to other parts of your body. This could cause a stroke or other problems. The risk of this is lowered by use of blood-thinning medicine (anticoagulant) prior to the procedure.  Cardiac arrest (rare). BEFORE THE PROCEDURE   You may have tests to detect blood clots in your heart and to evaluate heart function.  You may start taking anticoagulants so your blood does not clot as easily.   Medicines may be given to help stabilize your heart rate and rhythm. PROCEDURE  You will be given medicine through an  IV tube to reduce discomfort and make you sleepy (sedative).   An electrical shock will be delivered. AFTER THE PROCEDURE Your heart rhythm will be watched to make sure it does not change.    This information is not intended to replace advice given to you by your health care provider. Make sure you discuss any questions you have with your health care provider.   Document Released: 02/15/2002 Document Revised: 03/18/2014 Document Reviewed: 09/09/2012 Elsevier Interactive Patient Education [employer].    If you need a refill on your cardiac medications before your next appointment, please call your pharmacy.      Signed, Angelena Form, PA-C  12/27/2015 9:51 AM    [facility] [address], [place], [place]  [address] Phone: [phone]; Fax: [phone]

## 2016-01-05 NOTE — Anesthesia Preprocedure Evaluation (Addendum)
Anesthesia Evaluation  Patient identified by MRN, date of birth, ID band Patient awake    Reviewed: Allergy & Precautions, H&P , NPO status , Patient's Chart, lab work & pertinent test results  Airway Mallampati: II  TM Distance: >3 FB Neck ROM: Full    Dental no notable dental hx. (+) Edentulous Upper, Edentulous Lower, Dental Advisory Given   Pulmonary asthma , sleep apnea ,    Pulmonary exam normal breath sounds clear to auscultation       Cardiovascular + CAD and + Past MI  + dysrhythmias + Valvular Problems/Murmurs AS  Rhythm:Regular Rate:Normal     Neuro/Psych negative neurological ROS  negative psych ROS   GI/Hepatic Neg liver ROS, GERD  ,  Endo/Other  diabetes, Insulin Dependent, Oral Hypoglycemic AgentsHypothyroidism   Renal/GU negative Renal ROS  negative genitourinary   Musculoskeletal  (+) Arthritis , Osteoarthritis,    Abdominal   Peds  Hematology negative hematology ROS (+) anemia ,   Anesthesia Other Findings   Reproductive/Obstetrics negative OB ROS                             Anesthesia Physical  Anesthesia Plan  ASA: III  Anesthesia Plan: MAC   Post-op Pain Management:    Induction:   Airway Management Planned:   Additional Equipment:   Intra-op Plan:   Post-operative Plan:   Informed Consent: I have reviewed the patients History and Physical, chart, labs and discussed the procedure including the risks, benefits and alternatives for the proposed anesthesia with the patient or authorized representative who has indicated his/her understanding and acceptance.   Dental advisory given  Plan Discussed with: CRNA  Anesthesia Plan Comments:        Anesthesia Quick Evaluation

## 2016-01-05 NOTE — Anesthesia Procedure Notes (Signed)
Procedure Name: MAC Date/Time: 01/05/2016 10:13 AM Performed by: Neldon Newport Pre-anesthesia Checklist: Timeout performed, Patient being monitored, Suction available, Emergency Drugs available and Patient identified Patient Re-evaluated:Patient Re-evaluated prior to inductionOxygen Delivery Method: Nasal cannula and Ambu bag Preoxygenation: Pre-oxygenation with 100% oxygen

## 2016-01-07 ENCOUNTER — Encounter (HOSPITAL_COMMUNITY): Payer: Self-pay | Admitting: Cardiovascular Disease

## 2016-01-07 NOTE — Progress Notes (Signed)
Cardiology Office Note    Date:  01/10/2016   ID:  Barry Officer Sr., DOB Dec 13, 1932, MRN [id number]  PCP:  [name], MD  Cardiologist:  Dr. [name]   CC: follow up- atrial flutter s/p DCCV  History of Present Illness:  Barry EMGE Sr. is a 82 y.o. male with a history of HLD, DMT2, HTN, OSA, PVD, trifascicular block and recently diagnosed CAD s/p CABG x5V with post op atrial flutter who presents to clinic for follow up after recent DCCV.   Recently admitted from 9/16-9/28/17. He presented with chest pain and ruled in for NSTEMI (pk trop 1.62). Cath showed critical 3 vessel and left main CAD and he was referred for CABG. Echo results showed mild AS, mild MR and LVEF 45-50%. He underwent a CABG x 5 on 11/29/2015. Post op course was c/b  atrial flutter and volume overload requiring diuresis. Given AV dissociation and history of trifascicular block, amiodarone and other AV nodal blocking agents were avoided. He was rate-controlled due to intrinsic AV block. Cardiology recommend anticoagulation with Eliquis 5mg  BID and outpatient cardioversion attempt if he did not spontaneously convert in 3 weeks.   I saw him in clinic on 12/14/15. He was doing well but remained in atrial flutter. Plan was for DCCV after 3 weeks of uninterrupted Eliquis. I saw him back in clinic on 12/27/15. He was still in atrial flutter and feeling weak. He was set up for DCCV. It was later learned that he has missed several doses of Eliquis accidentally. He was educated on the importance on not missing a dose and set up for TEE/DCCV on 01/05/16. He was successfully cardioverted back into sinus with 1st degree block and occasional Mobitz I second degree, but not bradycardic.  Today he presents to clinic for follow up. He has been feeling better with less fatigue. His biggest problem is arthritis. No LE edema, orthopnea or PND. No dizziness or syncope. No chest pain, SOB or palpitations. He has had bronchial trouble his whole life.  No cigarettes.    Past Medical History:  Diagnosis Date  . Anemia    hx of year ago no problems now per pt   . Arthritis    generalized   . Asthma   . Atypical chest pain   . Bladder neck contracture   . Chronic back pain   . Complication of anesthesia    POST OP HYPOTENSION  . Dementia    per wife pt had neurological evaluation  . Diverticulosis of colon   . First degree heart block   . GERD (gastroesophageal reflux disease)   . Heart murmur   . History of basal cell carcinoma excision    forehead and x6 area forearms  . History of Bell's palsy    x2  1998 &  2004 left side --  residual slight left eye droop and vace  . History of chronic bronchitis   . History of colon polyps   . History of gout   . History of hypertension   . History of MI (myocardial infarction)    08/ 2010  secondary acute renal failure/ dehydration--  medical management  . History of pulmonary embolus (PE)    2010  . Hypogonadism male   . Hypotension   . Hypothyroidism   . OSA (obstructive sleep apnea)    severe per study 2007/  non-compliant cpap  . Peripheral vascular disease ([place])   . Prostate carcinoma [facility]) urologist-  dr Gaynelle Arabian  DX 2007  Gleason 6  and recurrent 2012  s/p  cryoablation of prostate both times  . RBBB (right bundle branch block)   . Recovering alcoholic in remission ([place])   . Renal calculus, bilateral   . Type 2 diabetes mellitus ([place])     Past Surgical History:  Procedure Laterality Date  . CARDIAC CATHETERIZATION N/A 11/27/2015   Procedure: Left Heart Cath and Coronary Angiography;  Surgeon: Peter M [name], MD;  Location: [place] [facility];  Service: Cardiovascular;  Laterality: N/A;  . CARDIOVASCULAR STRESS TEST  05-19-2012  dr Tressia Miners turner   normal perfusionstudy/  no ischemia/  attenuation artifact inferoapex region of myocardium/  note gate secondary to rhythm irregularity  . CARDIOVERSION N/A 01/05/2016   Procedure: CARDIOVERSION;  Surgeon: Sanda Klein,  MD;  Location: [place];  Service: Cardiovascular;  Laterality: N/A;  . CATARACT EXTRACTION W/ INTRAOCULAR LENS IMPLANT Right 2014  . COLONOSCOPY W/ POLYPECTOMY  last one 08-10-2013  . CORONARY ARTERY BYPASS GRAFT N/A 11/29/2015   Procedure: CORONARY ARTERY BYPASS GRAFTING (CABG), ON PUMP, TIMES FIVE, USING LEFT INTERNAL MAMMARY ARTERY, RIGHT GREATER SAPHENOUS VEIN HARVESTED ENDOSCOPICALLY;  Surgeon: [name], MD;  Location: [place];  Service: Open Heart Surgery;  Laterality: N/A;  LIMA-LAD; SEQ SVG-OM1-OM2;SVG-DIAG; SVG-PL  . CYSTOSCOPY WITH URETHRAL DILATATION N/A 01/14/2014   Procedure: URETHRAL MEATAL DILATATION;  Surgeon: Ailene Rud, MD;  Location: [facility];  Service: Urology;  Laterality: N/A;  . EXCISION BENIGN SKIN LESION OF BACK  10/ 2011  . HIATAL HERNIA REPAIR  1983  . INGUINAL HERNIA REPAIR Right 10-14-2001  . INTRAOPERATIVE TRANSESOPHAGEAL ECHOCARDIOGRAM N/A 11/29/2015   Procedure: INTRAOPERATIVE TRANSESOPHAGEAL ECHOCARDIOGRAM;  Surgeon: [name], MD;  Location: [place];  Service: Open Heart Surgery;  Laterality: N/A;  . KNEE ARTHROSCOPY W/ MENISCECTOMY Right 01-25-2003   and chondroplasty  . [place]  . PROSTATE CRYOABLATION  09-17-2010  &  04-12-2005  . REPAIR RIGHT ARM TENDON INJURY  1975  . SHOULDER OPEN ROTATOR CUFF REPAIR  03/20/2011   Procedure: ROTATOR CUFF REPAIR SHOULDER OPEN;  Surgeon: Tobi Bastos;  Location: [facility];  Service: Orthopedics;  Laterality: Right;  . TEE WITHOUT CARDIOVERSION N/A 01/05/2016   Procedure: TRANSESOPHAGEAL ECHOCARDIOGRAM (TEE);  Surgeon: Sanda Klein, MD;  Location: [facility];  Service: Cardiovascular;  Laterality: N/A;  . TONSILLECTOMY  as child  . TRANSTHORACIC ECHOCARDIOGRAM  01-11-2013  dr [name]   moderate LVH/  grade I diastolic dysfunction/ ef [id number]  mild AV stenosis /  mild MR/  mild LAE  . TRANSURETHRAL RESECTION OF BLADDER TUMOR N/A 01/14/2014   Procedure:  TRANSURETHRAL INCISION OF BLADDER NECK CONTRACTURE;  Surgeon: Ailene Rud, MD;  [facility];  Service: Urology;  Laterality: N/A;  . Vantus Implant     Hormones for Prostate  . VENTRAL HERNIA REPAIR  1998    Current Medications: Outpatient Medications Prior to Visit  Medication Sig Dispense Refill  . acetaminophen (TYLENOL) 500 MG tablet Take 500 mg by mouth every 8 (eight) hours as needed for mild pain, moderate pain or headache.     . albuterol (PROVENTIL HFA;VENTOLIN HFA) 108 (90 Base) MCG/ACT inhaler Inhale 2 puffs into the lungs every 6 (six) hours. 1 Inhaler 1  . apixaban (ELIQUIS) 5 MG TABS tablet Take 1 tablet (5 mg total) by mouth 2 (two) times daily. 60 tablet 1  . aspirin EC 81 MG tablet Take 81  mg by mouth daily.     . budesonide-formoterol (SYMBICORT) 160-4.5 MCG/ACT inhaler Inhale 2 puffs into the lungs 2 (two) times daily. 3 Inhaler 3  . CRESTOR 10 MG tablet TAKE ONE TABLET BY MOUTH ONCE DAILY 90 tablet 1  . ferrous sulfate 325 (65 FE) MG EC tablet Take 325 mg by mouth daily with breakfast.    . finasteride (PROSCAR) 5 MG tablet Take 5 mg by mouth every morning.     . gabapentin (NEURONTIN) 300 MG capsule TAKE 2 CAPSULES BY MOUTH 3 TIMES DAILY 540 capsule 1  . glucose blood (ONE TOUCH ULTRA TEST) test strip Use as instructed to check blood sugar 3 times per day dx code E11.65 100 each 5  . Insulin Glargine (TOUJEO SOLOSTAR) 300 UNIT/ML SOPN Inject 26 Units into the skin every morning.    . insulin lispro protamine-lispro (HUMALOG 75/25 MIX) (75-25) 100 UNIT/ML SUSP injection Inject 20 units subcutaneously every morning and inject 26 units subcutaneously in the evening daily    . levothyroxine (SYNTHROID, LEVOTHROID) 112 MCG tablet Take 112 mcg by mouth daily before breakfast.    . lisinopril (PRINIVIL,ZESTRIL) 40 MG tablet Take 1 tablet (40 mg total) by mouth daily. 30 tablet 1  . menthol-thymol (ABSORBINE JR) LIQD Apply 1 application topically  as needed. For pain.    . metFORMIN (GLUCOPHAGE) 500 MG tablet Take 1,000 mg by mouth daily with supper.    . nitroGLYCERIN (NITROSTAT) 0.4 MG SL tablet Place 1 tablet (0.4 mg total) under the tongue every 5 (five) minutes as needed for chest pain. 25 tablet 3  . Polyethyl Glycol-Propyl Glycol (SYSTANE) 0.4-0.3 % SOLN Apply 1-2 drops to eye 3 (three) times daily as needed (for dry eyes).    . polyethylene glycol (MIRALAX / GLYCOLAX) packet Take 17 g by mouth daily as needed for mild constipation, moderate constipation or severe constipation. 1 capful daily as needed    . triamcinolone (NASACORT AQ) 55 MCG/ACT AERO nasal inhaler Place 1-2 sprays into the nose daily.     . vitamin B-12 (CYANOCOBALAMIN) 1000 MCG tablet Take 1,000 mcg by mouth daily.    . traMADol (ULTRAM) 50 MG tablet Take 1 tablet (50 mg total) by mouth every 6 (six) hours. (Patient not taking: Reported on 01/10/2016) 30 tablet 0   No facility-administered medications prior to visit.      Allergies:   Penicillins and Prednisone   Social History   Social History  . Marital status: Married    Spouse name: N/A  . Number of children: N/A  . Years of education: N/A   Occupational History  . Retired      Personal assistant    Social History Main Topics  . Smoking status: Never Smoker  . Smokeless tobacco: Never Used  . Alcohol use No     Comment: RECOVERING ALCOHOLIC none since [id number]   . Drug use: No  . Sexual activity: Not Currently   Other Topics Concern  . None   Social History Narrative  . None     Family History:  The patient's family history includes Cancer in his brother; Heart disease in his mother; Stroke in his father.     ROS  All other systems reviewed and are negative.   PHYSICAL EXAM:   VS:  BP 136/88   Pulse 71   Ht 5\' 11"  (1.803 m)   Wt 187 lb 6.4 oz (85 kg)   BMI 26.14 kg/m    GEN: Well nourished, well  developed, in no acute distress  HEENT: normal  Neck: no JVD, carotid bruits, or  masses Cardiac: irreg irreg; no murmurs, rubs, or gallops,no edema  Respiratory:  rhoncorous on auscultation bilaterally, normal work of breathing GI: soft, nontender, nondistended, + BS MS: no deformity or atrophy  Skin: warm and dry, no rash Neuro:  Alert and Oriented x 3, Strength and sensation are intact Psych: euthymic mood, full affect  Wt Readings from Last 3 Encounters:  01/10/16 187 lb 6.4 oz (85 kg)  01/05/16 185 lb (83.9 kg)  01/04/16 184 lb (83.5 kg)      Studies/Labs Reviewed:   EKG:  EKG is ordered today.  The ekg ordered today demonstrates mobitz type 1 HR 71  Recent Labs: 11/25/2015: B Natriuretic Peptide 1,139.9 11/30/2015: Magnesium 2.2 12/12/2015: ALT 10; TSH 7.31 12/27/2015: BUN 14; Creat 1.17; Hemoglobin 11.4; Platelets 206; Potassium 4.5; Sodium 142   Lipid Panel    Component Value Date/Time   CHOL 117 10/31/2015 1340   TRIG 98.0 10/31/2015 1340   HDL 44.60 10/31/2015 1340   CHOLHDL 3 10/31/2015 1340   VLDL 19.6 10/31/2015 1340   LDLCALC 53 10/31/2015 1340    Additional studies/ records that were reviewed today include:  Left Heart Cath and Coronary Angiography by Dr. [name] on 11/27/2015:  Conclusion     Ost LM to LM lesion, 80 %stenosed.  Prox LAD lesion, 80 %stenosed.  Mid LAD lesion, 90 %stenosed.  Dist LAD lesion, 95 %stenosed.  Ost 1st Diag to 1st Diag lesion, 100 %stenosed.  Ost Ramus lesion, 90 %stenosed.  Ost Cx to Prox Cx lesion, 90 %stenosed.  Ost 1st Mrg to 1st Mrg lesion, 80 %stenosed.  Prox Cx to Mid Cx lesion, 100 %stenosed.  Prox RCA lesion, 95 %stenosed.  Dist RCA lesion, 90 %stenosed.  1. Critical 3 vessel and left main CAD 2. Mildly elevated LV EDP 3. No significant AV gradient by pullback.    Coronary artery bypass grafting x5 with left internal mammary to the left anterior descending coronary artery reverse saphenous vein graft to the diagonal coronary artery, sequential reverse saphenous vein graft  to the first and second obtuse marginal reverse saphenous vein graft to the posterior lateral branch of the right coronary artery with right thigh and calf greater saphenous vein harvesting endoscopically by Dr. Servando Snare on 11/29/2015.   2D ECHO: 11/28/2015 LV EF: 45% -   50% Study Conclusions - Left ventricle: The cavity size was normal. There was severe   concentric hypertrophy. Systolic function was mildly reduced. The   estimated ejection fraction was in the range of 45% to 50%.   Hypokinesis of the apical, mid-apical anteroseptal and   inferoseptal myocardium, and apical inferolateral, and inferior   myocardium. Doppler parameters are consistent with a reversible   restrictive pattern, indicative of decreased left ventricular   diastolic compliance and/or increased left atrial pressure (grade   3 diastolic dysfunction). - Aortic valve: Trileaflet; moderately thickened, moderately   calcified leaflets. Valve mobility was restricted. There was mild   stenosis. There was no regurgitation. Valve area (VTI): 0.88   cm^2. Valve area (Vmax): 0.96 cm^2. Valve area (Vmean): 0.86   cm^2. - Mitral valve: Calcified annulus. Transvalvular velocity was   within the normal range. There was no evidence for stenosis.   There was mild regurgitation. - Left atrium: The atrium was severely dilated. - Right ventricle: The cavity size was normal. Wall thickness was   normal. Systolic function was normal. - Atrial septum:  A patent foramen ovale cannot be excluded. - Tricuspid valve: There was mild regurgitation. Impressions: - Aortic valve appears heavily calcified and thickend. There   appears to be mild aortic stenosis by peak velocity and mean   gradients. However, given reduced systolic function, cannot rule   out more severe valvular heart disease or &quot;low flow, low   gradient&quot; aortic stenosis.   ASSESSMENT & PLAN:   CAD: LM 3VD seen by s/p CABG x5V. Continue ASA and statin.    Post op atrial flutter s/p DCCV 10/27: now it Mobitz type I. Reviewed ECG with Dr. Caryl Comes. -- No AV nodal blocking agents. Continue Eliquis 5mg  BID for CHADSVASC of 6 (CHF, HTN, age, DM, CAD).   Bifascicular block, Mobitz type I: significant conduction abnormality seen on EKG, avoiding Beta blockers. No syncope. Continue to monitor  Chronic kidney disease stage III: creat stable   Hyperlipidemia: LDL 53. Continue Crestor 10mg  daily.   Diabetes with nephropathy: continue current regimen and follow up with Dr. Dwyane Dee  HTN: BP well controlled on current regimen   Medication Adjustments/Labs and Tests Ordered: Current medicines are reviewed at length with the patient today.  Concerns regarding medicines are outlined above.  Medication changes, Labs and Tests ordered today are listed in the Patient Instructions below. Patient Instructions  Medication Instructions:  Your physician recommends that you continue on your current medications as directed. Please refer to the Current Medication list given to you today.   Labwork: None ordered  Testing/Procedures: None ordered  Follow-Up: Your physician recommends that you schedule a follow-up appointment in: [place] 03/21/2016 WITH DR. [name]   Any Other Special Instructions Will Be Listed Below (If Applicable).     If you need a refill on your cardiac medications before your next appointment, please call your pharmacy.      Signed, Angelena Form, PA-C  01/10/2016 2:26 PM    [facility] [address], [place], [place]  [address] Phone: [phone]; Fax: [phone]

## 2016-01-09 NOTE — Addendum Note (Signed)
Addendum  created 01/09/16 1841 by Nolon Nations, MD   Sign clinical note

## 2016-01-10 ENCOUNTER — Ambulatory Visit (INDEPENDENT_AMBULATORY_CARE_PROVIDER_SITE_OTHER): Payer: Medicare Other | Admitting: Physician Assistant

## 2016-01-10 ENCOUNTER — Encounter: Payer: Self-pay | Admitting: Physician Assistant

## 2016-01-10 VITALS — BP 136/88 | HR 71 | Ht 71.0 in | Wt 187.4 lb

## 2016-01-10 DIAGNOSIS — Z951 Presence of aortocoronary bypass graft: Secondary | ICD-10-CM

## 2016-01-10 DIAGNOSIS — I200 Unstable angina: Secondary | ICD-10-CM

## 2016-01-10 DIAGNOSIS — I10 Essential (primary) hypertension: Secondary | ICD-10-CM

## 2016-01-10 DIAGNOSIS — E785 Hyperlipidemia, unspecified: Secondary | ICD-10-CM

## 2016-01-10 DIAGNOSIS — I4892 Unspecified atrial flutter: Secondary | ICD-10-CM

## 2016-01-10 DIAGNOSIS — E118 Type 2 diabetes mellitus with unspecified complications: Secondary | ICD-10-CM

## 2016-01-10 DIAGNOSIS — I441 Atrioventricular block, second degree: Secondary | ICD-10-CM

## 2016-01-10 NOTE — Patient Instructions (Addendum)
Medication Instructions:  Your physician recommends that you continue on your current medications as directed. Please refer to the Current Medication list given to you today.   Labwork: None ordered  Testing/Procedures: None ordered  Follow-Up: Your physician recommends that you schedule a follow-up appointment in: [place] 03/21/2016 WITH DR. [name]   Any Other Special Instructions Will Be Listed Below (If Applicable).     If you need a refill on your cardiac medications before your next appointment, please call your pharmacy.

## 2016-01-12 ENCOUNTER — Other Ambulatory Visit: Payer: Self-pay | Admitting: Endocrinology

## 2016-01-16 ENCOUNTER — Encounter: Payer: Self-pay | Admitting: Endocrinology

## 2016-01-16 ENCOUNTER — Ambulatory Visit (INDEPENDENT_AMBULATORY_CARE_PROVIDER_SITE_OTHER): Payer: Medicare Other | Admitting: Endocrinology

## 2016-01-16 VITALS — BP 130/76 | HR 79 | Ht 71.0 in | Wt 184.0 lb

## 2016-01-16 DIAGNOSIS — I200 Unstable angina: Secondary | ICD-10-CM

## 2016-01-16 DIAGNOSIS — L723 Sebaceous cyst: Secondary | ICD-10-CM | POA: Diagnosis not present

## 2016-01-16 NOTE — Progress Notes (Signed)
Patient ID: Barry Officer Sr., male   DOB: 1932-08-06, 82 y.o.   MRN: [id number]   Reason for Appointment: Lump in  Left chest  History of Present Illness   PROBLEM 1:  About 2-3 weeks ago he noticed a lump on the left side of his chest which was initially a little tender No trauma in that area He thinks it is not painful and may be a little smaller now  PROBLEM 2: Diabetes  He thinks his blood sugars have not been consistently high as before only when he gets into sweets No hypoglycemia in the last 2-3 weeks His insulin doses were adjusted on the last visit  Following is a copy of the previous note:  Diagnosis: Type 2 diabetes mellitus, date of diagnosis: 1997.   He has been on basal bolus insulin regimen for a few years.  His blood sugars are usually variably controlled and A1c is usually over 8%  except once was 7.2.  Previously had tried Victoza with some benefit but did not continue this because of perceived side effect of constipation and also out-of-pocket expense  RECENT history:   Insulin regimen: Toujeo insulin 32 units in am  Humalog mix insulin 20 units-30   He has had inconsistent control of his diabetes since at least late 2015 Although his blood sugars improved  after changing Humalog to Humalog mix insulin before each meal in 2/16 he has had labile blood sugars  His A1c is now 7 done in hospital, he was discharged on 9/28 and does not have many blood sugars on his monitor today  His blood sugars are showing the following patterns and has the following problems identified:  FASTING blood sugars are now tending to be lower especially the last 3 days, lowest reading 51  Although his wife thinks he has had some low sugars overnight also he has no other documentation  He is still recovering from his cardiac bypass and overall eating smaller portions  However he still eating some snacks and ice cream in the afternoon  Most likely these are  causing readings over 200 sometimes at suppertime  INSULIN: He was supposed to take some insulin in the afternoon for his large snacks but he does not do so  Although he has not checked his readings after evening meal he has not reported symptoms of low sugars despite taking relatively large amount of Humalog mix at suppertime  He has taken metformin although he says that if he takes them both together he will sometimes choke  Monitors blood glucose: 1-2 times  a day.  Glucometer:  One Touch.  Glucose readings  from download in 10/17:   Mean values apply above for all meters except median for One Touch  PRE-MEAL Fasting 3 PM  Dinner Bedtime Overall  Glucose range: 51-142  113, 197  93-251     Mean/median:     113     Dietician visit: Most recent:1/14.  CDE visit 10/16   Exercise: a little walking  Wt Readings from Last 3 Encounters:  01/16/16 184 lb (83.5 kg)  01/10/16 187 lb 6.4 oz (85 kg)  01/05/16 185 lb (83.9 kg)   Lab Results  Component Value Date   HGBA1C 7.0 (H) 11/28/2015   HGBA1C 7.4 (H) 10/31/2015   HGBA1C 7.1 (H) 07/31/2015   Lab Results  Component Value Date   MICROALBUR 8.2 (H) 12/15/2015   LDLCALC 53 10/31/2015  CREATININE 1.17 (H) 12/27/2015   Other ACTIVE problems are discussed in review of systems    No visits with results within 1 Week(s) from this visit.  Latest known visit with results is:  Admission on 01/05/2016, Discharged on 01/05/2016  Component Date Value Ref Range Status  . Glucose-Capillary 01/05/2016 150* 65 - 99 mg/dL Final    OTHER active problems addressed today are reviewed in review of systems     Medication List       Accurate as of 01/16/16  2:59 PM. Always use your most recent med list.          acetaminophen 500 MG tablet Commonly known as:  TYLENOL Take 500 mg by mouth every 8 (eight) hours as needed for mild pain, moderate pain or headache.   albuterol 108 (90 Base) MCG/ACT inhaler Commonly known as:  PROVENTIL  HFA;VENTOLIN HFA Inhale 2 puffs into the lungs every 6 (six) hours.   apixaban 5 MG Tabs tablet Commonly known as:  ELIQUIS Take 1 tablet (5 mg total) by mouth 2 (two) times daily.   aspirin EC 81 MG tablet Take 81 mg by mouth daily.   budesonide-formoterol 160-4.5 MCG/ACT inhaler Commonly known as:  SYMBICORT Inhale 2 puffs into the lungs 2 (two) times daily.   CRESTOR 10 MG tablet Generic drug:  rosuvastatin TAKE ONE TABLET BY MOUTH ONCE DAILY   ferrous sulfate 325 (65 FE) MG EC tablet Take 325 mg by mouth daily with breakfast.   finasteride 5 MG tablet Commonly known as:  PROSCAR Take 5 mg by mouth every morning.   gabapentin 300 MG capsule Commonly known as:  NEURONTIN TAKE 2 CAPSULES BY MOUTH 3 TIMES DAILY   glucose blood test strip Commonly known as:  ONE TOUCH ULTRA TEST Use as instructed to check blood sugar 3 times per day dx code E11.65   insulin lispro protamine-lispro (75-25) 100 UNIT/ML Susp injection Commonly known as:  HUMALOG 75/25 MIX Inject 20 units subcutaneously every morning and inject 26 units subcutaneously in the evening daily   levothyroxine 112 MCG tablet Commonly known as:  SYNTHROID, LEVOTHROID Take 112 mcg by mouth daily before breakfast.   lisinopril 40 MG tablet Commonly known as:  PRINIVIL,ZESTRIL Take 1 tablet (40 mg total) by mouth daily.   menthol-thymol Liqd Apply 1 application topically as needed. For pain.   metFORMIN 500 MG tablet Commonly known as:  GLUCOPHAGE Take 1,000 mg by mouth daily with supper.   NASACORT AQ 55 MCG/ACT Aero nasal inhaler Generic drug:  triamcinolone Place 1-2 sprays into the nose daily.   nitroGLYCERIN 0.4 MG SL tablet Commonly known as:  NITROSTAT Place 1 tablet (0.4 mg total) under the tongue every 5 (five) minutes as needed for chest pain.   polyethylene glycol packet Commonly known as:  MIRALAX / GLYCOLAX Take 17 g by mouth daily as needed for mild constipation, moderate constipation or  severe constipation. 1 capful daily as needed   SYSTANE 0.4-0.3 % Soln Generic drug:  Polyethyl Glycol-Propyl Glycol Apply 1-2 drops to eye 3 (three) times daily as needed (for dry eyes).   TOUJEO SOLOSTAR 300 UNIT/ML Sopn Generic drug:  Insulin Glargine Inject 26 Units into the skin every morning.   traMADol 50 MG tablet Commonly known as:  ULTRAM Take 50 mg by mouth every 6 (six) hours as needed for moderate pain.   vitamin B-12 1000 MCG tablet Commonly known as:  CYANOCOBALAMIN Take 1,000 mcg by mouth daily.  Allergies:  Allergies  Allergen Reactions  . Penicillins Swelling    Eyes and lips Has patient had a PCN reaction causing immediate rash, facial/tongue/throat swelling, SOB or lightheadedness with hypotension: Yes Has patient had a PCN reaction causing severe rash involving mucus membranes or skin necrosis: No Has patient had a PCN reaction that required hospitalization No Has patient had a PCN reaction occurring within the last 10 years: No If all of the above answers are "NO", then may proceed with Cephalosporin use   . Prednisone Other (See Comments)    Hyperglycemia and AMS    Past Medical History:  Diagnosis Date  . Anemia    hx of year ago no problems now per pt   . Arthritis    generalized   . Asthma   . Atypical chest pain   . Bladder neck contracture   . Chronic back pain   . Complication of anesthesia    POST OP HYPOTENSION  . Dementia    per wife pt had neurological evaluation  . Diverticulosis of colon   . First degree heart block   . GERD (gastroesophageal reflux disease)   . Heart murmur   . History of basal cell carcinoma excision    forehead and x6 area forearms  . History of Bell's palsy    x2  1998 &  2004 left side --  residual slight left eye droop and vace  . History of chronic bronchitis   . History of colon polyps   . History of gout   . History of hypertension   . History of MI (myocardial infarction)    08/ 2010   secondary acute renal failure/ dehydration--  medical management  . History of pulmonary embolus (PE)    2010  . Hypogonadism male   . Hypotension   . Hypothyroidism   . OSA (obstructive sleep apnea)    severe per study 2007/  non-compliant cpap  . Peripheral vascular disease ([place])   . Prostate carcinoma [facility]) urologist-  dr Gaynelle Arabian   DX 2007  Gleason 6  and recurrent 2012  s/p  cryoablation of prostate both times  . RBBB (right bundle branch block)   . Recovering alcoholic in remission ([place])   . Renal calculus, bilateral   . Type 2 diabetes mellitus ([place])     Past Surgical History:  Procedure Laterality Date  . CARDIAC CATHETERIZATION N/A 11/27/2015   Procedure: Left Heart Cath and Coronary Angiography;  Surgeon: Peter M [name], MD;  Location: [place] [facility];  Service: Cardiovascular;  Laterality: N/A;  . CARDIOVASCULAR STRESS TEST  05-19-2012  dr Tressia Miners turner   normal perfusionstudy/  no ischemia/  attenuation artifact inferoapex region of myocardium/  note gate secondary to rhythm irregularity  . CARDIOVERSION N/A 01/05/2016   Procedure: CARDIOVERSION;  Surgeon: Sanda Klein, MD;  Location: [place];  Service: Cardiovascular;  Laterality: N/A;  . CATARACT EXTRACTION W/ INTRAOCULAR LENS IMPLANT Right 2014  . COLONOSCOPY W/ POLYPECTOMY  last one 08-10-2013  . CORONARY ARTERY BYPASS GRAFT N/A 11/29/2015   Procedure: CORONARY ARTERY BYPASS GRAFTING (CABG), ON PUMP, TIMES FIVE, USING LEFT INTERNAL MAMMARY ARTERY, RIGHT GREATER SAPHENOUS VEIN HARVESTED ENDOSCOPICALLY;  Surgeon: [name], MD;  Location: [place];  Service: Open Heart Surgery;  Laterality: N/A;  LIMA-LAD; SEQ SVG-OM1-OM2;SVG-DIAG; SVG-PL  . CYSTOSCOPY WITH URETHRAL DILATATION N/A 01/14/2014   Procedure: URETHRAL MEATAL DILATATION;  Surgeon: Ailene Rud, MD;  Location: [facility];  Service: Urology;  Laterality:  N/A;  . EXCISION BENIGN SKIN LESION OF BACK  10/ 2011  . HIATAL HERNIA  REPAIR  1983  . INGUINAL HERNIA REPAIR Right 10-14-2001  . INTRAOPERATIVE TRANSESOPHAGEAL ECHOCARDIOGRAM N/A 11/29/2015   Procedure: INTRAOPERATIVE TRANSESOPHAGEAL ECHOCARDIOGRAM;  Surgeon: [name], MD;  Location: [place];  Service: Open Heart Surgery;  Laterality: N/A;  . KNEE ARTHROSCOPY W/ MENISCECTOMY Right 01-25-2003   and chondroplasty  . [place]  . PROSTATE CRYOABLATION  09-17-2010  &  04-12-2005  . REPAIR RIGHT ARM TENDON INJURY  1975  . SHOULDER OPEN ROTATOR CUFF REPAIR  03/20/2011   Procedure: ROTATOR CUFF REPAIR SHOULDER OPEN;  Surgeon: Tobi Bastos;  Location: [facility];  Service: Orthopedics;  Laterality: Right;  . TEE WITHOUT CARDIOVERSION N/A 01/05/2016   Procedure: TRANSESOPHAGEAL ECHOCARDIOGRAM (TEE);  Surgeon: Sanda Klein, MD;  Location: [facility];  Service: Cardiovascular;  Laterality: N/A;  . TONSILLECTOMY  as child  . TRANSTHORACIC ECHOCARDIOGRAM  01-11-2013  dr [name]   moderate LVH/  grade I diastolic dysfunction/ ef [id number]  mild AV stenosis /  mild MR/  mild LAE  . TRANSURETHRAL RESECTION OF BLADDER TUMOR N/A 01/14/2014   Procedure: TRANSURETHRAL INCISION OF BLADDER NECK CONTRACTURE;  Surgeon: Ailene Rud, MD;  Location: [facility];  Service: Urology;  Laterality: N/A;  . Vantus Implant     Hormones for Prostate  . VENTRAL HERNIA REPAIR  1998    Family History  Problem Relation Age of Onset  . Heart disease Mother   . Stroke Father   . Cancer Brother     unsure of type    Social History:  reports that he has never smoked. He has never used smokeless tobacco. He reports that he does not drink alcohol or use drugs.  Review of Systems  Following is a copy of the previous note:  B-12 deficiency:He has been on B12 tablets 1000 mg daily  He has  COPD with chronic cough, treated by the pulmonologist and is taking steroid inhaler with good relief of symptoms   HYPERLIPIDEMIA: Well controlled with  Crestor   Lab Results  Component Value Date   CHOL 117 10/31/2015   HDL 44.60 10/31/2015   LDLCALC 53 10/31/2015   TRIG 98.0 10/31/2015   CHOLHDL 3 10/31/2015     He was diagnosed have had sleep apnea but he declines CPAP     History of mild hypothyroidism which is Usually well controlled  on 112 g   Not clear why his TSH is higher now, he has been getting this regularly and not taking any iron at the same time   Lab Results  Component Value Date   TSH 7.31 (H) 12/12/2015   ANEMIA: This isSomewhat more prominent since his surgery Not taking any iron currently  Lab Results  Component Value Date   WBC 9.2 12/27/2015   HGB 11.4 (L) 12/27/2015   HCT 35.9 (L) 12/27/2015   MCV 86.1 12/27/2015   PLT 206 12/27/2015        Examination:   BP 130/76   Pulse 79   Ht 5\' 11"  (1.803 m)   Wt 184 lb (83.5 kg)   SpO2 96%   BMI 25.66 kg/m   Body mass index is 25.66 kg/m.    He has a subcutaneous 1-1.5 cm nontender nodule on the left lateral chest area clearly separate from his breast area This is firm and smooth than the skin has a slight  greenish Hue on the surface without any signs of redness domestic present  Assesment/Plan:  He probably has a subcutaneous cyst on the left chest wall which is benign Reassured him that this does not need surgical intervention He will review this  with his dermatologist, meanwhile continue to monitor  DIABETES: He will continue his insulin doses unchanged as by recall his blood sugars are relatively stable except when he goes off his diet and has less hypoglycemia   There are no Patient Instructions on file for this visit.       Devesh Monforte 01/16/2016, 2:59 PM

## 2016-01-23 ENCOUNTER — Other Ambulatory Visit (INDEPENDENT_AMBULATORY_CARE_PROVIDER_SITE_OTHER): Payer: Medicare Other

## 2016-01-23 DIAGNOSIS — E038 Other specified hypothyroidism: Secondary | ICD-10-CM

## 2016-01-23 DIAGNOSIS — E1165 Type 2 diabetes mellitus with hyperglycemia: Secondary | ICD-10-CM

## 2016-01-23 DIAGNOSIS — D509 Iron deficiency anemia, unspecified: Secondary | ICD-10-CM

## 2016-01-23 DIAGNOSIS — Z794 Long term (current) use of insulin: Secondary | ICD-10-CM | POA: Diagnosis not present

## 2016-01-23 DIAGNOSIS — E063 Autoimmune thyroiditis: Secondary | ICD-10-CM

## 2016-01-23 LAB — COMPREHENSIVE METABOLIC PANEL
ALK PHOS: 79 U/L (ref 39–117)
ALT: 7 U/L (ref 0–53)
AST: 12 U/L (ref 0–37)
Albumin: 3.6 g/dL (ref 3.5–5.2)
BILIRUBIN TOTAL: 0.4 mg/dL (ref 0.2–1.2)
BUN: 19 mg/dL (ref 6–23)
CO2: 25 meq/L (ref 19–32)
Calcium: 9.3 mg/dL (ref 8.4–10.5)
Chloride: 109 mEq/L (ref 96–112)
Creatinine, Ser: 1.21 mg/dL (ref 0.40–1.50)
GFR: 61.19 mL/min (ref >60.00)
GLUCOSE: 175 mg/dL — AB (ref 70–99)
POTASSIUM: 5.2 meq/L — AB (ref 3.5–5.1)
SODIUM: 141 meq/L (ref 135–145)
TOTAL PROTEIN: 6.7 g/dL (ref 6.0–8.3)

## 2016-01-23 LAB — CBC
HCT: 36.7 % — ABNORMAL LOW (ref 39.0–52.0)
HEMOGLOBIN: 11.6 g/dL — AB (ref 13.0–17.0)
MCHC: 31.7 g/dL (ref 30.0–36.0)
MCV: 85.3 fl (ref 78.0–100.0)
PLATELETS: 188 10*3/uL (ref 150.0–400.0)
RBC: 4.3 Mil/uL (ref 4.22–5.81)
RDW: 17.8 % — ABNORMAL HIGH (ref 11.5–15.5)
WBC: 8.8 10*3/uL (ref 4.0–10.5)

## 2016-01-23 LAB — IBC PANEL
Iron: 38 ug/dL — ABNORMAL LOW (ref 42–165)
SATURATION RATIOS: 14.4 % — AB (ref 20.0–50.0)
TRANSFERRIN: 188 mg/dL — AB (ref 212.0–360.0)

## 2016-01-23 LAB — TSH: TSH: 0.18 u[IU]/mL — ABNORMAL LOW (ref 0.35–4.50)

## 2016-01-25 ENCOUNTER — Other Ambulatory Visit: Payer: Self-pay | Admitting: Endocrinology

## 2016-01-26 ENCOUNTER — Ambulatory Visit (INDEPENDENT_AMBULATORY_CARE_PROVIDER_SITE_OTHER): Payer: Medicare Other | Admitting: Endocrinology

## 2016-01-26 ENCOUNTER — Encounter: Payer: Self-pay | Admitting: Endocrinology

## 2016-01-26 ENCOUNTER — Ambulatory Visit: Payer: Medicare Other | Admitting: Endocrinology

## 2016-01-26 VITALS — BP 138/62 | Wt 184.0 lb

## 2016-01-26 DIAGNOSIS — D509 Iron deficiency anemia, unspecified: Secondary | ICD-10-CM

## 2016-01-26 DIAGNOSIS — R05 Cough: Secondary | ICD-10-CM

## 2016-01-26 DIAGNOSIS — E1142 Type 2 diabetes mellitus with diabetic polyneuropathy: Secondary | ICD-10-CM

## 2016-01-26 DIAGNOSIS — E063 Autoimmune thyroiditis: Secondary | ICD-10-CM

## 2016-01-26 DIAGNOSIS — E1165 Type 2 diabetes mellitus with hyperglycemia: Secondary | ICD-10-CM | POA: Diagnosis not present

## 2016-01-26 DIAGNOSIS — I200 Unstable angina: Secondary | ICD-10-CM

## 2016-01-26 DIAGNOSIS — Z794 Long term (current) use of insulin: Secondary | ICD-10-CM | POA: Diagnosis not present

## 2016-01-26 DIAGNOSIS — G894 Chronic pain syndrome: Secondary | ICD-10-CM

## 2016-01-26 DIAGNOSIS — E038 Other specified hypothyroidism: Secondary | ICD-10-CM

## 2016-01-26 DIAGNOSIS — R059 Cough, unspecified: Secondary | ICD-10-CM

## 2016-01-26 NOTE — Progress Notes (Signed)
Patient ID: Barry Officer Sr., male   DOB: November 11, 1932, 82 y.o.   MRN: [id number]   Reason for Appointment: follow-up  of various issues   History of Present Illness   Diagnosis: Type 2 diabetes mellitus, date of diagnosis: 1997.   He has been on basal bolus insulin regimen for a few years.  His blood sugars are usually variably controlled and A1c is usually over 8%  except once was 7.2.  Previously had tried Victoza with some benefit but did not continue this because of perceived side effect of constipation and also out-of-pocket expense  RECENT history:   Insulin regimen: Toujeo insulin 26 units in am  Humalog mix insulin 20 units-26 acs   He has had inconsistent control of his diabetes since at least late 2015 Although his blood sugars improved  after changing Humalog to Humalog mix insulin before each meal in 2/16 he has had labile blood sugars  His A1c is fairly good as of 9/17 and was 7 done in hospital  His blood sugars are showing the following patterns and has the following problems identified:  FASTING blood sugars are usually near normal with occasional low normal readings, these are being checked between 10 AM-1 PM as he wakes up late  His wife thinks he had a significant low sugar during the night at about 2 AM and she did not test his sugar but is able to treat him with oral glucose  Glucose readings are progressively higher from morning to evening which significant variability in the afternoon  His wife thinks that he is still getting some sweets and cookies or ice cream in the afternoon  However randomly will have a relatively low sugar in the evenings also after supper  His blood sugars are consistently 200 or more in the last 10 days in the afternoon and sporadically higher in the morning also  He has maintained his weight, generally avoiding high-fat meals  Monitors blood glucose: 1-2 times  a day.  Glucometer:  One Touch.  Glucose readings      Mean values apply above for all meters except median for One Touch  PRE-MEAL Fasting Lunch Dinner Bedtime Overall  Glucose range: 62-298   191-353  48-449    Mean/median: 141   272  233  200    Dietician visit: Most recent:1/14.  CDE visit 10/16   Exercise: a little walking  Wt Readings from Last 3 Encounters:  01/26/16 184 lb (83.5 kg)  01/16/16 184 lb (83.5 kg)  01/10/16 187 lb 6.4 oz (85 kg)   Lab Results  Component Value Date   HGBA1C 7.0 (H) 11/28/2015   HGBA1C 7.4 (H) 10/31/2015   HGBA1C 7.1 (H) 07/31/2015   Lab Results  Component Value Date   MICROALBUR 8.2 (H) 12/15/2015   LDLCALC 53 10/31/2015   CREATININE 1.21 01/23/2016   Other ACTIVE problems are discussed in review of systems    Lab on 01/23/2016  Component Date Value Ref Range Status  . WBC 01/23/2016 8.8  4.0 - 10.5 K/uL Final  . RBC 01/23/2016 4.30  4.22 - 5.81 Mil/uL Final  . Platelets 01/23/2016 188.0  150.0 - 400.0 K/uL Final  . Hemoglobin 01/23/2016 11.6* 13.0 - 17.0 g/dL Final  . HCT 01/23/2016 36.7* 39.0 - 52.0 % Final  . MCV 01/23/2016 85.3  78.0 - 100.0 fl Final  . MCHC 01/23/2016 31.7  30.0 - 36.0 g/dL Final  .  RDW 01/23/2016 17.8* 11.5 - 15.5 % Final  . Iron 01/23/2016 38* 42 - 165 ug/dL Final  . Transferrin 01/23/2016 188.0* 212.0 - 360.0 mg/dL Final  . Saturation Ratios 01/23/2016 14.4* 20.0 - 50.0 % Final  . Sodium 01/23/2016 141  135 - 145 mEq/L Final  . Potassium 01/23/2016 5.2* 3.5 - 5.1 mEq/L Final  . Chloride 01/23/2016 109  96 - 112 mEq/L Final  . CO2 01/23/2016 25  19 - 32 mEq/L Final  . Glucose, Bld 01/23/2016 175* 70 - 99 mg/dL Final  . BUN 01/23/2016 19  6 - 23 mg/dL Final  . Creatinine, Ser 01/23/2016 1.21  0.40 - 1.50 mg/dL Final  . Total Bilirubin 01/23/2016 0.4  0.2 - 1.2 mg/dL Final  . Alkaline Phosphatase 01/23/2016 79  39 - 117 U/L Final  . AST 01/23/2016 12  0 - 37 U/L Final  . ALT 01/23/2016 7  0 - 53 U/L Final  . Total Protein 01/23/2016 6.7  6.0 - 8.3 g/dL  Final  . Albumin 01/23/2016 3.6  3.5 - 5.2 g/dL Final  . Calcium 01/23/2016 9.3  8.4 - 10.5 mg/dL Final  . GFR 01/23/2016 61.19  >60.00 mL/min Final  . TSH 01/23/2016 0.18* 0.35 - 4.50 uIU/mL Final    OTHER active problems addressed today are reviewed in review of systems     Medication List       Accurate as of 01/26/16 11:59 PM. Always use your most recent med list.          acetaminophen 500 MG tablet Commonly known as:  TYLENOL Take 500 mg by mouth every 8 (eight) hours as needed for mild pain, moderate pain or headache.   albuterol 108 (90 Base) MCG/ACT inhaler Commonly known as:  PROVENTIL HFA;VENTOLIN HFA Inhale 2 puffs into the lungs every 6 (six) hours.   apixaban 5 MG Tabs tablet Commonly known as:  ELIQUIS Take 1 tablet (5 mg total) by mouth 2 (two) times daily.   aspirin EC 81 MG tablet Take 81 mg by mouth daily.   budesonide-formoterol 160-4.5 MCG/ACT inhaler Commonly known as:  SYMBICORT Inhale 2 puffs into the lungs 2 (two) times daily.   CRESTOR 10 MG tablet Generic drug:  rosuvastatin TAKE ONE TABLET BY MOUTH ONCE DAILY   ferrous sulfate 325 (65 FE) MG EC tablet Take 325 mg by mouth daily with breakfast.   finasteride 5 MG tablet Commonly known as:  PROSCAR Take 5 mg by mouth every morning.   gabapentin 300 MG capsule Commonly known as:  NEURONTIN TAKE 2 CAPSULES BY MOUTH 3 TIMES DAILY   glucose blood test strip Commonly known as:  ONE TOUCH ULTRA TEST Use as instructed to check blood sugar 3 times per day dx code E11.65   insulin lispro protamine-lispro (75-25) 100 UNIT/ML Susp injection Commonly known as:  HUMALOG 75/25 MIX Inject 20 units subcutaneously every morning and inject 26 units subcutaneously in the evening daily   HUMALOG MIX 75/25 KWIKPEN (75-25) 100 UNIT/ML Kwikpen Generic drug:  Insulin Lispro Prot & Lispro INJECT 20 UNITS SUBCUTANEOUSLY IN THE MORNING AND 30 UNITS IN THE EVENING   levothyroxine 112 MCG tablet Commonly  known as:  SYNTHROID, LEVOTHROID Take 112 mcg by mouth daily before breakfast.   lisinopril 40 MG tablet Commonly known as:  PRINIVIL,ZESTRIL Take 1 tablet (40 mg total) by mouth daily.   menthol-thymol Liqd Apply 1 application topically as needed. For pain.   metFORMIN 500 MG tablet Commonly known as:  GLUCOPHAGE Take 1,000 mg by mouth daily with supper.   NASACORT AQ 55 MCG/ACT Aero nasal inhaler Generic drug:  triamcinolone Place 1-2 sprays into the nose daily.   nitroGLYCERIN 0.4 MG SL tablet Commonly known as:  NITROSTAT Place 1 tablet (0.4 mg total) under the tongue every 5 (five) minutes as needed for chest pain.   polyethylene glycol packet Commonly known as:  MIRALAX / GLYCOLAX Take 17 g by mouth daily as needed for mild constipation, moderate constipation or severe constipation. 1 capful daily as needed   SYSTANE 0.4-0.3 % Soln Generic drug:  Polyethyl Glycol-Propyl Glycol Apply 1-2 drops to eye 3 (three) times daily as needed (for dry eyes).   TOUJEO SOLOSTAR 300 UNIT/ML Sopn Generic drug:  Insulin Glargine Inject 26 Units into the skin every morning.   traMADol 50 MG tablet Commonly known as:  ULTRAM Take 50 mg by mouth every 6 (six) hours as needed for moderate pain.   vitamin B-12 1000 MCG tablet Commonly known as:  CYANOCOBALAMIN Take 1,000 mcg by mouth daily.       Allergies:  Allergies  Allergen Reactions  . Penicillins Swelling    Eyes and lips Has patient had a PCN reaction causing immediate rash, facial/tongue/throat swelling, SOB or lightheadedness with hypotension: Yes Has patient had a PCN reaction causing severe rash involving mucus membranes or skin necrosis: No Has patient had a PCN reaction that required hospitalization No Has patient had a PCN reaction occurring within the last 10 years: No If all of the above answers are "NO", then may proceed with Cephalosporin use   . Prednisone Other (See Comments)    Hyperglycemia and AMS     Past Medical History:  Diagnosis Date  . Anemia    hx of year ago no problems now per pt   . Arthritis    generalized   . Asthma   . Atypical chest pain   . Bladder neck contracture   . Chronic back pain   . Complication of anesthesia    POST OP HYPOTENSION  . Dementia    per wife pt had neurological evaluation  . Diverticulosis of colon   . First degree heart block   . GERD (gastroesophageal reflux disease)   . Heart murmur   . History of basal cell carcinoma excision    forehead and x6 area forearms  . History of Bell's palsy    x2  1998 &  2004 left side --  residual slight left eye droop and vace  . History of chronic bronchitis   . History of colon polyps   . History of gout   . History of hypertension   . History of MI (myocardial infarction)    08/ 2010  secondary acute renal failure/ dehydration--  medical management  . History of pulmonary embolus (PE)    2010  . Hypogonadism male   . Hypotension   . Hypothyroidism   . OSA (obstructive sleep apnea)    severe per study 2007/  non-compliant cpap  . Peripheral vascular disease ([place])   . Prostate carcinoma [facility]) urologist-  dr Gaynelle Arabian   DX 2007  Gleason 6  and recurrent 2012  s/p  cryoablation of prostate both times  . RBBB (right bundle branch block)   . Recovering alcoholic in remission ([place])   . Renal calculus, bilateral   . Type 2 diabetes mellitus ([place])     Past Surgical History:  Procedure Laterality Date  . CARDIAC CATHETERIZATION N/A 11/27/2015  Procedure: Left Heart Cath and Coronary Angiography;  Surgeon: Peter M [name], MD;  Location: [place] [facility];  Service: Cardiovascular;  Laterality: N/A;  . CARDIOVASCULAR STRESS TEST  05-19-2012  dr Tressia Miners turner   normal perfusionstudy/  no ischemia/  attenuation artifact inferoapex region of myocardium/  note gate secondary to rhythm irregularity  . CARDIOVERSION N/A 01/05/2016   Procedure: CARDIOVERSION;  Surgeon: Sanda Klein, MD;  Location:  [place];  Service: Cardiovascular;  Laterality: N/A;  . CATARACT EXTRACTION W/ INTRAOCULAR LENS IMPLANT Right 2014  . COLONOSCOPY W/ POLYPECTOMY  last one 08-10-2013  . CORONARY ARTERY BYPASS GRAFT N/A 11/29/2015   Procedure: CORONARY ARTERY BYPASS GRAFTING (CABG), ON PUMP, TIMES FIVE, USING LEFT INTERNAL MAMMARY ARTERY, RIGHT GREATER SAPHENOUS VEIN HARVESTED ENDOSCOPICALLY;  Surgeon: [name], MD;  Location: [place];  Service: Open Heart Surgery;  Laterality: N/A;  LIMA-LAD; SEQ SVG-OM1-OM2;SVG-DIAG; SVG-PL  . CYSTOSCOPY WITH URETHRAL DILATATION N/A 01/14/2014   Procedure: URETHRAL MEATAL DILATATION;  Surgeon: Ailene Rud, MD;  Location[facility];  Service: Urology;  Laterality: N/A;  . EXCISION BENIGN SKIN LESION OF BACK  10/ 2011  . HIATAL HERNIA REPAIR  1983  . INGUINAL HERNIA REPAIR Right 10-14-2001  . INTRAOPERATIVE TRANSESOPHAGEAL ECHOCARDIOGRAM N/A 11/29/2015   Procedure: INTRAOPERATIVE TRANSESOPHAGEAL ECHOCARDIOGRAM;  Surgeon: [name], MD;  Location: [place];  Service: Open Heart Surgery;  Laterality: N/A;  . KNEE ARTHROSCOPY W/ MENISCECTOMY Right 01-25-2003   and chondroplasty  . [place]  . PROSTATE CRYOABLATION  09-17-2010  &  04-12-2005  . REPAIR RIGHT ARM TENDON INJURY  1975  . SHOULDER OPEN ROTATOR CUFF REPAIR  03/20/2011   Procedure: ROTATOR CUFF REPAIR SHOULDER OPEN;  Surgeon: Tobi Bastos;  Location: [facility];  Service: Orthopedics;  Laterality: Right;  . TEE WITHOUT CARDIOVERSION N/A 01/05/2016   Procedure: TRANSESOPHAGEAL ECHOCARDIOGRAM (TEE);  Surgeon: Sanda Klein, MD;  Location: [facility];  Service: Cardiovascular;  Laterality: N/A;  . TONSILLECTOMY  as child  . TRANSTHORACIC ECHOCARDIOGRAM  01-11-2013  dr [name]   moderate LVH/  grade I diastolic dysfunction/ ef [id number]  mild AV stenosis /  mild MR/  mild LAE  . TRANSURETHRAL RESECTION OF BLADDER TUMOR N/A 01/14/2014   Procedure: TRANSURETHRAL  INCISION OF BLADDER NECK CONTRACTURE;  Surgeon: Ailene Rud, MD;  Location: [facility];  Service: Urology;  Laterality: N/A;  . Vantus Implant     Hormones for Prostate  . VENTRAL HERNIA REPAIR  1998    Family History  Problem Relation Age of Onset  . Heart disease Mother   . Stroke Father   . Cancer Brother     unsure of type    Social History:  reports that he has never smoked. He has never used smokeless tobacco. He reports that he does not drink alcohol or use drugs.  Review of Systems  His chief complaint today is generalized pain including in his legs which is worse now. He was reportedly told by his cardiologist or origin not to take any oxycodone with his Eliquis and is not taking much pain medication now This is causing some difficulty sleep and fatigue during the day  He has sporadic headaches, some in the right temple area which he does not think his consistent with occasionally a significant but not long lasting, not associated with any other symptoms  B-12 deficiency:He has been on B12 tablets 1000 mg daily Has had some  anemia persisting since his surgery  He has  COPD with chronic cough, treated by the pulmonologist and is taking steroid inhaler He thinks that he does not get relief now and the only medication that helps him is Robitussin Generic cough syrup which he was supposedly told not to take   HYPERLIPIDEMIA: Well controlled with Crestor   Lab Results  Component Value Date   CHOL 117 10/31/2015   HDL 44.60 10/31/2015   LDLCALC 53 10/31/2015   TRIG 98.0 10/31/2015   CHOLHDL 3 10/31/2015     He was diagnosed have had sleep apnea but he Declined CPAP    HYPERTENSION: Blood pressure has been higher than usual in the last few weeks and recently may be high because of increased pain However his potassium is high  History of mild hypothyroidism which is Usually well controlled  on 112 g   Not clear why his TSH is lower now, he  has been getting this regularly    Lab Results  Component Value Date   TSH 0.18 (L) 01/23/2016   ANEMIA: This is somewhat Persistent, recently taking iron in the evenings Iron saturation still low at 14%   Lab Results  Component Value Date   WBC 8.8 01/23/2016   HGB 11.6 (L) 01/23/2016   HCT 36.7 (L) 01/23/2016   MCV 85.3 01/23/2016   PLT 188.0 01/23/2016       Examination:   BP 138/62   Wt 184 lb (83.5 kg)   SpO2 96%   BMI 25.66 kg/m   Body mass index is 25.66 kg/m.      Assesment/Plan:  Diabetes type 2, uncontrolled  See history of present illness for detailed discussion of his current management, blood sugar patterns and problems identified  Blood sugars are still poorly controlled Again has tendency to progressively high readings in the afternoons and evenings which is partly related to inconsistent diet and getting sweets and high carbohydrate snacks in the afternoon He and his wife are concerned about overnight hypoglycemia and fasting readings are relatively lower recently  Again he has sporadically low sugars including once overnight His wife says that he is still not able to watch her diet with eating sweets especially in the afternoons  Discussed need to be consistent with diet and avoid high carbohydrate meals and sweets He can use glucose tablets or juice for taking a low sugars and avoid keeping unnecessary items at home Insulin dose changes: TOUJEO will be reduced to 20 units and he can take 26 units at his first meal also without reducing  suppertime dose to keep postprandial readings controlled in the evening He will call if he has any questions  ANEMIA:  mild but stable, continue iron supplements    HYPOTHYROIDISM: TSH is suppressed and will have him take the Synthroid 6 days a week   Chronic pain: Advised him to go back to Percocet and explained that he has no drug interactions with this and other drugs  Headaches: Intermittent and may be  related to frontal sinus congestion but would like neurology  evaluation, he refuses to do this  Chronic cough/bronchitis: He can take Robitussin as needed since he feels the benefits   HYPERKALEMIA: He will reduce his lisinopril to half tablet daily and will continue to monitor his electrolytes  Patient Instructions  Toujeo 20 units   Humalog 26 twice daily at meals  LISINOPRIL 40MG , 1/2 tab daily  For low sugars give 3-4 glucose tabs  Thyroid pill: skip Sundays  May take Tussin       Counseling time on subjects discussed above is over 50% of today's 25 minute visit   Jordis Repetto 01/28/2016, 8:53 PM

## 2016-01-26 NOTE — Patient Instructions (Addendum)
Toujeo 20 units   Humalog 26 twice daily at meals  LISINOPRIL 40MG , 1/2 tab daily  For low sugars give 3-4 glucose tabs  Thyroid pill: skip Sundays  May take Tussin

## 2016-01-30 ENCOUNTER — Other Ambulatory Visit: Payer: Self-pay | Admitting: Physician Assistant

## 2016-01-30 ENCOUNTER — Other Ambulatory Visit: Payer: Self-pay | Admitting: Cardiovascular Disease

## 2016-01-30 MED ORDER — APIXABAN 5 MG PO TABS
5.0000 mg | ORAL_TABLET | Freq: Two times a day (BID) | ORAL | 1 refills | Status: DC
Start: 2016-01-30 — End: 2016-04-01

## 2016-01-31 ENCOUNTER — Other Ambulatory Visit: Payer: Self-pay | Admitting: Physician Assistant

## 2016-02-06 ENCOUNTER — Other Ambulatory Visit: Payer: Self-pay | Admitting: *Deleted

## 2016-02-06 ENCOUNTER — Other Ambulatory Visit: Payer: Self-pay | Admitting: Physician Assistant

## 2016-02-06 ENCOUNTER — Other Ambulatory Visit: Payer: Self-pay | Admitting: Endocrinology

## 2016-02-06 NOTE — Telephone Encounter (Signed)
Per call from patients wife patient needs to have this medication refilled by Dr [name]. She stated that the patient one half tablet qd. Okay to refill?

## 2016-02-07 ENCOUNTER — Telehealth: Payer: Self-pay | Admitting: Endocrinology

## 2016-02-07 NOTE — Telephone Encounter (Signed)
Please print it: oxyCODONE-acetaminophen 5-325 MG tablet  Take 1 tablet by mouth every 8 (eight) hours as needed for severe pain.  60 tablets

## 2016-02-07 NOTE — Telephone Encounter (Signed)
Pt wife called in requesting his refill of Oxycodone.

## 2016-02-08 ENCOUNTER — Other Ambulatory Visit: Payer: Self-pay

## 2016-02-08 MED ORDER — LISINOPRIL 40 MG PO TABS: 40.0000 mg | ORAL_TABLET | Freq: Every day | ORAL | 1 refills | Status: DC

## 2016-02-09 ENCOUNTER — Other Ambulatory Visit: Payer: Self-pay

## 2016-02-09 MED ORDER — OXYCODONE-ACETAMINOPHEN 5-325 MG PO TABS: 1.0000 | ORAL_TABLET | Freq: Three times a day (TID) | ORAL | 0 refills | Status: DC | PRN

## 2016-02-09 NOTE — Telephone Encounter (Signed)
Ordered 02/09/16

## 2016-02-20 ENCOUNTER — Telehealth: Payer: Self-pay | Admitting: Endocrinology

## 2016-02-20 NOTE — Telephone Encounter (Signed)
Sunday BS went up to 584 and yesterday it was 139 in AM and 239 in PM Please advise

## 2016-02-20 NOTE — Telephone Encounter (Signed)
Is he having blood sugars over 200 consistently at any given time?

## 2016-02-21 NOTE — Telephone Encounter (Signed)
Pl call

## 2016-02-21 NOTE — Telephone Encounter (Signed)
LM for pt to returncall

## 2016-02-23 NOTE — Telephone Encounter (Signed)
Spoke to patient and he says sugar readings are fine now none over 200 since sunday

## 2016-02-27 ENCOUNTER — Other Ambulatory Visit: Payer: Self-pay | Admitting: Endocrinology

## 2016-03-12 ENCOUNTER — Other Ambulatory Visit: Payer: Self-pay | Admitting: Endocrinology

## 2016-03-15 ENCOUNTER — Telehealth: Payer: Self-pay | Admitting: Endocrinology

## 2016-03-15 MED ORDER — OXYCODONE-ACETAMINOPHEN 5-325 MG PO TABS: 1.0000 | ORAL_TABLET | Freq: Three times a day (TID) | ORAL | 0 refills | Status: DC | PRN

## 2016-03-15 NOTE — Telephone Encounter (Signed)
Patient need refill of oxyCODONE-acetaminophen (ROXICET) 5-325 MG tablet

## 2016-03-15 NOTE — Telephone Encounter (Signed)
I contacted the patient and advised rx is ready for pick up. Rx placed upfront for pick up.

## 2016-03-20 NOTE — Progress Notes (Signed)
Cardiology Office Note    Date:  03/21/2016   ID:  Barry Officer Sr., DOB [birth year], MRN [id number]  PCP:  [name], MD  Cardiologist:  Dr. [name]   CC: follow up- atrial flutter s/p DCCV  History of Present Illness:  Barry JOSWICK Sr. is a 83 y.o. male with a history of HLD, DMT2, HTN, OSA, PVD, trifascicular block and recently diagnosed CAD s/p CABG x5V with post op atrial flutter who presents to clinic for follow up after recent DCCV.   Admitted from 9/16-9/28/17. He presented with chest pain and ruled in for NSTEMI (pk trop 1.62). Cath showed critical 3 vessel and left main CAD and he was referred for CABG. Echo results showed mild AS, mild MR and LVEF 45-50%. He underwent a CABG x 5 on 11/29/2015. Post op course was c/b  atrial flutter and volume overload requiring diuresis. Given AV dissociation and history of trifascicular block, amiodarone and other AV nodal blocking agents were avoided. He was rate-controlled due to intrinsic AV block. Cardiology recommend anticoagulation with Eliquis 5mg  BID and outpatient cardioversion attempt if he did not spontaneously convert in 3 weeks.   TEE/DCCV on 01/05/16. He was successfully cardioverted back into sinus with 1st degree block and occasional Mobitz I second degree, but not bradycardic.  Today he presents to clinic for follow up. He has been feeling better with less fatigue. His biggest problem is arthritis. No LE edema, orthopnea or PND. No dizziness or syncope. No chest pain, SOB or palpitations. He has had bronchial trouble his whole life. No cigarettes.    Past Medical History:  Diagnosis Date  . Anemia    hx of year ago no problems now per pt   . Arthritis    generalized   . Asthma   . Atypical chest pain   . Bladder neck contracture   . Chronic back pain   . Complication of anesthesia    POST OP HYPOTENSION  . Dementia    per wife pt had neurological evaluation  . Diverticulosis of colon   . First degree heart block   .  GERD (gastroesophageal reflux disease)   . Heart murmur   . History of basal cell carcinoma excision    forehead and x6 area forearms  . History of Bell's palsy    x2  1998 &  2004 left side --  residual slight left eye droop and vace  . History of chronic bronchitis   . History of colon polyps   . History of gout   . History of hypertension   . History of MI (myocardial infarction)    08/ 2010  secondary acute renal failure/ dehydration--  medical management  . History of pulmonary embolus (PE)    2010  . Hypogonadism male   . Hypotension   . Hypothyroidism   . OSA (obstructive sleep apnea)    severe per study 2007/  non-compliant cpap  . Peripheral vascular disease ([place])   . Prostate carcinoma [facility] urologist-  dr Gaynelle Arabian   DX 2007  Gleason 6  and recurrent 2012  s/p  cryoablation of prostate both times  . RBBB (right bundle branch block)   . Recovering alcoholic in remission ([place])   . Renal calculus, bilateral   . Type 2 diabetes mellitus ([place])     Past Surgical History:  Procedure Laterality Date  . CARDIAC CATHETERIZATION N/A 11/27/2015   Procedure: Left Heart Cath and Coronary Angiography;  Surgeon: Toua Stites M [name], MD;  Location: [place] [facility];  Service: Cardiovascular;  Laterality: N/A;  . CARDIOVASCULAR STRESS TEST  05-19-2012  dr Tressia Miners turner   normal perfusionstudy/  no ischemia/  attenuation artifact inferoapex region of myocardium/  note gate secondary to rhythm irregularity  . CARDIOVERSION N/A 01/05/2016   Procedure: CARDIOVERSION;  Surgeon: Sanda Klein, MD;  Location: [place];  Service: Cardiovascular;  Laterality: N/A;  . CATARACT EXTRACTION W/ INTRAOCULAR LENS IMPLANT Right 2014  . COLONOSCOPY W/ POLYPECTOMY  last one 08-10-2013  . CORONARY ARTERY BYPASS GRAFT N/A 11/29/2015   Procedure: CORONARY ARTERY BYPASS GRAFTING (CABG), ON PUMP, TIMES FIVE, USING LEFT INTERNAL MAMMARY ARTERY, RIGHT GREATER SAPHENOUS VEIN HARVESTED ENDOSCOPICALLY;  Surgeon:  [name], MD;  Location: [place];  Service: Open Heart Surgery;  Laterality: N/A;  LIMA-LAD; SEQ SVG-OM1-OM2;SVG-DIAG; SVG-PL  . CYSTOSCOPY WITH URETHRAL DILATATION N/A 01/14/2014   Procedure: URETHRAL MEATAL DILATATION;  Surgeon: Ailene Rud, MD;  Location: [facility];  Service: Urology;  Laterality: N/A;  . EXCISION BENIGN SKIN LESION OF BACK  10/ 2011  . HIATAL HERNIA REPAIR  1983  . INGUINAL HERNIA REPAIR Right 10-14-2001  . INTRAOPERATIVE TRANSESOPHAGEAL ECHOCARDIOGRAM N/A 11/29/2015   Procedure: INTRAOPERATIVE TRANSESOPHAGEAL ECHOCARDIOGRAM;  Surgeon: [name], MD;  Location: [place];  Service: Open Heart Surgery;  Laterality: N/A;  . KNEE ARTHROSCOPY W/ MENISCECTOMY Right 01-25-2003   and chondroplasty  . [place]  . PROSTATE CRYOABLATION  09-17-2010  &  04-12-2005  . REPAIR RIGHT ARM TENDON INJURY  1975  . SHOULDER OPEN ROTATOR CUFF REPAIR  03/20/2011   Procedure: ROTATOR CUFF REPAIR SHOULDER OPEN;  Surgeon: Tobi Bastos;  Location: [facility];  Service: Orthopedics;  Laterality: Right;  . TEE WITHOUT CARDIOVERSION N/A 01/05/2016   Procedure: TRANSESOPHAGEAL ECHOCARDIOGRAM (TEE);  Surgeon: Sanda Klein, MD;  Location: [facility];  Service: Cardiovascular;  Laterality: N/A;  . TONSILLECTOMY  as child  . TRANSTHORACIC ECHOCARDIOGRAM  01-11-2013  dr [name]   moderate LVH/  grade I diastolic dysfunction/ ef [id number]  mild AV stenosis /  mild MR/  mild LAE  . TRANSURETHRAL RESECTION OF BLADDER TUMOR N/A 01/14/2014   Procedure: TRANSURETHRAL INCISION OF BLADDER NECK CONTRACTURE;  Surgeon: Ailene Rud, MD;  Location: [facility];  Service: Urology;  Laterality: N/A;  . Vantus Implant     Hormones for Prostate  . VENTRAL HERNIA REPAIR  1998    Current Medications: Outpatient Medications Prior to Visit  Medication Sig Dispense Refill  . acetaminophen (TYLENOL) 500 MG tablet Take 500 mg by mouth every 8  (eight) hours as needed for mild pain, moderate pain or headache.     . albuterol (PROVENTIL HFA;VENTOLIN HFA) 108 (90 Base) MCG/ACT inhaler Inhale 2 puffs into the lungs every 6 (six) hours. 1 Inhaler 1  . apixaban (ELIQUIS) 5 MG TABS tablet Take 1 tablet (5 mg total) by mouth 2 (two) times daily. 60 tablet 1  . aspirin EC 81 MG tablet Take 81 mg by mouth daily.     . budesonide-formoterol (SYMBICORT) 160-4.5 MCG/ACT inhaler Inhale 2 puffs into the lungs 2 (two) times daily. 3 Inhaler 3  . CRESTOR 10 MG tablet TAKE ONE TABLET BY MOUTH ONCE DAILY 90 tablet 1  . ferrous sulfate 325 (65 FE) MG EC tablet Take 325 mg by mouth daily with breakfast.    . finasteride (PROSCAR) 5 MG tablet Take 5 mg by mouth every morning.     Marland Kitchen  gabapentin (NEURONTIN) 300 MG capsule TAKE 2 CAPSULES BY MOUTH 3 TIMES DAILY 540 capsule 1  . glucose blood (ONE TOUCH ULTRA TEST) test strip Use as instructed to check blood sugar 3 times per day dx code E11.65 100 each 5  . levothyroxine (SYNTHROID, LEVOTHROID) 112 MCG tablet Take 112 mcg by mouth as directed.     . menthol-thymol (ABSORBINE JR) LIQD Apply 1 application topically as needed. For pain.    . metFORMIN (GLUCOPHAGE) 500 MG tablet Take 1,000 mg by mouth daily with supper.    Marland Kitchen oxyCODONE-acetaminophen (ROXICET) 5-325 MG tablet Take 1 tablet by mouth every 8 (eight) hours as needed for severe pain. 60 tablet 0  . Polyethyl Glycol-Propyl Glycol (SYSTANE) 0.4-0.3 % SOLN Apply 1-2 drops to eye 3 (three) times daily as needed (for dry eyes).    . polyethylene glycol (MIRALAX / GLYCOLAX) packet Take 17 g by mouth daily as needed for mild constipation, moderate constipation or severe constipation. 1 capful daily as needed    . traMADol (ULTRAM) 50 MG tablet Take 50 mg by mouth every 6 (six) hours as needed for moderate pain.    Marland Kitchen triamcinolone (NASACORT AQ) 55 MCG/ACT AERO nasal inhaler Place 1-2 sprays into the nose daily.     . vitamin B-12 (CYANOCOBALAMIN) 1000 MCG tablet  Take 1,000 mcg by mouth daily.    Marland Kitchen HUMALOG MIX 75/25 KWIKPEN (75-25) 100 UNIT/ML Kwikpen INJECT 20 UNITS SUBCUTANEOUSLY IN THE MORNING AND 30 UNITS IN THE EVENING (Patient taking differently: INJECT 26 UNITS SUBCUTANEOUSLY IN THE MORNING AND 26 UNITS IN THE EVENING) 45 mL 0  . Insulin Glargine (TOUJEO SOLOSTAR) 300 UNIT/ML SOPN Inject 26 Units into the skin every morning.    . insulin lispro protamine-lispro (HUMALOG 75/25 MIX) (75-25) 100 UNIT/ML SUSP injection Inject 20 units subcutaneously every morning and inject 26 units subcutaneously in the evening daily    . levothyroxine (SYNTHROID, LEVOTHROID) 112 MCG tablet TAKE ONE TABLET BY MOUTH ONCE DAILY 90 tablet 0  . lisinopril (PRINIVIL,ZESTRIL) 40 MG tablet Take 1 tablet (40 mg total) by mouth daily. 90 tablet 1  . metFORMIN (GLUCOPHAGE-XR) 500 MG 24 hr tablet TAKE THREE TABLETS BY MOUTH ONCE DAILY WITH  SUPPER 90 tablet 5  . TOUJEO SOLOSTAR 300 UNIT/ML SOPN INJECT 42 UNITS SUBCUTANEOUSLY EVERY MORNING 6 pen 3  . nitroGLYCERIN (NITROSTAT) 0.4 MG SL tablet Place 1 tablet (0.4 mg total) under the tongue every 5 (five) minutes as needed for chest pain. 25 tablet 3   No facility-administered medications prior to visit.      Allergies:   Penicillins and Prednisone   Social History   Social History  . Marital status: Married    Spouse name: N/A  . Number of children: N/A  . Years of education: N/A   Occupational History  . Retired      Personal assistant    Social History Main Topics  . Smoking status: Never Smoker  . Smokeless tobacco: Never Used  . Alcohol use No     Comment: RECOVERING ALCOHOLIC none since [id number]   . Drug use: No  . Sexual activity: Not Currently   Other Topics Concern  . None   Social History Narrative  . None     Family History:  The patient's family history includes Cancer in his brother; Heart disease in his mother; Stroke in his father.     ROS All other systems reviewed and are negative.   PHYSICAL EXAM:    VS:  BP 120/60   Pulse 76   Ht 5\' 11"  (1.803 m)   Wt 187 lb 12.8 oz (85.2 kg)   SpO2 98%   BMI 26.19 kg/m    GEN: Well nourished, well developed, in no acute distress  HEENT: normal  Neck: no JVD, carotid bruits, or masses Cardiac: irreg irreg; no murmurs, rubs, or gallops,no edema  Post sternotomy  Respiratory:  rhoncorous on auscultation bilaterally, normal work of breathing GI: soft, nontender, nondistended, + BS MS: no deformity or atrophy  Skin: warm and dry, no rash Neuro:  Alert and Oriented x 3, Strength and sensation are intact Psych: euthymic mood, full affect LE:  Post venectomy RLE   Wt Readings from Last 3 Encounters:  03/21/16 187 lb 12.8 oz (85.2 kg)  01/26/16 184 lb (83.5 kg)  01/16/16 184 lb (83.5 kg)      Studies/Labs Reviewed:   EKG:     01/10/16 SR first degree RBBB rate 71   Recent Labs: 11/25/2015: B Natriuretic Peptide 1,139.9 11/30/2015: Magnesium 2.2 01/23/2016: ALT 7; BUN 19; Creatinine, Ser 1.21; Hemoglobin 11.6; Platelets 188.0; Potassium 5.2; Sodium 141; TSH 0.18   Lipid Panel    Component Value Date/Time   CHOL 117 10/31/2015 1340   TRIG 98.0 10/31/2015 1340   HDL 44.60 10/31/2015 1340   CHOLHDL 3 10/31/2015 1340   VLDL 19.6 10/31/2015 1340   LDLCALC 53 10/31/2015 1340    Additional studies/ records that were reviewed today include:  Left Heart Cath and Coronary Angiography by Dr. [name] on 11/27/2015:  Conclusion     Ost LM to LM lesion, 80 %stenosed.  Prox LAD lesion, 80 %stenosed.  Mid LAD lesion, 90 %stenosed.  Dist LAD lesion, 95 %stenosed.  Ost 1st Diag to 1st Diag lesion, 100 %stenosed.  Ost Ramus lesion, 90 %stenosed.  Ost Cx to Prox Cx lesion, 90 %stenosed.  Ost 1st Mrg to 1st Mrg lesion, 80 %stenosed.  Prox Cx to Mid Cx lesion, 100 %stenosed.  Prox RCA lesion, 95 %stenosed.  Dist RCA lesion, 90 %stenosed.  1. Critical 3 vessel and left main CAD 2. Mildly elevated LV EDP 3. No significant AV gradient  by pullback.    Coronary artery bypass grafting x5 with left internal mammary to the left anterior descending coronary artery reverse saphenous vein graft to the diagonal coronary artery, sequential reverse saphenous vein graft to the first and second obtuse marginal reverse saphenous vein graft to the posterior lateral branch of the right coronary artery with right thigh and calf greater saphenous vein harvesting endoscopically by Dr. Servando Snare on 11/29/2015.   2D ECHO: 11/28/2015 LV EF: 45% -   50% Study Conclusions - Left ventricle: The cavity size was normal. There was severe   concentric hypertrophy. Systolic function was mildly reduced. The   estimated ejection fraction was in the range of 45% to 50%.   Hypokinesis of the apical, mid-apical anteroseptal and   inferoseptal myocardium, and apical inferolateral, and inferior   myocardium. Doppler parameters are consistent with a reversible   restrictive pattern, indicative of decreased left ventricular   diastolic compliance and/or increased left atrial pressure (grade   3 diastolic dysfunction). - Aortic valve: Trileaflet; moderately thickened, moderately   calcified leaflets. Valve mobility was restricted. There was mild   stenosis. There was no regurgitation. Valve area (VTI): 0.88   cm^2. Valve area (Vmax): 0.96 cm^2. Valve area (Vmean): 0.86   cm^2. - Mitral valve: Calcified annulus. Transvalvular velocity was   within the normal  range. There was no evidence for stenosis.   There was mild regurgitation. - Left atrium: The atrium was severely dilated. - Right ventricle: The cavity size was normal. Wall thickness was   normal. Systolic function was normal. - Atrial septum: A patent foramen ovale cannot be excluded. - Tricuspid valve: There was mild regurgitation. Impressions: - Aortic valve appears heavily calcified and thickend. There   appears to be mild aortic stenosis by peak velocity and mean   gradients. However,  given reduced systolic function, cannot rule   out more severe valvular heart disease or &quot;low flow, low   gradient&quot; aortic stenosis.   ASSESSMENT & PLAN:   CAD: LM 3VD seen by s/p CABG x5V  September 2017 . Continue ASA and statin. Refer to cardiac rehab  Post op atrial flutter s/p DCCV 01/05/16 : now it Mobitz type I.  -- No AV nodal blocking agents. Continue Eliquis 5mg  BID for CHADSVASC of 6 (CHF, HTN, age, DM, CAD).   Bifascicular block, Mobitz type I: significant conduction abnormality seen on EKG, avoiding Beta blockers. No syncope. Continue to monitor  Chronic kidney disease stage III: creat stable  Lab Results  Component Value Date   CREATININE 1.21 01/23/2016   BUN 19 01/23/2016   NA 141 01/23/2016   K 5.2 (H) 01/23/2016   CL 109 01/23/2016   CO2 25 01/23/2016     Hyperlipidemia: LDL 53. Continue Crestor 10mg  daily.   Diabetes with nephropathy: continue current regimen and follow up with Dr. Dwyane Dee  HTN: BP well controlled on current regimen   Jenkins Rouge

## 2016-03-21 ENCOUNTER — Ambulatory Visit (INDEPENDENT_AMBULATORY_CARE_PROVIDER_SITE_OTHER): Payer: Medicare Other | Admitting: Cardiovascular Disease

## 2016-03-21 ENCOUNTER — Telehealth (HOSPITAL_COMMUNITY): Payer: Self-pay | Admitting: Cardiovascular Disease

## 2016-03-21 ENCOUNTER — Encounter: Payer: Self-pay | Admitting: Cardiovascular Disease

## 2016-03-21 ENCOUNTER — Other Ambulatory Visit (HOSPITAL_COMMUNITY): Payer: Medicare Other

## 2016-03-21 VITALS — BP 120/60 | HR 76 | Ht 71.0 in | Wt 187.8 lb

## 2016-03-21 DIAGNOSIS — R011 Cardiac murmur, unspecified: Secondary | ICD-10-CM

## 2016-03-21 DIAGNOSIS — I4892 Unspecified atrial flutter: Secondary | ICD-10-CM

## 2016-03-21 DIAGNOSIS — Z951 Presence of aortocoronary bypass graft: Secondary | ICD-10-CM

## 2016-03-21 NOTE — Patient Instructions (Addendum)
Medication Instructions:  Your physician recommends that you continue on your current medications as directed. Please refer to the Current Medication list given to you today.  Labwork: NONE  Testing/Procedures: NONE  Follow-Up: Your physician wants you to follow-up in: 3 months with Dr. [name]. You will receive a reminder letter in the mail two months in advance. If you don't receive a letter, please call our office to schedule the follow-up appointment.  You have been referred to cardiac rehab their office will call you with an appointment.   If you need a refill on your cardiac medications before your next appointment, please call your pharmacy.

## 2016-03-21 NOTE — Telephone Encounter (Signed)
03/21/16 Called pt and lmsg for him to arrive at 12:00pm for his echo .Tech has a doctor's appt at 1:00pm and wanted to get him in sooner rather than rescheduling him. I asked that he call back to confirm    By Verdene Rio     After Barry Mcdaniel spoke with Dr. [name], his recommendation was that the patient did not need the test considering he just had an TEE in october

## 2016-03-22 ENCOUNTER — Other Ambulatory Visit (INDEPENDENT_AMBULATORY_CARE_PROVIDER_SITE_OTHER): Payer: Medicare Other

## 2016-03-22 DIAGNOSIS — E063 Autoimmune thyroiditis: Secondary | ICD-10-CM

## 2016-03-22 DIAGNOSIS — E038 Other specified hypothyroidism: Secondary | ICD-10-CM | POA: Diagnosis not present

## 2016-03-22 DIAGNOSIS — Z794 Long term (current) use of insulin: Secondary | ICD-10-CM

## 2016-03-22 DIAGNOSIS — D509 Iron deficiency anemia, unspecified: Secondary | ICD-10-CM | POA: Diagnosis not present

## 2016-03-22 DIAGNOSIS — E1165 Type 2 diabetes mellitus with hyperglycemia: Secondary | ICD-10-CM

## 2016-03-22 LAB — COMPREHENSIVE METABOLIC PANEL
ALT: 9 U/L (ref 0–53)
AST: 13 U/L (ref 0–37)
Albumin: 3.5 g/dL (ref 3.5–5.2)
Alkaline Phosphatase: 69 U/L (ref 39–117)
BILIRUBIN TOTAL: 0.4 mg/dL (ref 0.2–1.2)
BUN: 19 mg/dL (ref 6–23)
CALCIUM: 9.1 mg/dL (ref 8.4–10.5)
CO2: 26 meq/L (ref 19–32)
CREATININE: 1.04 mg/dL (ref 0.40–1.50)
Chloride: 113 mEq/L — ABNORMAL HIGH (ref 96–112)
GFR: 72.84 mL/min (ref >60.00)
Glucose, Bld: 125 mg/dL — ABNORMAL HIGH (ref 70–99)
Potassium: 5.1 mEq/L (ref 3.5–5.1)
SODIUM: 144 meq/L (ref 135–145)
Total Protein: 6.2 g/dL (ref 6.0–8.3)

## 2016-03-22 LAB — CBC
HEMATOCRIT: 34.3 % — AB (ref 39.0–52.0)
Hemoglobin: 11 g/dL — ABNORMAL LOW (ref 13.0–17.0)
MCHC: 32.1 g/dL (ref 30.0–36.0)
MCV: 84.9 fl (ref 78.0–100.0)
Platelets: 146 10*3/uL — ABNORMAL LOW (ref 150.0–400.0)
RBC: 4.04 Mil/uL — AB (ref 4.22–5.81)
RDW: 18.5 % — AB (ref 11.5–15.5)
WBC: 9.5 10*3/uL (ref 4.0–10.5)

## 2016-03-22 LAB — HEMOGLOBIN A1C: Hgb A1c MFr Bld: 6.8 % — ABNORMAL HIGH (ref 4.6–6.5)

## 2016-03-22 LAB — T4, FREE: FREE T4: 1.16 ng/dL (ref 0.60–1.60)

## 2016-03-22 LAB — TSH: TSH: 0.46 u[IU]/mL (ref 0.35–4.50)

## 2016-03-26 ENCOUNTER — Telehealth (HOSPITAL_COMMUNITY): Payer: Self-pay | Admitting: Cardiovascular Disease

## 2016-03-26 NOTE — Telephone Encounter (Signed)
New Message:  Pt's wife called in stating that she will not be bringing in the patient assistance form in regards to the Eliquis because she feels that based on what the insurance is paying she is going to be able to pay for the meds.

## 2016-03-27 ENCOUNTER — Ambulatory Visit: Payer: Medicare Other | Admitting: Endocrinology

## 2016-04-01 ENCOUNTER — Telehealth: Payer: Self-pay | Admitting: Endocrinology

## 2016-04-01 ENCOUNTER — Other Ambulatory Visit: Payer: Self-pay

## 2016-04-01 ENCOUNTER — Other Ambulatory Visit: Payer: Self-pay | Admitting: Cardiovascular Disease

## 2016-04-01 NOTE — Telephone Encounter (Signed)
Pt needs his Humulog Pen refill sent into the [facility] on [address].

## 2016-04-04 ENCOUNTER — Ambulatory Visit (INDEPENDENT_AMBULATORY_CARE_PROVIDER_SITE_OTHER): Payer: Medicare Other | Admitting: Endocrinology

## 2016-04-04 ENCOUNTER — Encounter: Payer: Self-pay | Admitting: Endocrinology

## 2016-04-04 VITALS — BP 102/60 | HR 64 | Ht 71.0 in | Wt 189.0 lb

## 2016-04-04 DIAGNOSIS — E038 Other specified hypothyroidism: Secondary | ICD-10-CM

## 2016-04-04 DIAGNOSIS — D509 Iron deficiency anemia, unspecified: Secondary | ICD-10-CM | POA: Diagnosis not present

## 2016-04-04 DIAGNOSIS — R031 Nonspecific low blood-pressure reading: Secondary | ICD-10-CM

## 2016-04-04 DIAGNOSIS — Z794 Long term (current) use of insulin: Secondary | ICD-10-CM

## 2016-04-04 DIAGNOSIS — E1165 Type 2 diabetes mellitus with hyperglycemia: Secondary | ICD-10-CM

## 2016-04-04 DIAGNOSIS — E063 Autoimmune thyroiditis: Secondary | ICD-10-CM

## 2016-04-04 NOTE — Patient Instructions (Addendum)
Take 15 of the Humalog with mid afternoon snack and then reduce supper Humalog to 20 instead of 26  Vitamin C with iron  Stop Lisinopril

## 2016-04-04 NOTE — Progress Notes (Signed)
Patient ID: Barry Officer Sr., male   DOB: 23-Dec-1932, 83 y.o.   MRN: [id number]   Reason for Appointment: follow-up  of various issues   History of Present Illness   Diagnosis: Type 2 diabetes mellitus, date of diagnosis: 1997.   He has been on basal bolus insulin regimen for a few years.  His blood sugars are usually variably controlled and A1c is usually over 8%  except once was 7.2.  Previously had tried Victoza with some benefit but did not continue this because of perceived side effect of constipation and also out-of-pocket expense  RECENT history:   Insulin regimen: Toujeo insulin 20 units in am  Humalog mix insulin 20 units-26 acs   He has had inconsistent control of his diabetes since at least late 2015 Although his blood sugars improved  after changing Humalog to Humalog mix insulin before each meal in 2/16 he has had labile blood sugars  His A1c is fairly good at 6.8 despite his average blood sugar at home of 183  His blood sugars are showing the following patterns and has the following problems identified:  His Toujeo was reduced further on his last visit because of low-normal readings fasting  FASTING blood sugars are usually near normal now with periodic high readings.  His first rating of the day is anywhere between 10 AM-12 noon  No further nocturnal hypoglycemia  His main difficulty is now going off his diet in the afternoon and having a large snacks sometimes with milkshakes or sweets that will raise his blood sugar significantly before suppertime usually  Only when he is avoiding these snacks his blood sugar may be near normal at suppertime  Blood sugars are generally little higher after supper also  His wife thinks that he should not take any insulin before meals if his blood sugar is normal  He has gradually been gaining weight down  Still not very active physically  Monitors blood glucose: 1-2 times  a day.  Glucometer:  One Touch.   Glucose readings    Mean values apply above for all meters except median for One Touch  PRE-MEAL Fasting Lunch Dinner Bedtime Overall  Glucose range: 80-276   87-342  69-362    Mean/median: 126   212  218  183     Dietician visit: Most recent:1/14.  CDE visit 10/16   Exercise: a little walking  Wt Readings from Last 3 Encounters:  04/04/16 189 lb (85.7 kg)  03/21/16 187 lb 12.8 oz (85.2 kg)  01/26/16 184 lb (83.5 kg)   Lab Results  Component Value Date   HGBA1C 6.8 (H) 03/22/2016   HGBA1C 7.0 (H) 11/28/2015   HGBA1C 7.4 (H) 10/31/2015   Lab Results  Component Value Date   MICROALBUR 8.2 (H) 12/15/2015   LDLCALC 53 10/31/2015   CREATININE 1.04 03/22/2016    Other ACTIVE problems are discussed in review of systems    No visits with results within 1 Week(s) from this visit.  Latest known visit with results is:  Lab on 03/22/2016  Component Date Value Ref Range Status  . Sodium 03/22/2016 144  135 - 145 mEq/L Final  . Potassium 03/22/2016 5.1  3.5 - 5.1 mEq/L Final  . Chloride 03/22/2016 113* 96 - 112 mEq/L Final  . CO2 03/22/2016 26  19 - 32 mEq/L Final  . Glucose, Bld 03/22/2016 125* 70 - 99 mg/dL Final  . BUN 03/22/2016 19  6 - 23 mg/dL Final  . Creatinine, Ser 03/22/2016 1.04  0.40 - 1.50 mg/dL Final  . Total Bilirubin 03/22/2016 0.4  0.2 - 1.2 mg/dL Final  . Alkaline Phosphatase 03/22/2016 69  39 - 117 U/L Final  . AST 03/22/2016 13  0 - 37 U/L Final  . ALT 03/22/2016 9  0 - 53 U/L Final  . Total Protein 03/22/2016 6.2  6.0 - 8.3 g/dL Final  . Albumin 03/22/2016 3.5  3.5 - 5.2 g/dL Final  . Calcium 03/22/2016 9.1  8.4 - 10.5 mg/dL Final  . GFR 03/22/2016 72.84  >60.00 mL/min Final  . WBC 03/22/2016 9.5  4.0 - 10.5 K/uL Final  . RBC 03/22/2016 4.04* 4.22 - 5.81 Mil/uL Final  . Platelets 03/22/2016 146.0* 150.0 - 400.0 K/uL Final  . Hemoglobin 03/22/2016 11.0* 13.0 - 17.0 g/dL Final  . HCT 03/22/2016 34.3* 39.0 - 52.0 % Final  . MCV 03/22/2016 84.9  78.0 -  100.0 fl Final  . MCHC 03/22/2016 32.1  30.0 - 36.0 g/dL Final  . RDW 03/22/2016 18.5* 11.5 - 15.5 % Final  . TSH 03/22/2016 0.46  0.35 - 4.50 uIU/mL Final  . Free T4 03/22/2016 1.16  0.60 - 1.60 ng/dL Final   Comment: Specimens from patients who are undergoing biotin therapy and /or ingesting biotin supplements may contain high levels of biotin.  The higher biotin concentration in these specimens interferes with this Free T4 assay.  Specimens that contain high levels  of biotin may cause false high results for this Free T4 assay.  Please interpret results in light of the total clinical presentation of the patient.    . Hgb A1c MFr Bld 03/22/2016 6.8* 4.6 - 6.5 % Final    OTHER active problems addressed today are reviewed in review of systems   Allergies as of 04/04/2016      Reactions   Penicillins Swelling   Eyes and lips Has patient had a PCN reaction causing immediate rash, facial/tongue/throat swelling, SOB or lightheadedness with hypotension: Yes Has patient had a PCN reaction causing severe rash involving mucus membranes or skin necrosis: No Has patient had a PCN reaction that required hospitalization No Has patient had a PCN reaction occurring within the last 10 years: No If all of the above answers are "NO", then may proceed with Cephalosporin use   Prednisone Other (See Comments)   Hyperglycemia and AMS      Medication List       Accurate as of 04/04/16  2:59 PM. Always use your most recent med list.          acetaminophen 500 MG tablet Commonly known as:  TYLENOL Take 500 mg by mouth every 8 (eight) hours as needed for mild pain, moderate pain or headache.   albuterol 108 (90 Base) MCG/ACT inhaler Commonly known as:  PROVENTIL HFA;VENTOLIN HFA Inhale 2 puffs into the lungs every 6 (six) hours.   apixaban 5 MG Tabs tablet Commonly known as:  ELIQUIS Take 1 tablet (5 mg total) by mouth 2 (two) times daily.   aspirin EC 81 MG tablet Take 81 mg by mouth daily.     budesonide-formoterol 160-4.5 MCG/ACT inhaler Commonly known as:  SYMBICORT Inhale 2 puffs into the lungs 2 (two) times daily.   CRESTOR 10 MG tablet Generic drug:  rosuvastatin TAKE ONE TABLET BY MOUTH ONCE DAILY   ferrous sulfate 325 (65 FE) MG EC tablet Take 325 mg by mouth daily with breakfast.   finasteride 5  MG tablet Commonly known as:  PROSCAR Take 5 mg by mouth every morning.   gabapentin 300 MG capsule Commonly known as:  NEURONTIN TAKE 2 CAPSULES BY MOUTH 3 TIMES DAILY   glucose blood test strip Commonly known as:  ONE TOUCH ULTRA TEST Use as instructed to check blood sugar 3 times per day dx code E11.65   insulin lispro protamine-lispro (75-25) 100 UNIT/ML Susp injection Commonly known as:  HUMALOG 75/25 MIX Inject 26 Units into the skin 2 (two) times daily with a meal.   levothyroxine 112 MCG tablet Commonly known as:  SYNTHROID, LEVOTHROID Take 112 mcg by mouth as directed.   lisinopril 20 MG tablet Commonly known as:  PRINIVIL,ZESTRIL Take 20 mg by mouth daily.   menthol-thymol Liqd Apply 1 application topically as needed. For pain.   metFORMIN 500 MG tablet Commonly known as:  GLUCOPHAGE Take 1,000 mg by mouth daily with supper.   NASACORT AQ 55 MCG/ACT Aero nasal inhaler Generic drug:  triamcinolone Place 1-2 sprays into the nose daily.   nitroGLYCERIN 0.4 MG SL tablet Commonly known as:  NITROSTAT Place 1 tablet (0.4 mg total) under the tongue every 5 (five) minutes as needed for chest pain.   oxyCODONE-acetaminophen 5-325 MG tablet Commonly known as:  ROXICET Take 1 tablet by mouth every 8 (eight) hours as needed for severe pain.   polyethylene glycol packet Commonly known as:  MIRALAX / GLYCOLAX Take 17 g by mouth daily as needed for mild constipation, moderate constipation or severe constipation. 1 capful daily as needed   SYSTANE 0.4-0.3 % Soln Generic drug:  Polyethyl Glycol-Propyl Glycol Apply 1-2 drops to eye 3 (three) times daily  as needed (for dry eyes).   TOUJEO SOLOSTAR 300 UNIT/ML Sopn Generic drug:  Insulin Glargine Inject 20 Units into the skin daily.   traMADol 50 MG tablet Commonly known as:  ULTRAM Take 50 mg by mouth every 6 (six) hours as needed for moderate pain.   vitamin B-12 1000 MCG tablet Commonly known as:  CYANOCOBALAMIN Take 1,000 mcg by mouth daily.       Allergies:  Allergies  Allergen Reactions  . Penicillins Swelling    Eyes and lips Has patient had a PCN reaction causing immediate rash, facial/tongue/throat swelling, SOB or lightheadedness with hypotension: Yes Has patient had a PCN reaction causing severe rash involving mucus membranes or skin necrosis: No Has patient had a PCN reaction that required hospitalization No Has patient had a PCN reaction occurring within the last 10 years: No If all of the above answers are "NO", then may proceed with Cephalosporin use   . Prednisone Other (See Comments)    Hyperglycemia and AMS    Past Medical History:  Diagnosis Date  . Anemia    hx of year ago no problems now per pt   . Arthritis    generalized   . Asthma   . Atypical chest pain   . Bladder neck contracture   . Chronic back pain   . Complication of anesthesia    POST OP HYPOTENSION  . Dementia    per wife pt had neurological evaluation  . Diverticulosis of colon   . First degree heart block   . GERD (gastroesophageal reflux disease)   . Heart murmur   . History of basal cell carcinoma excision    forehead and x6 area forearms  . History of Bell's palsy    x2  1998 &  2004 left side --  residual slight  left eye droop and vace  . History of chronic bronchitis   . History of colon polyps   . History of gout   . History of hypertension   . History of MI (myocardial infarction)    08/ 2010  secondary acute renal failure/ dehydration--  medical management  . History of pulmonary embolus (PE)    2010  . Hypogonadism male   . Hypotension   . Hypothyroidism   .  OSA (obstructive sleep apnea)    severe per study 2007/  non-compliant cpap  . Peripheral vascular disease ([place])   . Prostate carcinoma [facility]) urologist-  dr Gaynelle Arabian   DX 2007  Gleason 6  and recurrent 2012  s/p  cryoablation of prostate both times  . RBBB (right bundle branch block)   . Recovering alcoholic in remission ([place])   . Renal calculus, bilateral   . Type 2 diabetes mellitus ([place])     Past Surgical History:  Procedure Laterality Date  . CARDIAC CATHETERIZATION N/A 11/27/2015   Procedure: Left Heart Cath and Coronary Angiography;  Surgeon: Peter M [name], MD;  Location: [place] [facility];  Service: Cardiovascular;  Laterality: N/A;  . CARDIOVASCULAR STRESS TEST  05-19-2012  dr Tressia Miners turner   normal perfusionstudy/  no ischemia/  attenuation artifact inferoapex region of myocardium/  note gate secondary to rhythm irregularity  . CARDIOVERSION N/A 01/05/2016   Procedure: CARDIOVERSION;  Surgeon: Sanda Klein, MD;  Location: [place];  Service: Cardiovascular;  Laterality: N/A;  . CATARACT EXTRACTION W/ INTRAOCULAR LENS IMPLANT Right 2014  . COLONOSCOPY W/ POLYPECTOMY  last one 08-10-2013  . CORONARY ARTERY BYPASS GRAFT N/A 11/29/2015   Procedure: CORONARY ARTERY BYPASS GRAFTING (CABG), ON PUMP, TIMES FIVE, USING LEFT INTERNAL MAMMARY ARTERY, RIGHT GREATER SAPHENOUS VEIN HARVESTED ENDOSCOPICALLY;  Surgeon: [name], MD;  Location: [place];  Service: Open Heart Surgery;  Laterality: N/A;  LIMA-LAD; SEQ SVG-OM1-OM2;SVG-DIAG; SVG-PL  . CYSTOSCOPY WITH URETHRAL DILATATION N/A 01/14/2014   Procedure: URETHRAL MEATAL DILATATION;  Surgeon: Ailene Rud, MD;  Location: [facility];  Service: Urology;  Laterality: N/A;  . EXCISION BENIGN SKIN LESION OF BACK  10/ 2011  . HIATAL HERNIA REPAIR  1983  . INGUINAL HERNIA REPAIR Right 10-14-2001  . INTRAOPERATIVE TRANSESOPHAGEAL ECHOCARDIOGRAM N/A 11/29/2015   Procedure: INTRAOPERATIVE TRANSESOPHAGEAL  ECHOCARDIOGRAM;  Surgeon: [name], MD;  Location: [place];  Service: Open Heart Surgery;  Laterality: N/A;  . KNEE ARTHROSCOPY W/ MENISCECTOMY Right 01-25-2003   and chondroplasty  . [place]  . PROSTATE CRYOABLATION  09-17-2010  &  04-12-2005  . REPAIR RIGHT ARM TENDON INJURY  1975  . SHOULDER OPEN ROTATOR CUFF REPAIR  03/20/2011   Procedure: ROTATOR CUFF REPAIR SHOULDER OPEN;  Surgeon: Tobi Bastos;  Location: [facility];  Service: Orthopedics;  Laterality: Right;  . TEE WITHOUT CARDIOVERSION N/A 01/05/2016   Procedure: TRANSESOPHAGEAL ECHOCARDIOGRAM (TEE);  Surgeon: Sanda Klein, MD;  Location[facility];  Service: Cardiovascular;  Laterality: N/A;  . TONSILLECTOMY  as child  . TRANSTHORACIC ECHOCARDIOGRAM  01-11-2013  dr [name]   moderate LVH/  grade I diastolic dysfunction/ ef [id number]  mild AV stenosis /  mild MR/  mild LAE  . TRANSURETHRAL RESECTION OF BLADDER TUMOR N/A 01/14/2014   Procedure: TRANSURETHRAL INCISION OF BLADDER NECK CONTRACTURE;  Surgeon: Ailene Rud, MD;  Location: [facility];  Service: Urology;  Laterality: N/A;  . Vantus Implant  Hormones for Prostate  . VENTRAL HERNIA REPAIR  1998    Family History  Problem Relation Age of Onset  . Heart disease Mother   . Stroke Father   . Cancer Brother     unsure of type    Social History:  reports that he has never smoked. He has never used smokeless tobacco. He reports that he does not drink alcohol or use drugs.  Review of Systems   He has sporadic headaches, less now   He has  COPD with chronic cough, treated by the pulmonologist and is taking steroid inhaler He gets relief with Robitussin Generic cough syrup    HYPERLIPIDEMIA: Well controlled with Crestor   Lab Results  Component Value Date   CHOL 117 10/31/2015   HDL 44.60 10/31/2015   LDLCALC 53 10/31/2015   TRIG 98.0 10/31/2015   CHOLHDL 3 10/31/2015     He was diagnosed have had sleep  apnea but he Declined CPAP    HYPERTENSION: Blood pressure has been Treated with lisinopril, mostly started after his hospitalization However even with reducing the dose to half tablet on the last visit because of hyperkalemia his blood pressure appears low normal today   BP Readings from Last 3 Encounters:  04/04/16 102/60  03/21/16 120/60  01/26/16 138/62    History of mild hypothyroidism And his dose was reduced on the last visit because of suppressed TSH Now better controlled  on 112 g 6 days per week     Lab Results  Component Value Date   TSH 0.46 03/22/2016   TSH 0.18 (L) 01/23/2016   TSH 7.31 (H) 12/12/2015   FREET4 1.16 03/22/2016   FREET4 0.79 12/12/2015   FREET4 1.09 04/18/2015    ANEMIA: This is  Persistent,  taking iron, Usually OTC ferrous sulfate  Iron saturation Previously low at 14%  B-12 deficiency:He has been on B12 tablets 1000 mg daily   Lab Results  Component Value Date   WBC 9.5 03/22/2016   HGB 11.0 (L) 03/22/2016   HCT 34.3 (L) 03/22/2016   MCV 84.9 03/22/2016   PLT 146.0 (L) 03/22/2016       Examination:   BP 102/60   Pulse 64   Ht 5\' 11"  (1.803 m)   Wt 189 lb (85.7 kg)   BMI 26.36 kg/m   Body mass index is 26.36 kg/m.      Assesment/Plan:  Diabetes type 2, uncontrolled  See history of present illness for detailed discussion of his current management, blood sugar patterns and problems identified  Blood sugars are still poorly controlled Primarily because of his eating sweets and drinking milkshakes in the afternoon without mealtime coverage Discusses progressively higher readings in the afternoons persisting in the evenings Has only a couple of good readings when he does not indulge No overnight hypoglycemia with reducing today on the last visit Also discussed need to take his Humalog mix insulin doses before eating regardless of pre-meal blood sugar and blood sugar is going to rise when he is eating Also explained the  nature of premixed insulin  Since he refuses to change her diet he will need to take a dose of about 15 units of the Humalog mix before his afternoon snack or milkshake and then reduced the suppertime dose by 6 units No change in Toujeo for now  ANEMIA:  mild but hemoglobin slightly lower, continue iron supplements but will add vitamin C with this    HYPOTHYROIDISM: TSH is not as suppressed with  Synthroid 6 days a week   Low normal blood pressure: He can stop lisinopril and continue to monitor blood sugar periodically at home, discussed blood pressure targets  Patient Instructions  Take 15 of the Humalog with mid afternoon snack and then reduce supper Humalog to 20 instead of 26  Vitamin C with iron  Stop Lisinopril      Counseling time on subjects discussed above is over 50% of today's 25 minute visit   Hearl Heikes 04/04/2016, 2:59 PM

## 2016-04-05 ENCOUNTER — Other Ambulatory Visit: Payer: Self-pay

## 2016-04-05 MED ORDER — INSULIN LISPRO PROT & LISPRO (75-25 MIX) 100 UNIT/ML ~~LOC~~ SUSP: 26.0000 [IU] | Freq: Two times a day (BID) | SUBCUTANEOUS | 3 refills | Status: DC

## 2016-04-05 NOTE — Telephone Encounter (Signed)
Ordered 04/05/16 

## 2016-04-12 ENCOUNTER — Other Ambulatory Visit: Payer: Self-pay

## 2016-04-12 MED ORDER — OXYCODONE-ACETAMINOPHEN 5-325 MG PO TABS: 1.0000 | ORAL_TABLET | Freq: Three times a day (TID) | ORAL | 0 refills | Status: DC | PRN

## 2016-04-12 NOTE — Telephone Encounter (Signed)
Refill  oxyCODONE-acetaminophen (ROXICET) 5-325 MG tablet 60 tablet

## 2016-04-12 NOTE — Telephone Encounter (Signed)
ok 

## 2016-04-12 NOTE — Telephone Encounter (Signed)
Ready for pick up

## 2016-04-15 ENCOUNTER — Telehealth: Payer: Self-pay | Admitting: Endocrinology

## 2016-04-15 DIAGNOSIS — N3942 Incontinence without sensory awareness: Secondary | ICD-10-CM | POA: Diagnosis not present

## 2016-04-15 DIAGNOSIS — N200 Calculus of kidney: Secondary | ICD-10-CM | POA: Diagnosis not present

## 2016-04-15 DIAGNOSIS — C61 Malignant neoplasm of prostate: Secondary | ICD-10-CM | POA: Diagnosis not present

## 2016-04-15 NOTE — Telephone Encounter (Signed)
It was a mistake prescription is ready for pick-up

## 2016-04-15 NOTE — Telephone Encounter (Signed)
[facility] called and said that they received the fax for the C2, and that prescription cannot be faxed. Be advised.

## 2016-04-22 ENCOUNTER — Other Ambulatory Visit: Payer: Self-pay | Admitting: Family Medicine

## 2016-04-22 MED ORDER — INSULIN LISPRO PROT & LISPRO (75-25 MIX) 100 UNIT/ML ~~LOC~~ SUSP: 26.0000 [IU] | Freq: Two times a day (BID) | SUBCUTANEOUS | 3 refills | Status: DC

## 2016-05-09 ENCOUNTER — Telehealth: Payer: Self-pay | Admitting: Endocrinology

## 2016-05-09 ENCOUNTER — Other Ambulatory Visit: Payer: Self-pay

## 2016-05-09 MED ORDER — OXYCODONE-ACETAMINOPHEN 5-325 MG PO TABS: 1.0000 | ORAL_TABLET | Freq: Three times a day (TID) | ORAL | 0 refills | Status: DC | PRN

## 2016-05-09 NOTE — Telephone Encounter (Signed)
Oxy has been signed and is ready for pick-up

## 2016-05-09 NOTE — Telephone Encounter (Signed)
He needs to find out generic Crestor is covered or what else would be covered.  Okay to prescribe same dose of oxycodone

## 2016-05-09 NOTE — Telephone Encounter (Signed)
Ins will no cover CRESTOR 10 MG tablet, is there another alternative.  Prescription for   oxyCODONE-acetaminophen (ROXICET) 5-325 MG tablet 60 tablet

## 2016-05-17 NOTE — Telephone Encounter (Signed)
Patient bleeding in his ear and nose, bring it to [facility] attention.please avise

## 2016-05-17 NOTE — Telephone Encounter (Signed)
Discussed with his wife, he is not having any bleeding now and this happened mostly from the ear at night, relatively minor.  Explained that this is related to Eliquis and if it does occur again will have him see the ENT specialist.  Had only minor bleeding from the nose

## 2016-05-25 ENCOUNTER — Other Ambulatory Visit: Payer: Self-pay | Admitting: Endocrinology

## 2016-05-28 ENCOUNTER — Other Ambulatory Visit: Payer: Self-pay

## 2016-05-28 MED ORDER — OXYCODONE-ACETAMINOPHEN 5-325 MG PO TABS: 1.0000 | ORAL_TABLET | Freq: Three times a day (TID) | ORAL | 0 refills | Status: DC | PRN

## 2016-05-28 NOTE — Telephone Encounter (Signed)
This is on [employer] for signature

## 2016-05-28 NOTE — Telephone Encounter (Signed)
Refill of    oxyCODONE-acetaminophen (ROXICET) 5-325 MG tablet 60 tablet

## 2016-05-30 ENCOUNTER — Other Ambulatory Visit (INDEPENDENT_AMBULATORY_CARE_PROVIDER_SITE_OTHER): Payer: Medicare Other

## 2016-05-30 DIAGNOSIS — D509 Iron deficiency anemia, unspecified: Secondary | ICD-10-CM

## 2016-05-30 DIAGNOSIS — E1165 Type 2 diabetes mellitus with hyperglycemia: Secondary | ICD-10-CM

## 2016-05-30 DIAGNOSIS — Z794 Long term (current) use of insulin: Secondary | ICD-10-CM

## 2016-05-30 DIAGNOSIS — E038 Other specified hypothyroidism: Secondary | ICD-10-CM | POA: Diagnosis not present

## 2016-05-30 DIAGNOSIS — E063 Autoimmune thyroiditis: Secondary | ICD-10-CM

## 2016-05-30 LAB — COMPREHENSIVE METABOLIC PANEL
ALK PHOS: 71 U/L (ref 39–117)
ALT: 9 U/L (ref 0–53)
AST: 13 U/L (ref 0–37)
Albumin: 3.9 g/dL (ref 3.5–5.2)
BUN: 18 mg/dL (ref 6–23)
CO2: 26 meq/L (ref 19–32)
CREATININE: 1.08 mg/dL (ref 0.40–1.50)
Calcium: 9.4 mg/dL (ref 8.4–10.5)
Chloride: 107 mEq/L (ref 96–112)
GFR: 69.7 mL/min (ref >60.00)
GLUCOSE: 179 mg/dL — AB (ref 70–99)
POTASSIUM: 3.8 meq/L (ref 3.5–5.1)
Sodium: 141 mEq/L (ref 135–145)
TOTAL PROTEIN: 6.6 g/dL (ref 6.0–8.3)
Total Bilirubin: 0.4 mg/dL (ref 0.2–1.2)

## 2016-05-30 LAB — CBC
HCT: 39.6 % (ref 39.0–52.0)
HEMOGLOBIN: 12.5 g/dL — AB (ref 13.0–17.0)
MCHC: 31.6 g/dL (ref 30.0–36.0)
MCV: 89.1 fl (ref 78.0–100.0)
PLATELETS: 147 10*3/uL — AB (ref 150.0–400.0)
RBC: 4.44 Mil/uL (ref 4.22–5.81)
RDW: 16.9 % — ABNORMAL HIGH (ref 11.5–15.5)
WBC: 10 10*3/uL (ref 4.0–10.5)

## 2016-05-30 LAB — TSH: TSH: 1.39 u[IU]/mL (ref 0.35–4.50)

## 2016-05-30 LAB — IBC PANEL
Iron: 33 ug/dL — ABNORMAL LOW (ref 42–165)
SATURATION RATIOS: 10.2 % — AB (ref 20.0–50.0)
Transferrin: 230 mg/dL (ref 212.0–360.0)

## 2016-06-03 ENCOUNTER — Ambulatory Visit: Payer: Medicare Other | Admitting: Endocrinology

## 2016-06-04 ENCOUNTER — Ambulatory Visit (INDEPENDENT_AMBULATORY_CARE_PROVIDER_SITE_OTHER): Payer: Medicare Other | Admitting: Endocrinology

## 2016-06-04 ENCOUNTER — Encounter: Payer: Self-pay | Admitting: Endocrinology

## 2016-06-04 VITALS — BP 148/72 | HR 65 | Ht 71.0 in | Wt 192.0 lb

## 2016-06-04 DIAGNOSIS — I10 Essential (primary) hypertension: Secondary | ICD-10-CM | POA: Diagnosis not present

## 2016-06-04 DIAGNOSIS — E063 Autoimmune thyroiditis: Secondary | ICD-10-CM

## 2016-06-04 DIAGNOSIS — E038 Other specified hypothyroidism: Secondary | ICD-10-CM

## 2016-06-04 DIAGNOSIS — Z794 Long term (current) use of insulin: Secondary | ICD-10-CM

## 2016-06-04 DIAGNOSIS — E1165 Type 2 diabetes mellitus with hyperglycemia: Secondary | ICD-10-CM | POA: Diagnosis not present

## 2016-06-04 MED ORDER — ROSUVASTATIN CALCIUM 10 MG PO TABS: 10.0000 mg | ORAL_TABLET | Freq: Every day | ORAL | 2 refills | Status: DC

## 2016-06-04 NOTE — Progress Notes (Signed)
Patient ID: Barry Officer Sr., male   DOB: 1932-05-19, 83 y.o.   MRN: [id number]   Reason for Appointment: follow-up  of various issues   History of Present Illness   Diagnosis: Type 2 diabetes mellitus, date of diagnosis: 1997.   He has been on basal bolus insulin regimen for a few years.  His blood sugars are usually variably controlled and A1c is usually over 8%  except once was 7.2.  Previously had tried Victoza with some benefit but did not continue this because of perceived side effect of constipation and also out-of-pocket expense  RECENT history:   Insulin regimen: Toujeo insulin 20 units in am  Humalog mix insulin 26 units-26 acs   He has had inconsistent control of his diabetes since at least late 2015 Although his blood sugars improved  after changing Humalog to Humalog mix insulin before each meal in 2/16 he has had labile blood sugars  He still has poor control even though A1c was her lower than expected at 6.8 in January  His blood sugars are showing the following patterns and has the following problems identified:  His Humalog mix was increased with his first meal on his last visit because of higher readings later in the day  However his blood sugars overall at home are higher than before  His wife is trying to keep a chart of his blood sugar and insulin but not easy-to-read this  FASTING blood sugars are somewhat variable and mostly high but can be near normal at times also  His compliance with his diet is still poor and he still eating snacks like ice cream in the afternoons without any insulin coverage  He does not want to take his insulin as directed even though his wife is trying to help him  Sometimes will take insulin after eating  Blood sugars are progressively HIGHER in the evening  Not clear if his evening readings are after eating or before eating, this is  because of very inconsistent times for his meals  Monitors blood glucose:  1-2 times  a day.  Glucometer:  One Touch.  Glucose readings    Mean values apply above for all meters except median for One Touch  PRE-MEAL Fasting Lunch 5-6 PM  7-11 PM  Overall  Glucose range: 82-349   172-373  119-449   Mean/median: 166   240  276  210    Dietician visit: Most recent:1/14.  CDE visit 10/16   Exercise: a little walking  Wt Readings from Last 3 Encounters:  06/04/16 192 lb (87.1 kg)  04/04/16 189 lb (85.7 kg)  03/21/16 187 lb 12.8 oz (85.2 kg)   Lab Results  Component Value Date   HGBA1C 6.8 (H) 03/22/2016   HGBA1C 7.0 (H) 11/28/2015   HGBA1C 7.4 (H) 10/31/2015   Lab Results  Component Value Date   MICROALBUR 8.2 (H) 12/15/2015   LDLCALC 53 10/31/2015   CREATININE 1.08 05/30/2016    Other ACTIVE problems are discussed in review of systems    Lab on 05/30/2016  Component Date Value Ref Range Status  . Sodium 05/30/2016 141  135 - 145 mEq/L Final  . Potassium 05/30/2016 3.8  3.5 - 5.1 mEq/L Final  . Chloride 05/30/2016 107  96 - 112 mEq/L Final  . CO2 05/30/2016 26  19 - 32 mEq/L Final  . Glucose, Bld 05/30/2016 179* 70 - 99 mg/dL Final  . BUN 05/30/2016  18  6 - 23 mg/dL Final  . Creatinine, Ser 05/30/2016 1.08  0.40 - 1.50 mg/dL Final  . Total Bilirubin 05/30/2016 0.4  0.2 - 1.2 mg/dL Final  . Alkaline Phosphatase 05/30/2016 71  39 - 117 U/L Final  . AST 05/30/2016 13  0 - 37 U/L Final  . ALT 05/30/2016 9  0 - 53 U/L Final  . Total Protein 05/30/2016 6.6  6.0 - 8.3 g/dL Final  . Albumin 05/30/2016 3.9  3.5 - 5.2 g/dL Final  . Calcium 05/30/2016 9.4  8.4 - 10.5 mg/dL Final  . GFR 05/30/2016 69.70  >60.00 mL/min Final  . TSH 05/30/2016 1.39  0.35 - 4.50 uIU/mL Final  . WBC 05/30/2016 10.0  4.0 - 10.5 K/uL Final  . RBC 05/30/2016 4.44  4.22 - 5.81 Mil/uL Final  . Platelets 05/30/2016 147.0* 150.0 - 400.0 K/uL Final  . Hemoglobin 05/30/2016 12.5* 13.0 - 17.0 g/dL Final  . HCT 05/30/2016 39.6  39.0 - 52.0 % Final  . MCV 05/30/2016 89.1  78.0 -  100.0 fl Final  . MCHC 05/30/2016 31.6  30.0 - 36.0 g/dL Final  . RDW 05/30/2016 16.9* 11.5 - 15.5 % Final  . Iron 05/30/2016 33* 42 - 165 ug/dL Final  . Transferrin 05/30/2016 230.0  212.0 - 360.0 mg/dL Final  . Saturation Ratios 05/30/2016 10.2* 20.0 - 50.0 % Final    OTHER active problems addressed today are reviewed in review of systems   Allergies as of 06/04/2016      Reactions   Penicillins Swelling   Eyes and lips Has patient had a PCN reaction causing immediate rash, facial/tongue/throat swelling, SOB or lightheadedness with hypotension: Yes Has patient had a PCN reaction causing severe rash involving mucus membranes or skin necrosis: No Has patient had a PCN reaction that required hospitalization No Has patient had a PCN reaction occurring within the last 10 years: No If all of the above answers are "NO", then may proceed with Cephalosporin use   Prednisone Other (See Comments)   Hyperglycemia and AMS      Medication List       Accurate as of 06/04/16 10:34 AM. Always use your most recent med list.          acetaminophen 500 MG tablet Commonly known as:  TYLENOL Take 500 mg by mouth every 8 (eight) hours as needed for mild pain, moderate pain or headache.   albuterol 108 (90 Base) MCG/ACT inhaler Commonly known as:  PROVENTIL HFA;VENTOLIN HFA Inhale 2 puffs into the lungs every 6 (six) hours.   apixaban 5 MG Tabs tablet Commonly known as:  ELIQUIS Take 1 tablet (5 mg total) by mouth 2 (two) times daily.   aspirin EC 81 MG tablet Take 81 mg by mouth daily.   budesonide-formoterol 160-4.5 MCG/ACT inhaler Commonly known as:  SYMBICORT Inhale 2 puffs into the lungs 2 (two) times daily.   ferrous sulfate 325 (65 FE) MG EC tablet Take 325 mg by mouth daily with breakfast.   finasteride 5 MG tablet Commonly known as:  PROSCAR Take 5 mg by mouth every morning.   gabapentin 300 MG capsule Commonly known as:  NEURONTIN TAKE 2 CAPSULES BY MOUTH 3 TIMES DAILY    glucose blood test strip Commonly known as:  ONE TOUCH ULTRA TEST Use as instructed to check blood sugar 3 times per day dx code E11.65   insulin lispro protamine-lispro (75-25) 100 UNIT/ML Susp injection Commonly known as:  HUMALOG 75/25 MIX Inject  26 Units into the skin 2 (two) times daily with a meal.   HUMALOG MIX 75/25 KWIKPEN (75-25) 100 UNIT/ML Kwikpen Generic drug:  Insulin Lispro Prot & Lispro INJECT 20 UNITS SUBCUTANEOUSLY IN THE MORNING AND 30 UNITS IN THE EVENING   levothyroxine 112 MCG tablet Commonly known as:  SYNTHROID, LEVOTHROID Take 112 mcg by mouth as directed.   lisinopril 20 MG tablet Commonly known as:  PRINIVIL,ZESTRIL Take 20 mg by mouth daily.   menthol-thymol Liqd Apply 1 application topically as needed. For pain.   metFORMIN 500 MG tablet Commonly known as:  GLUCOPHAGE Take 1,000 mg by mouth daily with supper.   NASACORT AQ 55 MCG/ACT Aero nasal inhaler Generic drug:  triamcinolone Place 1-2 sprays into the nose daily.   nitroGLYCERIN 0.4 MG SL tablet Commonly known as:  NITROSTAT Place 1 tablet (0.4 mg total) under the tongue every 5 (five) minutes as needed for chest pain.   oxyCODONE-acetaminophen 5-325 MG tablet Commonly known as:  ROXICET Take 1 tablet by mouth every 8 (eight) hours as needed for severe pain.   polyethylene glycol packet Commonly known as:  MIRALAX / GLYCOLAX Take 17 g by mouth daily as needed for mild constipation, moderate constipation or severe constipation. 1 capful daily as needed   rosuvastatin 10 MG tablet Commonly known as:  CRESTOR Take 1 tablet (10 mg total) by mouth daily.   SYSTANE 0.4-0.3 % Soln Generic drug:  Polyethyl Glycol-Propyl Glycol Apply 1-2 drops to eye 3 (three) times daily as needed (for dry eyes).   TOUJEO SOLOSTAR 300 UNIT/ML Sopn Generic drug:  Insulin Glargine Inject 20 Units into the skin daily.   traMADol 50 MG tablet Commonly known as:  ULTRAM Take 50 mg by mouth every 6 (six)  hours as needed for moderate pain.   vitamin B-12 1000 MCG tablet Commonly known as:  CYANOCOBALAMIN Take 1,000 mcg by mouth daily.       Allergies:  Allergies  Allergen Reactions  . Penicillins Swelling    Eyes and lips Has patient had a PCN reaction causing immediate rash, facial/tongue/throat swelling, SOB or lightheadedness with hypotension: Yes Has patient had a PCN reaction causing severe rash involving mucus membranes or skin necrosis: No Has patient had a PCN reaction that required hospitalization No Has patient had a PCN reaction occurring within the last 10 years: No If all of the above answers are "NO", then may proceed with Cephalosporin use   . Prednisone Other (See Comments)    Hyperglycemia and AMS    Past Medical History:  Diagnosis Date  . Anemia    hx of year ago no problems now per pt   . Arthritis    generalized   . Asthma   . Atypical chest pain   . Bladder neck contracture   . Chronic back pain   . Complication of anesthesia    POST OP HYPOTENSION  . Dementia    per wife pt had neurological evaluation  . Diverticulosis of colon   . First degree heart block   . GERD (gastroesophageal reflux disease)   . Heart murmur   . History of basal cell carcinoma excision    forehead and x6 area forearms  . History of Bell's palsy    x2  1998 &  2004 left side --  residual slight left eye droop and vace  . History of chronic bronchitis   . History of colon polyps   . History of gout   . History  of hypertension   . History of MI (myocardial infarction)    08/ 2010  secondary acute renal failure/ dehydration--  medical management  . History of pulmonary embolus (PE)    2010  . Hypogonadism male   . Hypotension   . Hypothyroidism   . OSA (obstructive sleep apnea)    severe per study 2007/  non-compliant cpap  . Peripheral vascular disease ([place])   . Prostate carcinoma [facility]) urologist-  dr Gaynelle Arabian   DX 2007  Gleason 6  and recurrent 2012  s/p   cryoablation of prostate both times  . RBBB (right bundle branch block)   . Recovering alcoholic in remission ([place])   . Renal calculus, bilateral   . Type 2 diabetes mellitus ([place])     Past Surgical History:  Procedure Laterality Date  . CARDIAC CATHETERIZATION N/A 11/27/2015   Procedure: Left Heart Cath and Coronary Angiography;  Surgeon: Peter M [name], MD;  Location: [place] [facility];  Service: Cardiovascular;  Laterality: N/A;  . CARDIOVASCULAR STRESS TEST  05-19-2012  dr Tressia Miners turner   normal perfusionstudy/  no ischemia/  attenuation artifact inferoapex region of myocardium/  note gate secondary to rhythm irregularity  . CARDIOVERSION N/A 01/05/2016   Procedure: CARDIOVERSION;  Surgeon: Sanda Klein, MD;  Location: [place];  Service: Cardiovascular;  Laterality: N/A;  . CATARACT EXTRACTION W/ INTRAOCULAR LENS IMPLANT Right 2014  . COLONOSCOPY W/ POLYPECTOMY  last one 08-10-2013  . CORONARY ARTERY BYPASS GRAFT N/A 11/29/2015   Procedure: CORONARY ARTERY BYPASS GRAFTING (CABG), ON PUMP, TIMES FIVE, USING LEFT INTERNAL MAMMARY ARTERY, RIGHT GREATER SAPHENOUS VEIN HARVESTED ENDOSCOPICALLY;  Surgeon: [name], MD;  Location: [place];  Service: Open Heart Surgery;  Laterality: N/A;  LIMA-LAD; SEQ SVG-OM1-OM2;SVG-DIAG; SVG-PL  . CYSTOSCOPY WITH URETHRAL DILATATION N/A 01/14/2014   Procedure: URETHRAL MEATAL DILATATION;  Surgeon: Ailene Rud, MD;  Location: [facility];  Service: Urology;  Laterality: N/A;  . EXCISION BENIGN SKIN LESION OF BACK  10/ 2011  . HIATAL HERNIA REPAIR  1983  . INGUINAL HERNIA REPAIR Right 10-14-2001  . INTRAOPERATIVE TRANSESOPHAGEAL ECHOCARDIOGRAM N/A 11/29/2015   Procedure: INTRAOPERATIVE TRANSESOPHAGEAL ECHOCARDIOGRAM;  Surgeon: [name], MD;  Location: [place];  Service: Open Heart Surgery;  Laterality: N/A;  . KNEE ARTHROSCOPY W/ MENISCECTOMY Right 01-25-2003   and chondroplasty  . [place]  .  PROSTATE CRYOABLATION  09-17-2010  &  04-12-2005  . REPAIR RIGHT ARM TENDON INJURY  1975  . SHOULDER OPEN ROTATOR CUFF REPAIR  03/20/2011   Procedure: ROTATOR CUFF REPAIR SHOULDER OPEN;  Surgeon: Tobi Bastos;  Location: [facility];  Service: Orthopedics;  Laterality: Right;  . TEE WITHOUT CARDIOVERSION N/A 01/05/2016   Procedure: TRANSESOPHAGEAL ECHOCARDIOGRAM (TEE);  Surgeon: Sanda Klein, MD;  Location: [facility];  Service: Cardiovascular;  Laterality: N/A;  . TONSILLECTOMY  as child  . TRANSTHORACIC ECHOCARDIOGRAM  01-11-2013  dr [name]   moderate LVH/  grade I diastolic dysfunction/ ef 01-09%/  mild AV stenosis /  mild MR/  mild LAE  . TRANSURETHRAL RESECTION OF BLADDER TUMOR N/A 01/14/2014   Procedure: TRANSURETHRAL INCISION OF BLADDER NECK CONTRACTURE;  Surgeon: Ailene Rud, MD;  Location: [facility];  Service: Urology;  Laterality: N/A;  . Vantus Implant     Hormones for Prostate  . VENTRAL HERNIA REPAIR  1998    Family History  Problem Relation Age of Onset  . Heart disease Mother   .  Stroke Father   . Cancer Brother     unsure of type    Social History:  reports that he has never smoked. He has never used smokeless tobacco. He reports that he does not drink alcohol or use drugs.  Review of Systems  He had some bleeding from his right ear and has occasional discomfort lasting as to be examined today No change in hearing  He has  COPD with chronic cough, treated by the pulmonologist  He gets relief with Robitussin Generic cough syrup    HYPERLIPIDEMIA: Well controlled with Crestor   Lab Results  Component Value Date   CHOL 117 10/31/2015   HDL 44.60 10/31/2015   LDLCALC 53 10/31/2015   TRIG 98.0 10/31/2015   CHOLHDL 3 10/31/2015     He was diagnosed have had sleep apnea but he Declined CPAP    HYPERTENSION: Blood pressure had been Treated with lisinopril, mostly started after his hospitalization However even with reducing the dose to  half tablet He had mild hyperkalemia and this was stopped His his blood pressure has been fairly consistently around and 20 systolic except yesterday was 149 Blood pressure is relatively higher again today   BP Readings from Last 3 Encounters:  06/04/16 (!) 158/78  04/04/16 102/60  03/21/16 120/60    History of mild hypothyroidism This is  controlled on 112 g 6 days per week     Lab Results  Component Value Date   TSH 1.39 05/30/2016   TSH 0.46 03/22/2016   TSH 0.18 (L) 01/23/2016   FREET4 1.16 03/22/2016   FREET4 0.79 12/12/2015   FREET4 1.09 04/18/2015    ANEMIA: This is  Persistent,  taking iron, Usually OTC ferrous sulfate With vitamin C  Iron saturation Is still low even though hemoglobin is improved  B-12 deficiency:He has been on B12 tablets 1000 mg daily   Lab Results  Component Value Date   WBC 10.0 05/30/2016   HGB 12.5 (L) 05/30/2016   HCT 39.6 05/30/2016   MCV 89.1 05/30/2016   PLT 147.0 (L) 05/30/2016   He was having some swelling of the right lower leg, apparently this is from where he had his vein removed Swelling is less recently    Examination:   BP (!) 158/78   Pulse 65   Ht 5\' 11"  (1.803 m)   Wt 192 lb (87.1 kg)   SpO2 97%   BMI 26.78 kg/m   Body mass index is 26.78 kg/m.    Has a little old blood in the right posterior auricular canal but no new lesion Tympanic membrane has normal shine No exudate Minimal swelling of the right lower leg without tenderness Has some excoriation and petechial spots on the anterior shins  Assesment/Plan:  Diabetes type 2, uncontrolled  See history of present illness for detailed discussion of his current management, blood sugar patterns and problems identified  Blood sugars are still poorly controlled  He is getting postprandial hyperglycemia despite taking a total of 52 units of premixed insulin with his meals Primarily is high sugars because of his eating sweets such as ice cream and other  sweets Does not take his insulin on time His wife is trying to help him but he is usually not cooperating  Today discussed the use of the V-go pump but this may be potentially useful for better compliance and overall better controlled with less insulin He will most likely try the 30 unit pump, he will take 4-6 units for snacks  and about 10 units before meals Advised him to leave off all sweets including ice cream Hopefully will start being more active with the warm weather approaching  ANEMIA:  mild but hemoglobin near normal despite low iron saturation We will continue iron of vitamin C   ?  Hypertension: Blood pressure is only high the last couple of days and will continue to monitor as it is not consistently high at home He may try losartan needed  Bleeding from the ear: Did not see any lesion and will refer him if needed to ENT, he will stop using  Q-tips  Patient Instructions  Metformin at lunch not supper, 2 pills  1st dose of insulin is 30 units and 26 at dinner      Counseling time on subjects discussed above is over 50% of today's 25 minute visit   Yides Saidi 06/04/2016, 10:34 AM

## 2016-06-04 NOTE — Patient Instructions (Addendum)
Metformin at lunch not supper, 2 pills  1st dose of insulin is 30 units and 26 at dinner

## 2016-06-05 ENCOUNTER — Other Ambulatory Visit: Payer: Self-pay | Admitting: Endocrinology

## 2016-06-08 ENCOUNTER — Other Ambulatory Visit: Payer: Self-pay | Admitting: Physician Assistant

## 2016-06-11 ENCOUNTER — Other Ambulatory Visit: Payer: Self-pay | Admitting: Endocrinology

## 2016-06-13 ENCOUNTER — Encounter: Payer: Self-pay | Admitting: Cardiovascular Disease

## 2016-06-17 ENCOUNTER — Telehealth: Payer: Self-pay | Admitting: Endocrinology

## 2016-06-17 ENCOUNTER — Other Ambulatory Visit: Payer: Self-pay

## 2016-06-17 MED ORDER — OXYCODONE-ACETAMINOPHEN 5-325 MG PO TABS: 1.0000 | ORAL_TABLET | Freq: Three times a day (TID) | ORAL | 0 refills | Status: DC | PRN

## 2016-06-17 NOTE — Telephone Encounter (Signed)
Pt's wife called in and said that Pt needs his Oxycodone Refilled and would like to pick the script up this afternoon.

## 2016-06-17 NOTE — Telephone Encounter (Signed)
Waiting for dr signature

## 2016-06-18 ENCOUNTER — Other Ambulatory Visit: Payer: Self-pay

## 2016-06-18 ENCOUNTER — Encounter: Payer: Medicare Other | Admitting: Nutrition

## 2016-06-18 MED ORDER — ALBUTEROL SULFATE HFA 108 (90 BASE) MCG/ACT IN AERS: 2.0000 | INHALATION_SPRAY | Freq: Four times a day (QID) | RESPIRATORY_TRACT | 1 refills | Status: DC

## 2016-06-18 MED ORDER — INSULIN PEN NEEDLE 32G X 5 MM MISC: 2 refills | Status: DC

## 2016-06-18 NOTE — Telephone Encounter (Signed)
Prescription picked up but was 60 pills if he is supposed to be taking 3 times daily should it be written for 90 pills please advise

## 2016-06-18 NOTE — Telephone Encounter (Signed)
As far as I know he takes it either twice or 3 times a day but not 3 times every day

## 2016-06-18 NOTE — Telephone Encounter (Signed)
No fever but has pain in back of neck, pt wife feels may need a ENT referral

## 2016-06-18 NOTE — Telephone Encounter (Signed)
Oxycodone prescription is ready to be picked up.  Please also confirm that he is taking 3 times a day Stephanie: What is the question about the neck pain?

## 2016-06-25 ENCOUNTER — Other Ambulatory Visit: Payer: Self-pay

## 2016-06-25 MED ORDER — ROSUVASTATIN CALCIUM 10 MG PO TABS: 10.0000 mg | ORAL_TABLET | Freq: Every day | ORAL | 2 refills | Status: DC

## 2016-06-28 ENCOUNTER — Encounter: Payer: Medicare Other | Admitting: Endocrinology

## 2016-06-28 ENCOUNTER — Telehealth: Payer: Self-pay | Admitting: Endocrinology

## 2016-06-28 NOTE — Progress Notes (Signed)
Cardiology Office Note    Date:  07/01/2016   ID:  Barry IODICE Sr., DOB April 25, 1932, MRN [id number]  PCP:  [name], MD  Cardiologist:  Dr. [name]   CC: follow up- atrial flutter s/p DCCV  History of Present Illness:  Barry KYNARD Sr. is a 83 y.o. male with a history of HLD, DMT2, HTN, OSA, PVD, trifascicular block and recently diagnosed CAD s/p CABG x5V with post op atrial flutter who presents to clinic for follow up after recent DCCV.   Admitted from 9/16-9/28/17. He presented with chest pain and ruled in for NSTEMI (pk trop 1.62). Cath showed critical 3 vessel and left main CAD and he was referred for CABG. Echo results showed mild AS, mild MR and LVEF 45-50%. He underwent a CABG x 5 on 11/29/2015. Post op course was c/b  atrial flutter and volume overload requiring diuresis. Given AV dissociation and history of trifascicular block, amiodarone and other AV nodal blocking agents were avoided. He was rate-controlled due to intrinsic AV block.  TEE/DCCV on 01/05/16. He was successfully cardioverted back into sinus with 1st degree block and occasional Mobitz I second degree, but not bradycardic.  Today he presents to clinic for follow up. He has been feeling better with less fatigue. His biggest problem is arthritis. No LE edema, orthopnea or PND. No dizziness or syncope. No chest pain, SOB or palpitations. He has had bronchial trouble his whole life. No cigarettes.    Past Medical History:  Diagnosis Date  . Anemia    hx of year ago no problems now per pt   . Arthritis    generalized   . Asthma   . Atypical chest pain   . Bladder neck contracture   . Chronic back pain   . Complication of anesthesia    POST OP HYPOTENSION  . Dementia    per wife pt had neurological evaluation  . Diverticulosis of colon   . First degree heart block   . GERD (gastroesophageal reflux disease)   . Heart murmur   . History of basal cell carcinoma excision    forehead and x6 area forearms  .  History of Bell's palsy    x2  1998 &  2004 left side --  residual slight left eye droop and vace  . History of chronic bronchitis   . History of colon polyps   . History of gout   . History of hypertension   . History of MI (myocardial infarction)    08/ 2010  secondary acute renal failure/ dehydration--  medical management  . History of pulmonary embolus (PE)    2010  . Hypogonadism male   . Hypotension   . Hypothyroidism   . OSA (obstructive sleep apnea)    severe per study 2007/  non-compliant cpap  . Peripheral vascular disease ([place])   . Prostate carcinoma [facility]) urologist-  dr Gaynelle Arabian   DX 2007  Gleason 6  and recurrent 2012  s/p  cryoablation of prostate both times  . RBBB (right bundle branch block)   . Recovering alcoholic in remission ([place])   . Renal calculus, bilateral   . Type 2 diabetes mellitus ([place])     Past Surgical History:  Procedure Laterality Date  . CARDIAC CATHETERIZATION N/A 11/27/2015   Procedure: Left Heart Cath and Coronary Angiography;  Surgeon: Peter M [name], MD;  Location: [place] [facility];  Service: Cardiovascular;  Laterality: N/A;  . CARDIOVASCULAR STRESS TEST  05-19-2012  dr  traci turner   normal perfusionstudy/  no ischemia/  attenuation artifact inferoapex region of myocardium/  note gate secondary to rhythm irregularity  . CARDIOVERSION N/A 01/05/2016   Procedure: CARDIOVERSION;  Surgeon: Sanda Klein, MD;  Location: [place];  Service: Cardiovascular;  Laterality: N/A;  . CATARACT EXTRACTION W/ INTRAOCULAR LENS IMPLANT Right 2014  . COLONOSCOPY W/ POLYPECTOMY  last one 08-10-2013  . CORONARY ARTERY BYPASS GRAFT N/A 11/29/2015   Procedure: CORONARY ARTERY BYPASS GRAFTING (CABG), ON PUMP, TIMES FIVE, USING LEFT INTERNAL MAMMARY ARTERY, RIGHT GREATER SAPHENOUS VEIN HARVESTED ENDOSCOPICALLY;  Surgeon: [name], MD;  Location: [place];  Service: Open Heart Surgery;  Laterality: N/A;  LIMA-LAD; SEQ SVG-OM1-OM2;SVG-DIAG; SVG-PL  .  CYSTOSCOPY WITH URETHRAL DILATATION N/A 01/14/2014   Procedure: URETHRAL MEATAL DILATATION;  Surgeon: Ailene Rud, MD;  Location: [facility];  Service: Urology;  Laterality: N/A;  . EXCISION BENIGN SKIN LESION OF BACK  10/ 2011  . HIATAL HERNIA REPAIR  1983  . INGUINAL HERNIA REPAIR Right 10-14-2001  . INTRAOPERATIVE TRANSESOPHAGEAL ECHOCARDIOGRAM N/A 11/29/2015   Procedure: INTRAOPERATIVE TRANSESOPHAGEAL ECHOCARDIOGRAM;  Surgeon: [name], MD;  Location: [place];  Service: Open Heart Surgery;  Laterality: N/A;  . KNEE ARTHROSCOPY W/ MENISCECTOMY Right 01-25-2003   and chondroplasty  . [place]  . PROSTATE CRYOABLATION  09-17-2010  &  04-12-2005  . REPAIR RIGHT ARM TENDON INJURY  1975  . SHOULDER OPEN ROTATOR CUFF REPAIR  03/20/2011   Procedure: ROTATOR CUFF REPAIR SHOULDER OPEN;  Surgeon: Tobi Bastos;  Location: [facility];  Service: Orthopedics;  Laterality: Right;  . TEE WITHOUT CARDIOVERSION N/A 01/05/2016   Procedure: TRANSESOPHAGEAL ECHOCARDIOGRAM (TEE);  Surgeon: Sanda Klein, MD;  Location: [facility];  Service: Cardiovascular;  Laterality: N/A;  . TONSILLECTOMY  as child  . TRANSTHORACIC ECHOCARDIOGRAM  01-11-2013  dr [name]   moderate LVH/  grade I diastolic dysfunction/ ef 41-32%/  mild AV stenosis /  mild MR/  mild LAE  . TRANSURETHRAL RESECTION OF BLADDER TUMOR N/A 01/14/2014   Procedure: TRANSURETHRAL INCISION OF BLADDER NECK CONTRACTURE;  Surgeon: Ailene Rud, MD;  Location: [facility];  Service: Urology;  Laterality: N/A;  . Vantus Implant     Hormones for Prostate  . VENTRAL HERNIA REPAIR  1998    Current Medications: Outpatient Medications Prior to Visit  Medication Sig Dispense Refill  . acetaminophen (TYLENOL) 500 MG tablet Take 500 mg by mouth every 8 (eight) hours as needed for mild pain, moderate pain or headache.     . albuterol (PROVENTIL HFA;VENTOLIN HFA) 108 (90 Base) MCG/ACT inhaler  Inhale 2 puffs into the lungs every 6 (six) hours. 1 Inhaler 1  . apixaban (ELIQUIS) 5 MG TABS tablet Take 1 tablet (5 mg total) by mouth 2 (two) times daily. 60 tablet 5  . aspirin EC 81 MG tablet Take 81 mg by mouth daily.     . budesonide-formoterol (SYMBICORT) 160-4.5 MCG/ACT inhaler Inhale 2 puffs into the lungs 2 (two) times daily. 3 Inhaler 3  . ferrous sulfate 325 (65 FE) MG EC tablet Take 325 mg by mouth daily with breakfast.    . finasteride (PROSCAR) 5 MG tablet Take 5 mg by mouth every morning.     . gabapentin (NEURONTIN) 300 MG capsule TAKE ONE CAPSULE BY MOUTH 4 TIMES DAILY 120 capsule 3  . levothyroxine (SYNTHROID, LEVOTHROID) 112 MCG tablet TAKE ONE TABLET BY MOUTH ONCE DAILY 90 tablet  0  . lisinopril (PRINIVIL,ZESTRIL) 20 MG tablet Take 20 mg by mouth daily.    Marland Kitchen menthol-thymol (ABSORBINE JR) LIQD Apply 1 application topically as needed. For pain.    . metFORMIN (GLUCOPHAGE) 500 MG tablet Take 1,000 mg by mouth daily with supper.    . nitroGLYCERIN (NITROSTAT) 0.4 MG SL tablet PLACE ONE TABLET UNDER THE TONGUE EVERY 5 MINUTES AS NEEDED FOR CHEST PAIN 25 tablet 3  . oxyCODONE-acetaminophen (ROXICET) 5-325 MG tablet Take 1 tablet by mouth every 8 (eight) hours as needed for severe pain. 60 tablet 0  . Polyethyl Glycol-Propyl Glycol (SYSTANE) 0.4-0.3 % SOLN Apply 1-2 drops to eye 3 (three) times daily as needed (for dry eyes).    . polyethylene glycol (MIRALAX / GLYCOLAX) packet Take 17 g by mouth daily as needed for mild constipation, moderate constipation or severe constipation. 1 capful daily as needed    . rosuvastatin (CRESTOR) 10 MG tablet Take 1 tablet (10 mg total) by mouth daily. 90 tablet 2  . traMADol (ULTRAM) 50 MG tablet Take 50 mg by mouth every 6 (six) hours as needed for moderate pain.    Marland Kitchen triamcinolone (NASACORT AQ) 55 MCG/ACT AERO nasal inhaler Place 1-2 sprays into the nose daily.     . vitamin B-12 (CYANOCOBALAMIN) 1000 MCG tablet Take 1,000 mcg by mouth daily.     Marland Kitchen HUMALOG MIX 75/25 KWIKPEN (75-25) 100 UNIT/ML Kwikpen INJECT 20 UNITS SUBCUTANEOUSLY IN THE MORNING AND 30 UNITS IN THE EVENING (Patient not taking: Reported on 06/28/2016) 9 pen 2  . Insulin Glargine (TOUJEO SOLOSTAR) 300 UNIT/ML SOPN Inject 20 Units into the skin daily.    . insulin lispro protamine-lispro (HUMALOG 75/25 MIX) (75-25) 100 UNIT/ML SUSP injection Inject 26 Units into the skin 2 (two) times daily with a meal. (Patient taking differently: Inject 30 Units into the skin daily with breakfast. Take 30 Units in the morning and 26 Units at night) 10 mL 3  . Insulin Pen Needle 32G X 5 MM MISC Use to inject insulin 2 times daily (Patient not taking: Reported on 06/28/2016) 90 each 2  . ONE TOUCH ULTRA TEST test strip USE AS DIRECTED THREE TIMES DAILY TO TEST BLOOD SUGAR 100 each 5   No facility-administered medications prior to visit.      Allergies:   Penicillins and Prednisone   Social History   Social History  . Marital status: Married    Spouse name: N/A  . Number of children: N/A  . Years of education: N/A   Occupational History  . Retired      Personal assistant    Social History Main Topics  . Smoking status: Never Smoker  . Smokeless tobacco: Never Used  . Alcohol use No     Comment: RECOVERING ALCOHOLIC none since 8299   . Drug use: No  . Sexual activity: Not Currently   Other Topics Concern  . None   Social History Narrative  . None     Family History:  The patient's family history includes Cancer in his brother; Heart disease in his mother; Stroke in his father.     ROS All other systems reviewed and are negative.   PHYSICAL EXAM:   VS:  BP 120/60   Pulse 74   Ht 5\' 11"  (1.803 m)   Wt 189 lb 6.4 oz (85.9 kg)   SpO2 97%   BMI 26.42 kg/m      Affect appropriate Healthy:  appears stated age HEENT: normal Neck supple  with no adenopathy JVP normal no bruits no thyromegaly Lungs clear with no wheezing and good diaphragmatic motion Heart:  S1/S2 no  murmur, no rub, gallop or click post sternotomy PMI normal Abdomen: benighn, BS positve, no tenderness, no AAA no bruit.  No HSM or HJR Distal pulses intact with no bruits Post right SVG venectomy  Neuro non-focal Skin warm and dry No muscular weakness RLE venectomy healed    Wt Readings from Last 3 Encounters:  07/01/16 189 lb 6.4 oz (85.9 kg)  06/28/16 191 lb 3.2 oz (86.7 kg)  06/04/16 192 lb (87.1 kg)      Studies/Labs Reviewed:   EKG:     01/10/16 SR first degree RBBB rate 71   Recent Labs: 11/25/2015: B Natriuretic Peptide 1,139.9 11/30/2015: Magnesium 2.2 05/30/2016: ALT 9; BUN 18; Creatinine, Ser 1.08; Hemoglobin 12.5; Platelets 147.0; Potassium 3.8; Sodium 141; TSH 1.39   Lipid Panel    Component Value Date/Time   CHOL 117 10/31/2015 1340   TRIG 98.0 10/31/2015 1340   HDL 44.60 10/31/2015 1340   CHOLHDL 3 10/31/2015 1340   VLDL 19.6 10/31/2015 1340   LDLCALC 53 10/31/2015 1340    Additional studies/ records that were reviewed today include:  Left Heart Cath and Coronary Angiography by Dr. [name] on 11/27/2015:  Conclusion     Ost LM to LM lesion, 80 %stenosed.  Prox LAD lesion, 80 %stenosed.  Mid LAD lesion, 90 %stenosed.  Dist LAD lesion, 95 %stenosed.  Ost 1st Diag to 1st Diag lesion, 100 %stenosed.  Ost Ramus lesion, 90 %stenosed.  Ost Cx to Prox Cx lesion, 90 %stenosed.  Ost 1st Mrg to 1st Mrg lesion, 80 %stenosed.  Prox Cx to Mid Cx lesion, 100 %stenosed.  Prox RCA lesion, 95 %stenosed.  Dist RCA lesion, 90 %stenosed.  1. Critical 3 vessel and left main CAD 2. Mildly elevated LV EDP 3. No significant AV gradient by pullback.    Coronary artery bypass grafting x5 with left internal mammary to the left anterior descending coronary artery reverse saphenous vein graft to the diagonal coronary artery, sequential reverse saphenous vein graft to the first and second obtuse marginal reverse saphenous vein graft to the posterior lateral  branch of the right coronary artery with right thigh and calf greater saphenous vein harvesting endoscopically by Dr. Servando Snare on 11/29/2015.   2D ECHO: 11/28/2015 LV EF: 45% -   50% Study Conclusions - Left ventricle: The cavity size was normal. There was severe   concentric hypertrophy. Systolic function was mildly reduced. The   estimated ejection fraction was in the range of 45% to 50%.   Hypokinesis of the apical, mid-apical anteroseptal and   inferoseptal myocardium, and apical inferolateral, and inferior   myocardium. Doppler parameters are consistent with a reversible   restrictive pattern, indicative of decreased left ventricular   diastolic compliance and/or increased left atrial pressure (grade   3 diastolic dysfunction). - Aortic valve: Trileaflet; moderately thickened, moderately   calcified leaflets. Valve mobility was restricted. There was mild   stenosis. There was no regurgitation. Valve area (VTI): 0.88   cm^2. Valve area (Vmax): 0.96 cm^2. Valve area (Vmean): 0.86   cm^2. - Mitral valve: Calcified annulus. Transvalvular velocity was   within the normal range. There was no evidence for stenosis.   There was mild regurgitation. - Left atrium: The atrium was severely dilated. - Right ventricle: The cavity size was normal. Wall thickness was   normal. Systolic function was normal. - Atrial septum: A  patent foramen ovale cannot be excluded. - Tricuspid valve: There was mild regurgitation. Impressions: - Aortic valve appears heavily calcified and thickend. There   appears to be mild aortic stenosis by peak velocity and mean   gradients. However, given reduced systolic function, cannot rule   out more severe valvular heart disease or &quot;low flow, low   gradient&quot; aortic stenosis.   ASSESSMENT & PLAN:   CAD: LM 3VD seen by s/p CABG x5V  September 2017 . Continue ASA and statin. Refer to cardiac rehab  Post op atrial flutter s/p DCCV 01/05/16 : now it Mobitz  type I.  -- No AV nodal blocking agents. Continue Eliquis 5mg  BID for CHADSVASC of 6 (CHF, HTN, age, DM, CAD).   Bifascicular block, Mobitz type I: significant conduction abnormality seen on EKG, avoiding Beta blockers. No syncope. Continue to monitor  Chronic kidney disease stage III: creat stable  Lab Results  Component Value Date   CREATININE 1.08 05/30/2016   BUN 18 05/30/2016   NA 141 05/30/2016   K 3.8 05/30/2016   CL 107 05/30/2016   CO2 26 05/30/2016     Hyperlipidemia: LDL 53. Continue Crestor 10mg  daily.   Diabetes with nephropathy: continue current regimen and follow up with Dr. Dwyane Dee  HTN: BP well controlled on current regimen   Barry Mcdaniel

## 2016-06-28 NOTE — Telephone Encounter (Signed)
Ordered

## 2016-06-28 NOTE — Telephone Encounter (Signed)
Pt's wife called in to request that his One Touch Test Strips be sent into the [facility] on [address], he is testing 2-3x daily.

## 2016-06-28 NOTE — Progress Notes (Signed)
This encounter was created in error - please disregard.

## 2016-07-01 ENCOUNTER — Ambulatory Visit (INDEPENDENT_AMBULATORY_CARE_PROVIDER_SITE_OTHER): Payer: Medicare Other | Admitting: Cardiovascular Disease

## 2016-07-01 ENCOUNTER — Encounter: Payer: Self-pay | Admitting: Cardiovascular Disease

## 2016-07-01 VITALS — BP 120/60 | HR 74 | Ht 71.0 in | Wt 189.4 lb

## 2016-07-01 DIAGNOSIS — R011 Cardiac murmur, unspecified: Secondary | ICD-10-CM | POA: Diagnosis not present

## 2016-07-01 DIAGNOSIS — I4892 Unspecified atrial flutter: Secondary | ICD-10-CM | POA: Diagnosis not present

## 2016-07-01 NOTE — Patient Instructions (Addendum)

## 2016-07-03 ENCOUNTER — Encounter: Payer: Medicare Other | Attending: Endocrinology | Admitting: Nutrition

## 2016-07-03 DIAGNOSIS — E1165 Type 2 diabetes mellitus with hyperglycemia: Secondary | ICD-10-CM | POA: Diagnosis not present

## 2016-07-03 DIAGNOSIS — Z794 Long term (current) use of insulin: Secondary | ICD-10-CM | POA: Diagnosis not present

## 2016-07-03 DIAGNOSIS — IMO0002 Reserved for concepts with insufficient information to code with codable children: Secondary | ICD-10-CM

## 2016-07-03 DIAGNOSIS — Z713 Dietary counseling and surveillance: Secondary | ICD-10-CM | POA: Diagnosis not present

## 2016-07-03 DIAGNOSIS — E118 Type 2 diabetes mellitus with unspecified complications: Secondary | ICD-10-CM

## 2016-07-03 NOTE — Progress Notes (Signed)
Pt. Did not bring meter.   Said FBS today was 105.  Wife said it has been lower the last 3-4 days, because is eating less at HS.  AcS: low 200s Showed them the V-Go and suggested we try it for free for a few days,, and suggested he can bolus when eating late afternoon snacks, preventing the high readings acS.  But,  he wants to see what it will cost him.  I filled out paperwork and faxed it in for him.  They were given the 800 number to call if they do not hear from them in 3 days.  His wife will call me after they hear,and let me know if he wants to try it.

## 2016-07-03 NOTE — Patient Instructions (Signed)
Call when you hear from [facility], and let me know if you would like to try this for 6 days.  [place] [phone]

## 2016-07-08 ENCOUNTER — Telehealth: Payer: Self-pay | Admitting: Endocrinology

## 2016-07-08 MED ORDER — OXYCODONE-ACETAMINOPHEN 5-325 MG PO TABS: 1.0000 | ORAL_TABLET | Freq: Three times a day (TID) | ORAL | 0 refills | Status: DC | PRN

## 2016-07-08 NOTE — Telephone Encounter (Signed)
Patient notified rx has been signed and is ready for pick up. Rx placed upfront for the patient to pick up.

## 2016-07-08 NOTE — Telephone Encounter (Signed)
Oxycodone  °

## 2016-07-08 NOTE — Telephone Encounter (Signed)
Pt's wife called in to request refill of Pt's O

## 2016-07-10 NOTE — Telephone Encounter (Signed)
Patient want kumar to go ahead and get the vgo for [place]

## 2016-07-11 ENCOUNTER — Telehealth: Payer: Self-pay | Admitting: *Deleted

## 2016-07-11 NOTE — Telephone Encounter (Addendum)
See message I reviewed the note on the v-go approval form. Patient saw Vaughan Basta on 07/03/2016 and it was documented in her note she reviewed the v-go with him. Please advise if the patient needs be seen by Vaughan Basta again to discuss the v-go.

## 2016-07-11 NOTE — Telephone Encounter (Signed)
That appointment was canceled, need appointment is to be made

## 2016-07-11 NOTE — Telephone Encounter (Signed)
Caleta from [facility] called  about patients medication. Per Kimber Relic we need to call the patient's wife to discuss the price of the medications. Please call patient's wife at [phone].

## 2016-07-11 NOTE — Telephone Encounter (Signed)
Colletta [place], Could you please help schedule this patient with Vaughan Basta? Thanks!

## 2016-07-12 ENCOUNTER — Other Ambulatory Visit: Payer: Self-pay

## 2016-07-12 NOTE — Telephone Encounter (Signed)
Patient wife want Dr Dwyane Dee to call her.

## 2016-07-12 NOTE — Telephone Encounter (Addendum)
I contacted the patient's wife. She stated the patient has decided not to proceed with the v-go pump. She wanted to verify when the patient's next follow up should be? Please advise, Thanks!

## 2016-07-12 NOTE — Telephone Encounter (Signed)
I contacted the patient's wife and Barry Mcdaniel has decided at this time to not proceed with the v-go pump.

## 2016-07-12 NOTE — Telephone Encounter (Signed)
He can come back in about 6 weeks with labs

## 2016-07-12 NOTE — Telephone Encounter (Signed)
I contacted the patient's wife and advised of message. Lab appointment scheduled for 08/19/2016 and appointment scheduled for 08/30/2016.

## 2016-07-14 ENCOUNTER — Inpatient Hospital Stay (HOSPITAL_COMMUNITY)
Admission: EM | Admit: 2016-07-14 | Discharge: 2016-07-19 | DRG: 871 | Disposition: A | Discharge status: Home Health | Payer: Medicare Other | Attending: Internal Medicine | Admitting: Internal Medicine

## 2016-07-14 ENCOUNTER — Emergency Department (HOSPITAL_COMMUNITY): Payer: Medicare Other

## 2016-07-14 ENCOUNTER — Encounter (HOSPITAL_COMMUNITY): Payer: Self-pay | Admitting: Emergency Medicine

## 2016-07-14 DIAGNOSIS — R51 Headache: Secondary | ICD-10-CM | POA: Diagnosis present

## 2016-07-14 DIAGNOSIS — R5081 Fever presenting with conditions classified elsewhere: Secondary | ICD-10-CM | POA: Diagnosis not present

## 2016-07-14 DIAGNOSIS — I4819 Other persistent atrial fibrillation: Secondary | ICD-10-CM | POA: Diagnosis present

## 2016-07-14 DIAGNOSIS — D696 Thrombocytopenia, unspecified: Secondary | ICD-10-CM | POA: Diagnosis present

## 2016-07-14 DIAGNOSIS — E785 Hyperlipidemia, unspecified: Secondary | ICD-10-CM | POA: Diagnosis present

## 2016-07-14 DIAGNOSIS — Z452 Encounter for adjustment and management of vascular access device: Secondary | ICD-10-CM | POA: Diagnosis not present

## 2016-07-14 DIAGNOSIS — Z8546 Personal history of malignant neoplasm of prostate: Secondary | ICD-10-CM | POA: Diagnosis not present

## 2016-07-14 DIAGNOSIS — I350 Nonrheumatic aortic (valve) stenosis: Secondary | ICD-10-CM | POA: Diagnosis present

## 2016-07-14 DIAGNOSIS — A4101 Sepsis due to Methicillin susceptible Staphylococcus aureus: Secondary | ICD-10-CM | POA: Diagnosis present

## 2016-07-14 DIAGNOSIS — I481 Persistent atrial fibrillation: Secondary | ICD-10-CM | POA: Diagnosis not present

## 2016-07-14 DIAGNOSIS — E119 Type 2 diabetes mellitus without complications: Secondary | ICD-10-CM | POA: Diagnosis not present

## 2016-07-14 DIAGNOSIS — I080 Rheumatic disorders of both mitral and aortic valves: Secondary | ICD-10-CM | POA: Diagnosis present

## 2016-07-14 DIAGNOSIS — Z794 Long term (current) use of insulin: Secondary | ICD-10-CM | POA: Diagnosis not present

## 2016-07-14 DIAGNOSIS — R05 Cough: Secondary | ICD-10-CM | POA: Diagnosis not present

## 2016-07-14 DIAGNOSIS — Z85828 Personal history of other malignant neoplasm of skin: Secondary | ICD-10-CM

## 2016-07-14 DIAGNOSIS — K219 Gastro-esophageal reflux disease without esophagitis: Secondary | ICD-10-CM | POA: Diagnosis present

## 2016-07-14 DIAGNOSIS — E1165 Type 2 diabetes mellitus with hyperglycemia: Secondary | ICD-10-CM | POA: Diagnosis present

## 2016-07-14 DIAGNOSIS — R509 Fever, unspecified: Secondary | ICD-10-CM | POA: Diagnosis present

## 2016-07-14 DIAGNOSIS — E1151 Type 2 diabetes mellitus with diabetic peripheral angiopathy without gangrene: Secondary | ICD-10-CM | POA: Diagnosis present

## 2016-07-14 DIAGNOSIS — J189 Pneumonia, unspecified organism: Secondary | ICD-10-CM | POA: Diagnosis present

## 2016-07-14 DIAGNOSIS — E1122 Type 2 diabetes mellitus with diabetic chronic kidney disease: Secondary | ICD-10-CM | POA: Diagnosis present

## 2016-07-14 DIAGNOSIS — N183 Chronic kidney disease, stage 3 unspecified: Secondary | ICD-10-CM | POA: Diagnosis present

## 2016-07-14 DIAGNOSIS — F0390 Unspecified dementia without behavioral disturbance: Secondary | ICD-10-CM | POA: Diagnosis not present

## 2016-07-14 DIAGNOSIS — I130 Hypertensive heart and chronic kidney disease with heart failure and stage 1 through stage 4 chronic kidney disease, or unspecified chronic kidney disease: Secondary | ICD-10-CM | POA: Diagnosis present

## 2016-07-14 DIAGNOSIS — Z7901 Long term (current) use of anticoagulants: Secondary | ICD-10-CM

## 2016-07-14 DIAGNOSIS — I482 Chronic atrial fibrillation: Secondary | ICD-10-CM | POA: Diagnosis present

## 2016-07-14 DIAGNOSIS — J440 Chronic obstructive pulmonary disease with acute lower respiratory infection: Secondary | ICD-10-CM | POA: Diagnosis present

## 2016-07-14 DIAGNOSIS — I248 Other forms of acute ischemic heart disease: Secondary | ICD-10-CM | POA: Diagnosis present

## 2016-07-14 DIAGNOSIS — M542 Cervicalgia: Secondary | ICD-10-CM

## 2016-07-14 DIAGNOSIS — Z981 Arthrodesis status: Secondary | ICD-10-CM

## 2016-07-14 DIAGNOSIS — IMO0002 Reserved for concepts with insufficient information to code with codable children: Secondary | ICD-10-CM | POA: Diagnosis present

## 2016-07-14 DIAGNOSIS — I340 Nonrheumatic mitral (valve) insufficiency: Secondary | ICD-10-CM | POA: Diagnosis present

## 2016-07-14 DIAGNOSIS — I5022 Chronic systolic (congestive) heart failure: Secondary | ICD-10-CM | POA: Diagnosis present

## 2016-07-14 DIAGNOSIS — E039 Hypothyroidism, unspecified: Secondary | ICD-10-CM | POA: Diagnosis present

## 2016-07-14 DIAGNOSIS — Z8249 Family history of ischemic heart disease and other diseases of the circulatory system: Secondary | ICD-10-CM | POA: Diagnosis not present

## 2016-07-14 DIAGNOSIS — Z880 Allergy status to penicillin: Secondary | ICD-10-CM

## 2016-07-14 DIAGNOSIS — D509 Iron deficiency anemia, unspecified: Secondary | ICD-10-CM | POA: Diagnosis present

## 2016-07-14 DIAGNOSIS — I4510 Unspecified right bundle-branch block: Secondary | ICD-10-CM | POA: Diagnosis present

## 2016-07-14 DIAGNOSIS — Z823 Family history of stroke: Secondary | ICD-10-CM

## 2016-07-14 DIAGNOSIS — G4733 Obstructive sleep apnea (adult) (pediatric): Secondary | ICD-10-CM | POA: Diagnosis present

## 2016-07-14 DIAGNOSIS — I2510 Atherosclerotic heart disease of native coronary artery without angina pectoris: Secondary | ICD-10-CM | POA: Diagnosis not present

## 2016-07-14 DIAGNOSIS — I252 Old myocardial infarction: Secondary | ICD-10-CM | POA: Diagnosis not present

## 2016-07-14 DIAGNOSIS — Z951 Presence of aortocoronary bypass graft: Secondary | ICD-10-CM

## 2016-07-14 DIAGNOSIS — A419 Sepsis, unspecified organism: Secondary | ICD-10-CM

## 2016-07-14 DIAGNOSIS — I10 Essential (primary) hypertension: Secondary | ICD-10-CM | POA: Diagnosis not present

## 2016-07-14 DIAGNOSIS — I4891 Unspecified atrial fibrillation: Secondary | ICD-10-CM | POA: Diagnosis not present

## 2016-07-14 DIAGNOSIS — Z7951 Long term (current) use of inhaled steroids: Secondary | ICD-10-CM

## 2016-07-14 DIAGNOSIS — Z86711 Personal history of pulmonary embolism: Secondary | ICD-10-CM

## 2016-07-14 DIAGNOSIS — B9561 Methicillin susceptible Staphylococcus aureus infection as the cause of diseases classified elsewhere: Secondary | ICD-10-CM

## 2016-07-14 DIAGNOSIS — R6889 Other general symptoms and signs: Secondary | ICD-10-CM | POA: Diagnosis not present

## 2016-07-14 DIAGNOSIS — Z888 Allergy status to other drugs, medicaments and biological substances status: Secondary | ICD-10-CM | POA: Diagnosis not present

## 2016-07-14 DIAGNOSIS — Z833 Family history of diabetes mellitus: Secondary | ICD-10-CM

## 2016-07-14 DIAGNOSIS — D72829 Elevated white blood cell count, unspecified: Secondary | ICD-10-CM | POA: Diagnosis not present

## 2016-07-14 DIAGNOSIS — Z79899 Other long term (current) drug therapy: Secondary | ICD-10-CM

## 2016-07-14 DIAGNOSIS — R7881 Bacteremia: Secondary | ICD-10-CM | POA: Diagnosis not present

## 2016-07-14 DIAGNOSIS — Z7982 Long term (current) use of aspirin: Secondary | ICD-10-CM

## 2016-07-14 DIAGNOSIS — R519 Headache, unspecified: Secondary | ICD-10-CM

## 2016-07-14 DIAGNOSIS — Z87442 Personal history of urinary calculi: Secondary | ICD-10-CM

## 2016-07-14 DIAGNOSIS — R404 Transient alteration of awareness: Secondary | ICD-10-CM | POA: Diagnosis not present

## 2016-07-14 DIAGNOSIS — R072 Precordial pain: Secondary | ICD-10-CM | POA: Diagnosis not present

## 2016-07-14 LAB — COMPREHENSIVE METABOLIC PANEL
ALT: 10 U/L — ABNORMAL LOW (ref 17–63)
AST: 17 U/L (ref 15–41)
Albumin: 3.1 g/dL — ABNORMAL LOW (ref 3.5–5.0)
Alkaline Phosphatase: 64 U/L (ref 38–126)
Anion gap: 8 (ref 5–15)
BUN: 19 mg/dL (ref 6–20)
CHLORIDE: 110 mmol/L (ref 101–111)
CO2: 19 mmol/L — AB (ref 22–32)
CREATININE: 1.12 mg/dL (ref 0.61–1.24)
Calcium: 8.7 mg/dL — ABNORMAL LOW (ref 8.9–10.3)
GFR calc Af Amer: >60 mL/min (ref >60)
GFR calc non Af Amer: 60 mL/min — ABNORMAL LOW (ref >60)
GLUCOSE: 167 mg/dL — AB (ref 65–99)
Potassium: 4.1 mmol/L (ref 3.5–5.1)
SODIUM: 137 mmol/L (ref 135–145)
Total Bilirubin: 0.7 mg/dL (ref 0.3–1.2)
Total Protein: 6 g/dL — ABNORMAL LOW (ref 6.5–8.1)

## 2016-07-14 LAB — CBC WITH DIFFERENTIAL/PLATELET
BASOS ABS: 0 10*3/uL (ref 0.0–0.1)
BASOS PCT: 0 %
EOS PCT: 0 %
Eosinophils Absolute: 0 10*3/uL (ref 0.0–0.7)
HCT: 34.6 % — ABNORMAL LOW (ref 39.0–52.0)
Hemoglobin: 11.3 g/dL — ABNORMAL LOW (ref 13.0–17.0)
LYMPHS PCT: 7 %
Lymphs Abs: 1.2 10*3/uL (ref 0.7–4.0)
MCH: 28.8 pg (ref 26.0–34.0)
MCHC: 32.7 g/dL (ref 30.0–36.0)
MCV: 88.3 fL (ref 78.0–100.0)
MONOS PCT: 3 %
Monocytes Absolute: 0.5 10*3/uL (ref 0.1–1.0)
NEUTROS ABS: 15.2 10*3/uL — AB (ref 1.7–7.7)
Neutrophils Relative %: 90 %
Platelets: 124 10*3/uL — ABNORMAL LOW (ref 150–400)
RBC: 3.92 MIL/uL — ABNORMAL LOW (ref 4.22–5.81)
RDW: 15.8 % — ABNORMAL HIGH (ref 11.5–15.5)
WBC: 16.9 10*3/uL — ABNORMAL HIGH (ref 4.0–10.5)

## 2016-07-14 LAB — URINALYSIS, ROUTINE W REFLEX MICROSCOPIC
Bilirubin Urine: NEGATIVE
Glucose, UA: NEGATIVE mg/dL
Hgb urine dipstick: NEGATIVE
Ketones, ur: NEGATIVE mg/dL
LEUKOCYTES UA: NEGATIVE
NITRITE: NEGATIVE
Protein, ur: NEGATIVE mg/dL
SPECIFIC GRAVITY, URINE: 1.004 — AB (ref 1.005–1.030)
pH: 6 (ref 5.0–8.0)

## 2016-07-14 LAB — I-STAT CG4 LACTIC ACID, ED: Lactic Acid, Venous: 2.34 mmol/L (ref 0.5–1.9)

## 2016-07-14 LAB — PROTIME-INR
INR: 1.12
Prothrombin Time: 14.5 seconds (ref 11.4–15.2)

## 2016-07-14 LAB — I-STAT TROPONIN, ED: Troponin i, poc: 0.02 ng/mL (ref 0.00–0.08)

## 2016-07-14 MED ORDER — SODIUM CHLORIDE 0.9 % IV BOLUS (SEPSIS)
1000.0000 mL | Freq: Once | INTRAVENOUS | Status: AC
  Administered 2016-07-14: 1000 mL via INTRAVENOUS

## 2016-07-14 MED ORDER — SODIUM CHLORIDE 0.9 % IV BOLUS (SEPSIS)
1000.0000 mL | Freq: Once | INTRAVENOUS | Status: AC
Start: 2016-07-14 — End: 2016-07-14
  Administered 2016-07-14: 1000 mL via INTRAVENOUS

## 2016-07-14 MED ORDER — DEXTROSE 5 % IV SOLN
2.0000 g | Freq: Once | INTRAVENOUS | Status: AC
  Administered 2016-07-14: 2 g via INTRAVENOUS
  Filled 2016-07-14: qty 2

## 2016-07-14 MED ORDER — LEVOFLOXACIN IN D5W 750 MG/150ML IV SOLN
750.0000 mg | Freq: Once | INTRAVENOUS | Status: AC
  Administered 2016-07-14: 750 mg via INTRAVENOUS
  Filled 2016-07-14: qty 150

## 2016-07-14 MED ORDER — APIXABAN 5 MG PO TABS
5.0000 mg | ORAL_TABLET | Freq: Two times a day (BID) | ORAL | Status: DC
  Administered 2016-07-14 – 2016-07-19 (×10): 5 mg via ORAL
  Filled 2016-07-14 (×10): qty 1

## 2016-07-14 MED ORDER — ACETAMINOPHEN 500 MG PO TABS
500.0000 mg | ORAL_TABLET | Freq: Once | ORAL | Status: AC
  Administered 2016-07-14: 500 mg via ORAL
  Filled 2016-07-14: qty 1

## 2016-07-14 MED ORDER — ALBUTEROL SULFATE (2.5 MG/3ML) 0.083% IN NEBU
2.5000 mg | INHALATION_SOLUTION | Freq: Once | RESPIRATORY_TRACT | Status: AC
  Administered 2016-07-14: 2.5 mg via RESPIRATORY_TRACT
  Filled 2016-07-14: qty 3

## 2016-07-14 MED ORDER — VANCOMYCIN HCL IN DEXTROSE 1-5 GM/200ML-% IV SOLN
1000.0000 mg | Freq: Once | INTRAVENOUS | Status: AC
Start: 2016-07-14 — End: 2016-07-14
  Administered 2016-07-14: 1000 mg via INTRAVENOUS
  Filled 2016-07-14: qty 200

## 2016-07-14 NOTE — ED Notes (Signed)
Pt to radiology on cardiac monitor.

## 2016-07-14 NOTE — ED Provider Notes (Signed)
Patient seen and evaluated. Discussed with Loni Muse NP. Patient with fever of 102 at home. Intermittent episodes of chest pain here. Chronic cough. Diffuse rhonchi in his lungs but no infiltrate. Urine does not appear as though it is obvious source. No skin rash. No abdominal complaints. Normal troponin. EKG without changes. Slight elevation of lactate. Not tachycardic, hypoxemic, or hypertensive. Agree with antibiotics. Chest pain rule out. Admission.   Tanna Furry, MD 07/14/16 [phone]

## 2016-07-14 NOTE — ED Notes (Signed)
Patient's family said pain was having chest pain prior to calling EMS.  Family gave patient 324 ASA and 3 nitro.  No additional medicine given by EMS.

## 2016-07-14 NOTE — ED Notes (Signed)
Returned from xray

## 2016-07-14 NOTE — ED Notes (Signed)
Called main lab for flu swab

## 2016-07-14 NOTE — ED Triage Notes (Signed)
Patient comes from home for urination frequency and cough. Family states she has been getting more confused the last few days.  EMS had temp of 102 temporally.  Gave 1000 mg tylenol.  Repeat was 99.4 orally here.  Patient is orientated to person and situation disorientated to time.  Coughing up yellow phlegm when coughing.

## 2016-07-14 NOTE — ED Provider Notes (Signed)
[facility] DEPT Provider Note   CSN: [id number] Arrival date & time: 07/14/16  2036     History   Chief Complaint Chief Complaint  Patient presents with  . Urinary Frequency  . Cough    HPI Barry SPECHT Sr. is a 83 y.o. male.  Today developed nausea, chest pain ,urinary frequency, and fever to 102.2 Was give 3 SL Nitro with some relief of his chest pain  No longer nauseated Was given Tylenol for fever. Patient has a history of quadruple bypass surgery, for which he takes Elavil was in nitroglycerin for intermittent pain, also has a history of COPD with chronic cough, PE,GERD,HTN, DM,RBBB, recovering alcoholic, anemia, prostate cancer dementia      Past Medical History:  Diagnosis Date  . Anemia    hx of year ago no problems now per pt   . Arthritis    generalized   . Asthma   . Atypical chest pain   . Bladder neck contracture   . Chronic back pain   . Complication of anesthesia    POST OP HYPOTENSION  . Dementia    per wife pt had neurological evaluation  . Diverticulosis of colon   . First degree heart block   . GERD (gastroesophageal reflux disease)   . Heart murmur   . History of basal cell carcinoma excision    forehead and x6 area forearms  . History of Bell's palsy    x2  1998 &  2004 left side --  residual slight left eye droop and vace  . History of chronic bronchitis   . History of colon polyps   . History of gout   . History of hypertension   . History of MI (myocardial infarction)    08/ 2010  secondary acute renal failure/ dehydration--  medical management  . History of pulmonary embolus (PE)    2010  . Hypogonadism male   . Hypotension   . Hypothyroidism   . OSA (obstructive sleep apnea)    severe per study 2007/  non-compliant cpap  . Peripheral vascular disease ([place])   . Prostate carcinoma [facility]) urologist-  dr Gaynelle Arabian   DX 2007  Gleason 6  and recurrent 2012  s/p  cryoablation of prostate both times  . RBBB (right bundle branch  block)   . Recovering alcoholic in remission ([place])   . Renal calculus, bilateral   . Type 2 diabetes mellitus [facility])     Patient Active Problem List   Diagnosis Date Noted  . Fever 07/14/2016  . Dementia 07/14/2016  . Persistent atrial fibrillation ([place])   . Atrial flutter ([place]) 12/04/2015  . S/P CABG (coronary artery bypass graft) 11/29/2015  . NSTEMI (non-ST elevated myocardial infarction) ([place]) 11/25/2015  . CKD (chronic kidney disease), stage III 11/25/2015  . Memory loss 01/04/2015  . Cough variant asthma 10/12/2013  . Iron deficiency anemia 07/23/2013  . Irregular heart beat 12/11/2012  . Type II diabetes mellitus, uncontrolled ([place]) 10/12/2012  . Mitral regurgitation and aortic stenosis 10/12/2012  . Pure hypercholesterolemia 10/12/2012  . Hypothyroidism 10/12/2012  . Osteoarthritis 10/12/2012  . Prostate cancer ([place]) 10/12/2012    Past Surgical History:  Procedure Laterality Date  . CARDIAC CATHETERIZATION N/A 11/27/2015   Procedure: Left Heart Cath and Coronary Angiography;  Surgeon: Peter M [name], MD;  Location: [place] CV LAB;  Service: Cardiovascular;  Laterality: N/A;  . CARDIOVASCULAR STRESS TEST  05-19-2012  dr Tressia Miners turner   normal perfusionstudy/  no ischemia/  attenuation artifact inferoapex region of myocardium/  note gate secondary to rhythm irregularity  . CARDIOVERSION N/A 01/05/2016   Procedure: CARDIOVERSION;  Surgeon: Sanda Klein, MD;  Location: [facility];  Service: Cardiovascular;  Laterality: N/A;  . CATARACT EXTRACTION W/ INTRAOCULAR LENS IMPLANT Right 2014  . COLONOSCOPY W/ POLYPECTOMY  last one 08-10-2013  . CORONARY ARTERY BYPASS GRAFT N/A 11/29/2015   Procedure: CORONARY ARTERY BYPASS GRAFTING (CABG), ON PUMP, TIMES FIVE, USING LEFT INTERNAL MAMMARY ARTERY, RIGHT GREATER SAPHENOUS VEIN HARVESTED ENDOSCOPICALLY;  Surgeon: [name], MD;  Location: [facility];  Service: Open Heart Surgery;  Laterality: N/A;  LIMA-LAD; SEQ  SVG-OM1-OM2;SVG-DIAG; SVG-PL  . CYSTOSCOPY WITH URETHRAL DILATATION N/A 01/14/2014   Procedure: URETHRAL MEATAL DILATATION;  Surgeon: Ailene Rud, MD;  Location: [facility];  Service: Urology;  Laterality: N/A;  . EXCISION BENIGN SKIN LESION OF BACK  10/ 2011  . HIATAL HERNIA REPAIR  1983  . INGUINAL HERNIA REPAIR Right 10-14-2001  . INTRAOPERATIVE TRANSESOPHAGEAL ECHOCARDIOGRAM N/A 11/29/2015   Procedure: INTRAOPERATIVE TRANSESOPHAGEAL ECHOCARDIOGRAM;  Surgeon: [name], MD;  Location: [facility];  Service: Open Heart Surgery;  Laterality: N/A;  . KNEE ARTHROSCOPY W/ MENISCECTOMY Right 01-25-2003   and chondroplasty  . [place]  . PROSTATE CRYOABLATION  09-17-2010  &  04-12-2005  . REPAIR RIGHT ARM TENDON INJURY  1975  . SHOULDER OPEN ROTATOR CUFF REPAIR  03/20/2011   Procedure: ROTATOR CUFF REPAIR SHOULDER OPEN;  Surgeon: Tobi Bastos;  Location: [facility];  Service: Orthopedics;  Laterality: Right;  . TEE WITHOUT CARDIOVERSION N/A 01/05/2016   Procedure: TRANSESOPHAGEAL ECHOCARDIOGRAM (TEE);  Surgeon: Sanda Klein, MD;  Location: [facility] END[facility]COPY;  Service: Cardiovascular;  Laterality: N/A;  . TONSILLECTOMY  as child  . TRANSTHORACIC ECHOCARDIOGRAM  01-11-2013  dr [name]   moderate LVH/  grade I diastolic dysfunction/ ef 99-83%/  mild AV stenosis /  mild MR/  mild LAE  . TRANSURETHRAL RESECTION OF BLADDER TUMOR N/A 01/14/2014   Procedure: TRANSURETHRAL INCISION OF BLADDER NECK CONTRACTURE;  Surgeon: Ailene Rud, MD;  Location: [facility];  Service: Urology;  Laterality: N/A;  . Vantus Implant     Hormones for Prostate  . [place] Medications    Prior to Admission medications   Medication Sig Start Date End Date Taking? Authorizing Provider  acetaminophen (TYLENOL) 500 MG tablet Take 500 mg by mouth every 8 (eight) hours as needed for mild pain, moderate pain or headache.    Yes  [provider]  albuterol (PROVENTIL HFA;VENTOLIN HFA) 108 (90 Base) MCG/ACT inhaler Inhale 2 puffs into the lungs every 6 (six) hours. Patient taking differently: Inhale 2 puffs into the lungs every 6 (six) hours as needed for wheezing or shortness of breath.  06/18/16  Yes [name], MD  apixaban (ELIQUIS) 5 MG TABS tablet Take 1 tablet (5 mg total) by mouth 2 (two) times daily. 04/01/16  Yes Josue Hector, MD  aspirin 325 MG tablet Take 325 mg by mouth once.   Yes [provider]  aspirin EC 81 MG tablet Take 81 mg by mouth daily.    Yes [provider]  budesonide-formoterol (SYMBICORT) 160-4.5 MCG/ACT inhaler Inhale 2 puffs into the lungs 2 (two) times daily. 03/08/14  Yes Tanda Rockers, MD  ferrous sulfate 325 (65 FE) MG EC tablet Take 325 mg by mouth daily with supper.  Yes [provider]  finasteride (PROSCAR) 5 MG tablet Take 5 mg by mouth daily.    Yes [provider]  gabapentin (NEURONTIN) 300 MG capsule TAKE ONE CAPSULE BY MOUTH 4 TIMES DAILY Patient taking differently: TAKE ONE CAPSULE BY MOUTH TWICE DAILY - MAY TAKE 2 ADDITIONAL CAPSULES DURING THE DAY AS NEEDED FOR PAIN - MAX 4 CAPSULES DAILY 06/11/16  Yes [name], MD  HUMALOG MIX 75/25 (75-25) 100 UNIT/ML SUSP injection Inject 26-30 Units into the skin See admin instructions. Take 30 units subcutaneously daily with breakfast and 26 units with supper 04/20/16  Yes [provider]  levothyroxine (SYNTHROID, LEVOTHROID) 112 MCG tablet TAKE ONE TABLET BY MOUTH ONCE DAILY 06/05/16  Yes [name], MD  lisinopril (PRINIVIL,ZESTRIL) 20 MG tablet Take 20 mg by mouth daily.   Yes [provider]  menthol-thymol (ABSORBINE JR) LIQD Apply 1 application topically 3 (three) times daily as needed (pain). For pain.   Yes [provider]  metFORMIN (GLUCOPHAGE) 500 MG tablet Take 1,000 mg by mouth daily with lunch.    Yes [provider]  nitroGLYCERIN  (NITROSTAT) 0.4 MG SL tablet PLACE ONE TABLET UNDER THE TONGUE EVERY 5 MINUTES AS NEEDED FOR CHEST PAIN 06/10/16  Yes Eileen Stanford, PA-C  oxyCODONE-acetaminophen (ROXICET) 5-325 MG tablet Take 1 tablet by mouth every 8 (eight) hours as needed for severe pain. 07/08/16  Yes [name], MD  Polyethyl Glycol-Propyl Glycol (SYSTANE) 0.4-0.3 % SOLN Apply 1-2 drops to eye 3 (three) times daily as needed (for dry eyes).   Yes [provider]  polyethylene glycol (MIRALAX / GLYCOLAX) packet Take 17 g by mouth daily as needed (constipation). Mix in 8 oz liquid and drink   Yes [provider]  rosuvastatin (CRESTOR) 10 MG tablet Take 1 tablet (10 mg total) by mouth daily. 06/25/16  Yes [name], MD  triamcinolone (NASACORT AQ) 55 MCG/ACT AERO nasal inhaler Place 2 sprays into the nose daily.    Yes [provider]  vitamin B-12 (CYANOCOBALAMIN) 1000 MCG tablet Take 1,000 mcg by mouth daily.   Yes [provider]    Family History Family History  Problem Relation Age of Onset  . Heart disease Mother   . Stroke Father   . Cancer Brother     unsure of type  . Cancer Sister   . Diabetes Sister   . Diabetes Sister   . Stroke Brother   . Cancer Brother   . Cancer Brother   . Cancer Brother   . Cancer Brother   . Cancer Brother     Social History Social History  Substance Use Topics  . Smoking status: Never Smoker  . Smokeless tobacco: Never Used  . Alcohol use No     Comment: RECOVERING ALCOHOLIC none since 1751      Allergies   Penicillins and Prednisone   Review of Systems Review of Systems  Constitutional: Positive for fever.  Respiratory: Positive for cough. Negative for shortness of breath.   Cardiovascular: Positive for chest pain.  Gastrointestinal: Positive for nausea. Negative for vomiting.  Genitourinary: Positive for frequency.     Physical Exam Updated Vital Signs BP 130/68 (BP Location: Left Arm)   Pulse 70   Temp 98.6 F  (37 C) (Oral)   Resp 18   Ht 5\' 11"  (1.803 m)   Wt 85.8 kg   SpO2 99%   BMI 26.39 kg/m   Physical Exam  Constitutional: He is oriented to  person, place, and time. He appears well-developed and well-nourished. No distress.  HENT:  Mouth/Throat: Oropharynx is clear and moist.  Neck: Normal range of motion.  Cardiovascular: Normal rate.   Pulmonary/Chest: He has rales. He exhibits no tenderness.  Abdominal: Soft. Bowel sounds are normal.  Musculoskeletal: Normal range of motion. He exhibits no edema.  Neurological: He is alert and oriented to person, place, and time.  Skin: Skin is warm. He is diaphoretic. There is pallor.  Psychiatric: He has a normal mood and affect.  Nursing note and vitals reviewed.    ED Treatments / Results  Labs (all labs ordered are listed, but only abnormal results are displayed) Labs Reviewed  BLOOD CULTURE ID PANEL (REFLEXED) - Abnormal; Notable for the following:       Result Value   Staphylococcus species DETECTED (*)    Staphylococcus aureus DETECTED (*)    All other components within normal limits  COMPREHENSIVE METABOLIC PANEL - Abnormal; Notable for the following:    CO2 19 (*)    Glucose, Bld 167 (*)    Calcium 8.7 (*)    Total Protein 6.0 (*)    Albumin 3.1 (*)    ALT 10 (*)    GFR calc non Af Amer 60 (*)    All other components within normal limits  CBC WITH DIFFERENTIAL/PLATELET - Abnormal; Notable for the following:    WBC 16.9 (*)    RBC 3.92 (*)    Hemoglobin 11.3 (*)    HCT 34.6 (*)    RDW 15.8 (*)    Platelets 124 (*)    Neutro Abs 15.2 (*)    All other components within normal limits  URINALYSIS, ROUTINE W REFLEX MICROSCOPIC - Abnormal; Notable for the following:    Color, Urine COLORLESS (*)    Specific Gravity, Urine 1.004 (*)    All other components within normal limits  CBC WITH DIFFERENTIAL/PLATELET - Abnormal; Notable for the following:    WBC 20.1 (*)    RBC 3.83 (*)    Hemoglobin 10.6 (*)    HCT 33.9 (*)     RDW 15.8 (*)    Platelets 129 (*)    Neutro Abs 17.9 (*)    Monocytes Absolute 1.4 (*)    All other components within normal limits  COMPREHENSIVE METABOLIC PANEL - Abnormal; Notable for the following:    Chloride 113 (*)    CO2 20 (*)    Glucose, Bld 164 (*)    Calcium 8.0 (*)    Total Protein 5.5 (*)    Albumin 2.9 (*)    ALT 15 (*)    All other components within normal limits  BASIC METABOLIC PANEL - Abnormal; Notable for the following:    Chloride 114 (*)    CO2 19 (*)    Glucose, Bld 149 (*)    Calcium 8.0 (*)    All other components within normal limits  CBC - Abnormal; Notable for the following:    WBC 20.8 (*)    RBC 3.86 (*)    Hemoglobin 10.6 (*)    HCT 34.1 (*)    RDW 15.9 (*)    Platelets 127 (*)    All other components within normal limits  GLUCOSE, CAPILLARY - Abnormal; Notable for the following:    Glucose-Capillary 156 (*)    All other components within normal limits  GLUCOSE, CAPILLARY - Abnormal; Notable for the following:    Glucose-Capillary 133 (*)    All other components within  normal limits  TROPONIN I - Abnormal; Notable for the following:    Troponin I 0.05 (*)    All other components within normal limits  GLUCOSE, CAPILLARY - Abnormal; Notable for the following:    Glucose-Capillary 154 (*)    All other components within normal limits  GLUCOSE, CAPILLARY - Abnormal; Notable for the following:    Glucose-Capillary 145 (*)    All other components within normal limits  I-STAT CG4 LACTIC ACID, ED - Abnormal; Notable for the following:    Lactic Acid, Venous 2.34 (*)    All other components within normal limits  CG4 I-STAT (LACTIC ACID) - Abnormal; Notable for the following:    Lactic Acid, Venous 2.05 (*)    All other components within normal limits  CULTURE, BLOOD (ROUTINE X 2)  CULTURE, BLOOD (ROUTINE X 2)  CULTURE, BLOOD (SINGLE)  CULTURE, BLOOD (SINGLE)  PROTIME-INR  INFLUENZA PANEL BY PCR (TYPE A & B)  LACTIC ACID, PLASMA  LACTIC  ACID, PLASMA  PROCALCITONIN  BASIC METABOLIC PANEL  CBC  I-STAT TROPOININ, ED  I-STAT CG4 LACTIC ACID, ED    EKG  EKG Interpretation  Date/Time:  Sunday Jul 14 2016 23:02:31 EDT Ventricular Rate:  88 PR Interval:    QRS Duration: 145 QT Interval:  419 QTC Calculation: 507 R Axis:   -76 Text Interpretation:  Atrial fibrillation RBBB and LAFB Inferior infarct, acute Baseline wander in lead(s) V3 No significant change since last tracing Confirmed by Theotis Burrow [phone]) on 07/15/2016 11:57:34 AM       Radiology Dg Chest 2 View  Result Date: 07/14/2016 CLINICAL DATA:  Cough and mental status changes.  Fever. EXAM: CHEST  2 VIEW COMPARISON:  01/04/2016 FINDINGS: Prior median sternotomy. Apical lordotic frontal radiographs. Midline trachea. Moderate cardiomegaly. Atherosclerosis in the transverse aorta. No pleural effusion or pneumothorax. Hyperinflation. No congestive failure. No lobar consolidation. IMPRESSION: Cardiomegaly and hyperinflation, without acute disease. Electronically Signed   By: Abigail Miyamoto M.D.   On: 07/14/2016 21:55    Procedures Procedures (including critical care time)  Medications Ordered in ED Medications  apixaban (ELIQUIS) tablet 5 mg (5 mg Oral Given 07/15/16 1029)  finasteride (PROSCAR) tablet 5 mg (5 mg Oral Given 07/15/16 1029)  aspirin EC tablet 81 mg (81 mg Oral Given 07/15/16 1029)  polyethylene glycol (MIRALAX / GLYCOLAX) packet 17 g (not administered)  acetaminophen (TYLENOL) tablet 500 mg (500 mg Oral Given 07/15/16 0819)  triamcinolone (NASACORT) nasal inhaler 2 spray (2 sprays Nasal Given 07/15/16 1115)  vitamin B-12 (CYANOCOBALAMIN) tablet 1,000 mcg (1,000 mcg Oral Given 07/15/16 1029)  polyvinyl alcohol (LIQUIFILM TEARS) 1.4 % ophthalmic solution 1-2 drop (not administered)  ferrous sulfate tablet 325 mg (325 mg Oral Given 07/15/16 1750)  lisinopril (PRINIVIL,ZESTRIL) tablet 20 mg (20 mg Oral Given 07/15/16 1029)  levothyroxine (SYNTHROID, LEVOTHROID)  tablet 112 mcg (112 mcg Oral Given 07/15/16 0819)  rosuvastatin (CRESTOR) tablet 10 mg (10 mg Oral Given 07/15/16 0214)  oxyCODONE-acetaminophen (PERCOCET/ROXICET) 5-325 MG per tablet 1 tablet (1 tablet Oral Given 07/15/16 1422)  sodium chloride flush (NS) 0.9 % injection 3 mL (3 mLs Intravenous Given 07/15/16 1030)  sodium chloride flush (NS) 0.9 % injection 3 mL (not administered)  0.9 %  sodium chloride infusion (not administered)  ondansetron (ZOFRAN) tablet 4 mg (not administered)    Or  ondansetron (ZOFRAN) injection 4 mg (not administered)  albuterol (PROVENTIL) (2.5 MG/3ML) 0.083% nebulizer solution 2.5 mg (not administered)  guaiFENesin (MUCINEX) 12 hr tablet 600 mg (600  mg Oral Given 07/15/16 1029)  insulin aspart (novoLOG) injection 0-9 Units (1 Units Subcutaneous Given 07/15/16 1750)  vancomycin (VANCOCIN) IVPB 750 mg/150 ml premix (0 mg Intravenous Stopped 07/15/16 1215)  sodium chloride 0.9 % bolus 1,000 mL (0 mLs Intravenous Stopped 07/14/16 2230)    And  sodium chloride 0.9 % bolus 1,000 mL (0 mLs Intravenous Stopped 07/14/16 2230)    And  sodium chloride 0.9 % bolus 1,000 mL (0 mLs Intravenous Stopped 07/14/16 2320)  levofloxacin (LEVAQUIN) IVPB 750 mg (0 mg Intravenous Stopped 07/14/16 2251)  aztreonam (AZACTAM) 2 g in dextrose 5 % 50 mL IVPB (0 g Intravenous Stopped 07/14/16 2320)  vancomycin (VANCOCIN) IVPB 1000 mg/200 mL premix (0 mg Intravenous Stopped 07/14/16 2245)  acetaminophen (TYLENOL) tablet 500 mg (500 mg Oral Given 07/14/16 2146)  albuterol (PROVENTIL) (2.5 MG/3ML) 0.083% nebulizer solution 2.5 mg (2.5 mg Nebulization Given 07/14/16 2147)  aspirin tablet 325 mg (325 mg Oral Given 07/15/16 0214)     Initial Impression / Assessment and Plan / ED Course  I have reviewed the triage vital signs and the nursing notes.  Pertinent labs & imaging results that were available during my care of the patient were reviewed by me and considered in my medical decision making (see chart for details).      On presentation, patient is febrile, borderline tachycardia of 99.  Sepsis protocol was started.  Antibiotics were started for unknown source, which remains unclear clear at time of admission with a negative urine, negative chest x-ray, but an elevated white count of 16.9 and a lactic acid of 2.34. Patient has continued to have intermittent episodes of chest discomfort with no change in his EKG, and a normal troponin    Final Clinical Impressions(s) / ED Diagnoses   Final diagnoses:  Sepsis, due to unspecified organism [facility])    New Prescriptions Current Discharge Medication List       Junius Creamer, NP 07/15/16 2019    Tanna Furry, MD 07/26/16 [phone]

## 2016-07-14 NOTE — H&P (Signed)
History and Physical    Barry BORDON Sr. WUG:[id number] DOB: [birth year] DOA: 07/14/2016  PCP: [name], MD  Patient coming from: home  Chief Complaint:   fever  HPI: Barry VILLARD Sr. is a 83 y.o. male with medical history significant of dementia, CABG, HTN brought in by wife for fever.  History obtrained from wife alone.  No n/v/d.  He has been coughing but coughs chronically.  She reports he coughs when he eats and always sounds like he has pneumonia.  No recent abx.  No rashes.  He complains of pain in various areas sometimes in his chest.  No urinary symptoms that he says.  No nasal congestion.  Pt found to have fever, and referred for admission for fever.   Review of Systems: As per HPI otherwise 10 point review of systems negative.   Past Medical History:  Diagnosis Date  . Anemia    hx of year ago no problems now per pt   . Arthritis    generalized   . Asthma   . Atypical chest pain   . Bladder neck contracture   . Chronic back pain   . Complication of anesthesia    POST OP HYPOTENSION  . Dementia    per wife pt had neurological evaluation  . Diverticulosis of colon   . First degree heart block   . GERD (gastroesophageal reflux disease)   . Heart murmur   . History of basal cell carcinoma excision    forehead and x6 area forearms  . History of Bell's palsy    x2  1998 &  2004 left side --  residual slight left eye droop and vace  . History of chronic bronchitis   . History of colon polyps   . History of gout   . History of hypertension   . History of MI (myocardial infarction)    08/ 2010  secondary acute renal failure/ dehydration--  medical management  . History of pulmonary embolus (PE)    2010  . Hypogonadism male   . Hypotension   . Hypothyroidism   . OSA (obstructive sleep apnea)    severe per study 2007/  non-compliant cpap  . Peripheral vascular disease ([place])   . Prostate carcinoma [facility]) urologist-  dr Gaynelle Arabian   DX 2007  Gleason 6  and recurrent  2012  s/p  cryoablation of prostate both times  . RBBB (right bundle branch block)   . Recovering alcoholic in remission ([place])   . Renal calculus, bilateral   . Type 2 diabetes mellitus ([place])     Past Surgical History:  Procedure Laterality Date  . CARDIAC CATHETERIZATION N/A 11/27/2015   Procedure: Left Heart Cath and Coronary Angiography;  Surgeon: Peter M [name], MD;  Location: [place] [facility];  Service: Cardiovascular;  Laterality: N/A;  . CARDIOVASCULAR STRESS TEST  05-19-2012  dr Tressia Miners turner   normal perfusionstudy/  no ischemia/  attenuation artifact inferoapex region of myocardium/  note gate secondary to rhythm irregularity  . CARDIOVERSION N/A 01/05/2016   Procedure: CARDIOVERSION;  Surgeon: Sanda Klein, MD;  Location: [place];  Service: Cardiovascular;  Laterality: N/A;  . CATARACT EXTRACTION W/ INTRAOCULAR LENS IMPLANT Right 2014  . COLONOSCOPY W/ POLYPECTOMY  last one 08-10-2013  . CORONARY ARTERY BYPASS GRAFT N/A 11/29/2015   Procedure: CORONARY ARTERY BYPASS GRAFTING (CABG), ON PUMP, TIMES FIVE, USING LEFT INTERNAL MAMMARY ARTERY, RIGHT GREATER SAPHENOUS VEIN HARVESTED ENDOSCOPICALLY;  Surgeon: [name], MD;  Location: [facility]  OR;  Service: Open Heart Surgery;  Laterality: N/A;  LIMA-LAD; SEQ SVG-OM1-OM2;SVG-DIAG; SVG-PL  . CYSTOSCOPY WITH URETHRAL DILATATION N/A 01/14/2014   Procedure: URETHRAL MEATAL DILATATION;  Surgeon: Ailene Rud, MD;  Location: [facility];  Service: Urology;  Laterality: N/A;  . EXCISION BENIGN SKIN LESION OF BACK  10/ 2011  . HIATAL HERNIA REPAIR  1983  . INGUINAL HERNIA REPAIR Right 10-14-2001  . INTRAOPERATIVE TRANSESOPHAGEAL ECHOCARDIOGRAM N/A 11/29/2015   Procedure: INTRAOPERATIVE TRANSESOPHAGEAL ECHOCARDIOGRAM;  Surgeon: [name], MD;  Location: [place];  Service: Open Heart Surgery;  Laterality: N/A;  . KNEE ARTHROSCOPY W/ MENISCECTOMY Right 01-25-2003   and chondroplasty  . [place]  . PROSTATE CRYOABLATION  09-17-2010  &  04-12-2005  . REPAIR RIGHT ARM TENDON INJURY  1975  . SHOULDER OPEN ROTATOR CUFF REPAIR  03/20/2011   Procedure: ROTATOR CUFF REPAIR SHOULDER OPEN;  Surgeon: Tobi Bastos;  Location: [facility];  Service: Orthopedics;  Laterality: Right;  . TEE WITHOUT CARDIOVERSION N/A 01/05/2016   Procedure: TRANSESOPHAGEAL ECHOCARDIOGRAM (TEE);  Surgeon: Sanda Klein, MD;  Location: [facility] ENDOSCOPY;  Service: Cardiovascular;  Laterality: N/A;  . TONSILLECTOMY  as child  . TRANSTHORACIC ECHOCARDIOGRAM  01-11-2013  dr [name]   moderate LVH/  grade I diastolic dysfunction/ ef 34-74%/  mild AV stenosis /  mild MR/  mild LAE  . TRANSURETHRAL RESECTION OF BLADDER TUMOR N/A 01/14/2014   Procedure: TRANSURETHRAL INCISION OF BLADDER NECK CONTRACTURE;  Surgeon: Ailene Rud, MD;  Location[facility];  Service: Urology;  Laterality: N/A;  . Vantus Implant     Hormones for Prostate  . [place]     reports that he has never smoked. He has never used smokeless tobacco. He reports that he does not drink alcohol or use drugs.  Allergies  Allergen Reactions  . Penicillins Swelling    Eyes and lips Has patient had a PCN reaction causing immediate rash, facial/tongue/throat swelling, SOB or lightheadedness with hypotension: Yes Has patient had a PCN reaction causing severe rash involving mucus membranes or skin necrosis: No Has patient had a PCN reaction that required hospitalization No Has patient had a PCN reaction occurring within the last 10 years: No If all of the above answers are "NO", then may proceed with Cephalosporin use   . Prednisone Other (See Comments)    Hyperglycemia and AMS    Family History  Problem Relation Age of Onset  . Heart disease Mother   . Stroke Father   . Cancer Brother     unsure of type    Prior to Admission medications   Medication Sig Start Date End Date Taking? Authorizing Provider    acetaminophen (TYLENOL) 500 MG tablet Take 500 mg by mouth every 8 (eight) hours as needed for mild pain, moderate pain or headache.    Yes [provider]  albuterol (PROVENTIL HFA;VENTOLIN HFA) 108 (90 Base) MCG/ACT inhaler Inhale 2 puffs into the lungs every 6 (six) hours. Patient taking differently: Inhale 2 puffs into the lungs every 6 (six) hours as needed for wheezing or shortness of breath.  06/18/16  Yes [name], MD  apixaban (ELIQUIS) 5 MG TABS tablet Take 1 tablet (5 mg total) by mouth 2 (two) times daily. 04/01/16  Yes Josue Hector, MD  aspirin 325 MG tablet Take 325 mg by mouth once.   Yes [provider]  aspirin EC 81 MG  tablet Take 81 mg by mouth daily.    Yes [provider]  budesonide-formoterol (SYMBICORT) 160-4.5 MCG/ACT inhaler Inhale 2 puffs into the lungs 2 (two) times daily. 03/08/14  Yes Tanda Rockers, MD  ferrous sulfate 325 (65 FE) MG EC tablet Take 325 mg by mouth daily with supper.    Yes [provider]  finasteride (PROSCAR) 5 MG tablet Take 5 mg by mouth daily.    Yes [provider]  gabapentin (NEURONTIN) 300 MG capsule TAKE ONE CAPSULE BY MOUTH 4 TIMES DAILY Patient taking differently: TAKE ONE CAPSULE BY MOUTH TWICE DAILY - MAY TAKE 2 ADDITIONAL CAPSULES DURING THE DAY AS NEEDED FOR PAIN - MAX 4 CAPSULES DAILY 06/11/16  Yes [name], MD  HUMALOG MIX 75/25 (75-25) 100 UNIT/ML SUSP injection Inject 26-30 Units into the skin See admin instructions. Take 30 units subcutaneously daily with breakfast and 26 units with supper 04/20/16  Yes [provider]  levothyroxine (SYNTHROID, LEVOTHROID) 112 MCG tablet TAKE ONE TABLET BY MOUTH ONCE DAILY 06/05/16  Yes [name], MD  lisinopril (PRINIVIL,ZESTRIL) 20 MG tablet Take 20 mg by mouth daily.   Yes [provider]  menthol-thymol (ABSORBINE JR) LIQD Apply 1 application topically 3 (three) times daily as needed (pain). For pain.   Yes [provider]  metFORMIN (GLUCOPHAGE) 500 MG tablet Take 1,000 mg by mouth daily with lunch.    Yes [provider]  nitroGLYCERIN (NITROSTAT) 0.4 MG SL tablet PLACE ONE TABLET UNDER THE TONGUE EVERY 5 MINUTES AS NEEDED FOR CHEST PAIN 06/10/16  Yes Eileen Stanford, PA-C  oxyCODONE-acetaminophen (ROXICET) 5-325 MG tablet Take 1 tablet by mouth every 8 (eight) hours as needed for severe pain. 07/08/16  Yes [name], MD  Polyethyl Glycol-Propyl Glycol (SYSTANE) 0.4-0.3 % SOLN Apply 1-2 drops to eye 3 (three) times daily as needed (for dry eyes).   Yes [provider]  polyethylene glycol (MIRALAX / GLYCOLAX) packet Take 17 g by mouth daily as needed (constipation). Mix in 8 oz liquid and drink   Yes [provider]  rosuvastatin (CRESTOR) 10 MG tablet Take 1 tablet (10 mg total) by mouth daily. 06/25/16  Yes [name], MD  triamcinolone (NASACORT AQ) 55 MCG/ACT AERO nasal inhaler Place 2 sprays into the nose daily.    Yes [provider]  vitamin B-12 (CYANOCOBALAMIN) 1000 MCG tablet Take 1,000 mcg by mouth daily.   Yes [provider]    Physical Exam: Vitals:   07/14/16 2245 07/14/16 2251 07/14/16 2300 07/14/16 2315  BP: 116/85  (!) 156/84 (!) 151/84  Pulse: 92  90 91  Resp: (!) 25  (!) 21 16  Temp:  99.3 F (37.4 C)    TempSrc:  Oral    SpO2: 98%  98% 94%  Weight:      Height:        Constitutional: NAD, calm, comfortable Vitals:   07/14/16 2245 07/14/16 2251 07/14/16 2300 07/14/16 2315  BP: 116/85  (!) 156/84 (!) 151/84  Pulse: 92  90 91  Resp: (!) 25  (!) 21 16  Temp:  99.3 F (37.4 C)    TempSrc:  Oral    SpO2: 98%  98% 94%  Weight:      Height:       Eyes: PERRL, lids and conjunctivae normal ENMT: Mucous membranes are moist. Posterior pharynx clear of any exudate or lesions.Normal dentition.  Neck: normal, supple, no masses, no thyromegaly Respiratory: clear to auscultation  bilaterally, no wheezing, no crackles. Normal  respiratory effort. No accessory muscle use.  Cardiovascular: Regular rate and rhythm, no murmurs / rubs / gallops. No extremity edema. 2+ pedal pulses. No carotid bruits.  Abdomen: no tenderness, no masses palpated. No hepatosplenomegaly. Bowel sounds positive.  Musculoskeletal: no clubbing / cyanosis. No joint deformity upper and lower extremities. Good ROM, no contractures. Normal muscle tone.  Skin: no rashes, lesions, ulcers. No induration Neurologic: CN 2-12 grossly intact. Sensation intact, DTR normal. Strength 5/5 in all 4.  Psychiatric: Normal judgment and insight. Alert and oriented x 3. Normal mood.    Labs on Admission: I have personally reviewed following labs and imaging studies  CBC:  Recent Labs Lab 07/14/16 2100  WBC 16.9*  NEUTROABS 15.2*  HGB 11.3*  HCT 34.6*  MCV 88.3  PLT 782*   Basic Metabolic Panel:  Recent Labs Lab 07/14/16 2100  NA 137  K 4.1  CL 110  CO2 19*  GLUCOSE 167*  BUN 19  CREATININE 1.12  CALCIUM 8.7*   GFR: Estimated Creatinine Clearance: 55.1 mL/min (by C-G formula based on SCr of 1.12 mg/dL). Liver Function Tests:  Recent Labs Lab 07/14/16 2100  AST 17  ALT 10*  ALKPHOS 64  BILITOT 0.7  PROT 6.0*  ALBUMIN 3.1*   Coagulation Profile:  Recent Labs Lab 07/14/16 2100  INR 1.12   Urine analysis:    Component Value Date/Time   COLORURINE COLORLESS (A) 07/14/2016 2220   APPEARANCEUR CLEAR 07/14/2016 2220   LABSPEC 1.004 (L) 07/14/2016 2220   PHURINE 6.0 07/14/2016 2220   GLUCOSEU NEGATIVE 07/14/2016 2220   GLUCOSEU NEGATIVE 12/15/2015 1028   HGBUR NEGATIVE 07/14/2016 2220   BILIRUBINUR NEGATIVE 07/14/2016 2220   KETONESUR NEGATIVE 07/14/2016 2220   PROTEINUR NEGATIVE 07/14/2016 2220   UROBILINOGEN 1.0 12/15/2015 1028   NITRITE NEGATIVE 07/14/2016 2220   LEUKOCYTESUR NEGATIVE 07/14/2016 2220    Recent Results (from the past 240 hour(s))  Culture, blood (Routine x 2)     Status: None (Preliminary result)    Collection Time: 07/14/16  9:00 PM  Result Value Ref Range Status   Specimen Description BLOOD LEFT ARM  Final   Special Requests   Final    BOTTLES DRAWN AEROBIC AND ANAEROBIC Blood Culture adequate volume   Culture PENDING  Incomplete   Report Status PENDING  Incomplete  Culture, blood (Routine x 2)     Status: None (Preliminary result)   Collection Time: 07/14/16  9:13 PM  Result Value Ref Range Status   Specimen Description BLOOD RIGHT ARM  Final   Special Requests   Final    BOTTLES DRAWN AEROBIC AND ANAEROBIC Blood Culture adequate volume   Culture PENDING  Incomplete   Report Status PENDING  Incomplete     Radiological Exams on Admission: Dg Chest 2 View  Result Date: 07/14/2016 CLINICAL DATA:  Cough and mental status changes.  Fever. EXAM: CHEST  2 VIEW COMPARISON:  01/04/2016 FINDINGS: Prior median sternotomy. Apical lordotic frontal radiographs. Midline trachea. Moderate cardiomegaly. Atherosclerosis in the transverse aorta. No pleural effusion or pneumothorax. Hyperinflation. No congestive failure. No lobar consolidation. IMPRESSION: Cardiomegaly and hyperinflation, without acute disease. Electronically Signed   By: Abigail Miyamoto M.D.   On: 07/14/2016 21:55    Assessment/Plan 83 yo male with fever of unclear etiology  Principal Problem:   Fever-  ua neg.  cxr neg.  No rashes.  Vitals all stable, lactic slightly elevated.  Given 3 liters ivf bolus in ED.  No further fluids at this time as vitals are all stable.  Pt appears nontoxic.  Flu pending.  Place on pcn allergic protocol vanc/levo/azactam until cx data available.  Active Problems:   Type II diabetes mellitus, uncontrolled ([place])- ssi   Mitral regurgitation and aortic stenosis- noted   CKD (chronic kidney disease), stage III- noted, at baseline   S/P CABG (coronary artery bypass graft)- noted   Persistent atrial fibrillation ([place])- rate controlled, on eloquis   Dementia- stable  Med rec pending   DVT prophylaxis:  eloquis  Code Status:  full Family Communication: wife  Disposition Plan:  Per day team Consults called:  none Admission status:  admit   Carmen Vallecillo A MD [facility]  If 7PM-7AM, please contact night-coverage [email or web address] Password [email or web address]  07/14/2016, 11:43 PM

## 2016-07-14 NOTE — ED Notes (Signed)
Pt reports 7/10 sharp left sided chest pain and increased sob.  EKG repeated and shown to Dr. Jeneen Rinks.  Baker Janus, NP notified.  O2 sats remain normal.

## 2016-07-14 NOTE — ED Notes (Addendum)
Pt had another episode of 7/10 sharp L sided chest pain.  EKG repeated and Dr. Jeneen Rinks notified and shown EKG.  Pt used urinal and got diaper wet in the process.  Adult diaper changed by Prom, EMT.  Linens were still dry. Extra chux placed under patient.

## 2016-07-14 NOTE — ED Notes (Signed)
Date and time results received: 07/14/16 9:18 PM (use smartphrase ".now" to insert current time)  Test: laxtic acid Critical Value: 2.34  Name of Provider Notified: Jeneen Rinks MD  Orders Received? Or Actions Taken?: no new orders

## 2016-07-15 ENCOUNTER — Encounter (HOSPITAL_COMMUNITY): Payer: Self-pay | Admitting: *Deleted

## 2016-07-15 DIAGNOSIS — Z809 Family history of malignant neoplasm, unspecified: Secondary | ICD-10-CM

## 2016-07-15 DIAGNOSIS — Z833 Family history of diabetes mellitus: Secondary | ICD-10-CM

## 2016-07-15 DIAGNOSIS — Z823 Family history of stroke: Secondary | ICD-10-CM

## 2016-07-15 DIAGNOSIS — Z8249 Family history of ischemic heart disease and other diseases of the circulatory system: Secondary | ICD-10-CM

## 2016-07-15 DIAGNOSIS — Z888 Allergy status to other drugs, medicaments and biological substances status: Secondary | ICD-10-CM

## 2016-07-15 DIAGNOSIS — Z880 Allergy status to penicillin: Secondary | ICD-10-CM

## 2016-07-15 DIAGNOSIS — Z7901 Long term (current) use of anticoagulants: Secondary | ICD-10-CM

## 2016-07-15 DIAGNOSIS — Z8546 Personal history of malignant neoplasm of prostate: Secondary | ICD-10-CM

## 2016-07-15 DIAGNOSIS — I2510 Atherosclerotic heart disease of native coronary artery without angina pectoris: Secondary | ICD-10-CM

## 2016-07-15 DIAGNOSIS — I4891 Unspecified atrial fibrillation: Secondary | ICD-10-CM

## 2016-07-15 DIAGNOSIS — E119 Type 2 diabetes mellitus without complications: Secondary | ICD-10-CM

## 2016-07-15 DIAGNOSIS — A419 Sepsis, unspecified organism: Secondary | ICD-10-CM

## 2016-07-15 LAB — COMPREHENSIVE METABOLIC PANEL
ALBUMIN: 2.9 g/dL — AB (ref 3.5–5.0)
ALT: 15 U/L — ABNORMAL LOW (ref 17–63)
ANION GAP: 7 (ref 5–15)
AST: 20 U/L (ref 15–41)
Alkaline Phosphatase: 58 U/L (ref 38–126)
BUN: 17 mg/dL (ref 6–20)
CO2: 20 mmol/L — AB (ref 22–32)
Calcium: 8 mg/dL — ABNORMAL LOW (ref 8.9–10.3)
Chloride: 113 mmol/L — ABNORMAL HIGH (ref 101–111)
Creatinine, Ser: 1.1 mg/dL (ref 0.61–1.24)
GFR calc non Af Amer: >60 mL/min (ref >60)
GLUCOSE: 164 mg/dL — AB (ref 65–99)
POTASSIUM: 4.2 mmol/L (ref 3.5–5.1)
Sodium: 140 mmol/L (ref 135–145)
TOTAL PROTEIN: 5.5 g/dL — AB (ref 6.5–8.1)
Total Bilirubin: 0.5 mg/dL (ref 0.3–1.2)

## 2016-07-15 LAB — CBC WITH DIFFERENTIAL/PLATELET
BASOS PCT: 0 %
Basophils Absolute: 0 10*3/uL (ref 0.0–0.1)
EOS ABS: 0 10*3/uL (ref 0.0–0.7)
Eosinophils Relative: 0 %
HCT: 33.9 % — ABNORMAL LOW (ref 39.0–52.0)
Hemoglobin: 10.6 g/dL — ABNORMAL LOW (ref 13.0–17.0)
Lymphocytes Relative: 4 %
Lymphs Abs: 0.8 10*3/uL (ref 0.7–4.0)
MCH: 27.7 pg (ref 26.0–34.0)
MCHC: 31.3 g/dL (ref 30.0–36.0)
MCV: 88.5 fL (ref 78.0–100.0)
MONOS PCT: 7 %
Monocytes Absolute: 1.4 10*3/uL — ABNORMAL HIGH (ref 0.1–1.0)
Neutro Abs: 17.9 10*3/uL — ABNORMAL HIGH (ref 1.7–7.7)
Neutrophils Relative %: 89 %
Platelets: 129 10*3/uL — ABNORMAL LOW (ref 150–400)
RBC: 3.83 MIL/uL — ABNORMAL LOW (ref 4.22–5.81)
RDW: 15.8 % — AB (ref 11.5–15.5)
WBC: 20.1 10*3/uL — ABNORMAL HIGH (ref 4.0–10.5)

## 2016-07-15 LAB — GLUCOSE, CAPILLARY
GLUCOSE-CAPILLARY: 156 mg/dL — AB (ref 65–99)
GLUCOSE-CAPILLARY: 133 mg/dL — AB (ref 65–99)
GLUCOSE-CAPILLARY: 154 mg/dL — AB (ref 65–99)
GLUCOSE-CAPILLARY: 145 mg/dL — AB (ref 65–99)
Glucose-Capillary: 140 mg/dL — ABNORMAL HIGH (ref 65–99)

## 2016-07-15 LAB — BLOOD CULTURE ID PANEL (REFLEXED)
Acinetobacter baumannii: NOT DETECTED
CANDIDA TROPICALIS: NOT DETECTED
Candida albicans: NOT DETECTED
Candida glabrata: NOT DETECTED
Candida krusei: NOT DETECTED
Candida parapsilosis: NOT DETECTED
Enterobacter cloacae complex: NOT DETECTED
Enterobacteriaceae species: NOT DETECTED
Enterococcus species: NOT DETECTED
Escherichia coli: NOT DETECTED
HAEMOPHILUS INFLUENZAE: NOT DETECTED
KLEBSIELLA PNEUMONIAE: NOT DETECTED
Klebsiella oxytoca: NOT DETECTED
Listeria monocytogenes: NOT DETECTED
METHICILLIN RESISTANCE: NOT DETECTED
NEISSERIA MENINGITIDIS: NOT DETECTED
PROTEUS SPECIES: NOT DETECTED
Pseudomonas aeruginosa: NOT DETECTED
STAPHYLOCOCCUS SPECIES: DETECTED — AB
STREPTOCOCCUS SPECIES: NOT DETECTED
Serratia marcescens: NOT DETECTED
Staphylococcus aureus (BCID): DETECTED — AB
Streptococcus agalactiae: NOT DETECTED
Streptococcus pneumoniae: NOT DETECTED
Streptococcus pyogenes: NOT DETECTED

## 2016-07-15 LAB — CBC
HCT: 34.1 % — ABNORMAL LOW (ref 39.0–52.0)
Hemoglobin: 10.6 g/dL — ABNORMAL LOW (ref 13.0–17.0)
MCH: 27.5 pg (ref 26.0–34.0)
MCHC: 31.1 g/dL (ref 30.0–36.0)
MCV: 88.3 fL (ref 78.0–100.0)
Platelets: 127 10*3/uL — ABNORMAL LOW (ref 150–400)
RBC: 3.86 MIL/uL — AB (ref 4.22–5.81)
RDW: 15.9 % — ABNORMAL HIGH (ref 11.5–15.5)
WBC: 20.8 10*3/uL — AB (ref 4.0–10.5)

## 2016-07-15 LAB — LACTIC ACID, PLASMA
Lactic Acid, Venous: 1.8 mmol/L (ref 0.5–1.9)
Lactic Acid, Venous: 1.9 mmol/L (ref 0.5–1.9)

## 2016-07-15 LAB — PROCALCITONIN: Procalcitonin: 0.87 ng/mL

## 2016-07-15 LAB — BASIC METABOLIC PANEL
Anion gap: 7 (ref 5–15)
BUN: 17 mg/dL (ref 6–20)
CALCIUM: 8 mg/dL — AB (ref 8.9–10.3)
CHLORIDE: 114 mmol/L — AB (ref 101–111)
CO2: 19 mmol/L — ABNORMAL LOW (ref 22–32)
CREATININE: 1.09 mg/dL (ref 0.61–1.24)
Glucose, Bld: 149 mg/dL — ABNORMAL HIGH (ref 65–99)
Potassium: 4.4 mmol/L (ref 3.5–5.1)
SODIUM: 140 mmol/L (ref 135–145)

## 2016-07-15 LAB — TROPONIN I: TROPONIN I: 0.05 ng/mL — AB (ref <0.03)

## 2016-07-15 LAB — INFLUENZA PANEL BY PCR (TYPE A & B)
Influenza A By PCR: NEGATIVE
Influenza B By PCR: NEGATIVE

## 2016-07-15 LAB — CG4 I-STAT (LACTIC ACID): Lactic Acid, Venous: 2.05 mmol/L (ref 0.5–1.9)

## 2016-07-15 MED ORDER — ASPIRIN EC 81 MG PO TBEC
81.0000 mg | DELAYED_RELEASE_TABLET | Freq: Every day | ORAL | Status: DC
  Administered 2016-07-15 – 2016-07-19 (×5): 81 mg via ORAL
  Filled 2016-07-15 (×5): qty 1

## 2016-07-15 MED ORDER — ROSUVASTATIN CALCIUM 10 MG PO TABS
10.0000 mg | ORAL_TABLET | Freq: Every day | ORAL | Status: DC
  Administered 2016-07-15 – 2016-07-18 (×5): 10 mg via ORAL
  Filled 2016-07-15 (×5): qty 1

## 2016-07-15 MED ORDER — SODIUM CHLORIDE 0.9% FLUSH: 3.0000 mL | INTRAVENOUS | Status: DC | PRN

## 2016-07-15 MED ORDER — LISINOPRIL 20 MG PO TABS
20.0000 mg | ORAL_TABLET | Freq: Every day | ORAL | Status: DC
Start: 2016-07-15 — End: 2016-07-19
  Administered 2016-07-15 – 2016-07-19 (×5): 20 mg via ORAL
  Filled 2016-07-15 (×6): qty 1

## 2016-07-15 MED ORDER — DEXTROSE 5 % IV SOLN
2.0000 g | Freq: Three times a day (TID) | INTRAVENOUS | Status: DC
  Administered 2016-07-15: 2 g via INTRAVENOUS
  Filled 2016-07-15 (×3): qty 2

## 2016-07-15 MED ORDER — POLYETHYLENE GLYCOL 3350 17 G PO PACK: 17.0000 g | PACK | Freq: Every day | ORAL | Status: DC | PRN

## 2016-07-15 MED ORDER — POLYVINYL ALCOHOL 1.4 % OP SOLN
1.0000 [drp] | Freq: Three times a day (TID) | OPHTHALMIC | Status: DC | PRN
  Administered 2016-07-19: 2 [drp] via OPHTHALMIC
  Filled 2016-07-15: qty 15

## 2016-07-15 MED ORDER — LEVOTHYROXINE SODIUM 112 MCG PO TABS
112.0000 ug | ORAL_TABLET | Freq: Every day | ORAL | Status: DC
  Administered 2016-07-15 – 2016-07-19 (×5): 112 ug via ORAL
  Filled 2016-07-15 (×4): qty 1

## 2016-07-15 MED ORDER — LEVOFLOXACIN IN D5W 750 MG/150ML IV SOLN: 750.0000 mg | Freq: Every day | INTRAVENOUS | Status: DC

## 2016-07-15 MED ORDER — SODIUM CHLORIDE 0.9 % IV SOLN: 250.0000 mL | INTRAVENOUS | Status: DC | PRN

## 2016-07-15 MED ORDER — FERROUS SULFATE 325 (65 FE) MG PO TABS
325.0000 mg | ORAL_TABLET | Freq: Every day | ORAL | Status: DC
  Administered 2016-07-15 – 2016-07-18 (×4): 325 mg via ORAL
  Filled 2016-07-15 (×5): qty 1

## 2016-07-15 MED ORDER — ALBUTEROL SULFATE (2.5 MG/3ML) 0.083% IN NEBU
2.5000 mg | INHALATION_SOLUTION | RESPIRATORY_TRACT | Status: DC | PRN
  Administered 2016-07-15: 2.5 mg via RESPIRATORY_TRACT
  Filled 2016-07-15: qty 3

## 2016-07-15 MED ORDER — SODIUM CHLORIDE 0.9% FLUSH
3.0000 mL | Freq: Two times a day (BID) | INTRAVENOUS | Status: DC
  Administered 2016-07-15 – 2016-07-19 (×10): 3 mL via INTRAVENOUS

## 2016-07-15 MED ORDER — GUAIFENESIN ER 600 MG PO TB12
600.0000 mg | ORAL_TABLET | Freq: Two times a day (BID) | ORAL | Status: DC
  Administered 2016-07-15 – 2016-07-19 (×10): 600 mg via ORAL
  Filled 2016-07-15 (×10): qty 1

## 2016-07-15 MED ORDER — ASPIRIN 325 MG PO TABS
325.0000 mg | ORAL_TABLET | Freq: Once | ORAL | Status: AC
  Administered 2016-07-15: 325 mg via ORAL
  Filled 2016-07-15: qty 1

## 2016-07-15 MED ORDER — APIXABAN 5 MG PO TABS: 5.0000 mg | ORAL_TABLET | Freq: Two times a day (BID) | ORAL | Status: DC

## 2016-07-15 MED ORDER — ONDANSETRON HCL 4 MG PO TABS: 4.0000 mg | ORAL_TABLET | Freq: Four times a day (QID) | ORAL | Status: DC | PRN

## 2016-07-15 MED ORDER — FINASTERIDE 5 MG PO TABS
5.0000 mg | ORAL_TABLET | Freq: Every day | ORAL | Status: DC
  Administered 2016-07-15 – 2016-07-19 (×5): 5 mg via ORAL
  Filled 2016-07-15 (×5): qty 1

## 2016-07-15 MED ORDER — VANCOMYCIN HCL IN DEXTROSE 750-5 MG/150ML-% IV SOLN
750.0000 mg | Freq: Two times a day (BID) | INTRAVENOUS | Status: DC
  Administered 2016-07-15 – 2016-07-19 (×9): 750 mg via INTRAVENOUS
  Filled 2016-07-15 (×11): qty 150

## 2016-07-15 MED ORDER — TRIAMCINOLONE ACETONIDE 55 MCG/ACT NA AERO
2.0000 | INHALATION_SPRAY | Freq: Every day | NASAL | Status: DC
  Administered 2016-07-15 – 2016-07-19 (×5): 2 via NASAL
  Filled 2016-07-15 (×2): qty 21.6

## 2016-07-15 MED ORDER — ALBUTEROL SULFATE (2.5 MG/3ML) 0.083% IN NEBU: 2.5000 mg | INHALATION_SOLUTION | Freq: Four times a day (QID) | RESPIRATORY_TRACT | Status: DC

## 2016-07-15 MED ORDER — ACETAMINOPHEN 500 MG PO TABS
500.0000 mg | ORAL_TABLET | Freq: Three times a day (TID) | ORAL | Status: DC | PRN
  Administered 2016-07-15 – 2016-07-16 (×2): 500 mg via ORAL
  Filled 2016-07-15 (×2): qty 1

## 2016-07-15 MED ORDER — VITAMIN B-12 1000 MCG PO TABS
1000.0000 ug | ORAL_TABLET | Freq: Every day | ORAL | Status: DC
  Administered 2016-07-15 – 2016-07-19 (×5): 1000 ug via ORAL
  Filled 2016-07-15 (×5): qty 1

## 2016-07-15 MED ORDER — ABSORBINE JR EX LIQD: 1.0000 "application " | Freq: Three times a day (TID) | CUTANEOUS | Status: DC | PRN

## 2016-07-15 MED ORDER — IPRATROPIUM BROMIDE 0.02 % IN SOLN: 0.5000 mg | Freq: Four times a day (QID) | RESPIRATORY_TRACT | Status: DC

## 2016-07-15 MED ORDER — OXYCODONE-ACETAMINOPHEN 5-325 MG PO TABS
1.0000 | ORAL_TABLET | Freq: Three times a day (TID) | ORAL | Status: DC | PRN
  Administered 2016-07-15 – 2016-07-19 (×7): 1 via ORAL
  Filled 2016-07-15 (×7): qty 1

## 2016-07-15 MED ORDER — ONDANSETRON HCL 4 MG/2ML IJ SOLN: 4.0000 mg | Freq: Four times a day (QID) | INTRAMUSCULAR | Status: DC | PRN

## 2016-07-15 MED ORDER — INSULIN ASPART 100 UNIT/ML ~~LOC~~ SOLN
0.0000 [IU] | Freq: Three times a day (TID) | SUBCUTANEOUS | Status: DC
  Administered 2016-07-15: 2 [IU] via SUBCUTANEOUS
  Administered 2016-07-15 – 2016-07-16 (×3): 1 [IU] via SUBCUTANEOUS
  Administered 2016-07-16: 3 [IU] via SUBCUTANEOUS
  Administered 2016-07-17 – 2016-07-18 (×4): 2 [IU] via SUBCUTANEOUS
  Administered 2016-07-18 – 2016-07-19 (×2): 3 [IU] via SUBCUTANEOUS
  Administered 2016-07-19: 2 [IU] via SUBCUTANEOUS

## 2016-07-15 NOTE — Consult Note (Addendum)
[facility]  Date of Admission:  07/14/2016  Date of Consult:  07/15/2016  Reason for Consult: MSSA Blood Culture Referring Physician: Dr. Sheran Fava  Impression/Recommendation MSSA Bacteremia Fever CAD s/p CABG HTN DM Atrial Fibrillation  MSSA Bacteremia: Cultures drawn 07/14/16 are growing MSSA. He does not have any obvious source of infection. He denies any history of surgical hardware placement. He did have a TEE in October 2018 which showed changes consistent with aortic stenosis and mitral valve regurgitation without any vegetations. He is s/p CABG in September 2017 and appears that he did not have prosthetic valves placed. He does have a penicillin allergy which he reports caused facial, lip, and throat swelling. He does not appear to have used any cephalosporins before. TTE this admission is pending. We will narrow his antibiotics to Vancomycin alone given his penicillin allergy and await further evaluation with echocardiogram before determining duration of antibiotics. If TTE is negative for vegetations, he will need to have a TEE. - Continue Vancomycin alone given Penicillin allergy - D/c Aztreonam - f/u TTE --> will need TEE if TTE negative - repeat blood cultures 48 hours after initial therapy  Thank you so much for this interesting consult,   Zada Finders, MD Internal Medicine PGY-2 [email or web address]   HPI: Barry MESTRE Sr. is an 83 y.o. male with PMH of Dementia, CAD s/p CABG in Sept 2017, Atrial Fibrillation on Eliquis, HTN, DM, and prostate cancer who was admitted for fevers, malaise, and chest pain and started on treatment for suspected community acquired pneumonia. He reports a chronic cough productive of slight white sputum which is unchanged from his baseline. He denies any current chest pain, palpitations, or dyspnea. He denies any abdominal pain, nausea, vomiting, diarrhea, constipation, or skin changes/wounds. He says he is urinating  more frequently than usual and had some burning sensation with urination today.  Workup on admission was notable for a leukocytosis of 16.9, lactic acid 2.34, procalcitonin 0.87, and negative influenza panel. CXR was read as cardiomegaly with hyperinflation, no acute disease. There was concern for a left lower lobe infiltrate and patient was started on Vancomycin, Levofloxacin, and Aztreonam due to history of penicillin allergy. Levofloxacin has now been discontinued.  Blood cultures drawn 5/6 are growing MSSA by BCID in the aerobic bottle from the left arm. Second set of blood cultures is negative. ID has been consulted due to MSSA Bacteremia.   Past Medical History:  Diagnosis Date  . Anemia    hx of year ago no problems now per pt   . Arthritis    generalized   . Asthma   . Atypical chest pain   . Bladder neck contracture   . Chronic back pain   . Complication of anesthesia    POST OP HYPOTENSION  . Dementia    per wife pt had neurological evaluation  . Diverticulosis of colon   . First degree heart block   . GERD (gastroesophageal reflux disease)   . Heart murmur   . History of basal cell carcinoma excision    forehead and x6 area forearms  . History of Bell's palsy    x2  1998 &  2004 left side --  residual slight left eye droop and vace  . History of chronic bronchitis   . History of colon polyps   . History of gout   . History of hypertension   . History of MI (myocardial infarction)    08/ 2010  secondary  acute renal failure/ dehydration--  medical management  . History of pulmonary embolus (PE)    2010  . Hypogonadism male   . Hypotension   . Hypothyroidism   . OSA (obstructive sleep apnea)    severe per study 2007/  non-compliant cpap  . Peripheral vascular disease ([place])   . Prostate carcinoma [facility]) urologist-  dr Gaynelle Arabian   DX 2007  Gleason 6  and recurrent 2012  s/p  cryoablation of prostate both times  . RBBB (right bundle branch block)   . Recovering  alcoholic in remission ([place])   . Renal calculus, bilateral   . Type 2 diabetes mellitus ([place])     Past Surgical History:  Procedure Laterality Date  . CARDIAC CATHETERIZATION N/A 11/27/2015   Procedure: Left Heart Cath and Coronary Angiography;  Surgeon: Peter M [name], MD;  Location: [facility];  Service: Cardiovascular;  Laterality: N/A;  . CARDIOVASCULAR STRESS TEST  05-19-2012  dr Tressia Miners turner   normal perfusionstudy/  no ischemia/  attenuation artifact inferoapex region of myocardium/  note gate secondary to rhythm irregularity  . CARDIOVERSION N/A 01/05/2016   Procedure: CARDIOVERSION;  Surgeon: Sanda Klein, MD;  Location: [facility];  Service: Cardiovascular;  Laterality: N/A;  . CATARACT EXTRACTION W/ INTRAOCULAR LENS IMPLANT Right 2014  . COLONOSCOPY W/ POLYPECTOMY  last one 08-10-2013  . CORONARY ARTERY BYPASS GRAFT N/A 11/29/2015   Procedure: CORONARY ARTERY BYPASS GRAFTING (CABG), ON PUMP, TIMES FIVE, USING LEFT INTERNAL MAMMARY ARTERY, RIGHT GREATER SAPHENOUS VEIN HARVESTED ENDOSCOPICALLY;  Surgeon: [name], MD;  Location: [facility];  Service: Open Heart Surgery;  Laterality: N/A;  LIMA-LAD; SEQ SVG-OM1-OM2;SVG-DIAG; SVG-PL  . CYSTOSCOPY WITH URETHRAL DILATATION N/A 01/14/2014   Procedure: URETHRAL MEATAL DILATATION;  Surgeon: Ailene Rud, MD;  Location: [facility];  Service: Urology;  Laterality: N/A;  . EXCISION BENIGN SKIN LESION OF BACK  10/ 2011  . HIATAL HERNIA REPAIR  1983  . INGUINAL HERNIA REPAIR Right 10-14-2001  . INTRAOPERATIVE TRANSESOPHAGEAL ECHOCARDIOGRAM N/A 11/29/2015   Procedure: INTRAOPERATIVE TRANSESOPHAGEAL ECHOCARDIOGRAM;  Surgeon: [name], MD;  Location: [facility];  Service: Open Heart Surgery;  Laterality: N/A;  . KNEE ARTHROSCOPY W/ MENISCECTOMY Right 01-25-2003   and chondroplasty  . [place]  . PROSTATE CRYOABLATION  09-17-2010  &  04-12-2005  . REPAIR RIGHT ARM TENDON INJURY  1975    . SHOULDER OPEN ROTATOR CUFF REPAIR  03/20/2011   Procedure: ROTATOR CUFF REPAIR SHOULDER OPEN;  Surgeon: Tobi Bastos;  Location: [facility];  Service: Orthopedics;  Laterality: Right;  . TEE WITHOUT CARDIOVERSION N/A 01/05/2016   Procedure: TRANSESOPHAGEAL ECHOCARDIOGRAM (TEE);  Surgeon: Sanda Klein, MD;  Location: [facility];  Service: Cardiovascular;  Laterality: N/A;  . TONSILLECTOMY  as child  . TRANSTHORACIC ECHOCARDIOGRAM  01-11-2013  dr [name]   moderate LVH/  grade I diastolic dysfunction/ ef 40-81%/  mild AV stenosis /  mild MR/  mild LAE  . TRANSURETHRAL RESECTION OF BLADDER TUMOR N/A 01/14/2014   Procedure: TRANSURETHRAL INCISION OF BLADDER NECK CONTRACTURE;  Surgeon: Ailene Rud, MD;  Location: [facility]  Service: Urology;  Laterality: N/A;  . Vantus Implant     Hormones for Prostate  . VENTRAL HERNIA REPAIR  1998     Allergies  Allergen Reactions  . Penicillins Swelling    Eyes and lips Has patient had a PCN reaction causing immediate rash, facial/tongue/throat swelling, SOB or lightheadedness with  hypotension: Yes Has patient had a PCN reaction causing severe rash involving mucus membranes or skin necrosis: No Has patient had a PCN reaction that required hospitalization No Has patient had a PCN reaction occurring within the last 10 years: No If all of the above answers are "NO", then may proceed with Cephalosporin use   . Prednisone Other (See Comments)    Hyperglycemia and AMS    Medications: I have reviewed the patient's current medications.  Abtx:  Anti-infectives    Start     Dose/Rate Route Frequency Ordered Stop   07/15/16 2200  levofloxacin (LEVAQUIN) IVPB 750 mg  Status:  Discontinued     750 mg 100 mL/hr over 90 Minutes Intravenous Daily at bedtime 07/15/16 0434 07/15/16 0929   07/15/16 0900  vancomycin (VANCOCIN) IVPB 750 mg/150 ml premix     750 mg 150 mL/hr over 60 Minutes Intravenous Every 12 hours 07/15/16 0434      07/15/16 0800  aztreonam (AZACTAM) 2 g in dextrose 5 % 50 mL IVPB  Status:  Discontinued     2 g 100 mL/hr over 30 Minutes Intravenous Every 8 hours 07/15/16 0434 07/15/16 1653   07/14/16 2115  levofloxacin (LEVAQUIN) IVPB 750 mg     750 mg 100 mL/hr over 90 Minutes Intravenous  Once 07/14/16 2103 07/14/16 2251   07/14/16 2115  aztreonam (AZACTAM) 2 g in dextrose 5 % 50 mL IVPB     2 g 100 mL/hr over 30 Minutes Intravenous  Once 07/14/16 2103 07/14/16 2320   07/14/16 2115  vancomycin (VANCOCIN) IVPB 1000 mg/200 mL premix     1,000 mg 200 mL/hr over 60 Minutes Intravenous  Once 07/14/16 2103 07/14/16 2245      Total days of antibiotics: 2 Vancomycin, Aztreonam; 1 Levofloxacin (5/6)          Social History:  reports that he has never smoked. He has never used smokeless tobacco. He reports that he is a recovering alcoholic with last use in 1972. He denies IV drug use.  Family History  Problem Relation Age of Onset  . Heart disease Mother   . Stroke Father   . Cancer Brother     unsure of type  . Cancer Sister   . Diabetes Sister   . Diabetes Sister   . Stroke Brother   . Cancer Brother   . Cancer Brother   . Cancer Brother   . Cancer Brother   . Cancer Brother     Review of Systems  Constitutional: Positive for chills, diaphoresis and fever.  HENT: Positive for congestion.   Respiratory: Negative for hemoptysis and shortness of breath.        Chronic cough  Cardiovascular: Negative for chest pain, palpitations and leg swelling.  Gastrointestinal: Negative for abdominal pain, constipation, diarrhea, nausea and vomiting.  Genitourinary: Positive for dysuria and frequency.  Skin: Negative for itching and rash.     Blood pressure (!) 129/54, pulse 77, temperature 98 F (36.7 C), temperature source Oral, resp. rate 18, height '5\' 11"'$  (1.803 m), weight 189 lb 3.2 oz (85.8 kg), SpO2 98 %. Physical Exam  Constitutional: He is oriented to person, place, and time. No distress.    HENT:  Head: Normocephalic and atraumatic.  Cardiovascular: Normal rate and regular rhythm.   2/6 Systolic murmur heard best at left lower sternal border  Pulmonary/Chest: Effort normal. No respiratory distress. He has no wheezes. He has no rales.  Abdominal: Soft. He exhibits no distension. There is  no tenderness.  Musculoskeletal: He exhibits no edema or tenderness.  Neurological: He is alert and oriented to person, place, and time.  Skin: Skin is warm. He is not diaphoretic.     Results for orders placed or performed during the hospital encounter of 07/14/16 (from the past 48 hour(s))  Comprehensive metabolic panel     Status: Abnormal   Collection Time: 07/14/16  9:00 PM  Result Value Ref Range   Sodium 137 135 - 145 mmol/L   Potassium 4.1 3.5 - 5.1 mmol/L   Chloride 110 101 - 111 mmol/L   CO2 19 (L) 22 - 32 mmol/L   Glucose, Bld 167 (H) 65 - 99 mg/dL   BUN 19 6 - 20 mg/dL   Creatinine, Ser 1.12 0.61 - 1.24 mg/dL   Calcium 8.7 (L) 8.9 - 10.3 mg/dL   Total Protein 6.0 (L) 6.5 - 8.1 g/dL   Albumin 3.1 (L) 3.5 - 5.0 g/dL   AST 17 15 - 41 U/L   ALT 10 (L) 17 - 63 U/L   Alkaline Phosphatase 64 38 - 126 U/L   Total Bilirubin 0.7 0.3 - 1.2 mg/dL   GFR calc non Af Amer 60 (L) >60 mL/min   GFR calc Af Amer >60 >60 mL/min    Comment: (NOTE) The eGFR has been calculated using the CKD EPI equation. This calculation has not been validated in all clinical situations. eGFR's persistently <60 mL/min signify possible Chronic Kidney Disease.    Anion gap 8 5 - 15  CBC with Differential     Status: Abnormal   Collection Time: 07/14/16  9:00 PM  Result Value Ref Range   WBC 16.9 (H) 4.0 - 10.5 K/uL   RBC 3.92 (L) 4.22 - 5.81 MIL/uL   Hemoglobin 11.3 (L) 13.0 - 17.0 g/dL   HCT 34.6 (L) 39.0 - 52.0 %   MCV 88.3 78.0 - 100.0 fL   MCH 28.8 26.0 - 34.0 pg   MCHC 32.7 30.0 - 36.0 g/dL   RDW 15.8 (H) 11.5 - 15.5 %   Platelets 124 (L) 150 - 400 K/uL   Neutrophils Relative % 90 %    Lymphocytes Relative 7 %   Monocytes Relative 3 %   Eosinophils Relative 0 %   Basophils Relative 0 %   Neutro Abs 15.2 (H) 1.7 - 7.7 K/uL   Lymphs Abs 1.2 0.7 - 4.0 K/uL   Monocytes Absolute 0.5 0.1 - 1.0 K/uL   Eosinophils Absolute 0.0 0.0 - 0.7 K/uL   Basophils Absolute 0.0 0.0 - 0.1 K/uL  Protime-INR     Status: None   Collection Time: 07/14/16  9:00 PM  Result Value Ref Range   Prothrombin Time 14.5 11.4 - 15.2 seconds   INR 1.12   Culture, blood (Routine x 2)     Status: None (Preliminary result)   Collection Time: 07/14/16  9:00 PM  Result Value Ref Range   Specimen Description BLOOD LEFT ARM    Special Requests      BOTTLES DRAWN AEROBIC AND ANAEROBIC Blood Culture adequate volume   Culture  Setup Time      GRAM POSITIVE COCCI IN CLUSTERS AEROBIC BOTTLE ONLY Organism ID to follow    Culture NO GROWTH < 24 HOURS    Report Status PENDING   Blood Culture ID Panel (Reflexed)     Status: Abnormal   Collection Time: 07/14/16  9:00 PM  Result Value Ref Range   Enterococcus species NOT DETECTED NOT DETECTED  Listeria monocytogenes NOT DETECTED NOT DETECTED   Staphylococcus species DETECTED (A) NOT DETECTED    Comment: CRITICAL RESULT CALLED TO, READ BACK BY AND VERIFIED WITH: N. Batchelder Pharm.D. 14:15 07/15/16 ([email or web address])    Staphylococcus aureus DETECTED (A) NOT DETECTED    Comment: Methicillin (oxacillin) susceptible Staphylococcus aureus (MSSA). Preferred therapy is anti staphylococcal beta lactam antibiotic (Cefazolin or Nafcillin), unless clinically contraindicated. CRITICAL RESULT CALLED TO, READ BACK BY AND VERIFIED WITH: N. Batchelder Pharm.D. 14:15 07/15/16 ([email or web address])    Methicillin resistance NOT DETECTED NOT DETECTED   Streptococcus species NOT DETECTED NOT DETECTED   Streptococcus agalactiae NOT DETECTED NOT DETECTED   Streptococcus pneumoniae NOT DETECTED NOT DETECTED   Streptococcus pyogenes NOT DETECTED NOT DETECTED   Acinetobacter baumannii NOT DETECTED NOT  DETECTED   Enterobacteriaceae species NOT DETECTED NOT DETECTED   Enterobacter cloacae complex NOT DETECTED NOT DETECTED   Escherichia coli NOT DETECTED NOT DETECTED   Klebsiella oxytoca NOT DETECTED NOT DETECTED   Klebsiella pneumoniae NOT DETECTED NOT DETECTED   Proteus species NOT DETECTED NOT DETECTED   Serratia marcescens NOT DETECTED NOT DETECTED   Haemophilus influenzae NOT DETECTED NOT DETECTED   Neisseria meningitidis NOT DETECTED NOT DETECTED   Pseudomonas aeruginosa NOT DETECTED NOT DETECTED   Candida albicans NOT DETECTED NOT DETECTED   Candida glabrata NOT DETECTED NOT DETECTED   Candida krusei NOT DETECTED NOT DETECTED   Candida parapsilosis NOT DETECTED NOT DETECTED   Candida tropicalis NOT DETECTED NOT DETECTED  I-stat troponin, ED (not at [facility], [facility])     Status: None   Collection Time: 07/14/16  9:10 PM  Result Value Ref Range   Troponin i, poc 0.02 0.00 - 0.08 ng/mL   Comment 3            Comment: Due to the release kinetics of cTnI, a negative result within the first hours of the onset of symptoms does not rule out myocardial infarction with certainty. If myocardial infarction is still suspected, repeat the test at appropriate intervals.   I-Stat CG4 Lactic Acid, ED     Status: Abnormal   Collection Time: 07/14/16  9:12 PM  Result Value Ref Range   Lactic Acid, Venous 2.34 (HH) 0.5 - 1.9 mmol/L   Comment NOTIFIED PHYSICIAN   Culture, blood (Routine x 2)     Status: None (Preliminary result)   Collection Time: 07/14/16  9:13 PM  Result Value Ref Range   Specimen Description BLOOD RIGHT ARM    Special Requests      BOTTLES DRAWN AEROBIC AND ANAEROBIC Blood Culture adequate volume   Culture NO GROWTH < 24 HOURS    Report Status PENDING   Urinalysis, Routine w reflex microscopic     Status: Abnormal   Collection Time: 07/14/16 10:20 PM  Result Value Ref Range   Color, Urine COLORLESS (A) YELLOW   APPearance CLEAR CLEAR   Specific Gravity, Urine 1.004 (L)  1.005 - 1.030   pH 6.0 5.0 - 8.0   Glucose, UA NEGATIVE NEGATIVE mg/dL   Hgb urine dipstick NEGATIVE NEGATIVE   Bilirubin Urine NEGATIVE NEGATIVE   Ketones, ur NEGATIVE NEGATIVE mg/dL   Protein, ur NEGATIVE NEGATIVE mg/dL   Nitrite NEGATIVE NEGATIVE   Leukocytes, UA NEGATIVE NEGATIVE  Influenza panel by PCR (type A & B)     Status: None   Collection Time: 07/14/16 11:45 PM  Result Value Ref Range   Influenza A By PCR NEGATIVE NEGATIVE   Influenza B By PCR NEGATIVE NEGATIVE  Comment: (NOTE) The Xpert Xpress Flu assay is intended as an aid in the diagnosis of  influenza and should not be used as a sole basis for treatment.  This  assay is FDA approved for nasopharyngeal swab specimens only. Nasal  washings and aspirates are unacceptable for Xpert Xpress Flu testing.   CG4 I-STAT (Lactic acid)     Status: Abnormal   Collection Time: 07/15/16 12:23 AM  Result Value Ref Range   Lactic Acid, Venous 2.05 (HH) 0.5 - 1.9 mmol/L   Comment NOTIFIED PHYSICIAN   CBC with Differential     Status: Abnormal   Collection Time: 07/15/16  1:21 AM  Result Value Ref Range   WBC 20.1 (H) 4.0 - 10.5 K/uL   RBC 3.83 (L) 4.22 - 5.81 MIL/uL   Hemoglobin 10.6 (L) 13.0 - 17.0 g/dL   HCT 33.9 (L) 39.0 - 52.0 %   MCV 88.5 78.0 - 100.0 fL   MCH 27.7 26.0 - 34.0 pg   MCHC 31.3 30.0 - 36.0 g/dL   RDW 15.8 (H) 11.5 - 15.5 %   Platelets 129 (L) 150 - 400 K/uL   Neutrophils Relative % 89 %   Neutro Abs 17.9 (H) 1.7 - 7.7 K/uL   Lymphocytes Relative 4 %   Lymphs Abs 0.8 0.7 - 4.0 K/uL   Monocytes Relative 7 %   Monocytes Absolute 1.4 (H) 0.1 - 1.0 K/uL   Eosinophils Relative 0 %   Eosinophils Absolute 0.0 0.0 - 0.7 K/uL   Basophils Relative 0 %   Basophils Absolute 0.0 0.0 - 0.1 K/uL  Comprehensive metabolic panel     Status: Abnormal   Collection Time: 07/15/16  1:21 AM  Result Value Ref Range   Sodium 140 135 - 145 mmol/L   Potassium 4.2 3.5 - 5.1 mmol/L   Chloride 113 (H) 101 - 111 mmol/L   CO2  20 (L) 22 - 32 mmol/L   Glucose, Bld 164 (H) 65 - 99 mg/dL   BUN 17 6 - 20 mg/dL   Creatinine, Ser 1.10 0.61 - 1.24 mg/dL   Calcium 8.0 (L) 8.9 - 10.3 mg/dL   Total Protein 5.5 (L) 6.5 - 8.1 g/dL   Albumin 2.9 (L) 3.5 - 5.0 g/dL   AST 20 15 - 41 U/L   ALT 15 (L) 17 - 63 U/L   Alkaline Phosphatase 58 38 - 126 U/L   Total Bilirubin 0.5 0.3 - 1.2 mg/dL   GFR calc non Af Amer >60 >60 mL/min   GFR calc Af Amer >60 >60 mL/min    Comment: (NOTE) The eGFR has been calculated using the CKD EPI equation. This calculation has not been validated in all clinical situations. eGFR's persistently <60 mL/min signify possible Chronic Kidney Disease.    Anion gap 7 5 - 15  Lactic acid, plasma     Status: None   Collection Time: 07/15/16  1:21 AM  Result Value Ref Range   Lactic Acid, Venous 1.8 0.5 - 1.9 mmol/L  Glucose, capillary     Status: Abnormal   Collection Time: 07/15/16  1:30 AM  Result Value Ref Range   Glucose-Capillary 156 (H) 65 - 99 mg/dL  Lactic acid, plasma     Status: None   Collection Time: 07/15/16  4:09 AM  Result Value Ref Range   Lactic Acid, Venous 1.9 0.5 - 1.9 mmol/L  Basic metabolic panel     Status: Abnormal   Collection Time: 07/15/16  4:09 AM  Result  Value Ref Range   Sodium 140 135 - 145 mmol/L   Potassium 4.4 3.5 - 5.1 mmol/L   Chloride 114 (H) 101 - 111 mmol/L   CO2 19 (L) 22 - 32 mmol/L   Glucose, Bld 149 (H) 65 - 99 mg/dL   BUN 17 6 - 20 mg/dL   Creatinine, Ser 1.09 0.61 - 1.24 mg/dL   Calcium 8.0 (L) 8.9 - 10.3 mg/dL   GFR calc non Af Amer >60 >60 mL/min   GFR calc Af Amer >60 >60 mL/min    Comment: (NOTE) The eGFR has been calculated using the CKD EPI equation. This calculation has not been validated in all clinical situations. eGFR's persistently <60 mL/min signify possible Chronic Kidney Disease.    Anion gap 7 5 - 15  CBC     Status: Abnormal   Collection Time: 07/15/16  4:09 AM  Result Value Ref Range   WBC 20.8 (H) 4.0 - 10.5 K/uL   RBC  3.86 (L) 4.22 - 5.81 MIL/uL   Hemoglobin 10.6 (L) 13.0 - 17.0 g/dL   HCT 34.1 (L) 39.0 - 52.0 %   MCV 88.3 78.0 - 100.0 fL   MCH 27.5 26.0 - 34.0 pg   MCHC 31.1 30.0 - 36.0 g/dL   RDW 15.9 (H) 11.5 - 15.5 %   Platelets 127 (L) 150 - 400 K/uL  Glucose, capillary     Status: Abnormal   Collection Time: 07/15/16  7:44 AM  Result Value Ref Range   Glucose-Capillary 133 (H) 65 - 99 mg/dL  Procalcitonin - Baseline     Status: None   Collection Time: 07/15/16 10:20 AM  Result Value Ref Range   Procalcitonin 0.87 ng/mL    Comment:        Interpretation: PCT > 0.5 ng/mL and <= 2 ng/mL: Systemic infection (sepsis) is possible, but other conditions are known to elevate PCT as well. (NOTE)         ICU PCT Algorithm               Non ICU PCT Algorithm    ----------------------------     ------------------------------         PCT < 0.25 ng/mL                 PCT < 0.1 ng/mL     Stopping of antibiotics            Stopping of antibiotics       strongly encouraged.               strongly encouraged.    ----------------------------     ------------------------------       PCT level decrease by               PCT < 0.25 ng/mL       >= 80% from peak PCT       OR PCT 0.25 - 0.5 ng/mL          Stopping of antibiotics                                             encouraged.     Stopping of antibiotics           encouraged.    ----------------------------     ------------------------------       PCT level decrease by  PCT >= 0.25 ng/mL       < 80% from peak PCT        AND PCT >= 0.5 ng/mL             Continuing antibiotics                                              encouraged.       Continuing antibiotics            encouraged.    ----------------------------     ------------------------------     PCT level increase compared          PCT > 0.5 ng/mL         with peak PCT AND          PCT >= 0.5 ng/mL             Escalation of antibiotics                                           strongly encouraged.      Escalation of antibiotics        strongly encouraged.   Troponin I     Status: Abnormal   Collection Time: 07/15/16 10:20 AM  Result Value Ref Range   Troponin I 0.05 (HH) <0.03 ng/mL    Comment: CRITICAL RESULT CALLED TO, READ BACK BY AND VERIFIED WITH: A.[email or web address],RN 1125 07/15/16 CLARK,S   Glucose, capillary     Status: Abnormal   Collection Time: 07/15/16 11:32 AM  Result Value Ref Range   Glucose-Capillary 154 (H) 65 - 99 mg/dL      Component Value Date/Time   SDES BLOOD RIGHT ARM 07/14/2016 2113   SPECREQUEST  07/14/2016 2113    BOTTLES DRAWN AEROBIC AND ANAEROBIC Blood Culture adequate volume   CULT NO GROWTH < 24 HOURS 07/14/2016 2113   REPTSTATUS PENDING 07/14/2016 2113   Dg Chest 2 View  Result Date: 07/14/2016 CLINICAL DATA:  Cough and mental status changes.  Fever. EXAM: CHEST  2 VIEW COMPARISON:  01/04/2016 FINDINGS: Prior median sternotomy. Apical lordotic frontal radiographs. Midline trachea. Moderate cardiomegaly. Atherosclerosis in the transverse aorta. No pleural effusion or pneumothorax. Hyperinflation. No congestive failure. No lobar consolidation. IMPRESSION: Cardiomegaly and hyperinflation, without acute disease. Electronically Signed   By: Abigail Miyamoto M.D.   On: 07/14/2016 21:55   Recent Results (from the past 240 hour(s))  Culture, blood (Routine x 2)     Status: None (Preliminary result)   Collection Time: 07/14/16  9:00 PM  Result Value Ref Range Status   Specimen Description BLOOD LEFT ARM  Final   Special Requests   Final    BOTTLES DRAWN AEROBIC AND ANAEROBIC Blood Culture adequate volume   Culture  Setup Time   Final    GRAM POSITIVE COCCI IN CLUSTERS AEROBIC BOTTLE ONLY Organism ID to follow    Culture NO GROWTH < 24 HOURS  Final   Report Status PENDING  Incomplete  Blood Culture ID Panel (Reflexed)     Status: Abnormal   Collection Time: 07/14/16  9:00 PM  Result Value Ref Range Status   Enterococcus species NOT  DETECTED NOT DETECTED Final   Listeria monocytogenes NOT DETECTED NOT DETECTED Final   Staphylococcus  species DETECTED (A) NOT DETECTED Final    Comment: CRITICAL RESULT CALLED TO, READ BACK BY AND VERIFIED WITH: N. Batchelder Pharm.D. 14:15 07/15/16 ([email or web address])    Staphylococcus aureus DETECTED (A) NOT DETECTED Final    Comment: Methicillin (oxacillin) susceptible Staphylococcus aureus (MSSA). Preferred therapy is anti staphylococcal beta lactam antibiotic (Cefazolin or Nafcillin), unless clinically contraindicated. CRITICAL RESULT CALLED TO, READ BACK BY AND VERIFIED WITH: N. Batchelder Pharm.D. 14:15 07/15/16 ([email or web address])    Methicillin resistance NOT DETECTED NOT DETECTED Final   Streptococcus species NOT DETECTED NOT DETECTED Final   Streptococcus agalactiae NOT DETECTED NOT DETECTED Final   Streptococcus pneumoniae NOT DETECTED NOT DETECTED Final   Streptococcus pyogenes NOT DETECTED NOT DETECTED Final   Acinetobacter baumannii NOT DETECTED NOT DETECTED Final   Enterobacteriaceae species NOT DETECTED NOT DETECTED Final   Enterobacter cloacae complex NOT DETECTED NOT DETECTED Final   Escherichia coli NOT DETECTED NOT DETECTED Final   Klebsiella oxytoca NOT DETECTED NOT DETECTED Final   Klebsiella pneumoniae NOT DETECTED NOT DETECTED Final   Proteus species NOT DETECTED NOT DETECTED Final   Serratia marcescens NOT DETECTED NOT DETECTED Final   Haemophilus influenzae NOT DETECTED NOT DETECTED Final   Neisseria meningitidis NOT DETECTED NOT DETECTED Final   Pseudomonas aeruginosa NOT DETECTED NOT DETECTED Final   Candida albicans NOT DETECTED NOT DETECTED Final   Candida glabrata NOT DETECTED NOT DETECTED Final   Candida krusei NOT DETECTED NOT DETECTED Final   Candida parapsilosis NOT DETECTED NOT DETECTED Final   Candida tropicalis NOT DETECTED NOT DETECTED Final  Culture, blood (Routine x 2)     Status: None (Preliminary result)   Collection Time: 07/14/16  9:13 PM  Result Value Ref  Range Status   Specimen Description BLOOD RIGHT ARM  Final   Special Requests   Final    BOTTLES DRAWN AEROBIC AND ANAEROBIC Blood Culture adequate volume   Culture NO GROWTH < 24 HOURS  Final   Report Status PENDING  Incomplete      07/15/2016, 4:56 PM     LOS: 1 day    Records and images were personally reviewed where available.  Attending- Note reviewed, confirmed. His PEN allergy history was over 30 years go and not completely clear. I am not sure if this is due to his dementia or due to the length of time since his allergy.  Discussed with pharmacy and he has not gotten cephalosporin at [facility].  Will repeat BCx. Will continue vanco alone.  Would get TEE to help determine length of therapy.

## 2016-07-15 NOTE — Progress Notes (Signed)
  PHARMACY - PHYSICIAN COMMUNICATION CRITICAL VALUE ALERT - BLOOD CULTURE IDENTIFICATION (BCID)  Results for orders placed or performed during the hospital encounter of 07/14/16  Blood Culture ID Panel (Reflexed) (Collected: 07/14/2016  9:00 PM)  Result Value Ref Range   Enterococcus species NOT DETECTED NOT DETECTED   Listeria monocytogenes NOT DETECTED NOT DETECTED   Staphylococcus species DETECTED (A) NOT DETECTED   Staphylococcus aureus DETECTED (A) NOT DETECTED   Methicillin resistance NOT DETECTED NOT DETECTED   Streptococcus species NOT DETECTED NOT DETECTED   Streptococcus agalactiae NOT DETECTED NOT DETECTED   Streptococcus pneumoniae NOT DETECTED NOT DETECTED   Streptococcus pyogenes NOT DETECTED NOT DETECTED   Acinetobacter baumannii NOT DETECTED NOT DETECTED   Enterobacteriaceae species NOT DETECTED NOT DETECTED   Enterobacter cloacae complex NOT DETECTED NOT DETECTED   Escherichia coli NOT DETECTED NOT DETECTED   Klebsiella oxytoca NOT DETECTED NOT DETECTED   Klebsiella pneumoniae NOT DETECTED NOT DETECTED   Proteus species NOT DETECTED NOT DETECTED   Serratia marcescens NOT DETECTED NOT DETECTED   Haemophilus influenzae NOT DETECTED NOT DETECTED   Neisseria meningitidis NOT DETECTED NOT DETECTED   Pseudomonas aeruginosa NOT DETECTED NOT DETECTED   Candida albicans NOT DETECTED NOT DETECTED   Candida glabrata NOT DETECTED NOT DETECTED   Candida krusei NOT DETECTED NOT DETECTED   Candida parapsilosis NOT DETECTED NOT DETECTED   Candida tropicalis NOT DETECTED NOT DETECTED    Name of physician (or Provider) Contacted: M. Short  Blood cx shows 1/4 GPC in clusters. BCID reports MSSA. Currently on vanc and aztreonam for sepsis with possible PNA. ID will get automatic consult, will need to rule out endocarditis. Does have PCN allergy and no history of cephalosporin use. If less worrisome for possible PNA, could trial cefazolin while here to see if he tolerates.  Changes to  prescribed antibiotics required: No change for now, follow up ID recs. Would consider trial of cefazolin if possible.  Elenor Quinones, PharmD, BCPS Clinical Pharmacist Pager [phone] 07/15/2016 2:21 PM

## 2016-07-15 NOTE — Progress Notes (Signed)
Pt complains of headache since admission. Tylenol and percocet given mult times with minimal relief. Will notify md.

## 2016-07-15 NOTE — Progress Notes (Signed)
PROGRESS NOTE  Barry Mcdaniel Sr.  KGM:[id number] DOB: December 05, 1932 DOA: 07/14/2016 PCP: [name], MD  Brief Narrative:   The patient is an 83 year old male with history of dementia, coronary artery disease status post CABG in 2017, hypertension, who lives at home with his wife. He was brought to the emergency department because of progressive weakness, fevers to 102.2 Fahrenheit at home and mild increase in cough.  He also had some severe substernal chest pains for which his wife gave him nitroglycerin at home. He did not have improvement in his chest pain within 1-2 minutes with a nitroglycerin. Instead, after approximately 15 minutes he had a gradual improvement in his symptoms. This morning, he states that his pain has only come once as a "flash", and gone completely away.  Urinalysis was negative. Chest x-ray was reviewed by radiology and read as negative however I feel that he may have a subtle left lower lobe infiltrate. He also has some rales in the same distribution suggestive of pneumonia.  We will check an additional troponin to evaluate for ischemia and an echocardiogram due to the combination of high fevers, chest pains, and no other clear etiology. Cardiogram is normal, I will presume that he has pneumonia and treated him for community acquired pneumonia.  Assessment & Plan:   Principal Problem:   Fever Active Problems:   Type II diabetes mellitus, uncontrolled (HCC)   Mitral regurgitation and aortic stenosis   CKD (chronic kidney disease), stage III   S/P CABG (coronary artery bypass graft)   Persistent atrial fibrillation (HCC)   Dementia  SIRS due to probable pneumonia (fever, leukocytosis, tachypnea and HR in 90s on admission).  Ddx also includes pericarditis.  Leukocytosis persistent and having intermittent spiking fevers -  Flu negative -  Blood cultures pending -  UA unremarkable -  CXR with possible infiltrate in LLL based on my personal review of images -  Continue  vancomycin and aztreonam (PCN allergy) -  D/c levofloxacin -  MRSA PCR -  procalcitonin -  ECHO  Chest pains, atypical and may be related to pneumonia.  Pains are not consistent and not worsened by breathing or movement.  Has history of CAD. -  Tele -  ECG demonstrates a-fib with RBBB, stable from prior -  Troponin:  0.05 -  ECHO  Type II diabetes mellitus, controlled ([place]), A1c 6.8 on 03/22/2016 -  Continue SSI  CAD S/P CABG (coronary artery bypass graft) last year -  Continue aspirin, statin.  Patient not on beta blocker due to heart block per cards  Persistent atrial fibrillation (HCC) - Chronic Paroxysmal atrial fibrillation, CHADS2vasc 6 -  Rate controlled -  Continue eliquis  Chronic systolic heart failure with EF 45-50%, Mitral regurgitation and aortic stenosis  CKD (chronic kidney disease), stage III- creatinine at baseline  Dementia- stable  Hypothyroidism, stable  COPD, stable, continue symbicort  DVT prophylaxis:  eliquis Code Status:  Full code Family Communication:  Patient and his wife who was at bedside Disposition Plan:  Awaiting blood cultures, ECHO.  PT eval.  Likely home with wife in 1-2 days   Consultants:   none  Procedures:  none  Antimicrobials:  Anti-infectives    Start     Dose/Rate Route Frequency Ordered Stop   07/15/16 2200  levofloxacin (LEVAQUIN) IVPB 750 mg  Status:  Discontinued     750 mg 100 mL/hr over 90 Minutes Intravenous Daily at bedtime 07/15/16 0434 07/15/16 0929   07/15/16 0900  vancomycin (  VANCOCIN) IVPB 750 mg/150 ml premix     750 mg 150 mL/hr over 60 Minutes Intravenous Every 12 hours 07/15/16 0434     07/15/16 0800  aztreonam (AZACTAM) 2 g in dextrose 5 % 50 mL IVPB     2 g 100 mL/hr over 30 Minutes Intravenous Every 8 hours 07/15/16 0434     07/14/16 2115  levofloxacin (LEVAQUIN) IVPB 750 mg     750 mg 100 mL/hr over 90 Minutes Intravenous  Once 07/14/16 2103 07/14/16 2251   07/14/16 2115  aztreonam (AZACTAM) 2  g in dextrose 5 % 50 mL IVPB     2 g 100 mL/hr over 30 Minutes Intravenous  Once 07/14/16 2103 07/14/16 2320   07/14/16 2115  vancomycin (VANCOCIN) IVPB 1000 mg/200 mL premix     1,000 mg 200 mL/hr over 60 Minutes Intravenous  Once 07/14/16 2103 07/14/16 2245       Subjective: Had episode of chest pain again this morning that occurred like a quick flash and then resolved.  Chest pains yesterday did not get better with nitroglycerin.  Denies changes in chest pain with breathing or with position.  Cough may have been worse the last couple of days.  Thinks that his fever may be coming back.  Denies vomiting or diarrhea  Objective: Vitals:   07/15/16 0136 07/15/16 0146 07/15/16 0603 07/15/16 0935  BP: 111/74  (!) 147/66 (!) 129/54  Pulse: 83  78 77  Resp: 18 16 17 18   Temp: 98.1 F (36.7 C)  98.7 F (37.1 C) 98 F (36.7 C)  TempSrc: Oral  Oral Oral  SpO2: 99%  98% 98%  Weight: 85.8 kg (189 lb 3.2 oz)     Height:        Intake/Output Summary (Last 24 hours) at 07/15/16 1159 Last data filed at 07/15/16 0900  Gross per 24 hour  Intake             3640 ml  Output             1376 ml  Net             2264 ml   Filed Weights   07/14/16 2124 07/15/16 0136  Weight: 87.1 kg (192 lb) 85.8 kg (189 lb 3.2 oz)    Examination:  General exam:  Adult male.  No acute distress.  HEENT:  NCAT, MMM Respiratory system:  Rales the left lower lateral base, no wheezes or rhonchi, no increased WOB Cardiovascular system:   IRRR, 2/6 systolic murmur at the left sternal border, no rubs or gallops.  Warm extremities Gastrointestinal system: Normal active bowel sounds, soft, nondistended, nontender. MSK:  Normal tone and bulk, no lower extremity edema Neuro:  Grossly intact    Data Reviewed: I have personally reviewed following labs and imaging studies  CBC:  Recent Labs Lab 07/14/16 2100 07/15/16 0121 07/15/16 0409  WBC 16.9* 20.1* 20.8*  NEUTROABS 15.2* 17.9*  --   HGB 11.3* 10.6*  10.6*  HCT 34.6* 33.9* 34.1*  MCV 88.3 88.5 88.3  PLT 124* 129* 914*   Basic Metabolic Panel:  Recent Labs Lab 07/14/16 2100 07/15/16 0121 07/15/16 0409  NA 137 140 140  K 4.1 4.2 4.4  CL 110 113* 114*  CO2 19* 20* 19*  GLUCOSE 167* 164* 149*  BUN 19 17 17   CREATININE 1.12 1.10 1.09  CALCIUM 8.7* 8.0* 8.0*   GFR: Estimated Creatinine Clearance: 56.6 mL/min (by C-G formula based on SCr of 1.09  mg/dL). Liver Function Tests:  Recent Labs Lab 07/14/16 2100 07/15/16 0121  AST 17 20  ALT 10* 15*  ALKPHOS 64 58  BILITOT 0.7 0.5  PROT 6.0* 5.5*  ALBUMIN 3.1* 2.9*   No results for input(s): LIPASE, AMYLASE in the last 168 hours. No results for input(s): AMMONIA in the last 168 hours. Coagulation Profile:  Recent Labs Lab 07/14/16 2100  INR 1.12   Cardiac Enzymes:  Recent Labs Lab 07/15/16 1020  TROPONINI 0.05*   BNP (last 3 results) No results for input(s): PROBNP in the last 8760 hours. HbA1C: No results for input(s): HGBA1C in the last 72 hours. CBG:  Recent Labs Lab 07/15/16 0130 07/15/16 0744 07/15/16 1132  GLUCAP 156* 133* 154*   Lipid Profile: No results for input(s): CHOL, HDL, LDLCALC, TRIG, CHOLHDL, LDLDIRECT in the last 72 hours. Thyroid Function Tests: No results for input(s): TSH, T4TOTAL, FREET4, T3FREE, THYROIDAB in the last 72 hours. Anemia Panel: No results for input(s): VITAMINB12, FOLATE, FERRITIN, TIBC, IRON, RETICCTPCT in the last 72 hours. Urine analysis:    Component Value Date/Time   COLORURINE COLORLESS (A) 07/14/2016 2220   APPEARANCEUR CLEAR 07/14/2016 2220   LABSPEC 1.004 (L) 07/14/2016 2220   PHURINE 6.0 07/14/2016 2220   GLUCOSEU NEGATIVE 07/14/2016 2220   GLUCOSEU NEGATIVE 12/15/2015 1028   HGBUR NEGATIVE 07/14/2016 2220   BILIRUBINUR NEGATIVE 07/14/2016 2220   KETONESUR NEGATIVE 07/14/2016 2220   PROTEINUR NEGATIVE 07/14/2016 2220   UROBILINOGEN 1.0 12/15/2015 1028   NITRITE NEGATIVE 07/14/2016 2220    LEUKOCYTESUR NEGATIVE 07/14/2016 2220   Sepsis Labs: @LABRCNTIP (procalcitonin:4,lacticidven:4)  ) Recent Results (from the past 240 hour(s))  Culture, blood (Routine x 2)     Status: None (Preliminary result)   Collection Time: 07/14/16  9:00 PM  Result Value Ref Range Status   Specimen Description BLOOD LEFT ARM  Final   Special Requests   Final    BOTTLES DRAWN AEROBIC AND ANAEROBIC Blood Culture adequate volume   Culture PENDING  Incomplete   Report Status PENDING  Incomplete  Culture, blood (Routine x 2)     Status: None (Preliminary result)   Collection Time: 07/14/16  9:13 PM  Result Value Ref Range Status   Specimen Description BLOOD RIGHT ARM  Final   Special Requests   Final    BOTTLES DRAWN AEROBIC AND ANAEROBIC Blood Culture adequate volume   Culture PENDING  Incomplete   Report Status PENDING  Incomplete      Radiology Studies: Dg Chest 2 View  Result Date: 07/14/2016 CLINICAL DATA:  Cough and mental status changes.  Fever. EXAM: CHEST  2 VIEW COMPARISON:  01/04/2016 FINDINGS: Prior median sternotomy. Apical lordotic frontal radiographs. Midline trachea. Moderate cardiomegaly. Atherosclerosis in the transverse aorta. No pleural effusion or pneumothorax. Hyperinflation. No congestive failure. No lobar consolidation. IMPRESSION: Cardiomegaly and hyperinflation, without acute disease. Electronically Signed   By: Abigail Miyamoto M.D.   On: 07/14/2016 21:55     Scheduled Meds: . apixaban  5 mg Oral BID  . aspirin EC  81 mg Oral Daily  . ferrous sulfate  325 mg Oral Q supper  . finasteride  5 mg Oral Daily  . guaiFENesin  600 mg Oral BID  . insulin aspart  0-9 Units Subcutaneous TID WC  . levothyroxine  112 mcg Oral QAC breakfast  . lisinopril  20 mg Oral Daily  . rosuvastatin  10 mg Oral QHS  . sodium chloride flush  3 mL Intravenous Q12H  . triamcinolone  2 spray Nasal Daily  . vitamin B-12  1,000 mcg Oral Daily   Continuous Infusions: . sodium chloride    .  aztreonam Stopped (07/15/16 1054)  . vancomycin 750 mg (07/15/16 1115)     LOS: 1 day    Time spent: 30 min    Janece Canterbury, MD [facility] Pager [phone]  If 7PM-7AM, please contact night-coverage [email or web address] Password [email or web address] 07/15/2016, 11:59 AM

## 2016-07-15 NOTE — ED Notes (Signed)
Patient is stable and ready to be transport to the floor at this time.  Report was called to 6E RN.  Belongings taken with the patient to the floor.   

## 2016-07-15 NOTE — Progress Notes (Signed)
Patient respiratory assessment done and patient scored a 3. Patient hs PRN albuterol inhaler at home with Symbicort BID. Patient doing well at the moment breathing at 16 times a minute, with no distress noted and good BBS throughout with some diminished in the bases. X ray clear for any active disease and looks good. Congested cough current;y with no O2 needed.

## 2016-07-15 NOTE — Progress Notes (Signed)
Pharmacy Antibiotic Note  Barry SALMONS Sr. is a 83 y.o. male admitted on 07/14/2016 with fever/sepsis.  Pharmacy has been consulted for Aztreonam, Levaquin, and Vancomycin dosing.  Levaquin 750mg  IV, Aztreonam 2gm, Vanc 1gm IV in ED ~2200  Plan: Levaquin 750mg  IV q24h Aztreonam 2gm IV q8h Vancomycin 750mg  IV q12h Will f/u micro data, renal function, and pt's clinical condition Vanc trough prn   Height: 5\' 11"  (180.3 cm) Weight: 189 lb 3.2 oz (85.8 kg) IBW/kg (Calculated) : 75.3  Temp (24hrs), Avg:99.9 F (37.7 C), Min:98.1 F (36.7 C), Max:102.6 F (39.2 C)   Recent Labs Lab 07/14/16 2100 07/14/16 2112 07/15/16 0023 07/15/16 0121  WBC 16.9*  --   --  20.1*  CREATININE 1.12  --   --  1.10  LATICACIDVEN  --  2.34* 2.05* 1.8    Estimated Creatinine Clearance: 56.1 mL/min (by C-G formula based on SCr of 1.1 mg/dL).    Allergies  Allergen Reactions  . Penicillins Swelling    Eyes and lips Has patient had a PCN reaction causing immediate rash, facial/tongue/throat swelling, SOB or lightheadedness with hypotension: Yes Has patient had a PCN reaction causing severe rash involving mucus membranes or skin necrosis: No Has patient had a PCN reaction that required hospitalization No Has patient had a PCN reaction occurring within the last 10 years: No If all of the above answers are "NO", then may proceed with Cephalosporin use   . Prednisone Other (See Comments)    Hyperglycemia and AMS    Antimicrobials this admission: 5/6 Levaquin >>  5/6 Aztreonam >>  5/6 Vanc >>  Dose adjustments this admission: n/a  Microbiology results: 5/6 BCx x2:   Thank you for allowing pharmacy to be a part of this patient's care.  Sherlon Handing, PharmD, BCPS Clinical pharmacist, pager [phone] 07/15/2016 4:25 AM

## 2016-07-15 NOTE — Progress Notes (Signed)
CRITICAL VALUE ALERT  Critical value received:  Troponin   Date of notification:  [id number]  Time of notification:  1132  Critical value read back:  Nurse who received alert:  Koren Shiver   MD notified (1st page):  yes  Time of first page:  1135  MD notified (2nd page):  Time of second page:  Responding MD:  Dr. Sheran Fava  Time MD responded:  [phone]

## 2016-07-16 ENCOUNTER — Ambulatory Visit: Payer: Medicare Other | Admitting: Endocrinology

## 2016-07-16 ENCOUNTER — Inpatient Hospital Stay (HOSPITAL_COMMUNITY): Payer: Medicare Other

## 2016-07-16 DIAGNOSIS — R072 Precordial pain: Secondary | ICD-10-CM

## 2016-07-16 DIAGNOSIS — R5081 Fever presenting with conditions classified elsewhere: Secondary | ICD-10-CM

## 2016-07-16 DIAGNOSIS — R7881 Bacteremia: Secondary | ICD-10-CM

## 2016-07-16 LAB — CBC
HCT: 34.4 % — ABNORMAL LOW (ref 39.0–52.0)
Hemoglobin: 10.7 g/dL — ABNORMAL LOW (ref 13.0–17.0)
MCH: 27.5 pg (ref 26.0–34.0)
MCHC: 31.1 g/dL (ref 30.0–36.0)
MCV: 88.4 fL (ref 78.0–100.0)
PLATELETS: 127 10*3/uL — AB (ref 150–400)
RBC: 3.89 MIL/uL — AB (ref 4.22–5.81)
RDW: 16.2 % — ABNORMAL HIGH (ref 11.5–15.5)
WBC: 13.8 10*3/uL — AB (ref 4.0–10.5)

## 2016-07-16 LAB — BASIC METABOLIC PANEL
Anion gap: 9 (ref 5–15)
BUN: 13 mg/dL (ref 6–20)
CHLORIDE: 109 mmol/L (ref 101–111)
CO2: 21 mmol/L — ABNORMAL LOW (ref 22–32)
CREATININE: 1.09 mg/dL (ref 0.61–1.24)
Calcium: 8.6 mg/dL — ABNORMAL LOW (ref 8.9–10.3)
GFR calc non Af Amer: >60 mL/min (ref >60)
Glucose, Bld: 173 mg/dL — ABNORMAL HIGH (ref 65–99)
Potassium: 4 mmol/L (ref 3.5–5.1)
SODIUM: 139 mmol/L (ref 135–145)

## 2016-07-16 LAB — GLUCOSE, CAPILLARY
GLUCOSE-CAPILLARY: 141 mg/dL — AB (ref 65–99)
GLUCOSE-CAPILLARY: 187 mg/dL — AB (ref 65–99)
Glucose-Capillary: 212 mg/dL — ABNORMAL HIGH (ref 65–99)
Glucose-Capillary: 126 mg/dL — ABNORMAL HIGH (ref 65–99)

## 2016-07-16 LAB — ECHOCARDIOGRAM COMPLETE
Height: 71 in
WEIGHTICAEL: 2843.05 [oz_av]

## 2016-07-16 MED ORDER — LORAZEPAM 2 MG/ML IJ SOLN: 0.5000 mg | Freq: Once | INTRAMUSCULAR | Status: DC

## 2016-07-16 MED ORDER — HYDRALAZINE HCL 20 MG/ML IJ SOLN: 10.0000 mg | INTRAMUSCULAR | Status: DC | PRN

## 2016-07-16 MED ORDER — LORAZEPAM 2 MG/ML IJ SOLN
1.0000 mg | Freq: Once | INTRAMUSCULAR | Status: AC | PRN
  Administered 2016-07-16: 1 mg via INTRAVENOUS
  Filled 2016-07-16: qty 1

## 2016-07-16 NOTE — Plan of Care (Signed)
Problem: Pain Managment: Goal: General experience of comfort will improve Outcome: Progressing Patients pain will be managed using PRN pain medications to achieve a pain level that is acceptable for patient

## 2016-07-16 NOTE — Evaluation (Signed)
Physical Therapy Evaluation Patient Details Name: Barry Mcdaniel. MRN: [id number] DOB: 1933-03-05 Today's Date: 07/16/2016   History of Present Illness  HPI: Barry Mcdaniel. is a 83 y.o. male with medical history significant of dementia, CABG, HTN brought in by wife for fever. MSSA bacteremia  Clinical Impression   Pt admitted with above diagnosis. Pt currently with functional limitations due to the deficits listed below (see PT Problem List). Overall moving well, walking household distance with minor gait deviations without assistive device; will plan on stairs next session with or without a cane;  Pt will benefit from skilled PT to increase their independence and safety with mobility to allow discharge to the venue listed below.       Follow Up Recommendations Outpatient PT;Other (comment) (for further balance interventions; pt and wife will consider)    Equipment Recommendations  None recommended by PT    Recommendations for Other Services       Precautions / Restrictions        Mobility  Bed Mobility Overal bed mobility: Needs Assistance Bed Mobility: Supine to Sit     Supine to sit: Supervision     General bed mobility comments: Cues to initiate  Transfers Overall transfer level: Needs assistance Equipment used: None Transfers: Sit to/from Stand Sit to Stand: Min guard (without physcial contact)         General transfer comment: Slow movign, but able to stand without physical assist  Ambulation/Gait Ambulation/Gait assistance: Min guard Ambulation Distance (Feet): 200 Feet (at least) Assistive device: None Gait Pattern/deviations: Step-through pattern;Wide base of support   Gait velocity interpretation: Below normal speed for age/gender General Gait Details: Cues to self-monitor for activity tolerance; slow moving and wide BOS, but no overt loss of balance  Stairs            Wheelchair Mobility    Modified Rankin (Stroke Patients Only)        Balance Overall balance assessment: Needs assistance Sitting-balance support: Feet supported Sitting balance-Leahy Scale: Good       Standing balance-Leahy Scale: Good                               Pertinent Vitals/Pain Pain Assessment: Faces Faces Pain Scale: Hurts little more Pain Location: Headache Pain Descriptors / Indicators: Headache Pain Intervention(s): Monitored during session    Home Living Family/patient expects to be discharged to:: Private residence Living Arrangements: Spouse/significant other Available Help at Discharge: Family;Available 24 hours/day Type of Home: House Home Access: Stairs to enter   [employer] of Steps: 6 Home Layout: One level Home Equipment: Walker - 2 wheels;Cane - single point;Shower seat - built in;Grab bars - tub/shower;Adaptive equipment      Prior Function Level of Independence: Independent         Comments: drives     Hand Dominance   Dominant Hand: Right    Extremity/Trunk Assessment   Upper Extremity Assessment Upper Extremity Assessment: Overall WFL for tasks assessed    Lower Extremity Assessment Lower Extremity Assessment: Generalized weakness       Communication   Communication: No difficulties  Cognition Arousal/Alertness: Awake/alert Behavior During Therapy: WFL for tasks assessed/performed Overall Cognitive Status: History of cognitive impairments - at baseline  General Comments      Exercises     Assessment/Plan    PT Assessment Patient needs continued PT services  PT Problem List Decreased strength;Decreased activity tolerance;Decreased balance;Decreased mobility       PT Treatment Interventions DME instruction;Gait training;Stair training;Functional mobility training;Therapeutic activities;Therapeutic exercise;Balance training    PT Goals (Current goals can be found in the Care Plan section)  Acute Rehab  PT Goals Patient Stated Goal: Home soon PT Goal Formulation: With patient Time For Goal Achievement: 07/23/16 Potential to Achieve Goals: Good    Frequency Min 3X/week   Barriers to discharge        Co-evaluation               AM-PAC PT "6 Clicks" Daily Activity  Outcome Measure Difficulty turning over in bed (including adjusting bedclothes, sheets and blankets)?: A Little Difficulty moving from lying on back to sitting on the side of the bed? : A Little Difficulty sitting down on and standing up from a chair with arms (e.g., wheelchair, bedside commode, etc,.)?: A Little Help needed moving to and from a bed to chair (including a wheelchair)?: A Little Help needed walking in hospital room?: A Little Help needed climbing 3-5 steps with a railing? : A Little 6 Click Score: 18    End of Session Equipment Utilized During Treatment: Gait belt Activity Tolerance: Patient tolerated treatment well Patient left: in chair;with call bell/phone within reach;with chair alarm set Nurse Communication: Mobility status PT Visit Diagnosis: Muscle weakness (generalized) (M62.81)    Time: 1106-1130 PT Time Calculation (min) (ACUTE ONLY): 24 min   Charges:   PT Evaluation $PT Eval Low Complexity: 1 Procedure PT Treatments $Gait Training: 8-22 mins   PT G Codes:        Roney Marion, PT  Acute Rehabilitation Services Pager [phone] Office [phone]'  [place] 07/16/2016, 3:42 PM

## 2016-07-16 NOTE — Consult Note (Signed)
           [facility] CM Primary Care Navigator  07/16/2016  Barry Weekes Mcilvaine Sr. [birth year] [id number]   Martin Majestic to see patientat the bedside to identify possible discharge needsbut RNreports that patient is off the unit for a procedure (2D Echo) at the moment.   Will attempt to meet with patient at another time when he is available in the room.    For questions, please contact:  Dannielle Huh, BSN, RN- [facility] Primary Care Navigator  Telephone: [phone] [place]

## 2016-07-16 NOTE — Progress Notes (Signed)
PROGRESS NOTE  Barry ANTONELLI Sr.  QVZ:[id number] DOB: 10-23-1932 DOA: 07/14/2016 PCP: [name], MD  Brief Narrative:   The patient is an 83 year old male with history of dementia, coronary artery disease status post CABG in 2017, hypertension, who lives at home with his wife. He was brought to the emergency department because of progressive weakness, fevers to 102.2 Fahrenheit at home and mild increase in cough.  Blood cultures were positive for MSSA.  ID has been consulted.  MRI brain is pending due to persistent headache, but ID is currently not recommending LP.  If ECHO is negative, he will need TEE.  Currently on monotherapy vancomycin.  Source is unclear but he has had a scalp and right flank abrasion and a toe nail which has fallen off in the last couple of weeks, none of which appear infected.    Assessment & Plan:   Principal Problem:   Fever Active Problems:   Type II diabetes mellitus, uncontrolled (HCC)   Mitral regurgitation and aortic stenosis   CKD (chronic kidney disease), stage III   S/P CABG (coronary artery bypass graft)   Persistent atrial fibrillation (HCC)   Dementia   Bacteremia  Sepsis (fever, leukocytosis, tachypnea and HR in 90s on admission) due to MSSA bacteremia.  Sepsis physiology improving -  Flu negative -  Blood culture 1/2 on 5/6 positive for MSSA -  Blood culture 5/7 NGTD -  Continue vancomycin -  ECHO -  MRI brain w/wo pending  Chest pains, atypical and may be related to pneumonia.  Pains are not consistent and not worsened by breathing or movement.  Has history of CAD. -  ECG demonstrates a-fib with RBBB, stable from prior -  Troponin:  0.05 -  ECHO  Type II diabetes mellitus, controlled ([place]), A1c 6.8 on 03/22/2016 -  Continue SSI  CAD S/P CABG (coronary artery bypass graft) last year -  Continue aspirin, statin.  Patient not on beta blocker due to heart block per cards  Chronic atrial fibrillation, CHADS2vasc 6 -  Rate controlled -   Continue eliquis unless evidence of endocarditis  Chronic systolic heart failure with EF 45-50%, Mitral regurgitation and aortic stenosis  CKD (chronic kidney disease), stage III- creatinine at baseline  Dementia- stable  Hypothyroidism, stable  COPD, stable, continue symbicort  DVT prophylaxis:  eliquis Code Status:  Full code Family Communication:  Patient and his wife who was at bedside Disposition Plan:  Awaiting blood cultures, ECHO, MRI brain.  May need TEE.  Will likely need PICC line once cultures clear for long course of IV antibiotics.  PT recommending outpatient therapy.  Will need HH services at discharge.    Consultants:   ID  Procedures:  none  Antimicrobials:  Anti-infectives    Start     Dose/Rate Route Frequency Ordered Stop   07/15/16 2200  levofloxacin (LEVAQUIN) IVPB 750 mg  Status:  Discontinued     750 mg 100 mL/hr over 90 Minutes Intravenous Daily at bedtime 07/15/16 0434 07/15/16 0929   07/15/16 0900  vancomycin (VANCOCIN) IVPB 750 mg/150 ml premix     750 mg 150 mL/hr over 60 Minutes Intravenous Every 12 hours 07/15/16 0434     07/15/16 0800  aztreonam (AZACTAM) 2 g in dextrose 5 % 50 mL IVPB  Status:  Discontinued     2 g 100 mL/hr over 30 Minutes Intravenous Every 8 hours 07/15/16 0434 07/15/16 1653   07/14/16 2115  levofloxacin (LEVAQUIN) IVPB 750 mg  750 mg 100 mL/hr over 90 Minutes Intravenous  Once 07/14/16 2103 07/14/16 2251   07/14/16 2115  aztreonam (AZACTAM) 2 g in dextrose 5 % 50 mL IVPB     2 g 100 mL/hr over 30 Minutes Intravenous  Once 07/14/16 2103 07/14/16 2320   07/14/16 2115  vancomycin (VANCOCIN) IVPB 1000 mg/200 mL premix     1,000 mg 200 mL/hr over 60 Minutes Intravenous  Once 07/14/16 2103 07/14/16 2245       Subjective: Having constant headache since admission.  Denies further chest pains.  Ongoing chronic cough, but not worse than baseline.  Denies blurred vision, focal numbness or weakness.    Objective: Vitals:    07/15/16 2041 07/16/16 0600 07/16/16 0631 07/16/16 1038  BP: (!) 144/98 (!) 173/88 (!) 159/80 139/63  Pulse: 79 78  (!) 55  Resp: 18 18  17   Temp: 98.7 F (37.1 C) 98.3 F (36.8 C)  98.1 F (36.7 C)  TempSrc: Oral Oral  Oral  SpO2: 100% 97%  96%  Weight: 80.6 kg (177 lb 11.1 oz)     Height:        Intake/Output Summary (Last 24 hours) at 07/16/16 1614 Last data filed at 07/16/16 1408  Gross per 24 hour  Intake              930 ml  Output              475 ml  Net              455 ml   Filed Weights   07/14/16 2124 07/15/16 0136 07/15/16 2041  Weight: 87.1 kg (192 lb) 85.8 kg (189 lb 3.2 oz) 80.6 kg (177 lb 11.1 oz)    Examination:  General exam:  Adult male.  No acute distress.  HEENT:  NCAT, MMM Respiratory system:  CTAB Cardiovascular system:   IRRR, 2/6 systolic murmur at the left sternal border, no rubs or gallops.  Warm extremities Gastrointestinal system: Normal active bowel sounds, soft, nondistended, nontender. MSK:  Normal tone and bulk, no lower extremity edema Neuro:  Grossly intact    Data Reviewed: I have personally reviewed following labs and imaging studies  CBC:  Recent Labs Lab 07/14/16 2100 07/15/16 0121 07/15/16 0409 07/16/16 0556  WBC 16.9* 20.1* 20.8* 13.8*  NEUTROABS 15.2* 17.9*  --   --   HGB 11.3* 10.6* 10.6* 10.7*  HCT 34.6* 33.9* 34.1* 34.4*  MCV 88.3 88.5 88.3 88.4  PLT 124* 129* 127* 253*   Basic Metabolic Panel:  Recent Labs Lab 07/14/16 2100 07/15/16 0121 07/15/16 0409 07/16/16 0556  NA 137 140 140 139  K 4.1 4.2 4.4 4.0  CL 110 113* 114* 109  CO2 19* 20* 19* 21*  GLUCOSE 167* 164* 149* 173*  BUN 19 17 17 13   CREATININE 1.12 1.10 1.09 1.09  CALCIUM 8.7* 8.0* 8.0* 8.6*   GFR: Estimated Creatinine Clearance: 56.6 mL/min (by C-G formula based on SCr of 1.09 mg/dL). Liver Function Tests:  Recent Labs Lab 07/14/16 2100 07/15/16 0121  AST 17 20  ALT 10* 15*  ALKPHOS 64 58  BILITOT 0.7 0.5  PROT 6.0* 5.5*    ALBUMIN 3.1* 2.9*   No results for input(s): LIPASE, AMYLASE in the last 168 hours. No results for input(s): AMMONIA in the last 168 hours. Coagulation Profile:  Recent Labs Lab 07/14/16 2100  INR 1.12   Cardiac Enzymes:  Recent Labs Lab 07/15/16 1020  TROPONINI 0.05*   BNP (  last 3 results) No results for input(s): PROBNP in the last 8760 hours. HbA1C: No results for input(s): HGBA1C in the last 72 hours. CBG:  Recent Labs Lab 07/15/16 1132 07/15/16 1659 07/15/16 2040 07/16/16 0820 07/16/16 1154  GLUCAP 154* 145* 140* 212* 126*   Lipid Profile: No results for input(s): CHOL, HDL, LDLCALC, TRIG, CHOLHDL, LDLDIRECT in the last 72 hours. Thyroid Function Tests: No results for input(s): TSH, T4TOTAL, FREET4, T3FREE, THYROIDAB in the last 72 hours. Anemia Panel: No results for input(s): VITAMINB12, FOLATE, FERRITIN, TIBC, IRON, RETICCTPCT in the last 72 hours. Urine analysis:    Component Value Date/Time   COLORURINE COLORLESS (A) 07/14/2016 2220   APPEARANCEUR CLEAR 07/14/2016 2220   LABSPEC 1.004 (L) 07/14/2016 2220   PHURINE 6.0 07/14/2016 2220   GLUCOSEU NEGATIVE 07/14/2016 2220   GLUCOSEU NEGATIVE 12/15/2015 1028   HGBUR NEGATIVE 07/14/2016 2220   BILIRUBINUR NEGATIVE 07/14/2016 2220   KETONESUR NEGATIVE 07/14/2016 2220   PROTEINUR NEGATIVE 07/14/2016 2220   UROBILINOGEN 1.0 12/15/2015 1028   NITRITE NEGATIVE 07/14/2016 2220   LEUKOCYTESUR NEGATIVE 07/14/2016 2220   Sepsis Labs: @LABRCNTIP (procalcitonin:4,lacticidven:4)  ) Recent Results (from the past 240 hour(s))  Culture, blood (Routine x 2)     Status: Abnormal (Preliminary result)   Collection Time: 07/14/16  9:00 PM  Result Value Ref Range Status   Specimen Description BLOOD LEFT ARM  Final   Special Requests   Final    BOTTLES DRAWN AEROBIC AND ANAEROBIC Blood Culture adequate volume   Culture  Setup Time   Final    GRAM POSITIVE COCCI IN CLUSTERS AEROBIC BOTTLE ONLY CRITICAL RESULT  CALLED TO, READ BACK BY AND VERIFIED WITH: N. Batchelder Pharm.D. 14:15 07/15/16 ([email or web address])    Culture (A)  Final    STAPHYLOCOCCUS AUREUS SUSCEPTIBILITIES TO FOLLOW    Report Status PENDING  Incomplete  Blood Culture ID Panel (Reflexed)     Status: Abnormal   Collection Time: 07/14/16  9:00 PM  Result Value Ref Range Status   Enterococcus species NOT DETECTED NOT DETECTED Final   Listeria monocytogenes NOT DETECTED NOT DETECTED Final   Staphylococcus species DETECTED (A) NOT DETECTED Final    Comment: CRITICAL RESULT CALLED TO, READ BACK BY AND VERIFIED WITH: N. Batchelder Pharm.D. 14:15 07/15/16 ([email or web address])    Staphylococcus aureus DETECTED (A) NOT DETECTED Final    Comment: Methicillin (oxacillin) susceptible Staphylococcus aureus (MSSA). Preferred therapy is anti staphylococcal beta lactam antibiotic (Cefazolin or Nafcillin), unless clinically contraindicated. CRITICAL RESULT CALLED TO, READ BACK BY AND VERIFIED WITH: N. Batchelder Pharm.D. 14:15 07/15/16 ([email or web address])    Methicillin resistance NOT DETECTED NOT DETECTED Final   Streptococcus species NOT DETECTED NOT DETECTED Final   Streptococcus agalactiae NOT DETECTED NOT DETECTED Final   Streptococcus pneumoniae NOT DETECTED NOT DETECTED Final   Streptococcus pyogenes NOT DETECTED NOT DETECTED Final   Acinetobacter baumannii NOT DETECTED NOT DETECTED Final   Enterobacteriaceae species NOT DETECTED NOT DETECTED Final   Enterobacter cloacae complex NOT DETECTED NOT DETECTED Final   Escherichia coli NOT DETECTED NOT DETECTED Final   Klebsiella oxytoca NOT DETECTED NOT DETECTED Final   Klebsiella pneumoniae NOT DETECTED NOT DETECTED Final   Proteus species NOT DETECTED NOT DETECTED Final   Serratia marcescens NOT DETECTED NOT DETECTED Final   Haemophilus influenzae NOT DETECTED NOT DETECTED Final   Neisseria meningitidis NOT DETECTED NOT DETECTED Final   Pseudomonas aeruginosa NOT DETECTED NOT DETECTED Final   Candida albicans NOT DETECTED  NOT DETECTED Final   Candida glabrata  NOT DETECTED NOT DETECTED Final   Candida krusei NOT DETECTED NOT DETECTED Final   Candida parapsilosis NOT DETECTED NOT DETECTED Final   Candida tropicalis NOT DETECTED NOT DETECTED Final  Culture, blood (Routine x 2)     Status: None (Preliminary result)   Collection Time: 07/14/16  9:13 PM  Result Value Ref Range Status   Specimen Description BLOOD RIGHT ARM  Final   Special Requests   Final    BOTTLES DRAWN AEROBIC AND ANAEROBIC Blood Culture adequate volume   Culture NO GROWTH 2 DAYS  Final   Report Status PENDING  Incomplete  Culture, blood (single) w Reflex to ID Panel     Status: None (Preliminary result)   Collection Time: 07/15/16  2:54 PM  Result Value Ref Range Status   Specimen Description BLOOD BLOOD LEFT ARM  Final   Special Requests IN PEDIATRIC BOTTLE Blood Culture adequate volume  Final   Culture NO GROWTH < 24 HOURS  Final   Report Status PENDING  Incomplete      Radiology Studies: Dg Chest 2 View  Result Date: 07/14/2016 CLINICAL DATA:  Cough and mental status changes.  Fever. EXAM: CHEST  2 VIEW COMPARISON:  01/04/2016 FINDINGS: Prior median sternotomy. Apical lordotic frontal radiographs. Midline trachea. Moderate cardiomegaly. Atherosclerosis in the transverse aorta. No pleural effusion or pneumothorax. Hyperinflation. No congestive failure. No lobar consolidation. IMPRESSION: Cardiomegaly and hyperinflation, without acute disease. Electronically Signed   By: Abigail Miyamoto M.D.   On: 07/14/2016 21:55     Scheduled Meds: . apixaban  5 mg Oral BID  . aspirin EC  81 mg Oral Daily  . ferrous sulfate  325 mg Oral Q supper  . finasteride  5 mg Oral Daily  . guaiFENesin  600 mg Oral BID  . insulin aspart  0-9 Units Subcutaneous TID WC  . levothyroxine  112 mcg Oral QAC breakfast  . lisinopril  20 mg Oral Daily  . rosuvastatin  10 mg Oral QHS  . sodium chloride flush  3 mL Intravenous Q12H  . triamcinolone  2 spray Nasal  Daily  . vitamin B-12  1,000 mcg Oral Daily   Continuous Infusions: . sodium chloride    . vancomycin Stopped (07/16/16 0940)     LOS: 2 days    Time spent: 30 min    Janece Canterbury, MD [facility] Pager [phone]  If 7PM-7AM, please contact night-coverage [email or web address] Password [email or web address] 07/16/2016, 4:14 PM

## 2016-07-16 NOTE — Progress Notes (Signed)
Echocardiogram 2D Echocardiogram has been performed.  Barry Mcdaniel 07/16/2016, 4:35 PM

## 2016-07-16 NOTE — Progress Notes (Signed)
INFECTIOUS DISEASE PROGRESS NOTE  ID: Barry Odor Merritts Sr. is a 83 y.o. male with  Principal Problem:   Fever Active Problems:   Type II diabetes mellitus, uncontrolled (HCC)   Mitral regurgitation and aortic stenosis   CKD (chronic kidney disease), stage III   S/P CABG (coronary artery bypass graft)   Persistent atrial fibrillation (HCC)   Dementia  Subjective: Patient reports feeling hot overnight with some confusion last night as well. He otherwise denies any dyspnea or change in his chronic cough. He denies current headache, abdominal pain, diarrhea, or constipation.   Abtx:  Anti-infectives    Start     Dose/Rate Route Frequency Ordered Stop   07/15/16 2200  levofloxacin (LEVAQUIN) IVPB 750 mg  Status:  Discontinued     750 mg 100 mL/hr over 90 Minutes Intravenous Daily at bedtime 07/15/16 0434 07/15/16 0929   07/15/16 0900  vancomycin (VANCOCIN) IVPB 750 mg/150 ml premix     750 mg 150 mL/hr over 60 Minutes Intravenous Every 12 hours 07/15/16 0434     07/15/16 0800  aztreonam (AZACTAM) 2 g in dextrose 5 % 50 mL IVPB  Status:  Discontinued     2 g 100 mL/hr over 30 Minutes Intravenous Every 8 hours 07/15/16 0434 07/15/16 1653   07/14/16 2115  levofloxacin (LEVAQUIN) IVPB 750 mg     750 mg 100 mL/hr over 90 Minutes Intravenous  Once 07/14/16 2103 07/14/16 2251   07/14/16 2115  aztreonam (AZACTAM) 2 g in dextrose 5 % 50 mL IVPB     2 g 100 mL/hr over 30 Minutes Intravenous  Once 07/14/16 2103 07/14/16 2320   07/14/16 2115  vancomycin (VANCOCIN) IVPB 1000 mg/200 mL premix     1,000 mg 200 mL/hr over 60 Minutes Intravenous  Once 07/14/16 2103 07/14/16 2245      Medications: I have reviewed the patient's current medications.  Objective: Vital signs in last 24 hours: Temp:  [98.1 F (36.7 C)-98.7 F (37.1 C)] 98.1 F (36.7 C) (05/08 1038) Pulse Rate:  [55-79] 55 (05/08 1038) Resp:  [17-18] 17 (05/08 1038) BP: (130-173)/(63-98) 139/63 (05/08 1038) SpO2:  [96 %-100 %]  96 % (05/08 1038) Weight:  [177 lb 11.1 oz (80.6 kg)] 177 lb 11.1 oz (80.6 kg) (05/07 2041)   General: resting in bed, no acute distress Cardiac: irregularly irregular, 2/6 systolic murmur heard best at left lower sternal border Pulm: clear to auscultation bilaterally, moving normal volumes of air Abd: soft, nontender, nondistended, BS present Ext: warm and well perfused, no pedal edema Neuro: alert and oriented to person, place, and month but not year   Lab Results  Recent Labs  07/15/16 0409 07/16/16 0556  WBC 20.8* 13.8*  HGB 10.6* 10.7*  HCT 34.1* 34.4*  NA 140 139  K 4.4 4.0  CL 114* 109  CO2 19* 21*  BUN 17 13  CREATININE 1.09 1.09   Liver Panel  Recent Labs  07/14/16 2100 07/15/16 0121  PROT 6.0* 5.5*  ALBUMIN 3.1* 2.9*  AST 17 20  ALT 10* 15*  ALKPHOS 64 58  BILITOT 0.7 0.5   Sedimentation Rate No results for input(s): ESRSEDRATE in the last 72 hours. C-Reactive Protein No results for input(s): CRP in the last 72 hours.  Microbiology: Recent Results (from the past 240 hour(s))  Culture, blood (Routine x 2)     Status: Abnormal (Preliminary result)   Collection Time: 07/14/16  9:00 PM  Result Value Ref Range Status   Specimen  Description BLOOD LEFT ARM  Final   Special Requests   Final    BOTTLES DRAWN AEROBIC AND ANAEROBIC Blood Culture adequate volume   Culture  Setup Time   Final    GRAM POSITIVE COCCI IN CLUSTERS AEROBIC BOTTLE ONLY CRITICAL RESULT CALLED TO, READ BACK BY AND VERIFIED WITH: N. Batchelder Pharm.D. 14:15 07/15/16 ([email or web address])    Culture (A)  Final    STAPHYLOCOCCUS AUREUS SUSCEPTIBILITIES TO FOLLOW    Report Status PENDING  Incomplete  Blood Culture ID Panel (Reflexed)     Status: Abnormal   Collection Time: 07/14/16  9:00 PM  Result Value Ref Range Status   Enterococcus species NOT DETECTED NOT DETECTED Final   Listeria monocytogenes NOT DETECTED NOT DETECTED Final   Staphylococcus species DETECTED (A) NOT DETECTED Final     Comment: CRITICAL RESULT CALLED TO, READ BACK BY AND VERIFIED WITH: N. Batchelder Pharm.D. 14:15 07/15/16 ([email or web address])    Staphylococcus aureus DETECTED (A) NOT DETECTED Final    Comment: Methicillin (oxacillin) susceptible Staphylococcus aureus (MSSA). Preferred therapy is anti staphylococcal beta lactam antibiotic (Cefazolin or Nafcillin), unless clinically contraindicated. CRITICAL RESULT CALLED TO, READ BACK BY AND VERIFIED WITH: N. Batchelder Pharm.D. 14:15 07/15/16 ([email or web address])    Methicillin resistance NOT DETECTED NOT DETECTED Final   Streptococcus species NOT DETECTED NOT DETECTED Final   Streptococcus agalactiae NOT DETECTED NOT DETECTED Final   Streptococcus pneumoniae NOT DETECTED NOT DETECTED Final   Streptococcus pyogenes NOT DETECTED NOT DETECTED Final   Acinetobacter baumannii NOT DETECTED NOT DETECTED Final   Enterobacteriaceae species NOT DETECTED NOT DETECTED Final   Enterobacter cloacae complex NOT DETECTED NOT DETECTED Final   Escherichia coli NOT DETECTED NOT DETECTED Final   Klebsiella oxytoca NOT DETECTED NOT DETECTED Final   Klebsiella pneumoniae NOT DETECTED NOT DETECTED Final   Proteus species NOT DETECTED NOT DETECTED Final   Serratia marcescens NOT DETECTED NOT DETECTED Final   Haemophilus influenzae NOT DETECTED NOT DETECTED Final   Neisseria meningitidis NOT DETECTED NOT DETECTED Final   Pseudomonas aeruginosa NOT DETECTED NOT DETECTED Final   Candida albicans NOT DETECTED NOT DETECTED Final   Candida glabrata NOT DETECTED NOT DETECTED Final   Candida krusei NOT DETECTED NOT DETECTED Final   Candida parapsilosis NOT DETECTED NOT DETECTED Final   Candida tropicalis NOT DETECTED NOT DETECTED Final  Culture, blood (Routine x 2)     Status: None (Preliminary result)   Collection Time: 07/14/16  9:13 PM  Result Value Ref Range Status   Specimen Description BLOOD RIGHT ARM  Final   Special Requests   Final    BOTTLES DRAWN AEROBIC AND ANAEROBIC Blood Culture adequate  volume   Culture NO GROWTH < 24 HOURS  Final   Report Status PENDING  Incomplete    Studies/Results: Dg Chest 2 View  Result Date: 07/14/2016 CLINICAL DATA:  Cough and mental status changes.  Fever. EXAM: CHEST  2 VIEW COMPARISON:  01/04/2016 FINDINGS: Prior median sternotomy. Apical lordotic frontal radiographs. Midline trachea. Moderate cardiomegaly. Atherosclerosis in the transverse aorta. No pleural effusion or pneumothorax. Hyperinflation. No congestive failure. No lobar consolidation. IMPRESSION: Cardiomegaly and hyperinflation, without acute disease. Electronically Signed   By: Abigail Miyamoto M.D.   On: 07/14/2016 21:55     Assessment/Plan: MSSA Bacteremia Fever CAD s/p CABG HTN DM Atrial Fibrillation  MSSA Bacteremia: Without clear source of infection. Antibiotics have been narrowed to Vancomycin alone. He has a reported allergy to penicillin which involves swelling of his face and throat. He has  not been trialed on cephalosporins as far as we can see in our system. Given his reported severe allergic reaction to penicillin (~ age 69), we have avoided Cefazolin due to possible cross-reactivity with cephalosporins. We can consider a cephalosporin trial while he is in a controlled supervised environment. MRI brain pending to assess headache, possible meningitis. - Continue Vancomycin alone given Penicillin allergy - f/u TTE --> will need TEE if TTE is negative for vegetations - follow repeat blood cultures drawn 5/8 for clearance - f/u MRI Brain  Total days of antibiotics: 3 Vancomycin/Levofloxacin/Aztreonam --> Vancomycin         Zada Finders, MD Internal Medicine Resident PGY-2 [email or web address] 07/16/2016, 1:46 PM  LOS: 2 days

## 2016-07-17 ENCOUNTER — Inpatient Hospital Stay (HOSPITAL_COMMUNITY): Payer: Medicare Other

## 2016-07-17 DIAGNOSIS — D72829 Elevated white blood cell count, unspecified: Secondary | ICD-10-CM

## 2016-07-17 DIAGNOSIS — I5022 Chronic systolic (congestive) heart failure: Secondary | ICD-10-CM

## 2016-07-17 DIAGNOSIS — I10 Essential (primary) hypertension: Secondary | ICD-10-CM

## 2016-07-17 DIAGNOSIS — R7881 Bacteremia: Secondary | ICD-10-CM

## 2016-07-17 LAB — BASIC METABOLIC PANEL
ANION GAP: 7 (ref 5–15)
BUN: 13 mg/dL (ref 6–20)
CALCIUM: 8.5 mg/dL — AB (ref 8.9–10.3)
CO2: 23 mmol/L (ref 22–32)
CREATININE: 1 mg/dL (ref 0.61–1.24)
Chloride: 106 mmol/L (ref 101–111)
Glucose, Bld: 207 mg/dL — ABNORMAL HIGH (ref 65–99)
Potassium: 3.8 mmol/L (ref 3.5–5.1)
Sodium: 136 mmol/L (ref 135–145)

## 2016-07-17 LAB — CBC
HCT: 33.1 % — ABNORMAL LOW (ref 39.0–52.0)
Hemoglobin: 10.5 g/dL — ABNORMAL LOW (ref 13.0–17.0)
MCH: 27.9 pg (ref 26.0–34.0)
MCHC: 31.7 g/dL (ref 30.0–36.0)
MCV: 87.8 fL (ref 78.0–100.0)
PLATELETS: 132 10*3/uL — AB (ref 150–400)
RBC: 3.77 MIL/uL — AB (ref 4.22–5.81)
RDW: 15.7 % — ABNORMAL HIGH (ref 11.5–15.5)
WBC: 11.4 10*3/uL — ABNORMAL HIGH (ref 4.0–10.5)

## 2016-07-17 LAB — GLUCOSE, CAPILLARY
GLUCOSE-CAPILLARY: 169 mg/dL — AB (ref 65–99)
GLUCOSE-CAPILLARY: 247 mg/dL — AB (ref 65–99)
GLUCOSE-CAPILLARY: 168 mg/dL — AB (ref 65–99)
Glucose-Capillary: 173 mg/dL — ABNORMAL HIGH (ref 65–99)

## 2016-07-17 LAB — CULTURE, BLOOD (ROUTINE X 2): Special Requests: ADEQUATE

## 2016-07-17 LAB — PROCALCITONIN: PROCALCITONIN: 0.29 ng/mL

## 2016-07-17 MED ORDER — GADOBENATE DIMEGLUMINE 529 MG/ML IV SOLN
15.0000 mL | Freq: Once | INTRAVENOUS | Status: AC
  Administered 2016-07-17: 15 mL via INTRAVENOUS

## 2016-07-17 NOTE — Consult Note (Signed)
[facility] CM Primary Care Navigator  07/17/2016  Barry Dennard Even Sr. [birth year] [id number]    Met withpatient and wife (Dena) at the bedside to identify possible discharge needs. Patient was asleep during this visit. Patient's wife reports that he had chest pain, fever and weakness that had led to this admission. Wife endorsesDr. [name] with [facility] as the primary care provider (since patient feels comfortable with him and refused to have another provider per wife).   Patient's wife shared using [facility] at [facility] obtain medications without difficulty so far.   She verbalized managing patient's medications at home.  Wife reports that she provides transportation to his doctors' appointments.  Wife is the primary caregiver at home as stated  Anticipated plan for discharge is home per wife.  Patient's wife voiced understanding to call primary care provider's office once he returns home, for a post discharge follow-up appointment within a week or sooner if needed.Patient letter (with PCP's contact number) was provided as a reminder.  Discussed withpatient's wife about [facility] CM services available for healthmanagement at home but plans to discuss with primary provideronfollow-up visit about the need for it since patient is not receptive to such.  She is aware to request for a referral to [facility] if deemed necessary andappropriate for it.   [facility] care management contact information provided for future needs that may arise.   For questions, please contact:  Dannielle Huh, BSN, RN- [facility] Primary Care Navigator  Telephone: [phone] [place]

## 2016-07-17 NOTE — Progress Notes (Addendum)
Patient ID: Barry Officer Sr., male   DOB: [birth year], 83 y.o.   MRN: [id number]  PROGRESS NOTE    Barry EVERINGHAM Sr.  MWN:[id number] DOB: [birth year] DOA: 07/14/2016  PCP: [name], MD   Brief Narrative:  83 year old male with past medical history significant for dementia, CAD s/p CABG in 11/2015, atrial fibrillation on Eliquis, diabetes mellitus, prostate cancer who presented to [facility] with fevers, malaise and chest pain. Patient also reported cough productive of white sputum. He was started on treatment for suspected community-acquired pneumonia. Blood cultures from 07/14/2016 are growing MSSA. ID following.    Assessment & Plan:   Sepsis secondary to MSSA bacteremia / Leukocytosis  - Sepsis criteria met on the admission with fever, leukocytosis, tachypnea - Blood cultures on 07/14/2016 growing MSSA - Blood cultures on 07/15/2016 so far show no growth to date - Infectious disease is following - Continue vancomycin - Echo completed, showed normal ejection fraction, no evidence of vegetation   Persistent headaches - MRI showed no acute intracranial abnormalities   Controlled type 2 diabetes mellitus - A1c on 03/22/2016 was 6.8, indicating good glycemic control - Continue sliding scale insulin  Chronic systolic CHF - Compensated - Ejection fraction 55% on this admission echo  Chronic atrial fibrillation - CHADS vasc score 6 - On anticoagulation with apixaban and aspirin - Rate is controlled without beta blockers  Essential hypertension - Continue lisinopril  Dyslipidemia associated with type 2 diabetes mellitus - Continue Crestor 10 mg at bedtime  Hypothyroidism - Continue Synthroid  Mild troponin elevation - Troponin 0.05, likely elevated due to demand ischemia from sepsis - Troponin level was not trended    DVT prophylaxis: On Apixaban Code Status: full code  Family Communication: No family at the bedside Disposition Plan: home once cleared by  ID   Consultants:   Infectious disease  Procedures:   2-D echo 07/16/2016 - ejection fraction 55%, mild LVH  Antimicrobials:   Aztreonam 07/14/2016 --> 07/15/2016   Levaquin 07/14/2016 --> 07/15/2016  Vancomycin 07/14/2016 -->   Subjective: No overnight events.   Objective: Vitals:   07/16/16 1700 07/16/16 2118 07/17/16 0419 07/17/16 1007  BP: (!) 151/76 (!) 152/90 (!) 158/71 (!) 148/78  Pulse: 83 93 77 70  Resp: '18 20 18 18  '$ Temp: 98.8 F (37.1 C) 98.8 F (37.1 C) 98.1 F (36.7 C) 98.2 F (36.8 C)  TempSrc: Oral Oral Oral Oral  SpO2: 100% 96% 98% 99%  Weight:  79.4 kg (175 lb)    Height:        Intake/Output Summary (Last 24 hours) at 07/17/16 1049 Last data filed at 07/17/16 0700  Gross per 24 hour  Intake              690 ml  Output              825 ml  Net             -135 ml   Filed Weights   07/15/16 0136 07/15/16 2041 07/16/16 2118  Weight: 85.8 kg (189 lb 3.2 oz) 80.6 kg (177 lb 11.1 oz) 79.4 kg (175 lb)    Examination:  General exam: Appears calm and comfortable  Respiratory system: Clear to auscultation. Respiratory effort normal. Cardiovascular system: S1 & S2 heard, RRR. No pedal edema. Gastrointestinal system: Abdomen is nondistended, soft and nontender. No organomegaly or masses felt. Normal bowel sounds heard. Central nervous system: Alert and oriented. No focal neurological deficits. Extremities: Symmetric 5 x 5  power. Skin: No rashes, lesions or ulcers Psychiatry: Judgement and insight appear normal. Mood & affect appropriate.   Data Reviewed: I have personally reviewed following labs and imaging studies  CBC:  Recent Labs Lab 07/14/16 2100 07/15/16 0121 07/15/16 0409 07/16/16 0556 07/17/16 0450  WBC 16.9* 20.1* 20.8* 13.8* 11.4*  NEUTROABS 15.2* 17.9*  --   --   --   HGB 11.3* 10.6* 10.6* 10.7* 10.5*  HCT 34.6* 33.9* 34.1* 34.4* 33.1*  MCV 88.3 88.5 88.3 88.4 87.8  PLT 124* 129* 127* 127* 846*   Basic Metabolic Panel:  Recent  Labs Lab 07/14/16 2100 07/15/16 0121 07/15/16 0409 07/16/16 0556 07/17/16 0450  NA 137 140 140 139 136  K 4.1 4.2 4.4 4.0 3.8  CL 110 113* 114* 109 106  CO2 19* 20* 19* 21* 23  GLUCOSE 167* 164* 149* 173* 207*  BUN '19 17 17 13 13  '$ CREATININE 1.12 1.10 1.09 1.09 1.00  CALCIUM 8.7* 8.0* 8.0* 8.6* 8.5*   GFR: Estimated Creatinine Clearance: 61.7 mL/min (by C-G formula based on SCr of 1 mg/dL). Liver Function Tests:  Recent Labs Lab 07/14/16 2100 07/15/16 0121  AST 17 20  ALT 10* 15*  ALKPHOS 64 58  BILITOT 0.7 0.5  PROT 6.0* 5.5*  ALBUMIN 3.1* 2.9*   No results for input(s): LIPASE, AMYLASE in the last 168 hours. No results for input(s): AMMONIA in the last 168 hours. Coagulation Profile:  Recent Labs Lab 07/14/16 2100  INR 1.12   Cardiac Enzymes:  Recent Labs Lab 07/15/16 1020  TROPONINI 0.05*   BNP (last 3 results) No results for input(s): PROBNP in the last 8760 hours. HbA1C: No results for input(s): HGBA1C in the last 72 hours. CBG:  Recent Labs Lab 07/16/16 0820 07/16/16 1154 07/16/16 1651 07/16/16 2124 07/17/16 0829  GLUCAP 212* 126* 141* 187* 169*   Lipid Profile: No results for input(s): CHOL, HDL, LDLCALC, TRIG, CHOLHDL, LDLDIRECT in the last 72 hours. Thyroid Function Tests: No results for input(s): TSH, T4TOTAL, FREET4, T3FREE, THYROIDAB in the last 72 hours. Anemia Panel: No results for input(s): VITAMINB12, FOLATE, FERRITIN, TIBC, IRON, RETICCTPCT in the last 72 hours. Urine analysis:    Component Value Date/Time   COLORURINE COLORLESS (A) 07/14/2016 2220   APPEARANCEUR CLEAR 07/14/2016 2220   LABSPEC 1.004 (L) 07/14/2016 2220   PHURINE 6.0 07/14/2016 2220   GLUCOSEU NEGATIVE 07/14/2016 2220   GLUCOSEU NEGATIVE 12/15/2015 1028   HGBUR NEGATIVE 07/14/2016 2220   BILIRUBINUR NEGATIVE 07/14/2016 2220   KETONESUR NEGATIVE 07/14/2016 2220   PROTEINUR NEGATIVE 07/14/2016 2220   UROBILINOGEN 1.0 12/15/2015 1028   NITRITE NEGATIVE  07/14/2016 2220   LEUKOCYTESUR NEGATIVE 07/14/2016 2220   Sepsis Labs: '@LABRCNTIP'$ (procalcitonin:4,lacticidven:4)   Recent Results (from the past 240 hour(s))  Culture, blood (Routine x 2)     Status: Abnormal (Preliminary result)   Collection Time: 07/14/16  9:00 PM  Result Value Ref Range Status   Specimen Description BLOOD LEFT ARM  Final   Special Requests   Final    BOTTLES DRAWN AEROBIC AND ANAEROBIC Blood Culture adequate volume   Culture  Setup Time   Final    GRAM POSITIVE COCCI IN CLUSTERS AEROBIC BOTTLE ONLY CRITICAL RESULT CALLED TO, READ BACK BY AND VERIFIED WITH: N. Batchelder Pharm.D. 14:15 07/15/16 ([email or web address])    Culture (A)  Final    STAPHYLOCOCCUS AUREUS SUSCEPTIBILITIES TO FOLLOW    Report Status PENDING  Incomplete  Blood Culture ID Panel (Reflexed)     Status:  Abnormal   Collection Time: 07/14/16  9:00 PM  Result Value Ref Range Status   Enterococcus species NOT DETECTED NOT DETECTED Final   Listeria monocytogenes NOT DETECTED NOT DETECTED Final   Staphylococcus species DETECTED (A) NOT DETECTED Final    Comment: CRITICAL RESULT CALLED TO, READ BACK BY AND VERIFIED WITH: N. Batchelder Pharm.D. 14:15 07/15/16 ([email or web address])    Staphylococcus aureus DETECTED (A) NOT DETECTED Final    Comment: Methicillin (oxacillin) susceptible Staphylococcus aureus (MSSA). Preferred therapy is anti staphylococcal beta lactam antibiotic (Cefazolin or Nafcillin), unless clinically contraindicated. CRITICAL RESULT CALLED TO, READ BACK BY AND VERIFIED WITH: N. Batchelder Pharm.D. 14:15 07/15/16 ([email or web address])    Methicillin resistance NOT DETECTED NOT DETECTED Final   Streptococcus species NOT DETECTED NOT DETECTED Final   Streptococcus agalactiae NOT DETECTED NOT DETECTED Final   Streptococcus pneumoniae NOT DETECTED NOT DETECTED Final   Streptococcus pyogenes NOT DETECTED NOT DETECTED Final   Acinetobacter baumannii NOT DETECTED NOT DETECTED Final   Enterobacteriaceae species NOT DETECTED  NOT DETECTED Final   Enterobacter cloacae complex NOT DETECTED NOT DETECTED Final   Escherichia coli NOT DETECTED NOT DETECTED Final   Klebsiella oxytoca NOT DETECTED NOT DETECTED Final   Klebsiella pneumoniae NOT DETECTED NOT DETECTED Final   Proteus species NOT DETECTED NOT DETECTED Final   Serratia marcescens NOT DETECTED NOT DETECTED Final   Haemophilus influenzae NOT DETECTED NOT DETECTED Final   Neisseria meningitidis NOT DETECTED NOT DETECTED Final   Pseudomonas aeruginosa NOT DETECTED NOT DETECTED Final   Candida albicans NOT DETECTED NOT DETECTED Final   Candida glabrata NOT DETECTED NOT DETECTED Final   Candida krusei NOT DETECTED NOT DETECTED Final   Candida parapsilosis NOT DETECTED NOT DETECTED Final   Candida tropicalis NOT DETECTED NOT DETECTED Final  Culture, blood (Routine x 2)     Status: None (Preliminary result)   Collection Time: 07/14/16  9:13 PM  Result Value Ref Range Status   Specimen Description BLOOD RIGHT ARM  Final   Special Requests   Final    BOTTLES DRAWN AEROBIC AND ANAEROBIC Blood Culture adequate volume   Culture NO GROWTH 2 DAYS  Final   Report Status PENDING  Incomplete  Culture, blood (single) w Reflex to ID Panel     Status: None (Preliminary result)   Collection Time: 07/15/16  2:54 PM  Result Value Ref Range Status   Specimen Description BLOOD BLOOD LEFT ARM  Final   Special Requests IN PEDIATRIC BOTTLE Blood Culture adequate volume  Final   Culture NO GROWTH < 24 HOURS  Final   Report Status PENDING  Incomplete      Radiology Studies: Dg Chest 2 View Result Date: 07/14/2016 Cardiomegaly and hyperinflation, without acute disease.   Mr Jeri Cos Wo Contrast Result Date: 07/17/2016  1.  No acute intracranial abnormality. 2. Largely stable MRI appearance of the brain since 2016. Advanced chronic signal changes in the bilateral cerebral white matter and deep gray matter most compatible with chronic small vessel disease. A solitary chronic  microhemorrhage in the left parietal lobe is new since the prior MRI.     Scheduled Meds: . apixaban  5 mg Oral BID  . aspirin EC  81 mg Oral Daily  . ferrous sulfate  325 mg Oral Q supper  . finasteride  5 mg Oral Daily  . guaiFENesin  600 mg Oral BID  . insulin aspart  0-9 Units Subcutaneous TID WC  . levothyroxine  112 mcg Oral QAC breakfast  .  lisinopril  20 mg Oral Daily  . LORazepam  0.5 mg Intravenous Once  . rosuvastatin  10 mg Oral QHS  . sodium chloride flush  3 mL Intravenous Q12H  . triamcinolone  2 spray Nasal Daily  . vitamin B-12  1,000 mcg Oral Daily   Continuous Infusions: . sodium chloride    . vancomycin 750 mg (07/17/16 1022)     LOS: 3 days    Time spent: 25 minutes  Greater than 50% of the time spent on counseling and coordinating the care.   Leisa Lenz, MD [facility] Pager [phone]  If 7PM-7AM, please contact night-coverage [email or web address] Password [email or web address] 07/17/2016, 10:49 AM

## 2016-07-17 NOTE — Progress Notes (Signed)
INFECTIOUS DISEASE PROGRESS NOTE  ID: Barry Odor Behring Sr. is a 83 y.o. male with  Principal Problem:   Fever Active Problems:   Type II diabetes mellitus, uncontrolled (HCC)   Mitral regurgitation and aortic stenosis   CKD (chronic kidney disease), stage III   S/P CABG (coronary artery bypass graft)   Persistent atrial fibrillation (HCC)   Dementia   Bacteremia  Subjective: Patient in MRI during rounds.  Abtx:  Anti-infectives    Start     Dose/Rate Route Frequency Ordered Stop   07/15/16 2200  levofloxacin (LEVAQUIN) IVPB 750 mg  Status:  Discontinued     750 mg 100 mL/hr over 90 Minutes Intravenous Daily at bedtime 07/15/16 0434 07/15/16 0929   07/15/16 0900  vancomycin (VANCOCIN) IVPB 750 mg/150 ml premix     750 mg 150 mL/hr over 60 Minutes Intravenous Every 12 hours 07/15/16 0434     07/15/16 0800  aztreonam (AZACTAM) 2 g in dextrose 5 % 50 mL IVPB  Status:  Discontinued     2 g 100 mL/hr over 30 Minutes Intravenous Every 8 hours 07/15/16 0434 07/15/16 1653   07/14/16 2115  levofloxacin (LEVAQUIN) IVPB 750 mg     750 mg 100 mL/hr over 90 Minutes Intravenous  Once 07/14/16 2103 07/14/16 2251   07/14/16 2115  aztreonam (AZACTAM) 2 g in dextrose 5 % 50 mL IVPB     2 g 100 mL/hr over 30 Minutes Intravenous  Once 07/14/16 2103 07/14/16 2320   07/14/16 2115  vancomycin (VANCOCIN) IVPB 1000 mg/200 mL premix     1,000 mg 200 mL/hr over 60 Minutes Intravenous  Once 07/14/16 2103 07/14/16 2245      Medications: I have reviewed the patient's current medications.  Objective: Vital signs in last 24 hours: Temp:  [98.1 F (36.7 C)-98.8 F (37.1 C)] 98.1 F (36.7 C) (05/09 0419) Pulse Rate:  [55-93] 77 (05/09 0419) Resp:  [17-20] 18 (05/09 0419) BP: (139-158)/(63-90) 158/71 (05/09 0419) SpO2:  [96 %-100 %] 98 % (05/09 0419) Weight:  [175 lb (79.4 kg)] 175 lb (79.4 kg) (05/08 2118)    Lab Results  Recent Labs  07/16/16 0556 07/17/16 0450  WBC 13.8* 11.4*  HGB  10.7* 10.5*  HCT 34.4* 33.1*  NA 139 136  K 4.0 3.8  CL 109 106  CO2 21* 23  BUN 13 13  CREATININE 1.09 1.00   Liver Panel  Recent Labs  07/14/16 2100 07/15/16 0121  PROT 6.0* 5.5*  ALBUMIN 3.1* 2.9*  AST 17 20  ALT 10* 15*  ALKPHOS 64 58  BILITOT 0.7 0.5   Sedimentation Rate No results for input(s): ESRSEDRATE in the last 72 hours. C-Reactive Protein No results for input(s): CRP in the last 72 hours.  Microbiology: Recent Results (from the past 240 hour(s))  Culture, blood (Routine x 2)     Status: Abnormal (Preliminary result)   Collection Time: 07/14/16  9:00 PM  Result Value Ref Range Status   Specimen Description BLOOD LEFT ARM  Final   Special Requests   Final    BOTTLES DRAWN AEROBIC AND ANAEROBIC Blood Culture adequate volume   Culture  Setup Time   Final    GRAM POSITIVE COCCI IN CLUSTERS AEROBIC BOTTLE ONLY CRITICAL RESULT CALLED TO, READ BACK BY AND VERIFIED WITH: N. Batchelder Pharm.D. 14:15 07/15/16 ([email or web address])    Culture (A)  Final    STAPHYLOCOCCUS AUREUS SUSCEPTIBILITIES TO FOLLOW    Report Status PENDING  Incomplete  Blood  Culture ID Panel (Reflexed)     Status: Abnormal   Collection Time: 07/14/16  9:00 PM  Result Value Ref Range Status   Enterococcus species NOT DETECTED NOT DETECTED Final   Listeria monocytogenes NOT DETECTED NOT DETECTED Final   Staphylococcus species DETECTED (A) NOT DETECTED Final    Comment: CRITICAL RESULT CALLED TO, READ BACK BY AND VERIFIED WITH: N. Batchelder Pharm.D. 14:15 07/15/16 ([email or web address])    Staphylococcus aureus DETECTED (A) NOT DETECTED Final    Comment: Methicillin (oxacillin) susceptible Staphylococcus aureus (MSSA). Preferred therapy is anti staphylococcal beta lactam antibiotic (Cefazolin or Nafcillin), unless clinically contraindicated. CRITICAL RESULT CALLED TO, READ BACK BY AND VERIFIED WITH: N. Batchelder Pharm.D. 14:15 07/15/16 ([email or web address])    Methicillin resistance NOT DETECTED NOT DETECTED Final    Streptococcus species NOT DETECTED NOT DETECTED Final   Streptococcus agalactiae NOT DETECTED NOT DETECTED Final   Streptococcus pneumoniae NOT DETECTED NOT DETECTED Final   Streptococcus pyogenes NOT DETECTED NOT DETECTED Final   Acinetobacter baumannii NOT DETECTED NOT DETECTED Final   Enterobacteriaceae species NOT DETECTED NOT DETECTED Final   Enterobacter cloacae complex NOT DETECTED NOT DETECTED Final   Escherichia coli NOT DETECTED NOT DETECTED Final   Klebsiella oxytoca NOT DETECTED NOT DETECTED Final   Klebsiella pneumoniae NOT DETECTED NOT DETECTED Final   Proteus species NOT DETECTED NOT DETECTED Final   Serratia marcescens NOT DETECTED NOT DETECTED Final   Haemophilus influenzae NOT DETECTED NOT DETECTED Final   Neisseria meningitidis NOT DETECTED NOT DETECTED Final   Pseudomonas aeruginosa NOT DETECTED NOT DETECTED Final   Candida albicans NOT DETECTED NOT DETECTED Final   Candida glabrata NOT DETECTED NOT DETECTED Final   Candida krusei NOT DETECTED NOT DETECTED Final   Candida parapsilosis NOT DETECTED NOT DETECTED Final   Candida tropicalis NOT DETECTED NOT DETECTED Final  Culture, blood (Routine x 2)     Status: None (Preliminary result)   Collection Time: 07/14/16  9:13 PM  Result Value Ref Range Status   Specimen Description BLOOD RIGHT ARM  Final   Special Requests   Final    BOTTLES DRAWN AEROBIC AND ANAEROBIC Blood Culture adequate volume   Culture NO GROWTH 2 DAYS  Final   Report Status PENDING  Incomplete  Culture, blood (single) w Reflex to ID Panel     Status: None (Preliminary result)   Collection Time: 07/15/16  2:54 PM  Result Value Ref Range Status   Specimen Description BLOOD BLOOD LEFT ARM  Final   Special Requests IN PEDIATRIC BOTTLE Blood Culture adequate volume  Final   Culture NO GROWTH < 24 HOURS  Final   Report Status PENDING  Incomplete    Studies/Results: No results found.   Assessment/Plan: MSSA Bacteremia Fever CAD s/p  CABG HTN DM Atrial Fibrillation  MSSA Bacteremia: Without clear source of infection. Antibiotics have been narrowed to Vancomycin alone. He has a reported allergy to penicillin which involves swelling of his face and throat. He has not been trialed on cephalosporins as far as we can see in our system.  - Continue Vancomycin alone given Penicillin allergy - TTE w/o comment on vegetations --> will need TEE to evaluate for vegetations and determine duration of antibiotics; will also be useful to assess aortic stenosis - MRI Brain (-) - f/u blood cultures for clearance  BCx 5/6 >> 1/2 MSSA BCx 5/7 >> No Growth < 24h BCx 5/8 >> pending   Total days of antibiotics: 3 Vancomycin/Levofloxacin/Aztreonam --> Vancomycin  Zada Finders, MD Internal Medicine Resident PGY-2 [email or web address] 07/17/2016, 9:06 AM  LOS: 3 days    Pt seen, Dr Serita Grit note reviewed, confirmed.  Await TEE to determine length of therapy.

## 2016-07-18 DIAGNOSIS — I481 Persistent atrial fibrillation: Secondary | ICD-10-CM

## 2016-07-18 DIAGNOSIS — E785 Hyperlipidemia, unspecified: Secondary | ICD-10-CM

## 2016-07-18 DIAGNOSIS — R509 Fever, unspecified: Secondary | ICD-10-CM

## 2016-07-18 LAB — CBC
HCT: 33.5 % — ABNORMAL LOW (ref 39.0–52.0)
HEMOGLOBIN: 10.8 g/dL — AB (ref 13.0–17.0)
MCH: 28.1 pg (ref 26.0–34.0)
MCHC: 32.2 g/dL (ref 30.0–36.0)
MCV: 87.2 fL (ref 78.0–100.0)
Platelets: 139 10*3/uL — ABNORMAL LOW (ref 150–400)
RBC: 3.84 MIL/uL — ABNORMAL LOW (ref 4.22–5.81)
RDW: 15.3 % (ref 11.5–15.5)
WBC: 9.6 10*3/uL (ref 4.0–10.5)

## 2016-07-18 LAB — BASIC METABOLIC PANEL
ANION GAP: 10 (ref 5–15)
BUN: 13 mg/dL (ref 6–20)
CALCIUM: 8.4 mg/dL — AB (ref 8.9–10.3)
CO2: 22 mmol/L (ref 22–32)
CREATININE: 0.94 mg/dL (ref 0.61–1.24)
Chloride: 107 mmol/L (ref 101–111)
GFR calc Af Amer: >60 mL/min (ref >60)
GFR calc non Af Amer: >60 mL/min (ref >60)
GLUCOSE: 172 mg/dL — AB (ref 65–99)
Potassium: 4 mmol/L (ref 3.5–5.1)
Sodium: 139 mmol/L (ref 135–145)

## 2016-07-18 LAB — GLUCOSE, CAPILLARY
GLUCOSE-CAPILLARY: 168 mg/dL — AB (ref 65–99)
GLUCOSE-CAPILLARY: 173 mg/dL — AB (ref 65–99)
Glucose-Capillary: 215 mg/dL — ABNORMAL HIGH (ref 65–99)
Glucose-Capillary: 185 mg/dL — ABNORMAL HIGH (ref 65–99)

## 2016-07-18 LAB — VANCOMYCIN, TROUGH: Vancomycin Tr: 19 ug/mL (ref 15–20)

## 2016-07-18 MED ORDER — LEVALBUTEROL HCL 1.25 MG/0.5ML IN NEBU
1.2500 mg | INHALATION_SOLUTION | Freq: Once | RESPIRATORY_TRACT | Status: AC
  Administered 2016-07-18: 1.25 mg via RESPIRATORY_TRACT
  Filled 2016-07-18: qty 0.5

## 2016-07-18 NOTE — Care Management Important Message (Signed)
Important Message  Patient Details  Name: Barry RENSTROM Sr. MRN: [id number] Date of Birth: 1932/05/01   Medicare Important Message Given:  Yes    Barry Mcdaniel 07/18/2016, 11:47 AM

## 2016-07-18 NOTE — Progress Notes (Signed)
CHANTRY HEADEN Sr. [id number] [birth year]  Called by RN this am as pt c/o increasing sob. On my exam, pt with expiratory wheeze bilateral upper lobes. I do not hear crackles at this time. He denies any chest pain, O2 sat 95% RA, VSS. He states he uses albuterol inhaler at home for similar symptoms. Will order for neb treatment x 1. Monitor. Will notify rounding MD of new orders.  Patrici Ranks, NP-C [facility] [place]  pgr [phone].

## 2016-07-18 NOTE — Progress Notes (Addendum)
Patient ID: Barry Officer Sr., male   DOB: 06-12-1932, 83 y.o.   MRN: [id number]  PROGRESS NOTE    Barry SHARTZER Sr.  YBO:[id number] DOB: October 22, 1932 DOA: 07/14/2016  PCP: Barry Snare, Barry Mcdaniel   Brief Narrative:  83 year old male with past medical history significant for dementia, CAD s/p CABG in 11/2015, atrial fibrillation on Eliquis, diabetes mellitus, prostate cancer who presented to [facility] with fevers, malaise and chest pain. Patient also reported cough productive of white sputum. He was started on treatment for suspected community-acquired pneumonia. Blood cultures from 07/14/2016 are growing MSSA. ID following.   Assessment & Plan:   Sepsis secondary to MSSA bacteremia / Leukocytosis  - Sepsis criteria met on admission with fever, leukocytosis and tachypnea - Source of infection is MSSA bacteremia from blood cx on 5/6 - Pt on vanco for now - Awaiting TEE, called cardio to see if it can be set up - Repeat blood cx showed no growth  - ID following  - No vegetation on ECHO  Persistent headaches - MRI without acute abnormalities   Controlled type 2 diabetes mellitus - A1c 6.8  In 03/2016 - Continue SSI - CBG's in past 24 hours:  173, 168, 277  Chronic systolic CHF - ECHO showed EF 55% - Stable   Chronic atrial fibrillation - CHADS vasc score 6 - Continue apixaban and aspirin  Thrombocytopenia - Monitor platelets as pt on apixaban and aspirin  - Platelets 139 this am   Iron deficiency anemia - Continue ferrous sulfate supplementation   Essential hypertension - Stable - Continue lisinopril   Dyslipidemia associated with type 2 diabetes mellitus - Continue statin therapy   Hypothyroidism - Continue synthroid   Mild troponin elevation - Mild, likely demand ischemia from sepsis   DVT prophylaxis: On Apixaban Code Status: full code  Family Communication: wife at bedside  Disposition Plan: needs TEE, called cardio to see if it was set up   Consultants:     ID  Procedures:   2 D ECHO 5/8 - EF 55%, LVH  Antimicrobials:   Aztreonam 07/14/2016 --> 07/15/2016   Levaquin 07/14/2016 --> 07/15/2016  Vancomycin 07/14/2016 -->   Subjective: Feels better, wants to go home.  Objective: Vitals:   07/18/16 0449 07/18/16 0639 07/18/16 0933 07/18/16 0959  BP: (!) 157/73  98/75 113/68  Pulse: (!) 51  87   Resp: 17  18   Temp: 99 F (37.2 C)  98 F (36.7 C)   TempSrc: Oral  Oral   SpO2: 99% 95% 94%   Weight:      Height:        Intake/Output Summary (Last 24 hours) at 07/18/16 1130 Last data filed at 07/18/16 0934  Gross per 24 hour  Intake             1380 ml  Output              475 ml  Net              905 ml   Filed Weights   07/15/16 2041 07/16/16 2118 07/17/16 2155  Weight: 80.6 kg (177 lb 11.1 oz) 79.4 kg (175 lb) 72.6 kg (160 lb)    Examination:  General exam: no distress   Respiratory system: no wheezing, no rhonchi  Cardiovascular system: S1 & S2 heard, Rate controlled  Gastrointestinal system: (+) BS, non tender abd Central nervous system: No focal neurological deficits. Extremities: No swelling, palpable pulses  Skin: warm and dry  Psychiatry: Normal mood and behavior    Data Reviewed: I have personally reviewed following labs and imaging studies  CBC:  Recent Labs Lab 07/14/16 2100 07/15/16 0121 07/15/16 0409 07/16/16 0556 07/17/16 0450 07/18/16 0424  WBC 16.9* 20.1* 20.8* 13.8* 11.4* 9.6  NEUTROABS 15.2* 17.9*  --   --   --   --   HGB 11.3* 10.6* 10.6* 10.7* 10.5* 10.8*  HCT 34.6* 33.9* 34.1* 34.4* 33.1* 33.5*  MCV 88.3 88.5 88.3 88.4 87.8 87.2  PLT 124* 129* 127* 127* 132* 703*   Basic Metabolic Panel:  Recent Labs Lab 07/15/16 0121 07/15/16 0409 07/16/16 0556 07/17/16 0450 07/18/16 0424  NA 140 140 139 136 139  K 4.2 4.4 4.0 3.8 4.0  CL 113* 114* 109 106 107  CO2 20* 19* 21* 23 22  GLUCOSE 164* 149* 173* 207* 172*  BUN _0 CREATININE 1.10 1.09 1.09 1.00 0.94  CALCIUM 8.0*  8.0* 8.6* 8.5* 8.4*   GFR: Estimated Creatinine Clearance: 63.3 mL/min (by C-G formula based on SCr of 0.94 mg/dL). Liver Function Tests:  Recent Labs Lab 07/14/16 2100 07/15/16 0121  AST 17 20  ALT 10* 15*  ALKPHOS 64 58  BILITOT 0.7 0.5  PROT 6.0* 5.5*  ALBUMIN 3.1* 2.9*   No results for input(s): LIPASE, AMYLASE in the last 168 hours. No results for input(s): AMMONIA in the last 168 hours. Coagulation Profile:  Recent Labs Lab 07/14/16 2100  INR 1.12   Cardiac Enzymes:  Recent Labs Lab 07/15/16 1020  TROPONINI 0.05*   BNP (last 3 results) No results for input(s): PROBNP in the last 8760 hours. HbA1C: No results for input(s): HGBA1C in the last 72 hours. CBG:  Recent Labs Lab 07/17/16 0829 07/17/16 1158 07/17/16 1641 07/17/16 2152 07/18/16 0817  GLUCAP 169* 247* 173* 168* 168*   Lipid Profile: No results for input(s): CHOL, HDL, LDLCALC, TRIG, CHOLHDL, LDLDIRECT in the last 72 hours. Thyroid Function Tests: No results for input(s): TSH, T4TOTAL, FREET4, T3FREE, THYROIDAB in the last 72 hours. Anemia Panel: No results for input(s): VITAMINB12, FOLATE, FERRITIN, TIBC, IRON, RETICCTPCT in the last 72 hours. Urine analysis:    Component Value Date/Time   COLORURINE COLORLESS (A) 07/14/2016 2220   APPEARANCEUR CLEAR 07/14/2016 2220   LABSPEC 1.004 (L) 07/14/2016 2220   PHURINE 6.0 07/14/2016 2220   GLUCOSEU NEGATIVE 07/14/2016 2220   GLUCOSEU NEGATIVE 12/15/2015 1028   HGBUR NEGATIVE 07/14/2016 2220   BILIRUBINUR NEGATIVE 07/14/2016 2220   KETONESUR NEGATIVE 07/14/2016 2220   PROTEINUR NEGATIVE 07/14/2016 2220   UROBILINOGEN 1.0 12/15/2015 1028   NITRITE NEGATIVE 07/14/2016 2220   LEUKOCYTESUR NEGATIVE 07/14/2016 2220   Sepsis Labs: _1 (procalcitonin:4,lacticidven:4)   ) Recent Results (from the past 240 hour(s))  Culture, blood (Routine x 2)     Status: Abnormal   Collection Time: 07/14/16  9:00 PM  Result Value Ref Range Status    Specimen Description BLOOD LEFT ARM  Final   Special Requests   Final    BOTTLES DRAWN AEROBIC AND ANAEROBIC Blood Culture adequate volume   Culture  Setup Time   Final    GRAM POSITIVE COCCI IN CLUSTERS AEROBIC BOTTLE ONLY CRITICAL RESULT CALLED TO, READ BACK BY AND VERIFIED WITH: N. Batchelder Pharm.D. 14:15 07/15/16 ([email or web address])    Culture STAPHYLOCOCCUS AUREUS (A)  Final   Report Status 07/17/2016 FINAL  Final   Organism ID, Bacteria STAPHYLOCOCCUS AUREUS  Final      Susceptibility   Staphylococcus aureus -  MIC*    CIPROFLOXACIN <=0.5 SENSITIVE Sensitive     ERYTHROMYCIN <=0.25 SENSITIVE Sensitive     GENTAMICIN <=0.5 SENSITIVE Sensitive     OXACILLIN <=0.25 SENSITIVE Sensitive     TETRACYCLINE <=1 SENSITIVE Sensitive     VANCOMYCIN <=0.5 SENSITIVE Sensitive     TRIMETH/SULFA <=10 SENSITIVE Sensitive     CLINDAMYCIN <=0.25 SENSITIVE Sensitive     RIFAMPIN <=0.5 SENSITIVE Sensitive     Inducible Clindamycin NEGATIVE Sensitive     * STAPHYLOCOCCUS AUREUS  Blood Culture ID Panel (Reflexed)     Status: Abnormal   Collection Time: 07/14/16  9:00 PM  Result Value Ref Range Status   Enterococcus species NOT DETECTED NOT DETECTED Final   Listeria monocytogenes NOT DETECTED NOT DETECTED Final   Staphylococcus species DETECTED (A) NOT DETECTED Final    Comment: CRITICAL RESULT CALLED TO, READ BACK BY AND VERIFIED WITH: N. Batchelder Pharm.D. 14:15 07/15/16 ([email or web address])    Staphylococcus aureus DETECTED (A) NOT DETECTED Final    Comment: Methicillin (oxacillin) susceptible Staphylococcus aureus (MSSA). Preferred therapy is anti staphylococcal beta lactam antibiotic (Cefazolin or Nafcillin), unless clinically contraindicated. CRITICAL RESULT CALLED TO, READ BACK BY AND VERIFIED WITH: N. Batchelder Pharm.D. 14:15 07/15/16 ([email or web address])    Methicillin resistance NOT DETECTED NOT DETECTED Final   Streptococcus species NOT DETECTED NOT DETECTED Final   Streptococcus agalactiae NOT DETECTED NOT DETECTED  Final   Streptococcus pneumoniae NOT DETECTED NOT DETECTED Final   Streptococcus pyogenes NOT DETECTED NOT DETECTED Final   Acinetobacter baumannii NOT DETECTED NOT DETECTED Final   Enterobacteriaceae species NOT DETECTED NOT DETECTED Final   Enterobacter cloacae complex NOT DETECTED NOT DETECTED Final   Escherichia coli NOT DETECTED NOT DETECTED Final   Klebsiella oxytoca NOT DETECTED NOT DETECTED Final   Klebsiella pneumoniae NOT DETECTED NOT DETECTED Final   Proteus species NOT DETECTED NOT DETECTED Final   Serratia marcescens NOT DETECTED NOT DETECTED Final   Haemophilus influenzae NOT DETECTED NOT DETECTED Final   Neisseria meningitidis NOT DETECTED NOT DETECTED Final   Pseudomonas aeruginosa NOT DETECTED NOT DETECTED Final   Candida albicans NOT DETECTED NOT DETECTED Final   Candida glabrata NOT DETECTED NOT DETECTED Final   Candida krusei NOT DETECTED NOT DETECTED Final   Candida parapsilosis NOT DETECTED NOT DETECTED Final   Candida tropicalis NOT DETECTED NOT DETECTED Final  Culture, blood (Routine x 2)     Status: None (Preliminary result)   Collection Time: 07/14/16  9:13 PM  Result Value Ref Range Status   Specimen Description BLOOD RIGHT ARM  Final   Special Requests   Final    BOTTLES DRAWN AEROBIC AND ANAEROBIC Blood Culture adequate volume   Culture NO GROWTH 3 DAYS  Final   Report Status PENDING  Incomplete  Culture, blood (single) w Reflex to ID Panel     Status: None (Preliminary result)   Collection Time: 07/15/16  2:54 PM  Result Value Ref Range Status   Specimen Description BLOOD BLOOD LEFT ARM  Final   Special Requests IN PEDIATRIC BOTTLE Blood Culture adequate volume  Final   Culture NO GROWTH 2 DAYS  Final   Report Status PENDING  Incomplete  Culture, blood (single)     Status: None (Preliminary result)   Collection Time: 07/16/16  5:56 AM  Result Value Ref Range Status   Specimen Description BLOOD RIGHT ANTECUBITAL  Final   Special Requests   Final     BOTTLES DRAWN AEROBIC ONLY Blood Culture adequate volume  Culture NO GROWTH 1 DAY  Final   Report Status PENDING  Incomplete      Radiology Studies: Dg Chest 2 View  Result Date: 07/14/2016 CLINICAL DATA:  Cough and mental status changes.  Fever. EXAM: CHEST  2 VIEW COMPARISON:  01/04/2016 FINDINGS: Prior median sternotomy. Apical lordotic frontal radiographs. Midline trachea. Moderate cardiomegaly. Atherosclerosis in the transverse aorta. No pleural effusion or pneumothorax. Hyperinflation. No congestive failure. No lobar consolidation. IMPRESSION: Cardiomegaly and hyperinflation, without acute disease. Electronically Signed   By: Abigail Miyamoto M.D.   On: 07/14/2016 21:55   Mr Jeri Cos JQ Contrast  Result Date: 07/17/2016 CLINICAL DATA:  83 year old male with headache, neck pain, recent bacteremia (MSSA) and fever. EXAM: MRI HEAD WITHOUT AND WITH CONTRAST TECHNIQUE: Multiplanar, multiecho pulse sequences of the brain and surrounding structures were obtained without and with intravenous contrast. CONTRAST:  7m MULTIHANCE GADOBENATE DIMEGLUMINE 529 MG/ML IV SOLN COMPARISON:  Brain MRI 01/17/2015 FINDINGS: Brain: Cerebral volume is not significantly changed since 2016. No restricted diffusion to suggest acute infarction. No midline shift, mass effect, evidence of mass lesion, ventriculomegaly, extra-axial collection or acute intracranial hemorrhage. Cervicomedullary junction and pituitary are within normal limits. Mild cavum septum pellucidum (normal variant). Patchy and confluent chronic bilateral cerebral white matter T2 and FLAIR hyperintensity. Moderate T2 heterogeneity in the bilateral deep gray matter nuclei. Evidence of a chronic microhemorrhage in the left parietal lobe which is new since 2016 (series 8, image 16). No other chronic cerebral blood products or new signal abnormality. No cortical edema or encephalomalacia identified. The brainstem and cerebellum remain normal for age. No abnormal  enhancement identified. No dural thickening. 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 Skull and upper cervical spine: Negative. Bone marrow signal remains normal. Sinuses/Orbits: Stable and negative. Other: Mastoids remain clear. Visible internal auditory structures appear normal. Negative scalp soft tissues. IMPRESSION: 1.  No acute intracranial abnormality. 2. Largely stable MRI appearance of the brain since 2016. Advanced chronic signal changes in the bilateral cerebral white matter and deep gray matter most compatible with chronic small vessel disease. A solitary chronic microhemorrhage in the left parietal lobe is new since the prior MRI. Electronically Signed   By: HGenevie AnnM.D.   On: 07/17/2016 09:54     Scheduled Meds: . apixaban  5 mg Oral BID  . aspirin EC  81 mg Oral Daily  . ferrous sulfate  325 mg Oral Q supper  . finasteride  5 mg Oral Daily  . guaiFENesin  600 mg Oral BID  . insulin aspart  0-9 Units Subcutaneous TID WC  . levothyroxine  112 mcg Oral QAC breakfast  . lisinopril  20 mg Oral Daily  . LORazepam  0.5 mg Intravenous Once  . rosuvastatin  10 mg Oral QHS  . sodium chloride flush  3 mL Intravenous Q12H  . triamcinolone  2 spray Nasal Daily  . vitamin B-12  1,000 mcg Oral Daily   Continuous Infusions: . sodium chloride    . vancomycin Stopped (07/18/16 1059)     LOS: 4 days    Time spent: 25 minutes  Greater than 50% of the time spent on counseling and coordinating the care.   ALeisa Lenz Barry Mcdaniel [facility] Pager [phone] If 7PM-7AM, please contact night-coverage [email or web address] Password [email or web address] 07/18/2016, 11:30 AM

## 2016-07-18 NOTE — Progress Notes (Signed)
Physical Therapy Treatment Patient Details Name: Barry MULLANE Sr. MRN: [id number] DOB: 07/13/1932 Today's Date: 07/18/2016    History of Present Illness HPI: Barry FARRINGTON Sr. is a 83 y.o. male with medical history significant of dementia, CABG, HTN brought in by wife for fever. MSSA bacteremia    PT Comments    Pt continues to make improvements with gait and transfers this session. Pt is refusing any PT follow-up when therapist mentioned outpatient services. Pt refusing stair negotiation this session as he feels he will have no difficulty when he returns home. Will retry next session.     Follow Up Recommendations  No PT follow up;Other (comment) (pt refusing any PT follow-up)     Equipment Recommendations  None recommended by PT    Recommendations for Other Services       Precautions / Restrictions Precautions Precautions: Fall Restrictions Weight Bearing Restrictions: No    Mobility  Bed Mobility Overal bed mobility: Needs Assistance Bed Mobility: Supine to Sit     Supine to sit: Supervision     General bed mobility comments: Cues to initiate  Transfers Overall transfer level: Needs assistance Equipment used: None Transfers: Sit to/from Stand Sit to Stand: Min guard         General transfer comment: Slow movign, but able to stand without physical assist  Ambulation/Gait Ambulation/Gait assistance: Min guard Ambulation Distance (Feet): 250 Feet Assistive device: None Gait Pattern/deviations: Step-through pattern;Wide base of support Gait velocity: decreased Gait velocity interpretation: Below normal speed for age/gender General Gait Details: cues for self monitor of activity and environment, slow moving with [employer]   Stairs            Wheelchair Mobility    Modified Rankin (Stroke Patients Only)       Balance Overall balance assessment: Needs assistance Sitting-balance support: Feet supported Sitting balance-Leahy Scale: Good     Standing  balance support: No upper extremity supported Standing balance-Leahy Scale: Good                              Cognition Arousal/Alertness: Awake/alert Behavior During Therapy: WFL for tasks assessed/performed Overall Cognitive Status: History of cognitive impairments - at baseline                                        Exercises      General Comments        Pertinent Vitals/Pain Pain Assessment: No/denies pain    Home Living                      Prior Function            PT Goals (current goals can now be found in the care plan section) Acute Rehab PT Goals Patient Stated Goal: Home soon Progress towards PT goals: Progressing toward goals    Frequency    Min 3X/week      PT Plan Current plan remains appropriate    Co-evaluation              AM-PAC PT "6 Clicks" Daily Activity  Outcome Measure  Difficulty turning over in bed (including adjusting bedclothes, sheets and blankets)?: A Little Difficulty moving from lying on back to sitting on the side of the bed? : A Little Difficulty sitting down on and standing up from a  chair with arms (e.g., wheelchair, bedside commode, etc,.)?: A Little Help needed moving to and from a bed to chair (including a wheelchair)?: A Little Help needed walking in hospital room?: A Little Help needed climbing 3-5 steps with a railing? : A Little 6 Click Score: 18    End of Session Equipment Utilized During Treatment: Gait belt Activity Tolerance: Patient tolerated treatment well Patient left: in chair;with call bell/phone within reach;with chair alarm set;with family/visitor present Nurse Communication: Mobility status PT Visit Diagnosis: Muscle weakness (generalized) (M62.81)     Time: 0233-4356 PT Time Calculation (min) (ACUTE ONLY): 19 min  Charges:  $Gait Training: 8-22 mins                    G Codes:       Scheryl Marten PT, DPT  [phone]    Jacqulyn Liner Sloan Leiter 07/18/2016, 10:21 AM

## 2016-07-18 NOTE — Progress Notes (Signed)
Pharmacy Antibiotic Note  Barry FRIEDT Sr. is a 83 y.o. male admitted on 07/14/2016 with fever/sepsis. He is on vancomycin for MSSA bacteremia d/t penicillin allergy, D#3 since BCx cleared. Afeb, WBC wnl, SCr 0.94, CrCl~50-60 ml/min. TTE done 5/8 - showed calcified AV. Brain MRI done 5/9 w/o obvious source of infxn. F/u plans for TEE. Not trialing ceph d/t allergy of swelling   Vancomycin trough = 19 tonight at high-end goal. Renal function has been stable.  Plan: Continue vancomycin 750 mg IV Q 12 hrs for now. Monitor renal function.  f/u endocarditis w/u and LOT May consider changing dose to 1500 mg IV Q 24 hrs if continues for > 7 days   Height: 5\' 11"  (180.3 cm) Weight: 170 lb (77.1 kg) IBW/kg (Calculated) : 75.3  Temp (24hrs), Avg:98.6 F (37 C), Min:98 F (36.7 C), Max:99 F (37.2 C)   Recent Labs Lab 07/14/16 2112 07/15/16 0023 07/15/16 0121 07/15/16 0409 07/16/16 0556 07/17/16 0450 07/18/16 0424 07/18/16 2121  WBC  --   --  20.1* 20.8* 13.8* 11.4* 9.6  --   CREATININE  --   --  1.10 1.09 1.09 1.00 0.94  --   LATICACIDVEN 2.34* 2.05* 1.8 1.9  --   --   --   --   VANCOTROUGH  --   --   --   --   --   --   --  19    Estimated Creatinine Clearance: 65.6 mL/min (by C-G formula based on SCr of 0.94 mg/dL).    Allergies  Allergen Reactions  . Penicillins Swelling    Eyes and lips Has patient had a PCN reaction causing immediate rash, facial/tongue/throat swelling, SOB or lightheadedness with hypotension: Yes Has patient had a PCN reaction causing severe rash involving mucus membranes or skin necrosis: No Has patient had a PCN reaction that required hospitalization No Has patient had a PCN reaction occurring within the last 10 years: No If all of the above answers are "NO", then may proceed with Cephalosporin use   . Prednisone Other (See Comments)    Hyperglycemia and AMS    Antimicrobials this admission: Levaquin 5/6 x 1>>  Aztreonam  5/6 >> 5/7 Vanc 5/6  >>  Dose adjustments this admission: n/a  Microbiology results: 5/6 BCx x2: MSSA (BCID MSSA) 5/7 BCx x 1: ngtd 5/8 BCx x 1: ngtd   Thank you for allowing pharmacy to be a part of this patient's care.  Maryanna Shape, PharmD, BCPS  Clinical Pharmacist  Pager: [phone]  07/18/2016 10:22 PM

## 2016-07-18 NOTE — Progress Notes (Signed)
INFECTIOUS DISEASE PROGRESS NOTE  ID: Barry Odor Funaro Sr. is a 83 y.o. male with  Principal Problem:   Fever Active Problems:   Type II diabetes mellitus, uncontrolled (HCC)   Mitral regurgitation and aortic stenosis   CKD (chronic kidney disease), stage III   S/P CABG (coronary artery bypass graft)   Persistent atrial fibrillation (HCC)   Dementia   Bacteremia  Subjective: Feels well today. Sitting up in the chair. Says his breathing is better, eating well. Denies fevers or chills.  Frustrated that he has not had TEE  Abtx:  Anti-infectives    Start     Dose/Rate Route Frequency Ordered Stop   07/15/16 2200  levofloxacin (LEVAQUIN) IVPB 750 mg  Status:  Discontinued     750 mg 100 mL/hr over 90 Minutes Intravenous Daily at bedtime 07/15/16 0434 07/15/16 0929   07/15/16 0900  vancomycin (VANCOCIN) IVPB 750 mg/150 ml premix     750 mg 150 mL/hr over 60 Minutes Intravenous Every 12 hours 07/15/16 0434     07/15/16 0800  aztreonam (AZACTAM) 2 g in dextrose 5 % 50 mL IVPB  Status:  Discontinued     2 g 100 mL/hr over 30 Minutes Intravenous Every 8 hours 07/15/16 0434 07/15/16 1653   07/14/16 2115  levofloxacin (LEVAQUIN) IVPB 750 mg     750 mg 100 mL/hr over 90 Minutes Intravenous  Once 07/14/16 2103 07/14/16 2251   07/14/16 2115  aztreonam (AZACTAM) 2 g in dextrose 5 % 50 mL IVPB     2 g 100 mL/hr over 30 Minutes Intravenous  Once 07/14/16 2103 07/14/16 2320   07/14/16 2115  vancomycin (VANCOCIN) IVPB 1000 mg/200 mL premix     1,000 mg 200 mL/hr over 60 Minutes Intravenous  Once 07/14/16 2103 07/14/16 2245      Medications: I have reviewed the patient's current medications.  Objective: Vital signs in last 24 hours: Temp:  [98 F (36.7 C)-99 F (37.2 C)] 99 F (37.2 C) (05/10 0449) Pulse Rate:  [51-88] 51 (05/10 0449) Resp:  [17-18] 17 (05/10 0449) BP: (148-157)/(52-78) 157/73 (05/10 0449) SpO2:  [95 %-99 %] 95 % (05/10 0639) Weight:  [160 lb (72.6 kg)] 160 lb (72.6  kg) (05/09 2155)  General: sitting up in the chair, no acute distress Cardiac: irr irr Pulm: coarse upper airway transmitted sounds, otherwise CTA Abd: soft, nondistended Ext: warm and well perfused, no pedal edema    Lab Results  Recent Labs  07/17/16 0450 07/18/16 0424  WBC 11.4* 9.6  HGB 10.5* 10.8*  HCT 33.1* 33.5*  NA 136 139  K 3.8 4.0  CL 106 107  CO2 23 22  BUN 13 13  CREATININE 1.00 0.94   Liver Panel No results for input(s): PROT, ALBUMIN, AST, ALT, ALKPHOS, BILITOT, BILIDIR, IBILI in the last 72 hours. Sedimentation Rate No results for input(s): ESRSEDRATE in the last 72 hours. C-Reactive Protein No results for input(s): CRP in the last 72 hours.  Microbiology: Recent Results (from the past 240 hour(s))  Culture, blood (Routine x 2)     Status: Abnormal   Collection Time: 07/14/16  9:00 PM  Result Value Ref Range Status   Specimen Description BLOOD LEFT ARM  Final   Special Requests   Final    BOTTLES DRAWN AEROBIC AND ANAEROBIC Blood Culture adequate volume   Culture  Setup Time   Final    GRAM POSITIVE COCCI IN CLUSTERS AEROBIC BOTTLE ONLY CRITICAL RESULT CALLED TO, READ BACK  BY AND VERIFIED WITH: N. Batchelder Pharm.D. 14:15 07/15/16 ([email or web address])    Culture STAPHYLOCOCCUS AUREUS (A)  Final   Report Status 07/17/2016 FINAL  Final   Organism ID, Bacteria STAPHYLOCOCCUS AUREUS  Final      Susceptibility   Staphylococcus aureus - MIC*    CIPROFLOXACIN <=0.5 SENSITIVE Sensitive     ERYTHROMYCIN <=0.25 SENSITIVE Sensitive     GENTAMICIN <=0.5 SENSITIVE Sensitive     OXACILLIN <=0.25 SENSITIVE Sensitive     TETRACYCLINE <=1 SENSITIVE Sensitive     VANCOMYCIN <=0.5 SENSITIVE Sensitive     TRIMETH/SULFA <=10 SENSITIVE Sensitive     CLINDAMYCIN <=0.25 SENSITIVE Sensitive     RIFAMPIN <=0.5 SENSITIVE Sensitive     Inducible Clindamycin NEGATIVE Sensitive     * STAPHYLOCOCCUS AUREUS  Blood Culture ID Panel (Reflexed)     Status: Abnormal   Collection Time:  07/14/16  9:00 PM  Result Value Ref Range Status   Enterococcus species NOT DETECTED NOT DETECTED Final   Listeria monocytogenes NOT DETECTED NOT DETECTED Final   Staphylococcus species DETECTED (A) NOT DETECTED Final    Comment: CRITICAL RESULT CALLED TO, READ BACK BY AND VERIFIED WITH: N. Batchelder Pharm.D. 14:15 07/15/16 ([email or web address])    Staphylococcus aureus DETECTED (A) NOT DETECTED Final    Comment: Methicillin (oxacillin) susceptible Staphylococcus aureus (MSSA). Preferred therapy is anti staphylococcal beta lactam antibiotic (Cefazolin or Nafcillin), unless clinically contraindicated. CRITICAL RESULT CALLED TO, READ BACK BY AND VERIFIED WITH: N. Batchelder Pharm.D. 14:15 07/15/16 ([email or web address])    Methicillin resistance NOT DETECTED NOT DETECTED Final   Streptococcus species NOT DETECTED NOT DETECTED Final   Streptococcus agalactiae NOT DETECTED NOT DETECTED Final   Streptococcus pneumoniae NOT DETECTED NOT DETECTED Final   Streptococcus pyogenes NOT DETECTED NOT DETECTED Final   Acinetobacter baumannii NOT DETECTED NOT DETECTED Final   Enterobacteriaceae species NOT DETECTED NOT DETECTED Final   Enterobacter cloacae complex NOT DETECTED NOT DETECTED Final   Escherichia coli NOT DETECTED NOT DETECTED Final   Klebsiella oxytoca NOT DETECTED NOT DETECTED Final   Klebsiella pneumoniae NOT DETECTED NOT DETECTED Final   Proteus species NOT DETECTED NOT DETECTED Final   Serratia marcescens NOT DETECTED NOT DETECTED Final   Haemophilus influenzae NOT DETECTED NOT DETECTED Final   Neisseria meningitidis NOT DETECTED NOT DETECTED Final   Pseudomonas aeruginosa NOT DETECTED NOT DETECTED Final   Candida albicans NOT DETECTED NOT DETECTED Final   Candida glabrata NOT DETECTED NOT DETECTED Final   Candida krusei NOT DETECTED NOT DETECTED Final   Candida parapsilosis NOT DETECTED NOT DETECTED Final   Candida tropicalis NOT DETECTED NOT DETECTED Final  Culture, blood (Routine x 2)     Status: None  (Preliminary result)   Collection Time: 07/14/16  9:13 PM  Result Value Ref Range Status   Specimen Description BLOOD RIGHT ARM  Final   Special Requests   Final    BOTTLES DRAWN AEROBIC AND ANAEROBIC Blood Culture adequate volume   Culture NO GROWTH 3 DAYS  Final   Report Status PENDING  Incomplete  Culture, blood (single) w Reflex to ID Panel     Status: None (Preliminary result)   Collection Time: 07/15/16  2:54 PM  Result Value Ref Range Status   Specimen Description BLOOD BLOOD LEFT ARM  Final   Special Requests IN PEDIATRIC BOTTLE Blood Culture adequate volume  Final   Culture NO GROWTH 2 DAYS  Final   Report Status PENDING  Incomplete  Culture, blood (single)  Status: None (Preliminary result)   Collection Time: 07/16/16  5:56 AM  Result Value Ref Range Status   Specimen Description BLOOD RIGHT ANTECUBITAL  Final   Special Requests   Final    BOTTLES DRAWN AEROBIC ONLY Blood Culture adequate volume   Culture NO GROWTH 1 DAY  Final   Report Status PENDING  Incomplete    Studies/Results: Mr Jeri Cos Wo Contrast  Result Date: 07/17/2016 CLINICAL DATA:  83 year old male with headache, neck pain, recent bacteremia (MSSA) and fever. EXAM: MRI HEAD WITHOUT AND WITH CONTRAST TECHNIQUE: Multiplanar, multiecho pulse sequences of the brain and surrounding structures were obtained without and with intravenous contrast. CONTRAST:  47mL MULTIHANCE GADOBENATE DIMEGLUMINE 529 MG/ML IV SOLN COMPARISON:  Brain MRI 01/17/2015 FINDINGS: Brain: Cerebral volume is not significantly changed since 2016. No restricted diffusion to suggest acute infarction. No midline shift, mass effect, evidence of mass lesion, ventriculomegaly, extra-axial collection or acute intracranial hemorrhage. Cervicomedullary junction and pituitary are within normal limits. Mild cavum septum pellucidum (normal variant). Patchy and confluent chronic bilateral cerebral white matter T2 and FLAIR hyperintensity. Moderate T2  heterogeneity in the bilateral deep gray matter nuclei. Evidence of a chronic microhemorrhage in the left parietal lobe which is new since 2016 (series 8, image 16). No other chronic cerebral blood products or new signal abnormality. No cortical edema or encephalomalacia identified. The brainstem and cerebellum remain normal for age. No abnormal enhancement identified. No dural thickening. 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 Skull and upper cervical spine: Negative. Bone marrow signal remains normal. Sinuses/Orbits: Stable and negative. Other: Mastoids remain clear. Visible internal auditory structures appear normal. Negative scalp soft tissues. IMPRESSION: 1.  No acute intracranial abnormality. 2. Largely stable MRI appearance of the brain since 2016. Advanced chronic signal changes in the bilateral cerebral white matter and deep gray matter most compatible with chronic small vessel disease. A solitary chronic microhemorrhage in the left parietal lobe is new since the prior MRI. Electronically Signed   By: Genevie Ann M.D.   On: 07/17/2016 09:54     Assessment/Plan: MSSA Bacteremia CAD s/p CABG HTN DM Atrial Fibrillation  MSSA Bacteremia: Without clear source of infection. Antibiotics have been narrowed to Vancomycin alone. He has a reported allergy to penicillin which involves swelling of his face and throat. He has not been trialed on cephalosporins as far as we can see in our system.  - Continue Vancomycin alone given Penicillin allergy - MRI Brain (-) - f/u blood cultures for clearance - Awaiting TEE to determine duration of antibiotics  BCx 5/6 >> 1/2 MSSA BCx 5/7 >> No Growth 3 days BCx 5/8 >> No Growth 2 day   Total days of antibiotics: 4 Levofloxacin (5/6), Aztreonam (5/6-5/7) --> Vancomycin 5/6 >>         Zada Finders, MD Internal Medicine Resident  [email or web address] 07/18/2016, 9:28 AM  LOS: 4 days    Attending Pt seen, discussed with h/o.  Await TEE to determine length of therapy.

## 2016-07-19 ENCOUNTER — Other Ambulatory Visit: Payer: Self-pay | Admitting: Physician Assistant

## 2016-07-19 ENCOUNTER — Inpatient Hospital Stay (HOSPITAL_COMMUNITY): Payer: Medicare Other

## 2016-07-19 DIAGNOSIS — B9561 Methicillin susceptible Staphylococcus aureus infection as the cause of diseases classified elsewhere: Secondary | ICD-10-CM

## 2016-07-19 DIAGNOSIS — R7881 Bacteremia: Secondary | ICD-10-CM

## 2016-07-19 LAB — PROCALCITONIN: Procalcitonin: <0.1 ng/mL

## 2016-07-19 LAB — BASIC METABOLIC PANEL
ANION GAP: 8 (ref 5–15)
BUN: 16 mg/dL (ref 6–20)
CHLORIDE: 103 mmol/L (ref 101–111)
CO2: 23 mmol/L (ref 22–32)
CREATININE: 1.1 mg/dL (ref 0.61–1.24)
Calcium: 8.3 mg/dL — ABNORMAL LOW (ref 8.9–10.3)
GFR calc Af Amer: >60 mL/min (ref >60)
GFR calc non Af Amer: >60 mL/min (ref >60)
Glucose, Bld: 221 mg/dL — ABNORMAL HIGH (ref 65–99)
POTASSIUM: 3.4 mmol/L — AB (ref 3.5–5.1)
SODIUM: 134 mmol/L — AB (ref 135–145)

## 2016-07-19 LAB — CBC
HEMATOCRIT: 33.1 % — AB (ref 39.0–52.0)
HEMOGLOBIN: 10.9 g/dL — AB (ref 13.0–17.0)
MCH: 28.8 pg (ref 26.0–34.0)
MCHC: 32.9 g/dL (ref 30.0–36.0)
MCV: 87.6 fL (ref 78.0–100.0)
PLATELETS: 152 10*3/uL (ref 150–400)
RBC: 3.78 MIL/uL — AB (ref 4.22–5.81)
RDW: 15.3 % (ref 11.5–15.5)
WBC: 7.6 10*3/uL (ref 4.0–10.5)

## 2016-07-19 LAB — GLUCOSE, CAPILLARY
GLUCOSE-CAPILLARY: 186 mg/dL — AB (ref 65–99)
Glucose-Capillary: 203 mg/dL — ABNORMAL HIGH (ref 65–99)
Glucose-Capillary: 147 mg/dL — ABNORMAL HIGH (ref 65–99)

## 2016-07-19 LAB — CULTURE, BLOOD (ROUTINE X 2)
Culture: NO GROWTH
Special Requests: ADEQUATE

## 2016-07-19 MED ORDER — VANCOMYCIN IV (FOR PTA / DISCHARGE USE ONLY): 750.0000 mg | Freq: Two times a day (BID) | INTRAVENOUS | 0 refills | Status: DC

## 2016-07-19 MED ORDER — HUMALOG MIX 75/25 (75-25) 100 UNIT/ML ~~LOC~~ SUSP: 10.0000 [IU] | Freq: Two times a day (BID) | SUBCUTANEOUS | 1 refills | Status: DC

## 2016-07-19 MED ORDER — VANCOMYCIN HCL IN DEXTROSE 750-5 MG/150ML-% IV SOLN: 750.0000 mg | Freq: Two times a day (BID) | INTRAVENOUS | 0 refills | Status: DC

## 2016-07-19 MED ORDER — SODIUM CHLORIDE 0.9% FLUSH
10.0000 mL | INTRAVENOUS | Status: DC | PRN
  Administered 2016-07-19: 10 mL
  Filled 2016-07-19: qty 40

## 2016-07-19 MED ORDER — SODIUM CHLORIDE 0.9 % IV SOLN: INTRAVENOUS | Status: DC

## 2016-07-19 MED ORDER — VANCOMYCIN IV (FOR PTA / DISCHARGE USE ONLY): 750.0000 mg | Freq: Two times a day (BID) | INTRAVENOUS | 0 refills | Status: AC

## 2016-07-19 MED ORDER — HEPARIN SOD (PORK) LOCK FLUSH 100 UNIT/ML IV SOLN
250.0000 [IU] | INTRAVENOUS | Status: AC | PRN
  Administered 2016-07-19: 250 [IU]

## 2016-07-19 NOTE — Progress Notes (Signed)
PHARMACY CONSULT NOTE FOR:  OUTPATIENT  PARENTERAL ANTIBIOTIC THERAPY (OPAT)  Indication: MSSA bacteremia with penicillin allergy Regimen: vancomycin 750mg  IV q12h End date: 08/02/16- may be continued beyond that day depending on what outpatient TEE shows. Drs. Charlies Silvers and Johnnye Sima have discussed this plan.  IV antibiotic discharge orders are pended. To discharging provider:  please sign these orders via discharge navigator,  Select New Orders & click on the button choice - Manage This Unsigned Work.     Thank you for allowing pharmacy to be a part of this patient's care.  Eiliyah Reh 07/19/2016, 9:38 AM

## 2016-07-19 NOTE — Progress Notes (Signed)
Discussed pt discharge information with pt wife. Discussed new medications and change and stopped medications. Discussed follow up appointments with wife and home health. Wife verbalized understanding of information

## 2016-07-19 NOTE — Progress Notes (Signed)
Peripherally Inserted Central Catheter/Midline Placement  The IV Nurse has discussed with the patient and/or persons authorized to consent for the patient, the purpose of this procedure and the potential benefits and risks involved with this procedure.  The benefits include less needle sticks, lab draws from the catheter, and the patient may be discharged home with the catheter. Risks include, but not limited to, infection, bleeding, blood clot (thrombus formation), and puncture of an artery; nerve damage and irregular heartbeat and possibility to perform a PICC exchange if needed/ordered by physician.  Alternatives to this procedure were also discussed.  Bard Power PICC patient education guide, fact sheet on infection prevention and patient information card has been provided to patient /or left at bedside.    PICC/Midline Placement Documentation  PICC Single Lumen 07/19/16 PICC Right Brachial 42 cm (Active)  Indication for Insertion or Continuance of Line Home intravenous therapies (PICC only) 07/19/2016  4:00 PM  Exposed Catheter (cm) 0 cm 07/19/2016  4:00 PM  Dressing Change Due 07/26/16 07/19/2016  4:00 PM       Jule Economy Horton 07/19/2016, 4:14 PM

## 2016-07-19 NOTE — Discharge Summary (Addendum)
Physician Discharge Summary  Barry MURRI Sr. XIP:[id number] DOB: 13-Dec-1932 DOA: 07/14/2016  PCP: [name], MD  Admit date: 07/14/2016 Discharge date: 07/19/2016  Recommendations for Outpatient Follow-up:   Please continue vancomycin for total of 2 weeks on discharge starting from today 07/19/2016. Your TEE is scheduled on May 22, information provided on discharge summary. Depending on the results of TEE it will be determined whether patient needs a longer course of antibiotics that 2 weeks. Please be sure that Dr. Johnnye Sima of ID is aware of the results of TEE.  Would hold metformin considering acute infection. Also, please note that we changed Humalog dose to 10 units twice a day. If you're eating regular meals a you may resume your regular Humalog dose.    Discharge Diagnoses:  Principal Problem:   Fever Active Problems:   Type II diabetes mellitus, uncontrolled (HCC)   Mitral regurgitation and aortic stenosis   CKD (chronic kidney disease), stage III   S/P CABG (coronary artery bypass graft)   Persistent atrial fibrillation (HCC)   Dementia   Bacteremia    Discharge Condition: stable   Diet recommendation: as tolerated   History of present illness:  83 year old male with past medical history significant for dementia, CAD s/p CABG in 11/2015, atrial fibrillation on Eliquis, diabetes mellitus, prostate cancer who presented to [facility] with fevers, malaise and chest pain. Patient also reported cough productive of white sputum. He was started on treatment for suspected community-acquired pneumonia. Blood cultures from 07/14/2016 are growing MSSA. ID following.   Hospital Course:  Sepsis secondary to MSSA bacteremia / Leukocytosis / Left lung base pneumonia  - Sepsis criteria met on admission with fever, leukocytosis and tachypnea - Source of infection is MSSA bacteremia from blood cx on 5/6 - Pt also has had evidence of penumonia on CXR (left lung base) CXR from 5/11 (current  abx will cover for pneumonia) - Repeat blood cx showed no growth  - No vegetation on ECHO - Plan is to continue vancomycin for total of 2 weeks, had TEE on outpatient basis 07/30/2016, already scheduled by cardiology. He will be then determine if patient requires a longer treatment with vancomycin than 2 weeks.  Persistent headaches - MRI without acute abnormalities   Controlled type 2 diabetes mellitus - A1c 6.8  In 03/2016 - Continue humalog but at a reduced dosage 10 units BID unless pt eating full meals then he can resume home dose humalog - Hold metformin due to acute infection   Chronic systolic CHF - ECHO showed EF 55% - Stable   Chronic atrial fibrillation - CHADS vasc score 6 - Continue apixaban and aspirin  Thrombocytopenia - Stable   Iron deficiency anemia - Continue ferrous sulfate supplementation   Essential hypertension - Continue home meds  Dyslipidemia associated with type 2 diabetes mellitus - Continue statin therapy   Hypothyroidism - Continue synthroid   Mild troponin elevation - Mild, likely demand ischemia from sepsis   DVT prophylaxis: On Apixaban Code Status: full code  Family Communication: wife at bedside     Consultants:   ID  Procedures:   2 D ECHO 5/8 - EF 55%, LVH  Antimicrobials:   Aztreonam 07/14/2016 -->07/15/2016   Levaquin 07/14/2016 --> 07/15/2016  Vancomycin 07/14/2016 --> for 2 weeks on discharge    Signed:  Leisa Lenz, MD  [facility] 07/19/2016, 10:48 AM  Pager #: [phone]  Time spent in minutes: less than 30 minutes   Discharge Exam: Vitals:   07/19/16 0526  07/19/16 0925  BP: (!) 153/83 (!) 148/70  Pulse: 71 66  Resp: 18 17  Temp: 97.5 F (36.4 C) 98.6 F (37 C)   Vitals:   07/18/16 2001 07/19/16 0500 07/19/16 0526 07/19/16 0925  BP: 121/66  (!) 153/83 (!) 148/70  Pulse: (!) 52  71 66  Resp: '18  18 17  '$ Temp: 98.7 F (37.1 C)  97.5 F (36.4 C) 98.6 F (37 C)  TempSrc:  Oral  Oral Oral  SpO2: 97%  97% 96%  Weight: 77.1 kg (170 lb) 77.1 kg (169 lb 15.6 oz)    Height:        General: Pt is alert, follows commands appropriately, not in acute distress Cardiovascular: Regular rate and rhythm, S1/S2 +, no murmurs Respiratory: Clear to auscultation bilaterally, no wheezing, no crackles, no rhonchi Abdominal: Soft, non tender, non distended, bowel sounds +, no guarding Extremities: no edema, no cyanosis, pulses palpable bilaterally DP and PT Neuro: Grossly nonfocal  Discharge Instructions  Discharge Instructions    Call MD for:  persistant nausea and vomiting    Complete by:  As directed    Call MD for:  redness, tenderness, or signs of infection (pain, swelling, redness, odor or green/yellow discharge around incision site)    Complete by:  As directed    Call MD for:  severe uncontrolled pain    Complete by:  As directed    Diet - low sodium heart healthy    Complete by:  As directed    Discharge instructions    Complete by:  As directed    Please continue vancomycin for total of 2 weeks on discharge starting from today 07/19/2016. Your TEE is scheduled on May 22, information provided on discharge summary. Depending on the results of TEE it will be determined whether patient needs a longer course of antibiotics that 2 weeks. Please be sure that Dr. Johnnye Sima of ID is aware of the results of TEE.  Would hold metformin considering acute infection. Also, please note that we changed Humalog dose to 10 units twice a day. If you're eating regular meals a you may resume your regular Humalog dose.   Home infusion instructions [facility] May follow [facility] Dosing Protocol; May administer Cathflo as needed to maintain patency of vascular access device.; Flushing of vascular access device: per [facility] Protocol: 0.9% NaCl pre/post medica...    Complete by:  As directed    Instructions:  May follow [facility] Dosing Protocol   Instructions:  May administer  Cathflo as needed to maintain patency of vascular access device.   Instructions:  Flushing of vascular access device: per [facility] Protocol: 0.9% NaCl pre/post medication administration and prn patency; Heparin 100 u/ml, 48m for implanted ports and Heparin 10u/ml, 569mfor all other central venous catheters.   Instructions:  May follow [facility] Anaphylaxis Protocol for First Dose Administration in the home: 0.9% NaCl at 25-50 ml/hr to maintain IV access for protocol meds. Epinephrine 0.3 ml IV/IM PRN and Benadryl 25-50 IV/IM PRN s/s of anaphylaxis.   Instructions:  [facility] Coord[facility] (RN) to assist per patient IV care needs in the home PRN.   Increase activity slowly    Complete by:  As directed      Allergies as of 07/19/2016      Reactions   Penicillins Swelling   Eyes and lips Has patient had a PCN reaction causing immediate rash, facial/tongue/throat swelling, SOB or lightheadedness with hypotension: Yes Has patient had  a PCN reaction causing severe rash involving mucus membranes or skin necrosis: No Has patient had a PCN reaction that required hospitalization No Has patient had a PCN reaction occurring within the last 10 years: No If all of the above answers are "NO", then may proceed with Cephalosporin use   Prednisone Other (See Comments)   Hyperglycemia and AMS      Medication List    STOP taking these medications   metFORMIN 500 MG tablet Commonly known as:  GLUCOPHAGE     TAKE these medications   acetaminophen 500 MG tablet Commonly known as:  TYLENOL Take 500 mg by mouth every 8 (eight) hours as needed for mild pain, moderate pain or headache.   albuterol 108 (90 Base) MCG/ACT inhaler Commonly known as:  PROVENTIL HFA;VENTOLIN HFA Inhale 2 puffs into the lungs every 6 (six) hours. What changed:  when to take this  reasons to take this   apixaban 5 MG Tabs tablet Commonly known as:  ELIQUIS Take 1 tablet (5 mg total) by mouth 2 (two) times daily.    aspirin EC 81 MG tablet Take 81 mg by mouth daily. What changed:  Another medication with the same name was removed. Continue taking this medication, and follow the directions you see here.   budesonide-formoterol 160-4.5 MCG/ACT inhaler Commonly known as:  SYMBICORT Inhale 2 puffs into the lungs 2 (two) times daily.   ferrous sulfate 325 (65 FE) MG EC tablet Take 325 mg by mouth daily with supper.   finasteride 5 MG tablet Commonly known as:  PROSCAR Take 5 mg by mouth daily.   gabapentin 300 MG capsule Commonly known as:  NEURONTIN TAKE ONE CAPSULE BY MOUTH 4 TIMES DAILY What changed:  See the new instructions.   HUMALOG MIX 75/25 (75-25) 100 UNIT/ML Susp injection Generic drug:  insulin lispro protamine-lispro Inject 10 Units into the skin 2 (two) times daily with a meal. Take 30 units subcutaneously daily with breakfast and 26 units with supper What changed:  how much to take  when to take this   levothyroxine 112 MCG tablet Commonly known as:  SYNTHROID, LEVOTHROID TAKE ONE TABLET BY MOUTH ONCE DAILY   lisinopril 20 MG tablet Commonly known as:  PRINIVIL,ZESTRIL Take 20 mg by mouth daily.   menthol-thymol Liqd Apply 1 application topically 3 (three) times daily as needed (pain). For pain.   NASACORT AQ 55 MCG/ACT Aero nasal inhaler Generic drug:  triamcinolone Place 2 sprays into the nose daily.   nitroGLYCERIN 0.4 MG SL tablet Commonly known as:  NITROSTAT PLACE ONE TABLET UNDER THE TONGUE EVERY 5 MINUTES AS NEEDED FOR CHEST PAIN   oxyCODONE-acetaminophen 5-325 MG tablet Commonly known as:  ROXICET Take 1 tablet by mouth every 8 (eight) hours as needed for severe pain.   polyethylene glycol packet Commonly known as:  MIRALAX / GLYCOLAX Take 17 g by mouth daily as needed (constipation). Mix in 8 oz liquid and drink   rosuvastatin 10 MG tablet Commonly known as:  CRESTOR Take 1 tablet (10 mg total) by mouth daily.   SYSTANE 0.4-0.3 % Soln Generic  drug:  Polyethyl Glycol-Propyl Glycol Apply 1-2 drops to eye 3 (three) times daily as needed (for dry eyes).   vancomycin IVPB Inject 750 mg into the vein every 12 (twelve) hours. Indication:  bacteremia Last Day of Therapy:  5/25 Labs - Sunday/Monday:  CBC/D, BMP, and vancomycin trough. Labs - Thursday:  BMP and vancomycin trough Labs - Every other week:  ESR  and CRP   vancomycin IVPB Inject 750 mg into the vein every 12 (twelve) hours. Indication:  MSSA bacteremia with PCN allergy Last Day of Therapy:  07/27/16 Labs - 'Sunday/Monday:  CBC/D, BMP, and vancomycin trough. Labs - Thursday:  BMP and vancomycin trough Labs - Every other week:  ESR and CRP Start taking on:  07/20/2016   vitamin B-12 1000 MCG tablet Commonly known as:  CYANOCOBALAMIN Take 1,000 mcg by mouth daily.            Home Infusion Instuctions        Start     Ordered   07/19/16 0000  Home infusion instructions [facility] May follow [facility] Dosing Protocol; May administer Cathflo as needed to maintain patency of vascular access device.; Flushing of vascular access device: per [facility] Protocol: 0.9% NaCl pre/post medica...    Question Answer Comment  Instructions May follow [facility] Dosing Protocol   Instructions May administer Cathflo as needed to maintain patency of vascular access device.   Instructions Flushing of vascular access device: per [facility] Protocol: 0.9% NaCl pre/post medication administration and prn patency; Heparin 100 u/ml, 5ml for implanted ports and Heparin 10u/ml, 5ml for all other central venous catheters.   Instructions May follow [facility] Anaphylaxis Protocol for First Dose Administration in the home: 0.9% NaCl at 25-50 ml/hr to maintain IV access for protocol meds. Epinephrine 0.3 ml IV/IM PRN and Benadryl 25-50 IV/IM PRN s/s of anaphylaxis.   Instructions [facility] Infusion Coordinator (RN) to assist per patient IV care needs in the home PRN.      07/19/16 1046   07/19/16 0000   Home infusion instructions [facility] May follow [facility] Dosing Protocol; May administer Cathflo as needed to maintain patency of vascular access device.; Flushing of vascular access device: per [facility] Protocol: 0.9% NaCl pre/post medica...    Question Answer Comment  Instructions May follow [facility] Dosing Protocol   Instructions May administer Cathflo as needed to maintain patency of vascular access device.   Instructions Flushing of vascular access device: per [facility] Protocol: 0.9% NaCl pre/post medication administration and prn patency; Heparin 100 u/ml, 5ml for implanted ports and Heparin 10u/ml, 5ml for all other central venous catheters.   Instructions May follow [facility] Anaphylaxis Protocol for First Dose Administration in the home: 0.9% NaCl at 25-50 ml/hr to maintain IV access for protocol meds. Epinephrine 0.3 ml IV/IM PRN and Benadryl 25-50 IV/IM PRN s/s of anaphylaxis.   Instructions [facility] Infusion Coordinator (RN) to assist per patient IV care needs in the home PRN.      05'$ /11/18 1805      Follow-up [place]. Go on 07/30/2016.   Why:  please show up to [facility] at 6:45 am for your TEE with Dr Oval Linsey. Nothing to eat after midnight the night before. you can take you AM pills with a small sip of water Contact information: [place] [address] [phone]       Campbell Riches, MD. Schedule an appointment as soon as possible for a visit.   Specialty:  Infectious Diseases Why:  make an appt after TEE Contact information: [address] [place] [address] [phone]            The results of significant diagnostics from this hospitalization (including imaging, microbiology, ancillary and laboratory) are listed below for reference.    Significant  Diagnostic Studies: Dg Chest 2 View  Result Date: 07/14/2016 CLINICAL DATA:  Cough and mental status  changes.  Fever. EXAM: CHEST  2 VIEW COMPARISON:  01/04/2016 FINDINGS: Prior median sternotomy. Apical lordotic frontal radiographs. Midline trachea. Moderate cardiomegaly. Atherosclerosis in the transverse aorta. No pleural effusion or pneumothorax. Hyperinflation. No congestive failure. No lobar consolidation. IMPRESSION: Cardiomegaly and hyperinflation, without acute disease. Electronically Signed   By: Abigail Miyamoto M.D.   On: 07/14/2016 21:55   Mr Jeri Cos ZO Contrast  Result Date: 07/17/2016 CLINICAL DATA:  83 year old male with headache, neck pain, recent bacteremia (MSSA) and fever. EXAM: MRI HEAD WITHOUT AND WITH CONTRAST TECHNIQUE: Multiplanar, multiecho pulse sequences of the brain and surrounding structures were obtained without and with intravenous contrast. CONTRAST:  10m MULTIHANCE GADOBENATE DIMEGLUMINE 529 MG/ML IV SOLN COMPARISON:  Brain MRI 01/17/2015 FINDINGS: Brain: Cerebral volume is not significantly changed since 2016. No restricted diffusion to suggest acute infarction. No midline shift, mass effect, evidence of mass lesion, ventriculomegaly, extra-axial collection or acute intracranial hemorrhage. Cervicomedullary junction and pituitary are within normal limits. Mild cavum septum pellucidum (normal variant). Patchy and confluent chronic bilateral cerebral white matter T2 and FLAIR hyperintensity. Moderate T2 heterogeneity in the bilateral deep gray matter nuclei. Evidence of a chronic microhemorrhage in the left parietal lobe which is new since 2016 (series 8, image 16). No other chronic cerebral blood products or new signal abnormality. No cortical edema or encephalomalacia identified. The brainstem and cerebellum remain normal for age. No abnormal enhancement identified. No dural thickening. 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 Skull and upper cervical spine: Negative. Bone  marrow signal remains normal. Sinuses/Orbits: Stable and negative. Other: Mastoids remain clear. Visible internal auditory structures appear normal. Negative scalp soft tissues. IMPRESSION: 1.  No acute intracranial abnormality. 2. Largely stable MRI appearance of the brain since 2016. Advanced chronic signal changes in the bilateral cerebral white matter and deep gray matter most compatible with chronic small vessel disease. A solitary chronic microhemorrhage in the left parietal lobe is new since the prior MRI. Electronically Signed   By: HGenevie AnnM.D.   On: 07/17/2016 09:54    Microbiology: Recent Results (from the past 240 hour(s))  Culture, blood (Routine x 2)     Status: Abnormal   Collection Time: 07/14/16  9:00 PM  Result Value Ref Range Status   Specimen Description BLOOD LEFT ARM  Final   Special Requests   Final    BOTTLES DRAWN AEROBIC AND ANAEROBIC Blood Culture adequate volume   Culture  Setup Time   Final    GRAM POSITIVE COCCI IN CLUSTERS AEROBIC BOTTLE ONLY CRITICAL RESULT CALLED TO, READ BACK BY AND VERIFIED WITH: N. Batchelder Pharm.D. 14:15 07/15/16 ([email or web address])    Culture STAPHYLOCOCCUS AUREUS (A)  Final   Report Status 07/17/2016 FINAL  Final   Organism ID, Bacteria STAPHYLOCOCCUS AUREUS  Final      Susceptibility   Staphylococcus aureus - MIC*    CIPROFLOXACIN <=0.5 SENSITIVE Sensitive     ERYTHROMYCIN <=0.25 SENSITIVE Sensitive     GENTAMICIN <=0.5 SENSITIVE Sensitive     OXACILLIN <=0.25 SENSITIVE Sensitive     TETRACYCLINE <=1 SENSITIVE Sensitive     VANCOMYCIN <=0.5 SENSITIVE Sensitive     TRIMETH/SULFA <=10 SENSITIVE Sensitive     CLINDAMYCIN <=0.25 SENSITIVE Sensitive     RIFAMPIN <=0.5 SENSITIVE Sensitive     Inducible Clindamycin NEGATIVE Sensitive     *  STAPHYLOCOCCUS AUREUS  Blood Culture ID Panel (Reflexed)     Status: Abnormal   Collection Time: 07/14/16  9:00 PM  Result Value Ref Range Status   Enterococcus species NOT DETECTED NOT DETECTED Final    Listeria monocytogenes NOT DETECTED NOT DETECTED Final   Staphylococcus species DETECTED (A) NOT DETECTED Final    Comment: CRITICAL RESULT CALLED TO, READ BACK BY AND VERIFIED WITH: N. Batchelder Pharm.D. 14:15 07/15/16 ([email or web address])    Staphylococcus aureus DETECTED (A) NOT DETECTED Final    Comment: Methicillin (oxacillin) susceptible Staphylococcus aureus (MSSA). Preferred therapy is anti staphylococcal beta lactam antibiotic (Cefazolin or Nafcillin), unless clinically contraindicated. CRITICAL RESULT CALLED TO, READ BACK BY AND VERIFIED WITH: N. Batchelder Pharm.D. 14:15 07/15/16 ([email or web address])    Methicillin resistance NOT DETECTED NOT DETECTED Final   Streptococcus species NOT DETECTED NOT DETECTED Final   Streptococcus agalactiae NOT DETECTED NOT DETECTED Final   Streptococcus pneumoniae NOT DETECTED NOT DETECTED Final   Streptococcus pyogenes NOT DETECTED NOT DETECTED Final   Acinetobacter baumannii NOT DETECTED NOT DETECTED Final   Enterobacteriaceae species NOT DETECTED NOT DETECTED Final   Enterobacter cloacae complex NOT DETECTED NOT DETECTED Final   Escherichia coli NOT DETECTED NOT DETECTED Final   Klebsiella oxytoca NOT DETECTED NOT DETECTED Final   Klebsiella pneumoniae NOT DETECTED NOT DETECTED Final   Proteus species NOT DETECTED NOT DETECTED Final   Serratia marcescens NOT DETECTED NOT DETECTED Final   Haemophilus influenzae NOT DETECTED NOT DETECTED Final   Neisseria meningitidis NOT DETECTED NOT DETECTED Final   Pseudomonas aeruginosa NOT DETECTED NOT DETECTED Final   Candida albicans NOT DETECTED NOT DETECTED Final   Candida glabrata NOT DETECTED NOT DETECTED Final   Candida krusei NOT DETECTED NOT DETECTED Final   Candida parapsilosis NOT DETECTED NOT DETECTED Final   Candida tropicalis NOT DETECTED NOT DETECTED Final  Culture, blood (Routine x 2)     Status: None   Collection Time: 07/14/16  9:13 PM  Result Value Ref Range Status   Specimen Description BLOOD RIGHT ARM   Final   Special Requests   Final    BOTTLES DRAWN AEROBIC AND ANAEROBIC Blood Culture adequate volume   Culture NO GROWTH 5 DAYS  Final   Report Status 07/19/2016 FINAL  Final  Culture, blood (single) w Reflex to ID Panel     Status: None (Preliminary result)   Collection Time: 07/15/16  2:54 PM  Result Value Ref Range Status   Specimen Description BLOOD BLOOD LEFT ARM  Final   Special Requests IN PEDIATRIC BOTTLE Blood Culture adequate volume  Final   Culture NO GROWTH 4 DAYS  Final   Report Status PENDING  Incomplete  Culture, blood (single)     Status: None (Preliminary result)   Collection Time: 07/16/16  5:56 AM  Result Value Ref Range Status   Specimen Description BLOOD RIGHT ANTECUBITAL  Final   Special Requests   Final    BOTTLES DRAWN AEROBIC ONLY Blood Culture adequate volume   Culture NO GROWTH 3 DAYS  Final   Report Status PENDING  Incomplete     Labs: Basic Metabolic Panel:  Recent Labs Lab 07/15/16 0409 07/16/16 0556 07/17/16 0450 07/18/16 0424 07/19/16 0630  NA 140 139 136 139 134*  K 4.4 4.0 3.8 4.0 3.4*  CL 114* 109 106 107 103  CO2 19* 21* '23 22 23  '$ GLUCOSE 149* 173* 207* 172* 221*  BUN '17 13 13 13 16  '$ CREATININE 1.09 1.09 1.00 0.94 1.10  CALCIUM 8.0* 8.6* 8.5* 8.4* 8.3*   Liver Function Tests:  Recent Labs Lab 07/14/16 2100 07/15/16 0121  AST 17 20  ALT 10* 15*  ALKPHOS 64 58  BILITOT 0.7 0.5  PROT 6.0* 5.5*  ALBUMIN 3.1* 2.9*   No results for input(s): LIPASE, AMYLASE in the last 168 hours. No results for input(s): AMMONIA in the last 168 hours. CBC:  Recent Labs Lab 07/14/16 2100 07/15/16 0121 07/15/16 0409 07/16/16 0556 07/17/16 0450 07/18/16 0424 07/19/16 0630  WBC 16.9* 20.1* 20.8* 13.8* 11.4* 9.6 7.6  NEUTROABS 15.2* 17.9*  --   --   --   --   --   HGB 11.3* 10.6* 10.6* 10.7* 10.5* 10.8* 10.9*  HCT 34.6* 33.9* 34.1* 34.4* 33.1* 33.5* 33.1*  MCV 88.3 88.5 88.3 88.4 87.8 87.2 87.6  PLT 124* 129* 127* 127* 132* 139* 152    Cardiac Enzymes:  Recent Labs Lab 07/15/16 1020  TROPONINI 0.05*   BNP: BNP (last 3 results)  Recent Labs  11/25/15 1833  BNP 1,139.9*    ProBNP (last 3 results) No results for input(s): PROBNP in the last 8760 hours.  CBG:  Recent Labs Lab 07/18/16 0817 07/18/16 1226 07/18/16 1727 07/18/16 2133 07/19/16 0747  GLUCAP 168* 215* 185* 173* 203*

## 2016-07-19 NOTE — Progress Notes (Signed)
INFECTIOUS DISEASE PROGRESS NOTE  ID: Barry Odor Saner Sr. is a 83 y.o. male with  Principal Problem:   Fever Active Problems:   Type II diabetes mellitus, uncontrolled (HCC)   Mitral regurgitation and aortic stenosis   CKD (chronic kidney disease), stage III   S/P CABG (coronary artery bypass graft)   Persistent atrial fibrillation (HCC)   Dementia   Bacteremia  Subjective: Feels well, frustrated he was not able to go home 1-2 days ago.  Abtx:  Anti-infectives    Start     Dose/Rate Route Frequency Ordered Stop   07/19/16 0000  vancomycin IVPB     750 mg Intravenous Every 12 hours 07/19/16 1046     07/19/16 0000  Vancomycin (VANCOCIN) 750-5 MG/150ML-% SOLN     750 mg 150 mL/hr over 60 Minutes Intravenous Every 12 hours 07/19/16 1046 08/02/16 2359   07/15/16 2200  levofloxacin (LEVAQUIN) IVPB 750 mg  Status:  Discontinued     750 mg 100 mL/hr over 90 Minutes Intravenous Daily at bedtime 07/15/16 0434 07/15/16 0929   07/15/16 0900  vancomycin (VANCOCIN) IVPB 750 mg/150 ml premix     750 mg 150 mL/hr over 60 Minutes Intravenous Every 12 hours 07/15/16 0434     07/15/16 0800  aztreonam (AZACTAM) 2 g in dextrose 5 % 50 mL IVPB  Status:  Discontinued     2 g 100 mL/hr over 30 Minutes Intravenous Every 8 hours 07/15/16 0434 07/15/16 1653   07/14/16 2115  levofloxacin (LEVAQUIN) IVPB 750 mg     750 mg 100 mL/hr over 90 Minutes Intravenous  Once 07/14/16 2103 07/14/16 2251   07/14/16 2115  aztreonam (AZACTAM) 2 g in dextrose 5 % 50 mL IVPB     2 g 100 mL/hr over 30 Minutes Intravenous  Once 07/14/16 2103 07/14/16 2320   07/14/16 2115  vancomycin (VANCOCIN) IVPB 1000 mg/200 mL premix     1,000 mg 200 mL/hr over 60 Minutes Intravenous  Once 07/14/16 2103 07/14/16 2245      Medications: I have reviewed the patient's current medications.  Objective: Vital signs in last 24 hours: Temp:  [97.5 F (36.4 C)-98.7 F (37.1 C)] 98.6 F (37 C) (05/11 0925) Pulse Rate:  [52-71] 66  (05/11 0925) Resp:  [17-18] 17 (05/11 0925) BP: (121-153)/(66-83) 148/70 (05/11 0925) SpO2:  [96 %-97 %] 96 % (05/11 0925) Weight:  [169 lb 15.6 oz (77.1 kg)-170 lb (77.1 kg)] 169 lb 15.6 oz (77.1 kg) (05/11 0500)  General: resting bed, no acute distress Ext: no pedal edema    Lab Results  Recent Labs  07/18/16 0424 07/19/16 0630  WBC 9.6 7.6  HGB 10.8* 10.9*  HCT 33.5* 33.1*  NA 139 134*  K 4.0 3.4*  CL 107 103  CO2 22 23  BUN 13 16  CREATININE 0.94 1.10   Liver Panel No results for input(s): PROT, ALBUMIN, AST, ALT, ALKPHOS, BILITOT, BILIDIR, IBILI in the last 72 hours. Sedimentation Rate No results for input(s): ESRSEDRATE in the last 72 hours. C-Reactive Protein No results for input(s): CRP in the last 72 hours.  Microbiology: Recent Results (from the past 240 hour(s))  Culture, blood (Routine x 2)     Status: Abnormal   Collection Time: 07/14/16  9:00 PM  Result Value Ref Range Status   Specimen Description BLOOD LEFT ARM  Final   Special Requests   Final    BOTTLES DRAWN AEROBIC AND ANAEROBIC Blood Culture adequate volume   Culture  Setup Time   Final    GRAM POSITIVE COCCI IN CLUSTERS AEROBIC BOTTLE ONLY CRITICAL RESULT CALLED TO, READ BACK BY AND VERIFIED WITH: N. Batchelder Pharm.D. 14:15 07/15/16 ([email or web address])    Culture STAPHYLOCOCCUS AUREUS (A)  Final   Report Status 07/17/2016 FINAL  Final   Organism ID, Bacteria STAPHYLOCOCCUS AUREUS  Final      Susceptibility   Staphylococcus aureus - MIC*    CIPROFLOXACIN <=0.5 SENSITIVE Sensitive     ERYTHROMYCIN <=0.25 SENSITIVE Sensitive     GENTAMICIN <=0.5 SENSITIVE Sensitive     OXACILLIN <=0.25 SENSITIVE Sensitive     TETRACYCLINE <=1 SENSITIVE Sensitive     VANCOMYCIN <=0.5 SENSITIVE Sensitive     TRIMETH/SULFA <=10 SENSITIVE Sensitive     CLINDAMYCIN <=0.25 SENSITIVE Sensitive     RIFAMPIN <=0.5 SENSITIVE Sensitive     Inducible Clindamycin NEGATIVE Sensitive     * STAPHYLOCOCCUS AUREUS  Blood Culture  ID Panel (Reflexed)     Status: Abnormal   Collection Time: 07/14/16  9:00 PM  Result Value Ref Range Status   Enterococcus species NOT DETECTED NOT DETECTED Final   Listeria monocytogenes NOT DETECTED NOT DETECTED Final   Staphylococcus species DETECTED (A) NOT DETECTED Final    Comment: CRITICAL RESULT CALLED TO, READ BACK BY AND VERIFIED WITH: N. Batchelder Pharm.D. 14:15 07/15/16 ([email or web address])    Staphylococcus aureus DETECTED (A) NOT DETECTED Final    Comment: Methicillin (oxacillin) susceptible Staphylococcus aureus (MSSA). Preferred therapy is anti staphylococcal beta lactam antibiotic (Cefazolin or Nafcillin), unless clinically contraindicated. CRITICAL RESULT CALLED TO, READ BACK BY AND VERIFIED WITH: N. Batchelder Pharm.D. 14:15 07/15/16 ([email or web address])    Methicillin resistance NOT DETECTED NOT DETECTED Final   Streptococcus species NOT DETECTED NOT DETECTED Final   Streptococcus agalactiae NOT DETECTED NOT DETECTED Final   Streptococcus pneumoniae NOT DETECTED NOT DETECTED Final   Streptococcus pyogenes NOT DETECTED NOT DETECTED Final   Acinetobacter baumannii NOT DETECTED NOT DETECTED Final   Enterobacteriaceae species NOT DETECTED NOT DETECTED Final   Enterobacter cloacae complex NOT DETECTED NOT DETECTED Final   Escherichia coli NOT DETECTED NOT DETECTED Final   Klebsiella oxytoca NOT DETECTED NOT DETECTED Final   Klebsiella pneumoniae NOT DETECTED NOT DETECTED Final   Proteus species NOT DETECTED NOT DETECTED Final   Serratia marcescens NOT DETECTED NOT DETECTED Final   Haemophilus influenzae NOT DETECTED NOT DETECTED Final   Neisseria meningitidis NOT DETECTED NOT DETECTED Final   Pseudomonas aeruginosa NOT DETECTED NOT DETECTED Final   Candida albicans NOT DETECTED NOT DETECTED Final   Candida glabrata NOT DETECTED NOT DETECTED Final   Candida krusei NOT DETECTED NOT DETECTED Final   Candida parapsilosis NOT DETECTED NOT DETECTED Final   Candida tropicalis NOT DETECTED NOT  DETECTED Final  Culture, blood (Routine x 2)     Status: None   Collection Time: 07/14/16  9:13 PM  Result Value Ref Range Status   Specimen Description BLOOD RIGHT ARM  Final   Special Requests   Final    BOTTLES DRAWN AEROBIC AND ANAEROBIC Blood Culture adequate volume   Culture NO GROWTH 5 DAYS  Final   Report Status 07/19/2016 FINAL  Final  Culture, blood (single) w Reflex to ID Panel     Status: None (Preliminary result)   Collection Time: 07/15/16  2:54 PM  Result Value Ref Range Status   Specimen Description BLOOD BLOOD LEFT ARM  Final   Special Requests IN PEDIATRIC BOTTLE Blood Culture adequate volume  Final   Culture  NO GROWTH 4 DAYS  Final   Report Status PENDING  Incomplete  Culture, blood (single)     Status: None (Preliminary result)   Collection Time: 07/16/16  5:56 AM  Result Value Ref Range Status   Specimen Description BLOOD RIGHT ANTECUBITAL  Final   Special Requests   Final    BOTTLES DRAWN AEROBIC ONLY Blood Culture adequate volume   Culture NO GROWTH 3 DAYS  Final   Report Status PENDING  Incomplete    Studies/Results: No results found.   Assessment/Plan: MSSA Bacteremia CAD s/p CABG HTN DM Atrial Fibrillation  MSSA Bacteremia: Without clear source of infection. He has a reported allergy to penicillin which involves swelling of his face and throat. He has not been trialed on cephalosporins as far as we can see in our system.  - Continue Vancomycin alone given Penicillin allergy - MRI Brain (-) - f/u blood cultures for clearance - TEE scheduled for 07/30/2016 - Pt to be discharged today per primary; PICC line to be placed will need at least 2 weeks Vancomycin (5/6 >> 5/19); if TEE showing vegetations will need 6 weeks total - Will need weekly CBC, CMP, ESR, CRP, Vanc trough while on IV Vanc  BCx 5/6 >> 1/2 MSSA BCx 5/7 >> No Growth 4 days BCx 5/8 >> No Growth 3 days   Total days of antibiotics: 5 Levofloxacin (5/6), Aztreonam (5/6-5/7) -->  Vancomycin 5/6 >>         Zada Finders, MD Internal Medicine Resident PGY-2 [email or web address] 07/19/2016, 11:23 AM  LOS: 5 days    Pt seen and discussed with h/o.  Has PIC Await his f/u TEE.

## 2016-07-19 NOTE — Progress Notes (Signed)
   I have arranged for an outpatient TEE on 07/30/16 with Dr. Oval Linsey. I have put instructions in the follow up portion of the chart. I discussed procedure with wife ( as husband has some dementia) and they are willing to proceed.    [facility] has been requested to perform a transesophageal echocardiogram on Barry D Tolen Sr. for bacteremia.  After careful review of history and examination, the risks and benefits of transesophageal echocardiogram have been explained including risks of esophageal damage, perforation (1:10,000 risk), bleeding, pharyngeal hematoma as well as other potential complications associated with conscious sedation including aspiration, arrhythmia, respiratory failure and death. Alternatives to treatment were discussed, questions were answered. Patient's wife is willing to proceed.   Angelena Form, PA-C  07/19/2016 10:22 AM

## 2016-07-19 NOTE — Progress Notes (Addendum)
PHARMACY CONSULT NOTE FOR:  OUTPATIENT  PARENTERAL ANTIBIOTIC THERAPY (OPAT)  Indication: MSSA bacteremia with penicillin allergy Regimen: Vancomycin 750mg  IV Q12H End date: changed to 07/27/16 unless TEE shows vegetation then will need 6 weeks total ([facility] aware)  IV antibiotic discharge orders are pended. To discharging provider:  please sign these orders via discharge navigator,  Select New Orders & click on the button choice - Manage This Unsigned Work.     Madaleine Simmon D. Mina Marble, PharmD, BCPS Pager:  [phone] 07/19/2016, 4:32 PM

## 2016-07-19 NOTE — Care Management Note (Addendum)
Case Management Note  Patient Details  Name: Barry KILNER Sr. MRN: [id number] Date of Birth: [birth year]  Subjective/Objective:    CM following for progression and d/c planning.                 Action/Plan: 07/20/2015 Met with pt and wife, re [facility] services and IV antibiotics. [facility] selected for [facility] services. This CM notiifed Carolynn Sayers RN, [facility] IV coordinator of need for IV antibiotics. Pam C will visit with pt and wife to discuss and educate re IV antibiotics. Await PICC at this time. No DME needs.   Expected Discharge Date:  07/19/16               Expected Discharge Plan:  [place]  In-House Referral:  NA  Discharge planning Services  CM Consult  Post Acute Care Choice:  Durable Medical Equipment, Home Health Choice offered to:  Patient  DME Arranged:  IV pump/equipment DME Agency:  [place] Arranged:  RN, PT, OT, Nurse's Aide [facility] Main Agency:  [place]  Status of Service:  Completed, signed off  If discussed at [place] of Stay Meetings, dates discussed:    Additional Comments:  Adron Bene, RN 07/19/2016, 11:28 AM

## 2016-07-19 NOTE — Progress Notes (Signed)
Physical Therapy Treatment Patient Details Name: Barry SKILLEN Sr. MRN: [id number] DOB: December 18, 1932 Today's Date: 07/19/2016    History of Present Illness HPI: Barry KINNE Sr. is a 83 y.o. male with medical history significant of dementia, CABG, HTN brought in by wife for fever. MSSA bacteremia    PT Comments    Pt demonstrates improved tolerance for gait this session even though he wants to refuse initially. Pt is able to perform gait with improved upright posture, improved cadence and tolerance for distance. Pt refuses to attempt stairs this session but wife feels comfortable taking pt up and down stairs.     Follow Up Recommendations  No PT follow up;Other (comment)     Equipment Recommendations  None recommended by PT    Recommendations for Other Services       Precautions / Restrictions Precautions Precautions: Fall Restrictions Weight Bearing Restrictions: No    Mobility  Bed Mobility Overal bed mobility: Needs Assistance Bed Mobility: Supine to Sit;Sit to Supine     Supine to sit: Min guard Sit to supine: Supervision   General bed mobility comments: Cues to initiate, 1 hand assist to sit up at EOB  Transfers Overall transfer level: Needs assistance Equipment used: None Transfers: Sit to/from Stand Sit to Stand: Min guard         General transfer comment: able to stand without assistance  Ambulation/Gait Ambulation/Gait assistance: Min guard Ambulation Distance (Feet): 150 Feet Assistive device: None Gait Pattern/deviations: Step-through pattern;Wide base of support Gait velocity: decreased Gait velocity interpretation: Below normal speed for age/gender General Gait Details: cues for self monitor of activity and environment, slow moving with [employer]   Stairs            Wheelchair Mobility    Modified Rankin (Stroke Patients Only)       Balance Overall balance assessment: Needs assistance Sitting-balance support: Feet supported Sitting  balance-Leahy Scale: Good     Standing balance support: No upper extremity supported Standing balance-Leahy Scale: Good                              Cognition Arousal/Alertness: Awake/alert Behavior During Therapy: WFL for tasks assessed/performed Overall Cognitive Status: History of cognitive impairments - at baseline                                        Exercises      General Comments        Pertinent Vitals/Pain Pain Assessment: Faces Faces Pain Scale: Hurts a little bit Pain Location: Headache Pain Descriptors / Indicators: Headache Pain Intervention(s): Monitored during session    Home Living                      Prior Function            PT Goals (current goals can now be found in the care plan section) Acute Rehab PT Goals Patient Stated Goal: Home soon Progress towards PT goals: Progressing toward goals    Frequency    Min 3X/week      PT Plan Current plan remains appropriate    Co-evaluation              AM-PAC PT "6 Clicks" Daily Activity  Outcome Measure  Difficulty turning over in bed (including adjusting bedclothes, sheets and blankets)?: None  Difficulty moving from lying on back to sitting on the side of the bed? : A Little Difficulty sitting down on and standing up from a chair with arms (e.g., wheelchair, bedside commode, etc,.)?: A Little Help needed moving to and from a bed to chair (including a wheelchair)?: A Little Help needed walking in hospital room?: A Little Help needed climbing 3-5 steps with a railing? : A Little 6 Click Score: 19    End of Session Equipment Utilized During Treatment: Gait belt Activity Tolerance: Patient tolerated treatment well Patient left: in bed;with call bell/phone within reach;with family/visitor present Nurse Communication: Mobility status PT Visit Diagnosis: Muscle weakness (generalized) (M62.81)     Time: 6861-6837 PT Time Calculation (min) (ACUTE  ONLY): 24 min  Charges:  $Gait Training: 8-22 mins $Therapeutic Activity: 8-22 mins                    G Codes:       Scheryl Marten PT, DPT  [phone]    Jacqulyn Liner Sloan Leiter 07/19/2016, 10:53 AM

## 2016-07-19 NOTE — Discharge Instructions (Signed)
Vancomycin injection What is this medicine? VANCOMYCIN (van koe MYE sin) is a glycopeptide antibiotic. It is used to treat certain kinds of bacterial infections. It will not work for colds, flu, or other viral infections. This medicine may be used for other purposes; ask your health care provider or pharmacist if you have questions. COMMON BRAND NAME(S): Vancocin What should I tell my health care provider before I take this medicine? They need to know if you have any of these conditions: -dehydration -hearing loss -kidney disease -other chronic illness -an unusual or allergic reaction to vancomycin, other medicines, foods, dyes, or preservatives -pregnant or trying to get pregnant -breast-feeding How should I use this medicine? This medicine is infused into a vein. It is usually given by a health care provider in a hospital or clinic. If you receive this medicine at home, you will receive special instructions. Take your medicine at regular intervals. Do not take your medicine more often than directed. Take all of your medicine as directed even if you think you are better. Do not skip doses or stop your medicine early. It is important that you put your used needles and syringes in a special sharps container. Do not put them in a trash can. If you do not have a sharps container, call your pharmacist or healthcare provider to get one. Talk to your pediatrician regarding the use of this medicine in children. While this drug may be prescribed for even very young infants for selected conditions, precautions do apply. Overdosage: If you think you have taken too much of this medicine contact a poison control center or emergency room at once. NOTE: This medicine is only for you. Do not share this medicine with others. What if I miss a dose? If you miss a dose, take it as soon as you can. If it is almost time for your next dose, take only that dose. Do not take double or extra doses. What may interact  with this medicine? -amphotericin B -anesthetics -bacitracin -birth control pills -cisplatin -colistin -diuretics -other aminoglycoside antibiotics -polymyxin B This list may not describe all possible interactions. Give your health care provider a list of all the medicines, herbs, non-prescription drugs, or dietary supplements you use. Also tell them if you smoke, drink alcohol, or use illegal drugs. Some items may interact with your medicine. What should I watch for while using this medicine? Tell your doctor or health care professional if your symptoms do not improve or if you get new symptoms. Your condition and lab work will be monitored while you are taking this medicine. Do not treat diarrhea with over the counter products. Contact your doctor if you have diarrhea that lasts more than 2 days or if it is severe and watery. What side effects may I notice from receiving this medicine? Side effects that you should report to your doctor or health care professional as soon as possible: -allergic reactions like skin rash, itching or hives, swelling of the face, lips, or tongue -breathing difficulty, wheezing -change in amount, color of urine -change in hearing -chest pain -dizziness -fever, chills -flushing of the face and neck (reddening) -low blood pressure -redness, blistering, peeling or loosening of the skin, including inside the mouth -unusual bleeding or bruising -unusually weak or tired Side effects that usually do not require medical attention (report to your doctor or health care professional if they continue or are bothersome): -nausea, vomiting -pain, swelling where injected -stomach cramps This list may not describe all possible side effects.   Call your doctor for medical advice about side effects. You may report side effects to FDA at 1-800-FDA-1088. Where should I keep my medicine? Keep out of the reach of children. You will be instructed on how to store this medicine,  if needed. Throw away any unused medicine after the expiration date on the label. NOTE: This sheet is a summary. It may not cover all possible information. If you have questions about this medicine, talk to your doctor, pharmacist, or health care provider.  2018 Elsevier/Gold Standard (2012-10-02 14:46:02)  

## 2016-07-20 DIAGNOSIS — F0390 Unspecified dementia without behavioral disturbance: Secondary | ICD-10-CM | POA: Diagnosis not present

## 2016-07-20 DIAGNOSIS — Z794 Long term (current) use of insulin: Secondary | ICD-10-CM | POA: Diagnosis not present

## 2016-07-20 DIAGNOSIS — A4901 Methicillin susceptible Staphylococcus aureus infection, unspecified site: Secondary | ICD-10-CM | POA: Diagnosis not present

## 2016-07-20 DIAGNOSIS — Z7982 Long term (current) use of aspirin: Secondary | ICD-10-CM | POA: Diagnosis not present

## 2016-07-20 DIAGNOSIS — Z7901 Long term (current) use of anticoagulants: Secondary | ICD-10-CM | POA: Diagnosis not present

## 2016-07-20 DIAGNOSIS — D696 Thrombocytopenia, unspecified: Secondary | ICD-10-CM | POA: Diagnosis not present

## 2016-07-20 DIAGNOSIS — E785 Hyperlipidemia, unspecified: Secondary | ICD-10-CM | POA: Diagnosis not present

## 2016-07-20 DIAGNOSIS — E1151 Type 2 diabetes mellitus with diabetic peripheral angiopathy without gangrene: Secondary | ICD-10-CM | POA: Diagnosis not present

## 2016-07-20 DIAGNOSIS — K5790 Diverticulosis of intestine, part unspecified, without perforation or abscess without bleeding: Secondary | ICD-10-CM | POA: Diagnosis not present

## 2016-07-20 DIAGNOSIS — Z5181 Encounter for therapeutic drug level monitoring: Secondary | ICD-10-CM | POA: Diagnosis not present

## 2016-07-20 DIAGNOSIS — I130 Hypertensive heart and chronic kidney disease with heart failure and stage 1 through stage 4 chronic kidney disease, or unspecified chronic kidney disease: Secondary | ICD-10-CM | POA: Diagnosis not present

## 2016-07-20 DIAGNOSIS — N183 Chronic kidney disease, stage 3 (moderate): Secondary | ICD-10-CM | POA: Diagnosis not present

## 2016-07-20 DIAGNOSIS — I482 Chronic atrial fibrillation: Secondary | ICD-10-CM | POA: Diagnosis not present

## 2016-07-20 DIAGNOSIS — I5022 Chronic systolic (congestive) heart failure: Secondary | ICD-10-CM | POA: Diagnosis not present

## 2016-07-20 DIAGNOSIS — E1159 Type 2 diabetes mellitus with other circulatory complications: Secondary | ICD-10-CM | POA: Diagnosis not present

## 2016-07-20 DIAGNOSIS — E1122 Type 2 diabetes mellitus with diabetic chronic kidney disease: Secondary | ICD-10-CM | POA: Diagnosis not present

## 2016-07-20 DIAGNOSIS — Z452 Encounter for adjustment and management of vascular access device: Secondary | ICD-10-CM | POA: Diagnosis not present

## 2016-07-20 DIAGNOSIS — C61 Malignant neoplasm of prostate: Secondary | ICD-10-CM | POA: Diagnosis not present

## 2016-07-20 LAB — CULTURE, BLOOD (SINGLE)
Culture: NO GROWTH
Special Requests: ADEQUATE

## 2016-07-21 LAB — CULTURE, BLOOD (SINGLE)
CULTURE: NO GROWTH
Special Requests: ADEQUATE

## 2016-07-22 ENCOUNTER — Telehealth: Payer: Self-pay | Admitting: Endocrinology

## 2016-07-22 DIAGNOSIS — C61 Malignant neoplasm of prostate: Secondary | ICD-10-CM | POA: Diagnosis not present

## 2016-07-22 DIAGNOSIS — I130 Hypertensive heart and chronic kidney disease with heart failure and stage 1 through stage 4 chronic kidney disease, or unspecified chronic kidney disease: Secondary | ICD-10-CM | POA: Diagnosis not present

## 2016-07-22 DIAGNOSIS — A4901 Methicillin susceptible Staphylococcus aureus infection, unspecified site: Secondary | ICD-10-CM | POA: Diagnosis not present

## 2016-07-22 DIAGNOSIS — N183 Chronic kidney disease, stage 3 (moderate): Secondary | ICD-10-CM | POA: Diagnosis not present

## 2016-07-22 DIAGNOSIS — E1122 Type 2 diabetes mellitus with diabetic chronic kidney disease: Secondary | ICD-10-CM | POA: Diagnosis not present

## 2016-07-22 DIAGNOSIS — I5022 Chronic systolic (congestive) heart failure: Secondary | ICD-10-CM | POA: Diagnosis not present

## 2016-07-22 DIAGNOSIS — B9561 Methicillin susceptible Staphylococcus aureus infection as the cause of diseases classified elsewhere: Secondary | ICD-10-CM | POA: Diagnosis not present

## 2016-07-22 NOTE — Telephone Encounter (Signed)
He can continue the higher doses of the Humalog mix until blood sugars are back to normal but he needs to restart the Toujeo also, it is unclear whether he is taking this or not

## 2016-07-22 NOTE — Telephone Encounter (Signed)
Today BS was fasting AM 333, yesterday fasting in AM 346, before dinner yesterday it was 445; bedtime 310  What should he do about the insulin dosing?

## 2016-07-22 NOTE — Telephone Encounter (Signed)
He is supposed to be taking 20 units of Toujeo, not clear if he is doing this now.  If he is not that he needs to start with 30 units of this.  Please clarify what exactly he has been taking for insulin doses

## 2016-07-22 NOTE — Telephone Encounter (Signed)
Spoke to patient wife and she stated that Barry Mcdaniel was in the hospital and has an infection in his blood stream and is currently on Vancomycin. Barry Mcdaniel states that she does not have some of the Humalog regular and to send to the [facility] on [address]. I have gone over the new dose for Humalog regular and advised that she could call back for any questions. She said he is taking 40 units in the am and take 36 units of the Humalog mix while he is on his medicine.

## 2016-07-22 NOTE — Telephone Encounter (Signed)
Has he had any prednisone, steroid injections or other new problems? He will need to take an additional 20 units of his Humalog mix insulin now  Also if he does not have any regular Humalog at home be will need to call in a prescription for Humalog to be taken when the blood sugar is high as follows: Blood sugar 250+, take 10 units, over 350, take 15 units

## 2016-07-22 NOTE — Telephone Encounter (Signed)
Patient's wife said the hospital put him on the 40am and 36pm until he finishes the Vancomycin. Patient's wife said that she will go back to the 30 in the am and the 26 in the pm after the antibiotic is finished. Please advise if this is what they need to do while he is on the antibiotic.

## 2016-07-22 NOTE — Telephone Encounter (Signed)
Right now it is 520

## 2016-07-23 ENCOUNTER — Other Ambulatory Visit: Payer: Self-pay

## 2016-07-23 MED ORDER — HUMALOG MIX 75/25 (75-25) 100 UNIT/ML ~~LOC~~ SUSP: 10.0000 [IU] | Freq: Two times a day (BID) | SUBCUTANEOUS | 1 refills | Status: DC

## 2016-07-23 MED ORDER — INSULIN LISPRO 100 UNIT/ML (KWIKPEN): PEN_INJECTOR | SUBCUTANEOUS | 2 refills | Status: AC

## 2016-07-23 NOTE — Telephone Encounter (Signed)
I spoke with patient's wife and let her know that I sent the humalog regular and the humalog mix refills to the [facility] pharmacy on [address]. She stated that she had the Toujeo and will start giving 30 units a day. I advised to her to call us back if she had any questions or if his blood sugars were not coming down with the new doses of insulin.

## 2016-07-25 ENCOUNTER — Telehealth: Payer: Self-pay

## 2016-07-25 DIAGNOSIS — N183 Chronic kidney disease, stage 3 (moderate): Secondary | ICD-10-CM | POA: Diagnosis not present

## 2016-07-25 DIAGNOSIS — I130 Hypertensive heart and chronic kidney disease with heart failure and stage 1 through stage 4 chronic kidney disease, or unspecified chronic kidney disease: Secondary | ICD-10-CM | POA: Diagnosis not present

## 2016-07-25 DIAGNOSIS — D309 Benign neoplasm of urinary organ, unspecified: Secondary | ICD-10-CM | POA: Diagnosis not present

## 2016-07-25 DIAGNOSIS — I5022 Chronic systolic (congestive) heart failure: Secondary | ICD-10-CM | POA: Diagnosis not present

## 2016-07-25 DIAGNOSIS — E1122 Type 2 diabetes mellitus with diabetic chronic kidney disease: Secondary | ICD-10-CM | POA: Diagnosis not present

## 2016-07-25 DIAGNOSIS — A4901 Methicillin susceptible Staphylococcus aureus infection, unspecified site: Secondary | ICD-10-CM | POA: Diagnosis not present

## 2016-07-25 DIAGNOSIS — C61 Malignant neoplasm of prostate: Secondary | ICD-10-CM | POA: Diagnosis not present

## 2016-07-25 NOTE — Telephone Encounter (Signed)
Received a call from [place] from [employer] and she wanted to call us to let us know that Mr. Barry Mcdaniel had a fasting blood sugar of 180 around 2pm and that his blood pressure was 150/78.

## 2016-07-29 ENCOUNTER — Encounter: Payer: Self-pay | Admitting: Infectious Diseases

## 2016-07-29 DIAGNOSIS — E1122 Type 2 diabetes mellitus with diabetic chronic kidney disease: Secondary | ICD-10-CM | POA: Diagnosis not present

## 2016-07-29 DIAGNOSIS — I5022 Chronic systolic (congestive) heart failure: Secondary | ICD-10-CM | POA: Diagnosis not present

## 2016-07-29 DIAGNOSIS — C61 Malignant neoplasm of prostate: Secondary | ICD-10-CM | POA: Diagnosis not present

## 2016-07-29 DIAGNOSIS — I130 Hypertensive heart and chronic kidney disease with heart failure and stage 1 through stage 4 chronic kidney disease, or unspecified chronic kidney disease: Secondary | ICD-10-CM | POA: Diagnosis not present

## 2016-07-29 DIAGNOSIS — A4901 Methicillin susceptible Staphylococcus aureus infection, unspecified site: Secondary | ICD-10-CM | POA: Diagnosis not present

## 2016-07-29 DIAGNOSIS — N183 Chronic kidney disease, stage 3 (moderate): Secondary | ICD-10-CM | POA: Diagnosis not present

## 2016-07-29 DIAGNOSIS — D309 Benign neoplasm of urinary organ, unspecified: Secondary | ICD-10-CM | POA: Diagnosis not present

## 2016-07-29 MED ORDER — OXYCODONE-ACETAMINOPHEN 5-325 MG PO TABS: 1.0000 | ORAL_TABLET | Freq: Three times a day (TID) | ORAL | 0 refills | Status: DC | PRN

## 2016-07-29 NOTE — Telephone Encounter (Signed)
High BP 140/70 pulse 51 Fatigue is better, burning when he urinates, clear  Home health calling

## 2016-07-29 NOTE — Telephone Encounter (Signed)
Patient need a refill of medication oxyCODONE-acetaminophen (ROXICET) 5-325 MG tablet  Call when ready

## 2016-07-29 NOTE — Telephone Encounter (Signed)
Okay, refill the same prescription

## 2016-07-29 NOTE — Telephone Encounter (Signed)
Please advise if this refill is okay.

## 2016-07-30 ENCOUNTER — Encounter (HOSPITAL_COMMUNITY): Payer: Self-pay

## 2016-07-30 ENCOUNTER — Ambulatory Visit (HOSPITAL_BASED_OUTPATIENT_CLINIC_OR_DEPARTMENT_OTHER): Payer: Medicare Other

## 2016-07-30 ENCOUNTER — Ambulatory Visit (HOSPITAL_COMMUNITY)
Admission: RE | Admit: 2016-07-30 | Discharge: 2016-07-30 | Disposition: A | Discharge status: Home / Self Care | Payer: Medicare Other | Source: Ambulatory Visit | Attending: Cardiovascular Disease | Admitting: Cardiovascular Disease

## 2016-07-30 ENCOUNTER — Encounter (HOSPITAL_COMMUNITY)
Admission: RE | Disposition: A | Discharge status: Home / Self Care | Payer: Self-pay | Source: Ambulatory Visit | Attending: Cardiovascular Disease

## 2016-07-30 DIAGNOSIS — I4510 Unspecified right bundle-branch block: Secondary | ICD-10-CM | POA: Insufficient documentation

## 2016-07-30 DIAGNOSIS — Z86711 Personal history of pulmonary embolism: Secondary | ICD-10-CM | POA: Insufficient documentation

## 2016-07-30 DIAGNOSIS — I10 Essential (primary) hypertension: Secondary | ICD-10-CM | POA: Insufficient documentation

## 2016-07-30 DIAGNOSIS — E1151 Type 2 diabetes mellitus with diabetic peripheral angiopathy without gangrene: Secondary | ICD-10-CM | POA: Insufficient documentation

## 2016-07-30 DIAGNOSIS — R7881 Bacteremia: Secondary | ICD-10-CM | POA: Diagnosis not present

## 2016-07-30 DIAGNOSIS — I350 Nonrheumatic aortic (valve) stenosis: Secondary | ICD-10-CM | POA: Diagnosis not present

## 2016-07-30 HISTORY — PX: TEE WITHOUT CARDIOVERSION: SHX5443

## 2016-07-30 LAB — GLUCOSE, CAPILLARY: GLUCOSE-CAPILLARY: 114 mg/dL — AB (ref 65–99)

## 2016-07-30 LAB — POCT I-STAT 4, (NA,K, GLUC, HGB,HCT)
GLUCOSE: 115 mg/dL — AB (ref 65–99)
HCT: 30 % — ABNORMAL LOW (ref 39.0–52.0)
Hemoglobin: 10.2 g/dL — ABNORMAL LOW (ref 13.0–17.0)
Potassium: 3.9 mmol/L (ref 3.5–5.1)
Sodium: 144 mmol/L (ref 135–145)

## 2016-07-30 SURGERY — ECHOCARDIOGRAM, TRANSESOPHAGEAL
Anesthesia: Moderate Sedation

## 2016-07-30 MED ORDER — DIPHENHYDRAMINE HCL 50 MG/ML IJ SOLN
INTRAMUSCULAR | Status: AC
  Filled 2016-07-30: qty 1

## 2016-07-30 MED ORDER — SODIUM CHLORIDE 0.9 % IV SOLN
INTRAVENOUS | Status: DC
  Administered 2016-07-30: 500 mL via INTRAVENOUS

## 2016-07-30 MED ORDER — FENTANYL CITRATE (PF) 100 MCG/2ML IJ SOLN
INTRAMUSCULAR | Status: DC | PRN
  Administered 2016-07-30 (×2): 25 ug via INTRAVENOUS

## 2016-07-30 MED ORDER — FENTANYL CITRATE (PF) 100 MCG/2ML IJ SOLN
INTRAMUSCULAR | Status: AC
  Filled 2016-07-30: qty 2

## 2016-07-30 MED ORDER — BUTAMBEN-TETRACAINE-BENZOCAINE 2-2-14 % EX AERO
INHALATION_SPRAY | CUTANEOUS | Status: DC | PRN
  Administered 2016-07-30: 2 via TOPICAL

## 2016-07-30 MED ORDER — MIDAZOLAM HCL 5 MG/ML IJ SOLN
INTRAMUSCULAR | Status: AC
  Filled 2016-07-30: qty 2

## 2016-07-30 MED ORDER — MIDAZOLAM HCL 10 MG/2ML IJ SOLN
INTRAMUSCULAR | Status: DC | PRN
  Administered 2016-07-30: 1 mg via INTRAVENOUS
  Administered 2016-07-30: 2 mg via INTRAVENOUS
  Administered 2016-07-30: 1 mg via INTRAVENOUS

## 2016-07-30 NOTE — Discharge Instructions (Signed)

## 2016-07-30 NOTE — H&P (Signed)
Barry Sudduth Mixon Sr. is a 83 y.o. male who has presented today for surgery, with the diagnosis of bacteremia. The various methods of treatment have been discussed with the patient and family. After consideration of risks, benefits and other options for treatment, the patient has consented to Procedure(s): TRANSESOPHAGEAL ECHOCARDIOGRAM (TEE) (N/A) as a surgical intervention . The patient's history has been reviewed, patient examined, no change in status, stable for surgery. I have reviewed the patient's chart and labs. Questions were answered to the patient's satisfaction.   Tayli Buch C. Oval Linsey, MD, [facility]  07/30/2016 7:57 AM

## 2016-07-30 NOTE — CV Procedure (Addendum)
Brief TEE Note  LVEF 55-60% Mild MR Moderate mitral annular calcification Moderate AS.  Mean gradient 25 mmHg.  Severe calcification and restricted movement of the right and non-coronary cusps.   No LA/LAA thrombus or vegetation. No evidence of endocarditis.  During this procedure the patient is administered a total of Versed 4 mg and Fentanyl 50 mcg to achieve and maintain moderate conscious sedation.  The patient's heart rate, blood pressure, and oxygen saturation are monitored continuously during the procedure. The period of conscious sedation is 20 minutes, of which I was present face-to-face 100% of this time.  For additional details see full report.   Reise Gladney C. Oval Linsey, MD, [facility] 07/30/2016 8:37 AM

## 2016-07-30 NOTE — Telephone Encounter (Signed)
Called and left a voice message that his prescription is ready for pick up at the front desk.

## 2016-07-31 ENCOUNTER — Telehealth: Payer: Self-pay | Admitting: Endocrinology

## 2016-07-31 ENCOUNTER — Telehealth: Payer: Self-pay | Admitting: *Deleted

## 2016-07-31 ENCOUNTER — Telehealth: Payer: Self-pay | Admitting: Infectious Diseases

## 2016-07-31 NOTE — Telephone Encounter (Signed)
Pt's tee (-) He can stop IV vanco and have PIC pulled.  Spoke with wife.  Can cancel his f/u visit and have repeat BCx done at PCP office. Wife understands this and will relay to his PCP.

## 2016-07-31 NOTE — Telephone Encounter (Signed)
please pull PIC, stop anbx I let his wife note.

## 2016-07-31 NOTE — Telephone Encounter (Signed)
I will defer to Dr. Johnnye Sima who saw the patient in the hospital.  thanks

## 2016-07-31 NOTE — Telephone Encounter (Signed)
Patient wife called to advise that the echo he recently had done showed no infection in the heart. She wants to know if there is any reason to keep appt on 08/08/16. Advised will ask the doctor and give her a call back. She advised he is to stop IV medication Friday 08/02/16.

## 2016-07-31 NOTE — Telephone Encounter (Signed)
Pt declined all home therapy

## 2016-08-01 ENCOUNTER — Telehealth: Payer: Self-pay | Admitting: Pharmacist

## 2016-08-01 NOTE — Telephone Encounter (Signed)
Called [name] at [facility] and gave verbal orders per Dr. Johnnye Sima to stop IV vanc and pull patient's PICC today. She verbalized understanding.

## 2016-08-05 DIAGNOSIS — E1122 Type 2 diabetes mellitus with diabetic chronic kidney disease: Secondary | ICD-10-CM | POA: Diagnosis not present

## 2016-08-05 DIAGNOSIS — C61 Malignant neoplasm of prostate: Secondary | ICD-10-CM | POA: Diagnosis not present

## 2016-08-05 DIAGNOSIS — I5022 Chronic systolic (congestive) heart failure: Secondary | ICD-10-CM | POA: Diagnosis not present

## 2016-08-05 DIAGNOSIS — I130 Hypertensive heart and chronic kidney disease with heart failure and stage 1 through stage 4 chronic kidney disease, or unspecified chronic kidney disease: Secondary | ICD-10-CM | POA: Diagnosis not present

## 2016-08-05 DIAGNOSIS — A4901 Methicillin susceptible Staphylococcus aureus infection, unspecified site: Secondary | ICD-10-CM | POA: Diagnosis not present

## 2016-08-05 DIAGNOSIS — N183 Chronic kidney disease, stage 3 (moderate): Secondary | ICD-10-CM | POA: Diagnosis not present

## 2016-08-08 ENCOUNTER — Ambulatory Visit: Payer: Medicare Other | Admitting: Internal Medicine

## 2016-08-09 ENCOUNTER — Other Ambulatory Visit: Payer: Self-pay

## 2016-08-09 MED ORDER — HUMALOG MIX 75/25 (75-25) 100 UNIT/ML ~~LOC~~ SUSP: SUBCUTANEOUS | 1 refills | Status: DC

## 2016-08-21 ENCOUNTER — Telehealth: Payer: Self-pay | Admitting: Endocrinology

## 2016-08-21 MED ORDER — OXYCODONE-ACETAMINOPHEN 5-325 MG PO TABS: 1.0000 | ORAL_TABLET | Freq: Three times a day (TID) | ORAL | 0 refills | Status: DC | PRN

## 2016-08-21 NOTE — Telephone Encounter (Signed)
Rx printed and placed on MD's desk to sign.

## 2016-08-21 NOTE — Telephone Encounter (Signed)
I contacted the patient and advised Rx is ready for pick up via voicemail. Requested a call back if the patient would like to discuss further. Rx placed upfront for patient to pick up.

## 2016-08-21 NOTE — Telephone Encounter (Signed)
**  Remind patient they can make refill requests via MyChart**  Medication refill request (Name & Dosage):  oxyCODONE-acetaminophen (ROXICET) 5-325 MG tablet  Other comments (if applicable):   Please call patient when rx is in the front office.

## 2016-08-26 ENCOUNTER — Other Ambulatory Visit (INDEPENDENT_AMBULATORY_CARE_PROVIDER_SITE_OTHER): Payer: Medicare Other

## 2016-08-26 DIAGNOSIS — Z794 Long term (current) use of insulin: Secondary | ICD-10-CM | POA: Diagnosis not present

## 2016-08-26 DIAGNOSIS — E1165 Type 2 diabetes mellitus with hyperglycemia: Secondary | ICD-10-CM

## 2016-08-26 LAB — COMPREHENSIVE METABOLIC PANEL
ALK PHOS: 70 U/L (ref 39–117)
ALT: 8 U/L (ref 0–53)
AST: 13 U/L (ref 0–37)
Albumin: 3.7 g/dL (ref 3.5–5.2)
BUN: 30 mg/dL — AB (ref 6–23)
CHLORIDE: 110 meq/L (ref 96–112)
CO2: 26 mEq/L (ref 19–32)
Calcium: 9.4 mg/dL (ref 8.4–10.5)
Creatinine, Ser: 1.42 mg/dL (ref 0.40–1.50)
GFR: 50.8 mL/min — ABNORMAL LOW (ref >60.00)
GLUCOSE: 94 mg/dL (ref 70–99)
POTASSIUM: 4.9 meq/L (ref 3.5–5.1)
SODIUM: 142 meq/L (ref 135–145)
TOTAL PROTEIN: 6.7 g/dL (ref 6.0–8.3)
Total Bilirubin: 0.3 mg/dL (ref 0.2–1.2)

## 2016-08-26 LAB — HEMOGLOBIN A1C: HEMOGLOBIN A1C: 7.9 % — AB (ref 4.6–6.5)

## 2016-08-29 ENCOUNTER — Ambulatory Visit (INDEPENDENT_AMBULATORY_CARE_PROVIDER_SITE_OTHER): Payer: Medicare Other | Admitting: Endocrinology

## 2016-08-29 ENCOUNTER — Encounter: Payer: Self-pay | Admitting: Endocrinology

## 2016-08-29 VITALS — BP 100/60 | HR 71 | Ht 71.0 in | Wt 187.0 lb

## 2016-08-29 DIAGNOSIS — D508 Other iron deficiency anemias: Secondary | ICD-10-CM | POA: Diagnosis not present

## 2016-08-29 DIAGNOSIS — I952 Hypotension due to drugs: Secondary | ICD-10-CM | POA: Diagnosis not present

## 2016-08-29 DIAGNOSIS — Z794 Long term (current) use of insulin: Secondary | ICD-10-CM

## 2016-08-29 DIAGNOSIS — E1165 Type 2 diabetes mellitus with hyperglycemia: Secondary | ICD-10-CM

## 2016-08-29 MED ORDER — ALBUTEROL SULFATE HFA 108 (90 BASE) MCG/ACT IN AERS: 2.0000 | INHALATION_SPRAY | Freq: Four times a day (QID) | RESPIRATORY_TRACT | 1 refills | Status: DC | PRN

## 2016-08-29 MED ORDER — VICTOZA 18 MG/3ML ~~LOC~~ SOPN: 1.2000 mg | PEN_INJECTOR | Freq: Every day | SUBCUTANEOUS | 3 refills | Status: DC

## 2016-08-29 NOTE — Patient Instructions (Addendum)
Start VICTOZA injection as shown once daily at the same time of the day.   Dial the dose to 0.6 mg on the pen for the first week.    You may inject in the stomach, thigh or arm. You may experience nausea in the first few days which usually goes away.  You will feel fullness of the stomach with starting the medication and should try to keep the portions at meals small.   After 1 week increase the dose to 1.2mg  daily if no nausea present.    If any questions or concerns are present call the office or the [place] helpline at [phone]. Visit [email or web address].info/ for more useful information  Stop lisinopril

## 2016-08-29 NOTE — Progress Notes (Signed)
Patient ID: Barry Officer Sr., male   DOB: 01-21-1933, 83 y.o.   MRN: [id number]   Reason for Appointment: follow-up  of various issues   History of Present Illness   Diagnosis: Type 2 diabetes mellitus, date of diagnosis: 1997.   He has been on basal bolus insulin regimen for a few years.  His blood sugars are usually variably controlled and A1c is usually over 8%  except once was 7.2.  Previously had tried Victoza with some benefit but did not continue this because of perceived side effect of constipation and also out-of-pocket expense  RECENT history:   Insulin regimen: Toujeo insulin 20 units in am  Humalog mix insulin 26 units-26 acs   He has had inconsistent control of his diabetes since at least late 2015 Although his blood sugars improved  after changing Humalog to Humalog mix insulin before each meal in 2/16 he has had labile blood sugars  He still has poor control with A1c going up to 7.9 and previously 6.8  His blood sugars are showing the following patterns and has the following problems identified:  His Toujeo was apparently stopped when he was discharged from the hospital but more recently has been started back again at 20 units  FASTING blood sugars are very inconsistent and generally much higher for the last 9 days or so  His wife thinks that fasting readings may be higher because of his eating late at night with cookies; even if blood sugar is fairly good at bedtime the next morning it is over 200 now  He is checking blood sugars mostly at breakfast and around 5 PM  Blood sugars AFTER about 6 PMR fairly consistently high and averaging well over 200 based on his diet  Still does not watch his snacks and sweets like ice cream  For various reasons she does not do much exercise  He still has inconsistent times when he is eating meals  Monitors blood glucose: 1-2 times  a day.  Glucometer:  One Touch.  Glucose readings    Mean values apply  above for all meters except median for One Touch  PRE-MEAL Fasting Lunch Dinner Bedtime Overall  Glucose range:  53-299   80-3 59  1 13-412    Mean/median: 183  153  243  296  196+/-88    Dietician visit: Most recent:1/14.  CDE visit 10/16   Exercise: a little walking  Wt Readings from Last 3 Encounters:  08/29/16 187 lb (84.8 kg)  07/19/16 169 lb 15.6 oz (77.1 kg)  07/01/16 189 lb 6.4 oz (85.9 kg)   Lab Results  Component Value Date   HGBA1C 7.9 (H) 08/26/2016   HGBA1C 6.8 (H) 03/22/2016   HGBA1C 7.0 (H) 11/28/2015   Lab Results  Component Value Date   MICROALBUR 8.2 (H) 12/15/2015   LDLCALC 53 10/31/2015   CREATININE 1.42 08/26/2016    Other ACTIVE problems are discussed in review of systems    Lab on 08/26/2016  Component Date Value Ref Range Status  . Hgb A1c MFr Bld 08/26/2016 7.9* 4.6 - 6.5 % Final   Glycemic Control Guidelines for People with Diabetes:Non Diabetic:  <6%Goal of Therapy: <7%Additional Action Suggested:  >8%   . Sodium 08/26/2016 142  135 - 145 mEq/L Final  . Potassium 08/26/2016 4.9  3.5 - 5.1 mEq/L Final  . Chloride 08/26/2016 110  96 - 112 mEq/L Final  . CO2 08/26/2016 26  19 - 32 mEq/L Final  . Glucose, Bld 08/26/2016 94  70 - 99 mg/dL Final  . BUN 08/26/2016 30* 6 - 23 mg/dL Final  . Creatinine, Ser 08/26/2016 1.42  0.40 - 1.50 mg/dL Final  . Total Bilirubin 08/26/2016 0.3  0.2 - 1.2 mg/dL Final  . Alkaline Phosphatase 08/26/2016 70  39 - 117 U/L Final  . AST 08/26/2016 13  0 - 37 U/L Final  . ALT 08/26/2016 8  0 - 53 U/L Final  . Total Protein 08/26/2016 6.7  6.0 - 8.3 g/dL Final  . Albumin 08/26/2016 3.7  3.5 - 5.2 g/dL Final  . Calcium 08/26/2016 9.4  8.4 - 10.5 mg/dL Final  . GFR 08/26/2016 50.80* >60.00 mL/min Final    OTHER active problems addressed today are reviewed in review of systems   Allergies as of 08/29/2016      Reactions   Penicillins Swelling   Eyes and lips Has patient had a PCN reaction causing immediate rash,  facial/tongue/throat swelling, SOB or lightheadedness with hypotension: Yes Has patient had a PCN reaction causing severe rash involving mucus membranes or skin necrosis: No Has patient had a PCN reaction that required hospitalization No Has patient had a PCN reaction occurring within the last 10 years: No If all of the above answers are "NO", then may proceed with Cephalosporin use   Prednisone Other (See Comments)   Hyperglycemia and AMS      Medication List       Accurate as of 08/29/16  1:32 PM. Always use your most recent med list.          acetaminophen 500 MG tablet Commonly known as:  TYLENOL Take 500 mg by mouth every 8 (eight) hours as needed for mild pain, moderate pain or headache.   albuterol 108 (90 Base) MCG/ACT inhaler Commonly known as:  PROVENTIL HFA;VENTOLIN HFA Inhale 2 puffs into the lungs every 6 (six) hours as needed for wheezing or shortness of breath.   apixaban 5 MG Tabs tablet Commonly known as:  ELIQUIS Take 1 tablet (5 mg total) by mouth 2 (two) times daily.   aspirin EC 81 MG tablet Take 81 mg by mouth daily.   budesonide-formoterol 160-4.5 MCG/ACT inhaler Commonly known as:  SYMBICORT Inhale 2 puffs into the lungs 2 (two) times daily.   ferrous sulfate 325 (65 FE) MG EC tablet Take 325 mg by mouth daily with supper.   finasteride 5 MG tablet Commonly known as:  PROSCAR Take 5 mg by mouth daily.   gabapentin 300 MG capsule Commonly known as:  NEURONTIN TAKE ONE CAPSULE BY MOUTH 4 TIMES DAILY   HUMALOG MIX 75/25 (75-25) 100 UNIT/ML Susp injection Generic drug:  insulin lispro protamine-lispro Take 30 units subcutaneously daily with breakfast and 26 units with supper   insulin lispro 100 UNIT/ML KiwkPen Commonly known as:  HUMALOG KWIKPEN If blood sugar is 250+, take 10 units, if blood sugar is over 350, take 15 units. Dx code E11.9   levothyroxine 112 MCG tablet Commonly known as:  SYNTHROID, LEVOTHROID TAKE ONE TABLET BY MOUTH ONCE  DAILY   lisinopril 20 MG tablet Commonly known as:  PRINIVIL,ZESTRIL Take 20 mg by mouth daily.   menthol-thymol Liqd Apply 1 application topically 3 (three) times daily as needed (pain). For pain.   NASACORT AQ 55 MCG/ACT Aero nasal inhaler Generic drug:  triamcinolone Place 2 sprays into the nose daily.   nitroGLYCERIN 0.4 MG SL tablet Commonly known as:  NITROSTAT PLACE  ONE TABLET UNDER THE TONGUE EVERY 5 MINUTES AS NEEDED FOR CHEST PAIN   oxyCODONE-acetaminophen 5-325 MG tablet Commonly known as:  ROXICET Take 1 tablet by mouth every 8 (eight) hours as needed for severe pain.   polyethylene glycol packet Commonly known as:  MIRALAX / GLYCOLAX Take 17 g by mouth daily as needed (constipation). Mix in 8 oz liquid and drink   rosuvastatin 10 MG tablet Commonly known as:  CRESTOR Take 1 tablet (10 mg total) by mouth daily.   SYSTANE 0.4-0.3 % Soln Generic drug:  Polyethyl Glycol-Propyl Glycol Apply 1-2 drops to eye 3 (three) times daily as needed (for dry eyes).   vancomycin IVPB Inject 750 mg into the vein every 12 (twelve) hours. Indication:  bacteremia Last Day of Therapy:  5/25 Labs - Sunday/Monday:  CBC/D, BMP, and vancomycin trough. Labs - Thursday:  BMP and vancomycin trough Labs - Every other week:  ESR and CRP   VICTOZA 18 MG/3ML Sopn Generic drug:  liraglutide Inject 0.2 mLs (1.2 mg total) into the skin daily. Inject once daily at the same time   vitamin B-12 1000 MCG tablet Commonly known as:  CYANOCOBALAMIN Take 1,000 mcg by mouth daily.       Allergies:  Allergies  Allergen Reactions  . Penicillins Swelling    Eyes and lips Has patient had a PCN reaction causing immediate rash, facial/tongue/throat swelling, SOB or lightheadedness with hypotension: Yes Has patient had a PCN reaction causing severe rash involving mucus membranes or skin necrosis: No Has patient had a PCN reaction that required hospitalization No Has patient had a PCN reaction  occurring within the last 10 years: No If all of the above answers are "NO", then may proceed with Cephalosporin use   . Prednisone Other (See Comments)    Hyperglycemia and AMS    Past Medical History:  Diagnosis Date  . Anemia    hx of year ago no problems now per pt   . Arthritis    generalized   . Asthma   . Atypical chest pain   . Bladder neck contracture   . Chronic back pain   . Complication of anesthesia    POST OP HYPOTENSION  . Dementia    per wife pt had neurological evaluation  . Diverticulosis of colon   . First degree heart block   . GERD (gastroesophageal reflux disease)   . Heart murmur   . History of basal cell carcinoma excision    forehead and x6 area forearms  . History of Bell's palsy    x2  1998 &  2004 left side --  residual slight left eye droop and vace  . History of chronic bronchitis   . History of colon polyps   . History of gout   . History of hypertension   . History of MI (myocardial infarction)    08/ 2010  secondary acute renal failure/ dehydration--  medical management  . History of pulmonary embolus (PE)    2010  . Hypogonadism male   . Hypotension   . Hypothyroidism   . OSA (obstructive sleep apnea)    severe per study 2007/  non-compliant cpap  . Peripheral vascular disease ([place])   . Prostate carcinoma [facility] urologist-  dr Gaynelle Arabian   DX 2007  Gleason 6  and recurrent 2012  s/p  cryoablation of prostate both times  . RBBB (right bundle branch block)   . Recovering alcoholic in remission ([place])   . Renal calculus, bilateral   .  Type 2 diabetes mellitus ([place])     Past Surgical History:  Procedure Laterality Date  . CARDIAC CATHETERIZATION N/A 11/27/2015   Procedure: Left Heart Cath and Coronary Angiography;  Surgeon: Peter M [name], MD;  Location: [place] [facility];  Service: Cardiovascular;  Laterality: N/A;  . CARDIOVASCULAR STRESS TEST  05-19-2012  dr Tressia Miners turner   normal perfusionstudy/  no ischemia/  attenuation artifact  inferoapex region of myocardium/  note gate secondary to rhythm irregularity  . CARDIOVERSION N/A 01/05/2016   Procedure: CARDIOVERSION;  Surgeon: Sanda Klein, MD;  Location: [place];  Service: Cardiovascular;  Laterality: N/A;  . CATARACT EXTRACTION W/ INTRAOCULAR LENS IMPLANT Right 2014  . COLONOSCOPY W/ POLYPECTOMY  last one 08-10-2013  . CORONARY ARTERY BYPASS GRAFT N/A 11/29/2015   Procedure: CORONARY ARTERY BYPASS GRAFTING (CABG), ON PUMP, TIMES FIVE, USING LEFT INTERNAL MAMMARY ARTERY, RIGHT GREATER SAPHENOUS VEIN HARVESTED ENDOSCOPICALLY;  Surgeon: [name], MD;  Location: [place];  Service: Open Heart Surgery;  Laterality: N/A;  LIMA-LAD; SEQ SVG-OM1-OM2;SVG-DIAG; SVG-PL  . CYSTOSCOPY WITH URETHRAL DILATATION N/A 01/14/2014   Procedure: URETHRAL MEATAL DILATATION;  Surgeon: Ailene Rud, MD;  Location: [facility];  Service: Urology;  Laterality: N/A;  . EXCISION BENIGN SKIN LESION OF BACK  10/ 2011  . HIATAL HERNIA REPAIR  1983  . INGUINAL HERNIA REPAIR Right 10-14-2001  . INTRAOPERATIVE TRANSESOPHAGEAL ECHOCARDIOGRAM N/A 11/29/2015   Procedure: INTRAOPERATIVE TRANSESOPHAGEAL ECHOCARDIOGRAM;  Surgeon: [name], MD;  Location: [place];  Service: Open Heart Surgery;  Laterality: N/A;  . KNEE ARTHROSCOPY W/ MENISCECTOMY Right 01-25-2003   and chondroplasty  . [place]  . PROSTATE CRYOABLATION  09-17-2010  &  04-12-2005  . REPAIR RIGHT ARM TENDON INJURY  1975  . SHOULDER OPEN ROTATOR CUFF REPAIR  03/20/2011   Procedure: ROTATOR CUFF REPAIR SHOULDER OPEN;  Surgeon: Tobi Bastos;  Location: [facility];  Service: Orthopedics;  Laterality: Right;  . TEE WITHOUT CARDIOVERSION N/A 01/05/2016   Procedure: TRANSESOPHAGEAL ECHOCARDIOGRAM (TEE);  Surgeon: Sanda Klein, MD;  Location: [place];  Service: Cardiovascular;  Laterality: N/A;  . TEE WITHOUT CARDIOVERSION N/A 07/30/2016   Procedure: TRANSESOPHAGEAL ECHOCARDIOGRAM (TEE);   Surgeon: Skeet Latch, MD;  Location: [place];  Service: Cardiovascular;  Laterality: N/A;  . TONSILLECTOMY  as child  . TRANSTHORACIC ECHOCARDIOGRAM  01-11-2013  dr [name]   moderate LVH/  grade I diastolic dysfunction/ ef 11-03%/  mild AV stenosis /  mild MR/  mild LAE  . TRANSURETHRAL RESECTION OF BLADDER TUMOR N/A 01/14/2014   Procedure: TRANSURETHRAL INCISION OF BLADDER NECK CONTRACTURE;  Surgeon: Ailene Rud, MD;  Location: [facility];  Service: Urology;  Laterality: N/A;  . Vantus Implant     Hormones for Prostate  . VENTRAL HERNIA REPAIR  1998    Family History  Problem Relation Age of Onset  . Heart disease Mother   . Stroke Father   . Cancer Brother        unsure of type  . Cancer Sister   . Diabetes Sister   . Diabetes Sister   . Stroke Brother   . Cancer Brother   . Cancer Brother   . Cancer Brother   . Cancer Brother   . Cancer Brother     Social History:  reports that he has never smoked. He has never used smokeless tobacco. He reports that he does not drink alcohol or use drugs.  Review  of Systems   He has  COPD with chronic cough, treated by the pulmonologist  He gets relief with Robitussin Generic cough syrup  However he wants to take albuterol as needed for cough and wheezing at times despite taking Symbicort regularly   HYPERLIPIDEMIA: Well controlled with Crestor, needs follow-up   Lab Results  Component Value Date   CHOL 117 10/31/2015   HDL 44.60 10/31/2015   LDLCALC 53 10/31/2015   TRIG 98.0 10/31/2015   CHOLHDL 3 10/31/2015     He was diagnosed have had sleep apnea but he Declined CPAP    HYPERTENSION: Blood pressure had been Treated with lisinopril,  started after his hospitalizationBy cardiology However even with reducing the dose to half tablet His blood pressure appears to be relatively low He does feel lightheaded at times Home readings are also has low as 95 systolic   BP Readings from Last 3  Encounters:  08/29/16 100/60  07/30/16 (!) 167/67  07/19/16 (!) 155/85    History of mild hypothyroidism This is  controlled on 112 g 6 days per week     Lab Results  Component Value Date   TSH 1.39 05/30/2016   TSH 0.46 03/22/2016   TSH 0.18 (L) 01/23/2016   FREET4 1.16 03/22/2016   FREET4 0.79 12/12/2015   FREET4 1.09 04/18/2015    ANEMIA: This is  Persistent Despite  taking iron, Usually OTC ferrous sulfate With vitamin C   B-12 deficiency:He has been on B12 tablets 1000 mg daily   Lab Results  Component Value Date   WBC 7.6 07/19/2016   HGB 10.2 (L) 07/30/2016   HCT 30.0 (L) 07/30/2016   MCV 87.6 07/19/2016   PLT 152 07/19/2016      Examination:   BP 100/60 (Cuff Size: Normal)   Pulse 71   Ht _0  (1.803 m)   Wt 187 lb (84.8 kg)   SpO2 97%   BMI 26.08 kg/m   Body mass index is 26.08 kg/m.      Assesment/Plan:  Diabetes type 2, uncontrolled  See history of present illness for detailed discussion of his current management, blood sugar patterns and problems identified  Blood sugars are still poorly controlled  A1c is now nearly 8% but his home blood sugars are averaging just over 200 mostly high postprandial readings Also recently high fasting readings because of poor diet and late night snacking He has persistently difficult issues with not watching his diet and eating high glycemic foods and snacks He has refused to do the V-go pump in the past  Today recommended that we try Victoza again He has tried this before and had nonspecific side effects of constipation Discussed importance of controlling postprandial readings and need to improve his diet Discussed A1c results Explained to him in detail how Victoza works, dosage titration, possible side effects and use of the Victoza pen If he is able to cut back on snacking at night his morning sugars may improve but otherwise will need to increase Toujeo Also discussed that if blood sugars are lower  in the afternoon we may need to reduce his first dose of premixed insulin  ANEMIA:  Needs follow-up   ?  Hypertension: Blood pressure is relatively low and he will stop lisinopril   Patient Instructions  Start VICTOZA injection as shown once daily at the same time of the day.   Dial the dose to 0.6 mg on the pen for the first week.    You may inject  in the stomach, thigh or arm. You may experience nausea in the first few days which usually goes away.  You will feel fullness of the stomach with starting the medication and should try to keep the portions at meals small.   After 1 week increase the dose to 1.35m daily if no nausea present.    If any questions or concerns are present call the office or the [facility] at [phone] Visit [email or web address] more useful information  Stop lisinopril     Counseling time on subjects discussed above is over 50% of today's 25 minute visit   Tationna Fullard 08/29/2016, 1:32 PM

## 2016-09-02 ENCOUNTER — Other Ambulatory Visit: Payer: Self-pay

## 2016-09-02 MED ORDER — ROSUVASTATIN CALCIUM 10 MG PO TABS: 10.0000 mg | ORAL_TABLET | Freq: Every day | ORAL | 2 refills | Status: AC

## 2016-09-03 ENCOUNTER — Other Ambulatory Visit: Payer: Self-pay | Admitting: Endocrinology

## 2016-09-04 ENCOUNTER — Other Ambulatory Visit: Payer: Self-pay

## 2016-09-04 MED ORDER — OXYCODONE-ACETAMINOPHEN 5-325 MG PO TABS: 1.0000 | ORAL_TABLET | Freq: Three times a day (TID) | ORAL | 0 refills | Status: DC | PRN

## 2016-09-04 NOTE — Telephone Encounter (Signed)
Called patient and let him know that his prescription is ready for pick up at the front desk.

## 2016-09-04 NOTE — Telephone Encounter (Signed)
Patient calling to inquire if script for oxycodone is ready for pickup. Advised it's at the front desk.  -LL

## 2016-09-04 NOTE — Telephone Encounter (Signed)
See message to be advised.  

## 2016-09-20 DIAGNOSIS — Z85828 Personal history of other malignant neoplasm of skin: Secondary | ICD-10-CM | POA: Diagnosis not present

## 2016-09-20 DIAGNOSIS — D1801 Hemangioma of skin and subcutaneous tissue: Secondary | ICD-10-CM | POA: Diagnosis not present

## 2016-09-20 DIAGNOSIS — L720 Epidermal cyst: Secondary | ICD-10-CM | POA: Diagnosis not present

## 2016-09-20 DIAGNOSIS — L821 Other seborrheic keratosis: Secondary | ICD-10-CM | POA: Diagnosis not present

## 2016-09-20 DIAGNOSIS — C44319 Basal cell carcinoma of skin of other parts of face: Secondary | ICD-10-CM | POA: Diagnosis not present

## 2016-09-25 ENCOUNTER — Telehealth: Payer: Self-pay | Admitting: Endocrinology

## 2016-09-25 NOTE — Telephone Encounter (Signed)
**  Remind patient they can make refill requests via MyChart**  Medication refill request (Name & Dosage):  Oxycodone-acetaminophen  (Roxicet) 5-325 mg tab  Preferred pharmacy (Name & Address):  Pick up in office  Other comments (if applicable):

## 2016-09-26 ENCOUNTER — Telehealth: Payer: Self-pay

## 2016-09-26 ENCOUNTER — Other Ambulatory Visit: Payer: Self-pay

## 2016-09-26 MED ORDER — OXYCODONE-ACETAMINOPHEN 5-325 MG PO TABS: 1.0000 | ORAL_TABLET | Freq: Three times a day (TID) | ORAL | 0 refills | Status: DC | PRN

## 2016-09-26 NOTE — Telephone Encounter (Signed)
Routing to you °

## 2016-09-26 NOTE — Telephone Encounter (Signed)
Has been printed and waiting for doctor signiture

## 2016-09-26 NOTE — Telephone Encounter (Signed)
I called patient earlier today and let him know that his Oxycodone Rx was ready for pick up at the front desk.

## 2016-09-26 NOTE — Telephone Encounter (Signed)
Called patient and let him know that his Oxycodone-acetaminophen prescription is ready for pick up at the front desk.

## 2016-10-02 ENCOUNTER — Telehealth: Payer: Self-pay | Admitting: Endocrinology

## 2016-10-02 ENCOUNTER — Telehealth: Payer: Self-pay | Admitting: Cardiovascular Disease

## 2016-10-02 NOTE — Telephone Encounter (Signed)
Recommend that he be seen by his cardiologist to evaluate the pulse and fluctuating blood pressure, have spoken to his wife

## 2016-10-02 NOTE — Telephone Encounter (Signed)
Called patient's wife and she stated that she has been taking his blood pressure also and the last reading was 148/77 and pulse 66 done at 7am, and then 188/56 and pulse 47 last night around 7pm, and on 7/24 in the evening 80/49 and pulse 62, and on 7/23 about noon 156/93 pulse 47. She states that Barry Mcdaniel has been a little off balance and he is not eating well due to his dentures. She states that the Victoza has made him really sick and he is no longer taking. She also states that his blood sugar is running higher, in the 200's due to patient eating sweets because they are easier to eat.

## 2016-10-02 NOTE — Telephone Encounter (Signed)
Patient's wife calling in reference to patient's pulse rate being in the upper 40's.  Please call patient's wife and advise. OK to leave message if no answer.

## 2016-10-02 NOTE — Progress Notes (Signed)
Cardiology Office Note Date:  10/03/2016  Patient ID:  Barry HAQ Sr., DOB 03-Dec-1932, MRN [id number] PCP:  [name], MD  Cardiologist:  Dr. [name]   Chief Complaint: variable HR/BP  History of Present Illness: Barry HOCKETT Sr. is a 83 y.o. male with history of HLD, DM, HTN, OSA, PVD, CAD s/p CABG x5 in Sep 2017 w/post op AFlutter.   He felt poorly in AFlutter had TEE/DCCV Oct 2017.  He comes in today to be seen for Dr. [name], last seen by him in April, at that time holding sr, with c/o arthritic pains and chronic resp/bronchitic issues and referred to cardiac rehab.  The patient comes accompanied by his wife who called and made the appointment with her concerns that his HR was slow at times as well as his BP low.  She brings a list of readings, generally BP look OK 110-120/80's, some are higher and lower, HR 40's-70s, weight has been stable.  The patient was surprised to know that his wife had called and made the appointment, he thought today was a routine visit.  The patient reports that since his MI/CABG in September he hasn't gotten back to himeself.  He had a hospitalization in may with a pneumonia bacteremia that set him back as well. He denies any CP or palpitations, he denies any near syncope or syncope.  He feels like his exertional capacity is "fine" and does not have difficulty with ADLs, chronic aches/pains limit his ability to exercise.  He denies nighttime symptoms, no PND or orthopnea.  He says he has good days and bad days, the bad days he attributes to long standing chronic body aches/pains from his years playing football.  He is off lisinopril with reports of low BP by his PMD a few weeks ago, maybe a month.  He reports compliance with his medicines, denies any bleeding or signs of bleeding.  AFlutter Hx: 1st diagnosed post-op CABG Sept 2017 AAD: no TEE/DCCV 01/05/16  Records report trifascicular block and 1st degree and Mobitz 1  Past Medical History:  Diagnosis  Date  . Anemia    hx of year ago no problems now per pt   . Arthritis    generalized   . Asthma   . Atypical chest pain   . Bladder neck contracture   . Chronic back pain   . Complication of anesthesia    POST OP HYPOTENSION  . Dementia    per wife pt had neurological evaluation  . Diverticulosis of colon   . First degree heart block   . GERD (gastroesophageal reflux disease)   . Heart murmur   . History of basal cell carcinoma excision    forehead and x6 area forearms  . History of Bell's palsy    x2  1998 &  2004 left side --  residual slight left eye droop and vace  . History of chronic bronchitis   . History of colon polyps   . History of gout   . History of hypertension   . History of MI (myocardial infarction)    08/ 2010  secondary acute renal failure/ dehydration--  medical management  . History of pulmonary embolus (PE)    2010  . Hypogonadism male   . Hypotension   . Hypothyroidism   . OSA (obstructive sleep apnea)    severe per study 2007/  non-compliant cpap  . Peripheral vascular disease ([place])   . Prostate carcinoma [facility]) urologist-  dr Gaynelle Arabian  DX 2007  Gleason 6  and recurrent 2012  s/p  cryoablation of prostate both times  . RBBB (right bundle branch block)   . Recovering alcoholic in remission ([place])   . Renal calculus, bilateral   . Type 2 diabetes mellitus ([place])     Past Surgical History:  Procedure Laterality Date  . CARDIAC CATHETERIZATION N/A 11/27/2015   Procedure: Left Heart Cath and Coronary Angiography;  Surgeon: Peter M [name], MD;  Location: [facility];  Service: Cardiovascular;  Laterality: N/A;  . CARDIOVASCULAR STRESS TEST  05-19-2012  dr Tressia Miners turner   normal perfusionstudy/  no ischemia/  attenuation artifact inferoapex region of myocardium/  note gate secondary to rhythm irregularity  . CARDIOVERSION N/A 01/05/2016   Procedure: CARDIOVERSION;  Surgeon: Sanda Klein, MD;  Location: [place];  Service: Cardiovascular;   Laterality: N/A;  . CATARACT EXTRACTION W/ INTRAOCULAR LENS IMPLANT Right 2014  . COLONOSCOPY W/ POLYPECTOMY  last one 08-10-2013  . CORONARY ARTERY BYPASS GRAFT N/A 11/29/2015   Procedure: CORONARY ARTERY BYPASS GRAFTING (CABG), ON PUMP, TIMES FIVE, USING LEFT INTERNAL MAMMARY ARTERY, RIGHT GREATER SAPHENOUS VEIN HARVESTED ENDOSCOPICALLY;  Surgeon: [name], MD;  Location: [place];  Service: Open Heart Surgery;  Laterality: N/A;  LIMA-LAD; SEQ SVG-OM1-OM2;SVG-DIAG; SVG-PL  . CYSTOSCOPY WITH URETHRAL DILATATION N/A 01/14/2014   Procedure: URETHRAL MEATAL DILATATION;  Surgeon: Ailene Rud, MD;  Location: [facility];  Service: Urology;  Laterality: N/A;  . EXCISION BENIGN SKIN LESION OF BACK  10/ 2011  . HIATAL HERNIA REPAIR  1983  . INGUINAL HERNIA REPAIR Right 10-14-2001  . INTRAOPERATIVE TRANSESOPHAGEAL ECHOCARDIOGRAM N/A 11/29/2015   Procedure: INTRAOPERATIVE TRANSESOPHAGEAL ECHOCARDIOGRAM;  Surgeon: [name], MD;  Location: [facility];  Service: Open Heart Surgery;  Laterality: N/A;  . KNEE ARTHROSCOPY W/ MENISCECTOMY Right 01-25-2003   and chondroplasty  . [place]  . PROSTATE CRYOABLATION  09-17-2010  &  04-12-2005  . REPAIR RIGHT ARM TENDON INJURY  1975  . SHOULDER OPEN ROTATOR CUFF REPAIR  03/20/2011   Procedure: ROTATOR CUFF REPAIR SHOULDER OPEN;  Surgeon: Tobi Bastos;  Location: [facility];  Service: Orthopedics;  Laterality: Right;  . TEE WITHOUT CARDIOVERSION N/A 01/05/2016   Procedure: TRANSESOPHAGEAL ECHOCARDIOGRAM (TEE);  Surgeon: Sanda Klein, MD;  Location: [place];  Service: Cardiovascular;  Laterality: N/A;  . TEE WITHOUT CARDIOVERSION N/A 07/30/2016   Procedure: TRANSESOPHAGEAL ECHOCARDIOGRAM (TEE);  Surgeon: Skeet Latch, MD;  Location: [place];  Service: Cardiovascular;  Laterality: N/A;  . TONSILLECTOMY  as child  . TRANSTHORACIC ECHOCARDIOGRAM  01-11-2013  dr [name]   moderate LVH/  grade I diastolic  dysfunction/ ef 55-65%/  mild AV stenosis /  mild MR/  mild LAE  . TRANSURETHRAL RESECTION OF BLADDER TUMOR N/A 01/14/2014   Procedure: TRANSURETHRAL INCISION OF BLADDER NECK CONTRACTURE;  Surgeon: Ailene Rud, MD;  Location: [facility];  Service: Urology;  Laterality: N/A;  . Vantus Implant     Hormones for Prostate  . VENTRAL HERNIA REPAIR  1998    Current Outpatient Prescriptions  Medication Sig Dispense Refill  . acetaminophen (TYLENOL) 500 MG tablet Take 500 mg by mouth every 8 (eight) hours as needed for mild pain, moderate pain or headache.     . albuterol (PROVENTIL HFA;VENTOLIN HFA) 108 (90 Base) MCG/ACT inhaler Inhale 2 puffs into the lungs every 6 (six) hours as needed for wheezing or shortness of breath. 6.7 g 1  .  apixaban (ELIQUIS) 5 MG TABS tablet Take 1 tablet (5 mg total) by mouth 2 (two) times daily. 60 tablet 5  . aspirin EC 81 MG tablet Take 81 mg by mouth daily.     . budesonide-formoterol (SYMBICORT) 160-4.5 MCG/ACT inhaler Inhale 2 puffs into the lungs 2 (two) times daily. 3 Inhaler 3  . ferrous sulfate 325 (65 FE) MG EC tablet Take 325 mg by mouth daily with supper.     . finasteride (PROSCAR) 5 MG tablet Take 5 mg by mouth daily.     Marland Kitchen gabapentin (NEURONTIN) 300 MG capsule TAKE ONE CAPSULE BY MOUTH 4 TIMES DAILY (Patient taking differently: TAKE ONE CAPSULE BY MOUTH TWICE DAILY - MAY TAKE 2 ADDITIONAL CAPSULES DURING THE DAY AS NEEDED FOR PAIN - MAX 4 CAPSULES DAILY) 120 capsule 3  . HUMALOG MIX 75/25 (75-25) 100 UNIT/ML SUSP injection Take 30 units subcutaneously daily with breakfast and 26 units with supper 10 mL 1  . insulin lispro (HUMALOG KWIKPEN) 100 UNIT/ML KiwkPen If blood sugar is 250+, take 10 units, if blood sugar is over 350, take 15 units. Dx code E11.9 15 mL 2  . levothyroxine (SYNTHROID, LEVOTHROID) 112 MCG tablet TAKE 1 TABLET BY MOUTH ONCE DAILY 90 tablet 0  . menthol-thymol (ABSORBINE JR) LIQD Apply 1 application topically 3  (three) times daily as needed (pain). For pain.    . nitroGLYCERIN (NITROSTAT) 0.4 MG SL tablet PLACE ONE TABLET UNDER THE TONGUE EVERY 5 MINUTES AS NEEDED FOR CHEST PAIN 25 tablet 3  . oxyCODONE-acetaminophen (ROXICET) 5-325 MG tablet Take 1 tablet by mouth every 8 (eight) hours as needed for severe pain. 60 tablet 0  . Polyethyl Glycol-Propyl Glycol (SYSTANE) 0.4-0.3 % SOLN Apply 1-2 drops to eye 3 (three) times daily as needed (for dry eyes).    . polyethylene glycol (MIRALAX / GLYCOLAX) packet Take 17 g by mouth daily as needed (constipation). Mix in 8 oz liquid and drink    . rosuvastatin (CRESTOR) 10 MG tablet Take 1 tablet (10 mg total) by mouth daily. 90 tablet 2  . triamcinolone (NASACORT AQ) 55 MCG/ACT AERO nasal inhaler Place 2 sprays into the nose daily.     Marland Kitchen VICTOZA 18 MG/3ML SOPN Inject 0.2 mLs (1.2 mg total) into the skin daily. Inject once daily at the same time 2 pen 3  . vitamin B-12 (CYANOCOBALAMIN) 1000 MCG tablet Take 1,000 mcg by mouth daily.     No current facility-administered medications for this visit.     Allergies:   Penicillins and Prednisone   Social History:  The patient  reports that he has never smoked. He has never used smokeless tobacco. He reports that he does not drink alcohol or use drugs.   Family History:  The patient's family history includes Cancer in his brother, brother, brother, brother, brother, brother, and sister; Diabetes in his sister and sister; Heart disease in his mother; Stroke in his brother and father.  ROS:  Please see the history of present illness.  All other systems are reviewed and otherwise negative.   PHYSICAL EXAM:  VS:  BP (!) 148/68   Pulse 68   Ht 5\' 11"  (1.803 m)   Wt 187 lb (84.8 kg)   BMI 26.08 kg/m  BMI: Body mass index is 26.08 kg/m. Well nourished, well developed, in no acute distress  HEENT: normocephalic, atraumatic  Neck: no JVD, carotid bruits or masses Cardiac:  RRR; 1/6 SM, no rubs, or gallops Lungs:   CTA b/l ,  no wheezing, rhonchi or rales  Abd: soft, nontender MS: no deformity, age appropriate atrophy Ext: no edema  Skin: warm and dry, no rash Neuro:  No gross deficits appreciated Psych: euthymic mood, full affect   EKG:  Done today and reviewed by myself with DOD shows SR, no clear AV dissociation, RBBB, LAD, rate is 68BPM In review of older EKG's, he has had the same looking EKG going back to Oct 2017, one EKG has noted "reviewed with Dr. Caryl Comes, Mobitz I"  07/30/16: TEE LVEF 55-60% Mild MR Moderate mitral annular calcification Moderate AS.  Mean gradient 25 mmHg.  Severe calcification and restricted movement of the right and non-coronary cusps.   No LA/LAA thrombus or vegetation. No evidence of endocarditis.  Left Heart Cath and Coronary Angiography by Dr. [name] on 11/27/2015:  Conclusion     Ost LM to LM lesion, 80 %stenosed.  Prox LAD lesion, 80 %stenosed.  Mid LAD lesion, 90 %stenosed.  Dist LAD lesion, 95 %stenosed.  Ost 1st Diag to 1st Diag lesion, 100 %stenosed.  Ost Ramus lesion, 90 %stenosed.  Ost Cx to Prox Cx lesion, 90 %stenosed.  Ost 1st Mrg to 1st Mrg lesion, 80 %stenosed.  Prox Cx to Mid Cx lesion, 100 %stenosed.  Prox RCA lesion, 95 %stenosed.  Dist RCA lesion, 90 %stenosed.  1. Critical 3 vessel and left main CAD 2. Mildly elevated LV EDP 3. No significant AV gradient by pullback.    Coronary artery bypass grafting x5with left internal mammary to the left anterior descending coronary artery reverse saphenous vein graft to the diagonal coronary artery, sequential reverse saphenous vein graft to the first and second obtuse marginal reverse saphenous vein graft to the posterior lateral branch of the right coronary artery with right thigh and calf greater saphenous vein harvesting endoscopically by Dr. Servando Snare on 11/29/2015.   2D ECHO: 11/28/2015 LV EF: 45% - 50% Study Conclusions - Left ventricle: The cavity size was  normal. There was severe concentric hypertrophy. Systolic function was mildly reduced. The estimated ejection fraction was in the range of 45% to 50%. Hypokinesis of the apical, mid-apical anteroseptal and inferoseptal myocardium, and apical inferolateral, and inferior myocardium. Doppler parameters are consistent with a reversible restrictive pattern, indicative of decreased left ventricular diastolic compliance and/or increased left atrial pressure (grade 3 diastolic dysfunction). - Aortic valve: Trileaflet; moderately thickened, moderately calcified leaflets. Valve mobility was restricted. There was mild stenosis. There was no regurgitation. Valve area (VTI): 0.88 cm^2. Valve area (Vmax): 0.96 cm^2. Valve area (Vmean): 0.86 cm^2. - Mitral valve: Calcified annulus. Transvalvular velocity was within the normal range. There was no evidence for stenosis. There was mild regurgitation. - Left atrium: The atrium was severely dilated. - Right ventricle: The cavity size was normal. Wall thickness was normal. Systolic function was normal. - Atrial septum: A patent foramen ovale cannot be excluded. - Tricuspid valve: There was mild regurgitation. Impressions: - Aortic valve appears heavily calcified and thickend. There appears to be mild aortic stenosis by peak velocity and mean gradients. However, given reduced systolic function, cannot rule out more severe valvular heart disease or &quot;low flow, low gradient&quot; aortic stenosis.    Recent Labs: 11/25/2015: B Natriuretic Peptide 1,139.9 11/30/2015: Magnesium 2.2 05/30/2016: TSH 1.39 07/19/2016: Platelets 152 07/30/2016: Hemoglobin 10.2 08/26/2016: ALT 8; BUN 30; Creatinine, Ser 1.42; Potassium 4.9; Sodium 142  10/31/2015: Cholesterol 117; HDL 44.60; LDL Cholesterol 53; Total CHOL/HDL Ratio 3; Triglycerides 98.0; VLDL 19.6   CrCl cannot be calculated (Patient's most recent lab  result is older than  the maximum 21 days allowed.).   Wt Readings from Last 3 Encounters:  10/03/16 187 lb (84.8 kg)  08/29/16 187 lb (84.8 kg)  07/19/16 169 lb 15.6 oz (77.1 kg)     Other studies reviewed: Additional studies/records reviewed today include: summarized above  ASSESSMENT AND PLAN:  1. PAFlutter     CHA2DS2Vasc is 6, on Eliquis (appropriately dosed at 5mg )     Baseline conduction system disease     07/19/16, H/H stable, 6/181/8 Creat 1.42  2. CAD     No anginal symptoms     On ASA, statin, no BB with baseline conduction system disease      3. HTN     Off his ACE reportedly 2/2 low BP by PMD     Wife reports concerns of low BP at home as well, home numbers are normotensive generally     Will have an RN visit for BP check with his machine as well   I discussed with the patient recommendation of holter monitoring given his conduction system disease and his wife's observations of slow HR/low BP and her opinion that he appears shaky/weak with these, and EP consult.  At this time the patient is firm, he does not want to pursue monitoring or EP consult, he says Dr. [name] has mentioned he may need a pacer at some point, but he does not feel like he feels poorly or is havng any particular symptoms.  (He is not having any near syncope or syncope).  He would prefer to see Dr. [name] and take a watchful waiting strategy at this time.  We discussed symptoms of bradycardia to make [place] aware if any and to seek attention if needed       Disposition: F/u with Dr. [name] at his next available.  Current medicines are reviewed at length with the patient today.  The patient did not have any concerns regarding medicines.  Haywood Lasso, PA-C 10/03/2016 9:09 AM     [address] [place]  [address] [phone] (office)  [phone] (fax)

## 2016-10-02 NOTE — Telephone Encounter (Signed)
Spoke to patient's wife, call transferred into triage.  Patient's wife reports today's BP and HR are stable : 148/77, 66, last evening 118/56, 47.  She started noticing this on 09/19/16.   He denies dizziness, appears to be trembling at times and his gait gets unsteady when his HR is low.  Denies syncope or presyncope.   Reviewed medications.  Pt not taking lisinopril anymore.  He takes oxycodone as needed and she thinks it is closer together than every 8 hours.    Pt has contacted PCP today who recommends f/u with cardiology.     Scheduled patient with PA tomorrow am.

## 2016-10-02 NOTE — Telephone Encounter (Signed)
Called patient and she stated that Dr. [name] is doing EKG tomorrow morning at 8am. Just an FYI.

## 2016-10-02 NOTE — Telephone Encounter (Signed)
New message     J  Pt c/o BP issue: STAT if pt c/o blurred vision, one-sided weakness or slurred speech  1. What are your last 5 BP readings?  recenbptly  148/ 77 hr 66   118/56 hr 47  80/49 hr  62  156/93 47  110/67 hr 61 (7/22)  (7/21) 108/62 49   2. Are you having any other symptoms (ex. Dizziness, headache, blurred vision, passed out)?   Unsteady on feet   3. What is your BP issue?  ack's heart rate is dropping into the 40's , and he gets very unsteady on his feet with it drops down low.   Pt stopped the lisinopril the last time he was in office, due to low bp.

## 2016-10-03 ENCOUNTER — Ambulatory Visit (INDEPENDENT_AMBULATORY_CARE_PROVIDER_SITE_OTHER): Payer: Medicare Other | Admitting: Physician Assistant

## 2016-10-03 VITALS — BP 148/68 | HR 68 | Ht 71.0 in | Wt 187.0 lb

## 2016-10-03 DIAGNOSIS — I10 Essential (primary) hypertension: Secondary | ICD-10-CM | POA: Diagnosis not present

## 2016-10-03 DIAGNOSIS — I480 Paroxysmal atrial fibrillation: Secondary | ICD-10-CM

## 2016-10-03 DIAGNOSIS — I2510 Atherosclerotic heart disease of native coronary artery without angina pectoris: Secondary | ICD-10-CM | POA: Diagnosis not present

## 2016-10-03 NOTE — Patient Instructions (Addendum)
Medication Instructions:   Your physician recommends that you continue on your current medications as directed. Please refer to the Current Medication list given to you today.   If you need a refill on your cardiac medications before your next appointment, please call your pharmacy.  Labwork: NONE ORDERED  TODAY    Testing/Procedures: NONE ORDERED  TODAY    Follow-Up:  NURSE VISIT NEXT COUPLE OF WEEKS WITH BLOOD PRESSURE CHECK   BEFORE DR [name] APPOINTMENT    WITH DR [name]  AS PLANNED   Any Other Special Instructions Will Be Listed Below (If Applicable).

## 2016-10-07 ENCOUNTER — Other Ambulatory Visit (INDEPENDENT_AMBULATORY_CARE_PROVIDER_SITE_OTHER): Payer: Medicare Other

## 2016-10-07 ENCOUNTER — Other Ambulatory Visit: Payer: Self-pay | Admitting: Cardiovascular Disease

## 2016-10-07 DIAGNOSIS — Z794 Long term (current) use of insulin: Secondary | ICD-10-CM | POA: Diagnosis not present

## 2016-10-07 DIAGNOSIS — E1165 Type 2 diabetes mellitus with hyperglycemia: Secondary | ICD-10-CM

## 2016-10-07 DIAGNOSIS — D508 Other iron deficiency anemias: Secondary | ICD-10-CM

## 2016-10-07 LAB — BASIC METABOLIC PANEL
BUN: 24 mg/dL — AB (ref 6–23)
CO2: 25 mEq/L (ref 19–32)
CREATININE: 1.22 mg/dL (ref 0.40–1.50)
Calcium: 9 mg/dL (ref 8.4–10.5)
Chloride: 109 mEq/L (ref 96–112)
GFR: 60.5 mL/min (ref >60.00)
GLUCOSE: 87 mg/dL (ref 70–99)
POTASSIUM: 4.1 meq/L (ref 3.5–5.1)
Sodium: 142 mEq/L (ref 135–145)

## 2016-10-07 LAB — CBC
HEMATOCRIT: 35 % — AB (ref 39.0–52.0)
HEMOGLOBIN: 11 g/dL — AB (ref 13.0–17.0)
MCHC: 31.4 g/dL (ref 30.0–36.0)
MCV: 90.6 fl (ref 78.0–100.0)
Platelets: 150 10*3/uL (ref 150.0–400.0)
RBC: 3.86 Mil/uL — ABNORMAL LOW (ref 4.22–5.81)
RDW: 16.1 % — AB (ref 11.5–15.5)
WBC: 7.8 10*3/uL (ref 4.0–10.5)

## 2016-10-07 LAB — TSH: TSH: 0.07 u[IU]/mL — AB (ref 0.35–4.50)

## 2016-10-08 LAB — FRUCTOSAMINE: Fructosamine: 261 umol/L (ref 0–285)

## 2016-10-10 ENCOUNTER — Ambulatory Visit (INDEPENDENT_AMBULATORY_CARE_PROVIDER_SITE_OTHER): Payer: Medicare Other | Admitting: Endocrinology

## 2016-10-10 ENCOUNTER — Encounter: Payer: Self-pay | Admitting: Endocrinology

## 2016-10-10 VITALS — BP 124/62 | HR 69 | Ht 71.0 in | Wt 191.0 lb

## 2016-10-10 DIAGNOSIS — E1165 Type 2 diabetes mellitus with hyperglycemia: Secondary | ICD-10-CM | POA: Diagnosis not present

## 2016-10-10 DIAGNOSIS — I2510 Atherosclerotic heart disease of native coronary artery without angina pectoris: Secondary | ICD-10-CM | POA: Diagnosis not present

## 2016-10-10 DIAGNOSIS — D508 Other iron deficiency anemias: Secondary | ICD-10-CM | POA: Diagnosis not present

## 2016-10-10 DIAGNOSIS — E063 Autoimmune thyroiditis: Secondary | ICD-10-CM | POA: Diagnosis not present

## 2016-10-10 DIAGNOSIS — Z794 Long term (current) use of insulin: Secondary | ICD-10-CM

## 2016-10-10 NOTE — Patient Instructions (Signed)
Skip thyroid 1x weekly  Less sweets

## 2016-10-10 NOTE — Progress Notes (Signed)
Patient ID: Barry Officer Sr., male   DOB: 06-30-1932, 83 y.o.   MRN: [id number]   Reason for Appointment: follow-up  of various issues   History of Present Illness   Diagnosis: Type 2 diabetes mellitus, date of diagnosis: 1997.   He has been on basal bolus insulin regimen for a few years.  His blood sugars are usually variably controlled and A1c is usually over 8%  except once was 7.2.  Previously had tried Victoza with some benefit but did not continue this because of perceived side effect of constipation and also out-of-pocket expense  RECENT history:   Insulin regimen: Toujeo insulin 20 units in am  Humalog mix insulin with vial 26 units-26 acs   He has had inconsistent control of his diabetes since at least late 2015 Although his blood sugars improved  after changing Humalog to Humalog mix insulin before each meal in 2/16 he has had labile blood sugars  He still has poor control with A1c going up to 7.9 and previously 6.8  His blood sugars are showing the following patterns and has the following problems identified:  He was tried on Victoza on his last visit because of poor control of his blood sugars, increase variability in his difficulty in controlling his diet  However he claims that it made him nauseated with the 0.6 dose and he did not take it for more than a day or 2; he refuses to try it again   FASTING blood sugars are still somewhat variable although mostly high  Blood sugars are on an average much higher in the afternoons and before supper and variably high later in the evening  HIGHEST blood sugars are higher after supper  He supposed to take extra Humalog when his blood sugars are over 250 which may sometimes help but is not doing this very much  He is not watching his snacks or sweets consistently and may also have some cookies or ice cream at any time of the day or night  No hypoglycemia  Monitors blood glucose: 2 times  a day.    Glucometer:  One Touch.  Glucose readings  from download  Mean values apply above for all meters except median for One Touch  PRE-MEAL Fasting Lunch Dinner Bedtime Overall  Glucose range: 93-271    91-489  91-489   Mean/median: 181  183  253  216      Dietician visit: Most recent:1/14.  CDE visit 10/16   Exercise: a little walking  Wt Readings from Last 3 Encounters:  10/10/16 191 lb (86.6 kg)  10/03/16 187 lb (84.8 kg)  08/29/16 187 lb (84.8 kg)   Lab Results  Component Value Date   HGBA1C 7.9 (H) 08/26/2016   HGBA1C 6.8 (H) 03/22/2016   HGBA1C 7.0 (H) 11/28/2015   Lab Results  Component Value Date   MICROALBUR 8.2 (H) 12/15/2015   LDLCALC 53 10/31/2015   CREATININE 1.22 10/07/2016    Other ACTIVE problems are discussed in review of systems    Lab on 10/07/2016  Component Date Value Ref Range Status  . Sodium 10/07/2016 142  135 - 145 mEq/L Final  . Potassium 10/07/2016 4.1  3.5 - 5.1 mEq/L Final  . Chloride 10/07/2016 109  96 - 112 mEq/L Final  . CO2 10/07/2016 25  19 - 32 mEq/L Final  . Glucose, Bld 10/07/2016 87  70 - 99 mg/dL Final  . BUN 10/07/2016 24*  6 - 23 mg/dL Final  . Creatinine, Ser 10/07/2016 1.22  0.40 - 1.50 mg/dL Final  . Calcium 10/07/2016 9.0  8.4 - 10.5 mg/dL Final  . GFR 10/07/2016 60.50  >60.00 mL/min Final  . Fructosamine 10/07/2016 261  0 - 285 umol/L Final   Comment: Published reference interval for apparently healthy subjects between age 67 and 63 is 30 - 285 umol/L and in a poorly controlled diabetic population is 228 - 563 umol/L with a mean of 396 umol/L.   Marland Kitchen WBC 10/07/2016 7.8  4.0 - 10.5 K/uL Final  . RBC 10/07/2016 3.86* 4.22 - 5.81 Mil/uL Final  . Platelets 10/07/2016 150.0  150.0 - 400.0 K/uL Final  . Hemoglobin 10/07/2016 11.0* 13.0 - 17.0 g/dL Final  . HCT 10/07/2016 35.0* 39.0 - 52.0 % Final  . MCV 10/07/2016 90.6  78.0 - 100.0 fl Final  . MCHC 10/07/2016 31.4  30.0 - 36.0 g/dL Final  . RDW 10/07/2016 16.1* 11.5 - 15.5  % Final  . TSH 10/07/2016 0.07* 0.35 - 4.50 uIU/mL Final    OTHER active problems addressed today are reviewed in review of systems   Allergies as of 10/10/2016      Reactions   Penicillins Swelling   Eyes and lips Has patient had a PCN reaction causing immediate rash, facial/tongue/throat swelling, SOB or lightheadedness with hypotension: Yes Has patient had a PCN reaction causing severe rash involving mucus membranes or skin necrosis: No Has patient had a PCN reaction that required hospitalization No Has patient had a PCN reaction occurring within the last 10 years: No If all of the above answers are "NO", then may proceed with Cephalosporin use   Prednisone Other (See Comments)   Hyperglycemia and AMS      Medication List       Accurate as of 10/10/16  1:17 PM. Always use your most recent med list.          acetaminophen 500 MG tablet Commonly known as:  TYLENOL Take 500 mg by mouth every 8 (eight) hours as needed for mild pain, moderate pain or headache.   albuterol 108 (90 Base) MCG/ACT inhaler Commonly known as:  PROVENTIL HFA;VENTOLIN HFA Inhale 2 puffs into the lungs every 6 (six) hours as needed for wheezing or shortness of breath.   aspirin EC 81 MG tablet Take 81 mg by mouth daily.   budesonide-formoterol 160-4.5 MCG/ACT inhaler Commonly known as:  SYMBICORT Inhale 2 puffs into the lungs 2 (two) times daily.   ELIQUIS 5 MG Tabs tablet Generic drug:  apixaban TAKE ONE TABLET BY MOUTH TWICE DAILY   ferrous sulfate 325 (65 FE) MG EC tablet Take 325 mg by mouth daily with supper.   finasteride 5 MG tablet Commonly known as:  PROSCAR Take 5 mg by mouth daily.   gabapentin 300 MG capsule Commonly known as:  NEURONTIN TAKE ONE CAPSULE BY MOUTH 4 TIMES DAILY   HUMALOG MIX 75/25 (75-25) 100 UNIT/ML Susp injection Generic drug:  insulin lispro protamine-lispro Take 30 units subcutaneously daily with breakfast and 26 units with supper   insulin lispro 100  UNIT/ML KiwkPen Commonly known as:  HUMALOG KWIKPEN If blood sugar is 250+, take 10 units, if blood sugar is over 350, take 15 units. Dx code E11.9   levothyroxine 112 MCG tablet Commonly known as:  SYNTHROID, LEVOTHROID TAKE 1 TABLET BY MOUTH ONCE DAILY   menthol-thymol Liqd Apply 1 application topically 3 (three) times daily as needed (pain). For pain.  NASACORT AQ 55 MCG/ACT Aero nasal inhaler Generic drug:  triamcinolone Place 2 sprays into the nose daily.   nitroGLYCERIN 0.4 MG SL tablet Commonly known as:  NITROSTAT PLACE ONE TABLET UNDER THE TONGUE EVERY 5 MINUTES AS NEEDED FOR CHEST PAIN   oxyCODONE-acetaminophen 5-325 MG tablet Commonly known as:  ROXICET Take 1 tablet by mouth every 8 (eight) hours as needed for severe pain.   polyethylene glycol packet Commonly known as:  MIRALAX / GLYCOLAX Take 17 g by mouth daily as needed (constipation). Mix in 8 oz liquid and drink   rosuvastatin 10 MG tablet Commonly known as:  CRESTOR Take 1 tablet (10 mg total) by mouth daily.   SYSTANE 0.4-0.3 % Soln Generic drug:  Polyethyl Glycol-Propyl Glycol Apply 1-2 drops to eye 3 (three) times daily as needed (for dry eyes).   TOUJEO SOLOSTAR 300 UNIT/ML Sopn Generic drug:  Insulin Glargine Inject 20 units in the morning   vitamin B-12 1000 MCG tablet Commonly known as:  CYANOCOBALAMIN Take 1,000 mcg by mouth daily.       Allergies:  Allergies  Allergen Reactions  . Penicillins Swelling    Eyes and lips Has patient had a PCN reaction causing immediate rash, facial/tongue/throat swelling, SOB or lightheadedness with hypotension: Yes Has patient had a PCN reaction causing severe rash involving mucus membranes or skin necrosis: No Has patient had a PCN reaction that required hospitalization No Has patient had a PCN reaction occurring within the last 10 years: No If all of the above answers are "NO", then may proceed with Cephalosporin use   . Prednisone Other (See  Comments)    Hyperglycemia and AMS    Past Medical History:  Diagnosis Date  . Anemia    hx of year ago no problems now per pt   . Arthritis    generalized   . Asthma   . Atypical chest pain   . Bladder neck contracture   . Chronic back pain   . Complication of anesthesia    POST OP HYPOTENSION  . Dementia    per wife pt had neurological evaluation  . Diverticulosis of colon   . First degree heart block   . GERD (gastroesophageal reflux disease)   . Heart murmur   . History of basal cell carcinoma excision    forehead and x6 area forearms  . History of Bell's palsy    x2  1998 &  2004 left side --  residual slight left eye droop and vace  . History of chronic bronchitis   . History of colon polyps   . History of gout   . History of hypertension   . History of MI (myocardial infarction)    08/ 2010  secondary acute renal failure/ dehydration--  medical management  . History of pulmonary embolus (PE)    2010  . Hypogonadism male   . Hypotension   . Hypothyroidism   . OSA (obstructive sleep apnea)    severe per study 2007/  non-compliant cpap  . Peripheral vascular disease ([place])   . Prostate carcinoma [facility]) urologist-  dr Gaynelle Arabian   DX 2007  Gleason 6  and recurrent 2012  s/p  cryoablation of prostate both times  . RBBB (right bundle branch block)   . Recovering alcoholic in remission ([place])   . Renal calculus, bilateral   . Type 2 diabetes mellitus ([place])     Past Surgical History:  Procedure Laterality Date  . CARDIAC CATHETERIZATION N/A 11/27/2015  Procedure: Left Heart Cath and Coronary Angiography;  Surgeon: Peter M [name], MD;  Location: [facility];  Service: Cardiovascular;  Laterality: N/A;  . CARDIOVASCULAR STRESS TEST  05-19-2012  dr Tressia Miners turner   normal perfusionstudy/  no ischemia/  attenuation artifact inferoapex region of myocardium/  note gate secondary to rhythm irregularity  . CARDIOVERSION N/A 01/05/2016   Procedure: CARDIOVERSION;   Surgeon: Sanda Klein, MD;  Location: [place];  Service: Cardiovascular;  Laterality: N/A;  . CATARACT EXTRACTION W/ INTRAOCULAR LENS IMPLANT Right 2014  . COLONOSCOPY W/ POLYPECTOMY  last one 08-10-2013  . CORONARY ARTERY BYPASS GRAFT N/A 11/29/2015   Procedure: CORONARY ARTERY BYPASS GRAFTING (CABG), ON PUMP, TIMES FIVE, USING LEFT INTERNAL MAMMARY ARTERY, RIGHT GREATER SAPHENOUS VEIN HARVESTED ENDOSCOPICALLY;  Surgeon: [name], MD;  Location: [place];  Service: Open Heart Surgery;  Laterality: N/A;  LIMA-LAD; SEQ SVG-OM1-OM2;SVG-DIAG; SVG-PL  . CYSTOSCOPY WITH URETHRAL DILATATION N/A 01/14/2014   Procedure: URETHRAL MEATAL DILATATION;  Surgeon: Ailene Rud, MD;  Location: [facility];  Service: Urology;  Laterality: N/A;  . EXCISION BENIGN SKIN LESION OF BACK  10/ 2011  . HIATAL HERNIA REPAIR  1983  . INGUINAL HERNIA REPAIR Right 10-14-2001  . INTRAOPERATIVE TRANSESOPHAGEAL ECHOCARDIOGRAM N/A 11/29/2015   Procedure: INTRAOPERATIVE TRANSESOPHAGEAL ECHOCARDIOGRAM;  Surgeon: [name], MD;  Location: [place];  Service: Open Heart Surgery;  Laterality: N/A;  . KNEE ARTHROSCOPY W/ MENISCECTOMY Right 01-25-2003   and chondroplasty  . [place]  . PROSTATE CRYOABLATION  09-17-2010  &  04-12-2005  . REPAIR RIGHT ARM TENDON INJURY  1975  . SHOULDER OPEN ROTATOR CUFF REPAIR  03/20/2011   Procedure: ROTATOR CUFF REPAIR SHOULDER OPEN;  Surgeon: Tobi Bastos;  Location: [facility];  Service: Orthopedics;  Laterality: Right;  . TEE WITHOUT CARDIOVERSION N/A 01/05/2016   Procedure: TRANSESOPHAGEAL ECHOCARDIOGRAM (TEE);  Surgeon: Sanda Klein, MD;  Location: [place];  Service: Cardiovascular;  Laterality: N/A;  . TEE WITHOUT CARDIOVERSION N/A 07/30/2016   Procedure: TRANSESOPHAGEAL ECHOCARDIOGRAM (TEE);  Surgeon: Skeet Latch, MD;  Location: [place];  Service: Cardiovascular;  Laterality: N/A;  . TONSILLECTOMY  as child  .  TRANSTHORACIC ECHOCARDIOGRAM  01-11-2013  dr [name]   moderate LVH/  grade I diastolic dysfunction/ ef 13-08%/  mild AV stenosis /  mild MR/  mild LAE  . TRANSURETHRAL RESECTION OF BLADDER TUMOR N/A 01/14/2014   Procedure: TRANSURETHRAL INCISION OF BLADDER NECK CONTRACTURE;  Surgeon: Ailene Rud, MD;  Location: [facility];  Service: Urology;  Laterality: N/A;  . Vantus Implant     Hormones for Prostate  . VENTRAL HERNIA REPAIR  1998    Family History  Problem Relation Age of Onset  . Heart disease Mother   . Stroke Father   . Cancer Brother        unsure of type  . Cancer Sister   . Diabetes Sister   . Diabetes Sister   . Stroke Brother   . Cancer Brother   . Cancer Brother   . Cancer Brother   . Cancer Brother   . Cancer Brother     Social History:  reports that he has never smoked. He has never used smokeless tobacco. He reports that he does not drink alcohol or use drugs.  Review of Systems   He has  COPD with chronic cough, treated by the pulmonologist  He gets relief with Robitussin Generic cough syrup  However he wants to take albuterol as needed for cough and wheezing at times despite taking Symbicort regularly   HYPERLIPIDEMIA: Well controlled with Crestor, needs follow-up   Lab Results  Component Value Date   CHOL 117 10/31/2015   HDL 44.60 10/31/2015   LDLCALC 53 10/31/2015   TRIG 98.0 10/31/2015   CHOLHDL 3 10/31/2015     He was diagnosed have had sleep apnea but he Declined CPAP    HYPERTENSION: Blood pressure had been Previously treated with lisinopril However because of his blood pressure being's lower low-normal this was stopped His wife thinks that sometimes his blood pressure may be up to around 150 but not consistently Blood pressure is relatively better today   BP Readings from Last 3 Encounters:  10/10/16 124/62  10/03/16 (!) 148/68  08/29/16 100/60    History of mild hypothyroidism  This was  controlled on 112  g,Which was supposed to be 6 days a week but he is now taking this 7 days per week, his wife has been trying to manage medications but she forgot the instructions     Lab Results  Component Value Date   TSH 0.07 (L) 10/07/2016   TSH 1.39 05/30/2016   TSH 0.46 03/22/2016   FREET4 1.16 03/22/2016   FREET4 0.79 12/12/2015   FREET4 1.09 04/18/2015    ANEMIA: This is better, taking iron, Usually OTC ferrous sulfate With vitamin C   B-12 deficiency:He has been on B12 tablets 1000 mg daily   Lab Results  Component Value Date   WBC 7.8 10/07/2016   HGB 11.0 (L) 10/07/2016   HCT 35.0 (L) 10/07/2016   MCV 90.6 10/07/2016   PLT 150.0 10/07/2016      Examination:   BP 124/62   Pulse 69   Ht 5\' 11"  (1.803 m)   Wt 191 lb (86.6 kg)   SpO2 96%   BMI 26.64 kg/m   Body mass index is 26.64 kg/m.      Assesment/Plan:  Diabetes type 2, uncontrolled  See history of present illness for detailed discussion of his current management, blood sugar patterns and problems identified  Blood sugars are still poorly controlled , average blood sugar over 200  He was not able to tolerate Victoza which is unusual as he had taken as before His sugars are variable and this appears to be mostly dependent on his compliance with diet  Also recently had periodically high fasting readings if he is eating late at night or during the night  Will not change his basic regimen would try to help him understand that he needs to watch his diet better Also can add extra pre-meal Humalog consistently if blood sugars are over 200  ANEMIA:  Appears to be improving and will continue to follow-up regularly   Blood pressure: Reassured his wife that he does not need to be on a daily antihypertensives as his blood pressure is not consistently high at least in the office and she needs to bring the blood pressure monitor for comparison on each visit   Patient Instructions  Skip thyroid 1x weekly  Less sweets       Total visit time for evaluation and management of multiple problems, counseling, review of blood sugar download, labs and medical record = 25 minutes  Janijah Symons 10/10/2016, 1:17 PM

## 2016-10-16 ENCOUNTER — Telehealth: Payer: Self-pay | Admitting: Endocrinology

## 2016-10-16 DIAGNOSIS — H26493 Other secondary cataract, bilateral: Secondary | ICD-10-CM | POA: Diagnosis not present

## 2016-10-16 DIAGNOSIS — E119 Type 2 diabetes mellitus without complications: Secondary | ICD-10-CM | POA: Diagnosis not present

## 2016-10-16 DIAGNOSIS — H52221 Regular astigmatism, right eye: Secondary | ICD-10-CM | POA: Diagnosis not present

## 2016-10-16 DIAGNOSIS — H02201 Unspecified lagophthalmos right upper eyelid: Secondary | ICD-10-CM | POA: Diagnosis not present

## 2016-10-16 DIAGNOSIS — H524 Presbyopia: Secondary | ICD-10-CM | POA: Diagnosis not present

## 2016-10-16 DIAGNOSIS — H02204 Unspecified lagophthalmos left upper eyelid: Secondary | ICD-10-CM | POA: Diagnosis not present

## 2016-10-16 NOTE — Telephone Encounter (Signed)
Please advise if okay to refill. 

## 2016-10-16 NOTE — Telephone Encounter (Signed)
We can only fill 30 day prescription at a time for this

## 2016-10-16 NOTE — Telephone Encounter (Signed)
MEDICATION: oxyCODONE-acetaminophen (ROXICET) 5-325 MG tablet  PHARMACY: ENDOCRINOLOGY    IS THIS A 90 DAY SUPPLY :ENOUGH FOR 4X A DAY  IS PATIENT OUT OF MEDICTAION: YES  IF NOT; HOW MUCH IS LEFT: N/A  LAST APPOINTMENT DATE: 10/10/16  NEXT APPOINTMENT DATE:12/09/16  OTHER COMMENTS: Patient stated this was discussed at the previous appointment.   **Let patient know to contact pharmacy at the end of the day to make sure medication is ready. **  ** Please notify patient to allow 48-72 hours to process**  **Encourage patient to contact the pharmacy for refills or they can request refills through [facility]**

## 2016-10-17 ENCOUNTER — Ambulatory Visit: Payer: Medicare Other

## 2016-10-17 ENCOUNTER — Encounter: Payer: Self-pay | Admitting: Pharmacist

## 2016-10-17 ENCOUNTER — Ambulatory Visit (INDEPENDENT_AMBULATORY_CARE_PROVIDER_SITE_OTHER): Payer: Medicare Other | Admitting: Pharmacist

## 2016-10-17 ENCOUNTER — Other Ambulatory Visit: Payer: Self-pay

## 2016-10-17 VITALS — BP 122/60 | HR 77

## 2016-10-17 DIAGNOSIS — I2510 Atherosclerotic heart disease of native coronary artery without angina pectoris: Secondary | ICD-10-CM

## 2016-10-17 DIAGNOSIS — I214 Non-ST elevation (NSTEMI) myocardial infarction: Secondary | ICD-10-CM | POA: Diagnosis not present

## 2016-10-17 MED ORDER — OXYCODONE-ACETAMINOPHEN 5-325 MG PO TABS: 1.0000 | ORAL_TABLET | Freq: Three times a day (TID) | ORAL | 0 refills | Status: DC | PRN

## 2016-10-17 NOTE — Progress Notes (Signed)
Patient ID: Barry MANGIONE Sr.                 DOB: [birth year]                      MRN: [id number]     HPI: Barry Officer Sr. is a 83 y.o. male pt of Dr. [name] referred by Barry Mcdaniel to HTN clinic. PMH is significant for HLD, DM, HTN, OSA, PVD, CAD s/p CABG x5 in Sep 2017 w/post op AFlutter. He was most recently seen in Dr. Kyla Mcdaniel office on 7/26 with concerns of low HR and low BP. At the visit, pt reports home readings in the range of 110-120/80's, some are higher and lower, HR 40's-70s. BP was 148/68 at the office. He denied any CP or palpitations, he denied any near syncope or syncope.  He felt like his exertional capacity is "fine" and does not have difficulty with ADLs. Pt's lisinopril 20 mg was discontinued by his PMD given reports of low BP. Pt presents today for BP evaluation.   Pt presents to the clinic w/ wife in good spirits. Reported that he stopped lisinopril about three months ago. Home BP readings have generally been in the 100s-120s/60s-70s, sometimes SBP is in the 130s and very occasionally around 160. Pt denies dizziness, lightheadedness, headaches, or falls. He states that he does feel weakness and fatigue from time to time especially after his hospital admission due to bacteremia in May 2018. HR in clinic was 77. BP was 122/60 mmHg with office cuff and 110/60 mmHg with home cuff.   Current HTN meds: none  Previously tried: lisinopril - stopped over concerns of low BP   BP goal: <140/90 mmHg  Family History: The patient's family history includes Cancer in his brother, brother, brother, brother, brother, brother, and sister; Diabetes in his sister and sister; Heart disease in his mother; Stroke in his brother and father.  Social History: The patient  reports that he has never smoked. He has never used smokeless tobacco. He reports that he does not drink alcohol or use drugs.   Diet: Has problems with dentures, hurts when he eats. Eats soft foods. Chicken and pastries. Has  milkshakes daily and likes eggs and sausage. One cup of coffee almost every. 1.5 64 oz of soda has caffeine between him and his wife.   Exercise:  Chronic aches/pains limit his ability to exercise. Does walk around the house. Plan to exercise once in better health.   Home BP readings: see above  Wt Readings from Last 3 Encounters:  10/10/16 191 lb (86.6 kg)  10/03/16 187 lb (84.8 kg)  08/29/16 187 lb (84.8 kg)   BP Readings from Last 3 Encounters:  10/10/16 124/62  10/03/16 (!) 148/68  08/29/16 100/60   Pulse Readings from Last 3 Encounters:  10/10/16 69  10/03/16 68  08/29/16 71    Renal function: Estimated Creatinine Clearance: 50.6 mL/min (by C-G formula based on SCr of 1.22 mg/dL).  Past Medical History:  Diagnosis Date  . Anemia    hx of year ago no problems now per pt   . Arthritis    generalized   . Asthma   . Atypical chest pain   . Bladder neck contracture   . Chronic back pain   . Complication of anesthesia    POST OP HYPOTENSION  . Dementia    per wife pt had neurological evaluation  . Diverticulosis of colon   .  First degree heart block   . GERD (gastroesophageal reflux disease)   . Heart murmur   . History of basal cell carcinoma excision    forehead and x6 area forearms  . History of Bell's palsy    x2  1998 &  2004 left side --  residual slight left eye droop and vace  . History of chronic bronchitis   . History of colon polyps   . History of gout   . History of hypertension   . History of MI (myocardial infarction)    08/ 2010  secondary acute renal failure/ dehydration--  medical management  . History of pulmonary embolus (PE)    2010  . Hypogonadism male   . Hypotension   . Hypothyroidism   . OSA (obstructive sleep apnea)    severe per study 2007/  non-compliant cpap  . Peripheral vascular disease ([place])   . Prostate carcinoma [facility]) urologist-  dr Gaynelle Arabian   DX 2007  Gleason 6  and recurrent 2012  s/p  cryoablation of prostate both  times  . RBBB (right bundle branch block)   . Recovering alcoholic in remission ([place])   . Renal calculus, bilateral   . Type 2 diabetes mellitus ([place])     Current Outpatient Prescriptions on File Prior to Visit  Medication Sig Dispense Refill  . acetaminophen (TYLENOL) 500 MG tablet Take 500 mg by mouth every 8 (eight) hours as needed for mild pain, moderate pain or headache.     . albuterol (PROVENTIL HFA;VENTOLIN HFA) 108 (90 Base) MCG/ACT inhaler Inhale 2 puffs into the lungs every 6 (six) hours as needed for wheezing or shortness of breath. 6.7 g 1  . aspirin EC 81 MG tablet Take 81 mg by mouth daily.     . budesonide-formoterol (SYMBICORT) 160-4.5 MCG/ACT inhaler Inhale 2 puffs into the lungs 2 (two) times daily. 3 Inhaler 3  . ELIQUIS 5 MG TABS tablet TAKE ONE TABLET BY MOUTH TWICE DAILY 60 tablet 6  . ferrous sulfate 325 (65 FE) MG EC tablet Take 325 mg by mouth daily with supper.     . finasteride (PROSCAR) 5 MG tablet Take 5 mg by mouth daily.     Marland Kitchen gabapentin (NEURONTIN) 300 MG capsule TAKE ONE CAPSULE BY MOUTH 4 TIMES DAILY (Patient taking differently: TAKE ONE CAPSULE BY MOUTH TWICE DAILY - MAY TAKE 2 ADDITIONAL CAPSULES DURING THE DAY AS NEEDED FOR PAIN - MAX 4 CAPSULES DAILY) 120 capsule 3  . HUMALOG MIX 75/25 (75-25) 100 UNIT/ML SUSP injection Take 30 units subcutaneously daily with breakfast and 26 units with supper 10 mL 1  . insulin lispro (HUMALOG KWIKPEN) 100 UNIT/ML KiwkPen If blood sugar is 250+, take 10 units, if blood sugar is over 350, take 15 units. Dx code E11.9 15 mL 2  . levothyroxine (SYNTHROID, LEVOTHROID) 112 MCG tablet TAKE 1 TABLET BY MOUTH ONCE DAILY 90 tablet 0  . menthol-thymol (ABSORBINE JR) LIQD Apply 1 application topically 3 (three) times daily as needed (pain). For pain.    . nitroGLYCERIN (NITROSTAT) 0.4 MG SL tablet PLACE ONE TABLET UNDER THE TONGUE EVERY 5 MINUTES AS NEEDED FOR CHEST PAIN 25 tablet 3  . Polyethyl Glycol-Propyl Glycol (SYSTANE)  0.4-0.3 % SOLN Apply 1-2 drops to eye 3 (three) times daily as needed (for dry eyes).    . polyethylene glycol (MIRALAX / GLYCOLAX) packet Take 17 g by mouth daily as needed (constipation). Mix in 8 oz liquid and drink    . rosuvastatin (  CRESTOR) 10 MG tablet Take 1 tablet (10 mg total) by mouth daily. 90 tablet 2  . TOUJEO SOLOSTAR 300 UNIT/ML SOPN Inject 20 units in the morning  2  . triamcinolone (NASACORT AQ) 55 MCG/ACT AERO nasal inhaler Place 2 sprays into the nose daily.     . vitamin B-12 (CYANOCOBALAMIN) 1000 MCG tablet Take 1,000 mcg by mouth daily.     No current facility-administered medications on file prior to visit.     Allergies  Allergen Reactions  . Penicillins Swelling    Eyes and lips Has patient had a PCN reaction causing immediate rash, facial/tongue/throat swelling, SOB or lightheadedness with hypotension: Yes Has patient had a PCN reaction causing severe rash involving mucus membranes or skin necrosis: No Has patient had a PCN reaction that required hospitalization No Has patient had a PCN reaction occurring within the last 10 years: No If all of the above answers are "NO", then may proceed with Cephalosporin use   . Prednisone Other (See Comments)    Hyperglycemia and AMS     Assessment/Plan:  1. Hypertension - Pt's BP today was at his goal of <140/90 mmHg. Will not start any new medications. Encouraged pt to keep monitoring BP at home and to record the measurements. Instructed pt to contact the clinic if BP becomes persistently low or if he experiences increased dizziness, lightheadedness, or weakness. F/u at hypertension clinic as needed.   This patient was seen with Barkley Boards, [facility] PharmD Student [phone].  Thank you, Lelan Pons. Patterson [place], [place]  [address] [place], [place] [address]  Phone: [phone]; Fax: [phone] 10/17/2016 10:34 PM

## 2016-10-17 NOTE — Telephone Encounter (Signed)
I have sent this Rx to be cosigned and will call patient to come pick up once signed and returned to me.

## 2016-10-17 NOTE — Telephone Encounter (Signed)
Called patient and let his wife know that this prescription is ready for pick up.

## 2016-10-17 NOTE — Patient Instructions (Signed)
It was good seeing you today!  Your blood pressure today is at goal. We will not start any new blood pressure medications.  Continue to check your blood pressure at home and keep a log of the readings.   If you notice that your blood pressure or heart rate is becoming persistently low or if you experience increased dizziness, lightheadedness, or weakness, please contact the clinic at [phone].   Follow up at the clinic as needed.

## 2016-10-21 ENCOUNTER — Institutional Professional Consult (permissible substitution): Payer: Medicare Other | Admitting: Internal Medicine

## 2016-10-23 DIAGNOSIS — Z85828 Personal history of other malignant neoplasm of skin: Secondary | ICD-10-CM | POA: Diagnosis not present

## 2016-10-23 DIAGNOSIS — C44319 Basal cell carcinoma of skin of other parts of face: Secondary | ICD-10-CM | POA: Diagnosis not present

## 2016-11-04 ENCOUNTER — Telehealth: Payer: Self-pay | Admitting: Endocrinology

## 2016-11-04 NOTE — Telephone Encounter (Signed)
MEDICATION: oxyCODONE-acetaminophen (ROXICET) 5-325 MG tablet  PHARMACY: ENDOCRINOLOGY    IS THIS A 90 DAY SUPPLY : unknown  IS PATIENT OUT OF MEDICATION: yes  IF NOT; HOW MUCH IS LEFT: n/a  LAST APPOINTMENT DATE:10/10/16  NEXT APPOINTMENT DATE:12/09/16  OTHER COMMENTS: Call when ready for pick up   **Let patient know to contact pharmacy at the end of the day to make sure medication is ready. **  ** Please notify patient to allow 48-72 hours to process**  **Encourage patient to contact the pharmacy for refills or they can request refills through [facility]**

## 2016-11-04 NOTE — Telephone Encounter (Signed)
Routing to you °

## 2016-11-04 NOTE — Telephone Encounter (Signed)
Please find out what he is taking this for and how often, he was given 60 tablets on 10/17/16

## 2016-11-04 NOTE — Telephone Encounter (Signed)
Please advise if okay to refill. Last filled on 10-17-2016.

## 2016-11-04 NOTE — Telephone Encounter (Signed)
Called patient and left a voice message to please call us back and let us know what he is taking this for and how often he is taking the Oxycodone.

## 2016-11-05 NOTE — Telephone Encounter (Signed)
Patient is taking oxycodone b/c he has arthritis all over.  He was prescribed 3 per day, which is a 20 day supply.  Please call back to discuss.  Ty,  -LL

## 2016-11-06 ENCOUNTER — Telehealth: Payer: Self-pay

## 2016-11-06 MED ORDER — OXYCODONE-ACETAMINOPHEN 5-325 MG PO TABS: 2.0000 | ORAL_TABLET | Freq: Three times a day (TID) | ORAL | 0 refills | Status: DC | PRN

## 2016-11-06 NOTE — Telephone Encounter (Signed)
May have new prescription but will need to have him take it only twice a day as needed, 60 tablets

## 2016-11-06 NOTE — Telephone Encounter (Signed)
Barry Mcdaniel came into the office today to pick up her husbands prescription and to talk about some concerns she is having- wife stated that the patient has been taking more of the percocet than prescribed because he is forgetting he has took it so she has taken the pills away from him because she noticed he had taken 53 in one day- wife also stated that the patient has been experiencing seizures, very low blood pressure, and very low pulse lately and she is beginning to get worried. She also stated that the patient has been having conversations with people that have passed away and people that are not in the room. Wife stated that the patient was drinking alcohol last weekend while on the pain medication but he seemed to be ok afterward, she has been hiding the medication since he took too many and is trying to make sure he only gets what is prescribed but is having a hard time because when he is in pain he takes it even if it is not time for it. Barry Mcdaniel reported that the patient had a fever of 102 last Sunday but she gave him some doxycycline that she had left over from when Mr. Wyatt had surgery on his head- she says he is feeling better but she wanted to know if it is ok that he did not come in to be seen- he did not go to the hospital even for the seizures he has had- wife feels like the pain medication is hurting more than helping at this point- please advise

## 2016-11-06 NOTE — Telephone Encounter (Signed)
Patient does need to be seen for evaluation.  Also would recommend that he see a neurologist for seizures

## 2016-11-06 NOTE — Telephone Encounter (Signed)
Called and notified patient that RX was at the office to pick up.

## 2016-11-06 NOTE — Telephone Encounter (Signed)
Left vm letting patient know that his prescription is ready for pick-up at the front desk

## 2016-11-06 NOTE — Telephone Encounter (Signed)
Submitted, and printed. Patient is coming to pick up.

## 2016-11-06 NOTE — Telephone Encounter (Signed)
Please see message below  Thank you

## 2016-11-08 ENCOUNTER — Telehealth: Payer: Self-pay

## 2016-11-08 ENCOUNTER — Other Ambulatory Visit: Payer: Self-pay

## 2016-11-08 ENCOUNTER — Other Ambulatory Visit: Payer: Self-pay | Admitting: Endocrinology

## 2016-11-08 DIAGNOSIS — R569 Unspecified convulsions: Secondary | ICD-10-CM

## 2016-11-08 NOTE — Telephone Encounter (Signed)
Is the 12/09/16 ok to just keep because schedule is full until closer to the end of sep? Please advise

## 2016-11-08 NOTE — Telephone Encounter (Signed)
Please have patient worked in sooner than October 1.  Also Dr. Loanne Drilling needs to see him today for cough

## 2016-11-08 NOTE — Telephone Encounter (Signed)
LM for pt to call back asap to get on the schedule  Messages were sent by Lattie Haw to both Drs. Loanne Drilling and Dwyane Dee.

## 2016-11-08 NOTE — Telephone Encounter (Signed)
Please offer ov now.

## 2016-11-08 NOTE — Telephone Encounter (Signed)
Left vm requesting a call back to discuss the note below

## 2016-11-08 NOTE — Telephone Encounter (Signed)
Patients wife states patient is coughing up thick gray mucus which has a little blood in it should she be alarmed? Patient is coughing more than usual please advise

## 2016-11-08 NOTE — Telephone Encounter (Signed)
Spoke with the patients wife and she is willing to bring Barry Mcdaniel in for an evaluation and would like for Dr. Dwyane Dee to refer him to a neurologist. I was unable to find a spot for him to come in until mid September is that ok or do you want to see him sooner? Please advise so that I can get him scheduled

## 2016-11-08 NOTE — Telephone Encounter (Signed)
Neurology consultation has been done.  Mid-September is okay unless he is having any urgent problem

## 2016-11-08 NOTE — Telephone Encounter (Signed)
Patient's wife returning call.  Ty,  -LL

## 2016-11-13 NOTE — Progress Notes (Signed)
Cardiology Office Note    Date:  11/18/2016   ID:  Barry Officer Sr., DOB February 13, 1933, MRN [id number]  PCP:  [name], MD  Cardiologist:  Dr. [name]   CC: follow up- atrial flutter s/p DCCV  History of Present Illness:  Barry MARKIN Sr. is a 83 y.o. male with a history of HLD, DMT2, HTN, OSA, PVD, trifascicular block and recently diagnosed CAD s/p CABG x5V with post op atrial flutter who presents to clinic for follow up after recent DCCV.   Admitted from 9/16-9/28/17. He presented with chest pain and ruled in for NSTEMI (pk trop 1.62). Cath showed critical 3 vessel and left main CAD and he was referred for CABG. Echo results showed mild AS, mild MR and LVEF 45-50%. He underwent a CABG x 5 on 11/29/2015. Post op course was c/b  atrial flutter and volume overload requiring diuresis. Given AV dissociation and history of trifascicular block, amiodarone and other AV nodal blocking agents were avoided. He was rate-controlled due to intrinsic AV block.  TEE/DCCV on 01/05/16. He was successfully cardioverted back into sinus with 1st degree block and occasional Mobitz I second degree, but not bradycardic.  His biggest problem is arthritis.He has had bronchial trouble his whole life. No cigarettes.  Seen by PA / BP clinic July wife's concern for low HR and BP Reviewed her notes and they are fine  ? 2 seizures last month to see neurology   Carotids 06/7423 with 95-63% LICA   Past Medical History:  Diagnosis Date  . Anemia    hx of year ago no problems now per pt   . Arthritis    generalized   . Asthma   . Atypical chest pain   . Bladder neck contracture   . Chronic back pain   . Complication of anesthesia    POST OP HYPOTENSION  . Dementia    per wife pt had neurological evaluation  . Diverticulosis of colon   . First degree heart block   . GERD (gastroesophageal reflux disease)   . Heart murmur   . History of basal cell carcinoma excision    forehead and x6 area forearms  .  History of Bell's palsy    x2  1998 &  2004 left side --  residual slight left eye droop and vace  . History of chronic bronchitis   . History of colon polyps   . History of gout   . History of hypertension   . History of MI (myocardial infarction)    08/ 2010  secondary acute renal failure/ dehydration--  medical management  . History of pulmonary embolus (PE)    2010  . Hypogonadism male   . Hypotension   . Hypothyroidism   . OSA (obstructive sleep apnea)    severe per study 2007/  non-compliant cpap  . Peripheral vascular disease ([place])   . Prostate carcinoma [facility]) urologist-  dr Gaynelle Arabian   DX 2007  Gleason 6  and recurrent 2012  s/p  cryoablation of prostate both times  . RBBB (right bundle branch block)   . Recovering alcoholic in remission ([place])   . Renal calculus, bilateral   . Type 2 diabetes mellitus ([place])     Past Surgical History:  Procedure Laterality Date  . CARDIAC CATHETERIZATION N/A 11/27/2015   Procedure: Left Heart Cath and Coronary Angiography;  Surgeon: Tajay Muzzy M [name], MD;  Location: [place] [facility];  Service: Cardiovascular;  Laterality: N/A;  . CARDIOVASCULAR STRESS  TEST  05-19-2012  dr Tressia Miners turner   normal perfusionstudy/  no ischemia/  attenuation artifact inferoapex region of myocardium/  note gate secondary to rhythm irregularity  . CARDIOVERSION N/A 01/05/2016   Procedure: CARDIOVERSION;  Surgeon: Sanda Klein, MD;  Location: [place];  Service: Cardiovascular;  Laterality: N/A;  . CATARACT EXTRACTION W/ INTRAOCULAR LENS IMPLANT Right 2014  . COLONOSCOPY W/ POLYPECTOMY  last one 08-10-2013  . CORONARY ARTERY BYPASS GRAFT N/A 11/29/2015   Procedure: CORONARY ARTERY BYPASS GRAFTING (CABG), ON PUMP, TIMES FIVE, USING LEFT INTERNAL MAMMARY ARTERY, RIGHT GREATER SAPHENOUS VEIN HARVESTED ENDOSCOPICALLY;  Surgeon: [name], MD;  Location: [facility];  Service: Open Heart Surgery;  Laterality: N/A;  LIMA-LAD; SEQ SVG-OM1-OM2;SVG-DIAG; SVG-PL  .  CYSTOSCOPY WITH URETHRAL DILATATION N/A 01/14/2014   Procedure: URETHRAL MEATAL DILATATION;  Surgeon: Ailene Rud, MD;  Location: [facility];  Service: Urology;  Laterality: N/A;  . EXCISION BENIGN SKIN LESION OF BACK  10/ 2011  . HIATAL HERNIA REPAIR  1983  . INGUINAL HERNIA REPAIR Right 10-14-2001  . INTRAOPERATIVE TRANSESOPHAGEAL ECHOCARDIOGRAM N/A 11/29/2015   Procedure: INTRAOPERATIVE TRANSESOPHAGEAL ECHOCARDIOGRAM;  Surgeon: [name], MD;  Location: [facility];  Service: Open Heart Surgery;  Laterality: N/A;  . KNEE ARTHROSCOPY W/ MENISCECTOMY Right 01-25-2003   and chondroplasty  . [place]  . PROSTATE CRYOABLATION  09-17-2010  &  04-12-2005  . REPAIR RIGHT ARM TENDON INJURY  1975  . SHOULDER OPEN ROTATOR CUFF REPAIR  03/20/2011   Procedure: ROTATOR CUFF REPAIR SHOULDER OPEN;  Surgeon: Tobi Bastos;  Location: [facility];  Service: Orthopedics;  Laterality: Right;  . TEE WITHOUT CARDIOVERSION N/A 01/05/2016   Procedure: TRANSESOPHAGEAL ECHOCARDIOGRAM (TEE);  Surgeon: Sanda Klein, MD;  Location: [place] [place];  Service: Cardiovascular;  Laterality: N/A;  . TEE WITHOUT CARDIOVERSION N/A 07/30/2016   Procedure: TRANSESOPHAGEAL ECHOCARDIOGRAM (TEE);  Surgeon: Skeet Latch, MD;  Location: [place] [facility];  Service: Cardiovascular;  Laterality: N/A;  . TONSILLECTOMY  as child  . TRANSTHORACIC ECHOCARDIOGRAM  01-11-2013  dr [name]   moderate LVH/  grade I diastolic dysfunction/ ef 03-47%/  mild AV stenosis /  mild MR/  mild LAE  . TRANSURETHRAL RESECTION OF BLADDER TUMOR N/A 01/14/2014   Procedure: TRANSURETHRAL INCISION OF BLADDER NECK CONTRACTURE;  Surgeon: Ailene Rud, MD;  Location: [facility];  Service: Urology;  Laterality: N/A;  . Vantus Implant     Hormones for Prostate  . VENTRAL HERNIA REPAIR  1998    Current Medications: Outpatient Medications Prior to Visit  Medication Sig Dispense Refill  .  acetaminophen (TYLENOL) 500 MG tablet Take 500 mg by mouth every 8 (eight) hours as needed for mild pain, moderate pain or headache.     . albuterol (PROVENTIL HFA;VENTOLIN HFA) 108 (90 Base) MCG/ACT inhaler Inhale 2 puffs into the lungs every 6 (six) hours as needed for wheezing or shortness of breath. 6.7 g 1  . aspirin EC 81 MG tablet Take 81 mg by mouth daily.     . budesonide-formoterol (SYMBICORT) 160-4.5 MCG/ACT inhaler Inhale 2 puffs into the lungs 2 (two) times daily. 3 Inhaler 3  . ELIQUIS 5 MG TABS tablet TAKE ONE TABLET BY MOUTH TWICE DAILY 60 tablet 6  . ferrous sulfate 325 (65 FE) MG EC tablet Take 325 mg by mouth daily with supper.     . finasteride (PROSCAR) 5 MG tablet Take 5 mg by mouth daily.     Marland Kitchen  gabapentin (NEURONTIN) 300 MG capsule TAKE ONE CAPSULE BY MOUTH 4 TIMES DAILY (Patient taking differently: TAKE ONE CAPSULE BY MOUTH TWICE DAILY - MAY TAKE 2 ADDITIONAL CAPSULES DURING THE DAY AS NEEDED FOR PAIN - MAX 4 CAPSULES DAILY) 120 capsule 3  . HUMALOG MIX 75/25 (75-25) 100 UNIT/ML SUSP injection Take 30 units subcutaneously daily with breakfast and 26 units with supper 10 mL 1  . insulin lispro (HUMALOG KWIKPEN) 100 UNIT/ML KiwkPen If blood sugar is 250+, take 10 units, if blood sugar is over 350, take 15 units. Dx code E11.9 15 mL 2  . levothyroxine (SYNTHROID, LEVOTHROID) 112 MCG tablet TAKE 1 TABLET BY MOUTH ONCE DAILY 90 tablet 0  . menthol-thymol (ABSORBINE JR) LIQD Apply 1 application topically 3 (three) times daily as needed (pain). For pain.    . nitroGLYCERIN (NITROSTAT) 0.4 MG SL tablet PLACE ONE TABLET UNDER THE TONGUE EVERY 5 MINUTES AS NEEDED FOR CHEST PAIN 25 tablet 3  . oxyCODONE-acetaminophen (ROXICET) 5-325 MG tablet Take 2 tablets by mouth every 8 (eight) hours as needed for severe pain. 60 tablet 0  . Polyethyl Glycol-Propyl Glycol (SYSTANE) 0.4-0.3 % SOLN Apply 1-2 drops to eye 3 (three) times daily as needed (for dry eyes).    . polyethylene glycol (MIRALAX /  GLYCOLAX) packet Take 17 g by mouth daily as needed (constipation). Mix in 8 oz liquid and drink    . rosuvastatin (CRESTOR) 10 MG tablet Take 1 tablet (10 mg total) by mouth daily. 90 tablet 2  . TOUJEO SOLOSTAR 300 UNIT/ML SOPN Inject 20 units in the morning  2  . triamcinolone (NASACORT AQ) 55 MCG/ACT AERO nasal inhaler Place 2 sprays into the nose daily.     . vitamin B-12 (CYANOCOBALAMIN) 1000 MCG tablet Take 1,000 mcg by mouth daily.     No facility-administered medications prior to visit.      Allergies:   Penicillins and Prednisone   Social History   Social History  . Marital status: Married    Spouse name: N/A  . Number of children: N/A  . Years of education: N/A   Occupational History  . Retired      Personal assistant    Social History Main Topics  . Smoking status: Never Smoker  . Smokeless tobacco: Never Used  . Alcohol use No     Comment: RECOVERING ALCOHOLIC none since 6270   . Drug use: No  . Sexual activity: Not Currently    Birth control/ protection: None   Other Topics Concern  . None   Social History Narrative  . None     Family History:  The patient's family history includes Cancer in his brother, brother, brother, brother, brother, brother, and sister; Diabetes in his sister and sister; Heart disease in his mother; Stroke in his brother and father.     ROS All other systems reviewed and are negative.   PHYSICAL EXAM:   VS:  BP 138/70   Pulse 71   Ht 5\' 11"  (1.803 m)   Wt 190 lb 1.9 oz (86.2 kg)   SpO2 98%   BMI 26.52 kg/m      Affect appropriate Healthy:  appears stated age 56: normal Neck supple with no adenopathy JVP normal bilateral  bruits no thyromegaly Lungs clear with no wheezing and good diaphragmatic motion Heart:  S1/S2 AS murmur, no rub, gallop or click post sternotomy PMI normal Abdomen: benighn, BS positve, no tenderness, no AAA no bruit.  No HSM or HJR Distal  pulses intact with no bruits Post right SVG venectomy  Neuro  non-focal Skin warm and dry No muscular weakness RLE venectomy healed    Wt Readings from Last 3 Encounters:  11/18/16 190 lb 1.9 oz (86.2 kg)  10/10/16 191 lb (86.6 kg)  10/03/16 187 lb (84.8 kg)      Studies/Labs Reviewed:   EKG:     01/10/16 SR first degree RBBB rate 71   Recent Labs: 11/25/2015: B Natriuretic Peptide 1,139.9 11/30/2015: Magnesium 2.2 08/26/2016: ALT 8 10/07/2016: BUN 24; Creatinine, Ser 1.22; Hemoglobin 11.0; Platelets 150.0; Potassium 4.1; Sodium 142; TSH 0.07   Lipid Panel    Component Value Date/Time   CHOL 117 10/31/2015 1340   TRIG 98.0 10/31/2015 1340   HDL 44.60 10/31/2015 1340   CHOLHDL 3 10/31/2015 1340   VLDL 19.6 10/31/2015 1340   LDLCALC 53 10/31/2015 1340    Additional studies/ records that were reviewed today include:  Left Heart Cath and Coronary Angiography by Dr. [name] on 11/27/2015:  Conclusion     Ost LM to LM lesion, 80 %stenosed.  Prox LAD lesion, 80 %stenosed.  Mid LAD lesion, 90 %stenosed.  Dist LAD lesion, 95 %stenosed.  Ost 1st Diag to 1st Diag lesion, 100 %stenosed.  Ost Ramus lesion, 90 %stenosed.  Ost Cx to Prox Cx lesion, 90 %stenosed.  Ost 1st Mrg to 1st Mrg lesion, 80 %stenosed.  Prox Cx to Mid Cx lesion, 100 %stenosed.  Prox RCA lesion, 95 %stenosed.  Dist RCA lesion, 90 %stenosed.  1. Critical 3 vessel and left main CAD 2. Mildly elevated LV EDP 3. No significant AV gradient by pullback.    Coronary artery bypass grafting x5 with left internal mammary to the left anterior descending coronary artery reverse saphenous vein graft to the diagonal coronary artery, sequential reverse saphenous vein graft to the first and second obtuse marginal reverse saphenous vein graft to the posterior lateral branch of the right coronary artery with right thigh and calf greater saphenous vein harvesting endoscopically by Dr. Servando Snare on 11/29/2015.   2D ECHO: 11/28/2015 LV EF: 45% -   50% Study  Conclusions - Left ventricle: The cavity size was normal. There was severe   concentric hypertrophy. Systolic function was mildly reduced. The   estimated ejection fraction was in the range of 45% to 50%.   Hypokinesis of the apical, mid-apical anteroseptal and   inferoseptal myocardium, and apical inferolateral, and inferior   myocardium. Doppler parameters are consistent with a reversible   restrictive pattern, indicative of decreased left ventricular   diastolic compliance and/or increased left atrial pressure (grade   3 diastolic dysfunction). - Aortic valve: Trileaflet; moderately thickened, moderately   calcified leaflets. Valve mobility was restricted. There was mild   stenosis. There was no regurgitation. Valve area (VTI): 0.88   cm^2. Valve area (Vmax): 0.96 cm^2. Valve area (Vmean): 0.86   cm^2. - Mitral valve: Calcified annulus. Transvalvular velocity was   within the normal range. There was no evidence for stenosis.   There was mild regurgitation. - Left atrium: The atrium was severely dilated. - Right ventricle: The cavity size was normal. Wall thickness was   normal. Systolic function was normal. - Atrial septum: A patent foramen ovale cannot be excluded. - Tricuspid valve: There was mild regurgitation. Impressions: - Aortic valve appears heavily calcified and thickend. There   appears to be mild aortic stenosis by peak velocity and mean   gradients. However, given reduced systolic function, cannot rule   out  more severe valvular heart disease or &quot;low flow, low   gradient&quot; aortic stenosis.   ASSESSMENT & PLAN:   CAD: LM 3VD seen by s/p CABG x5V  September 2017 . Continue ASA and statin.    Post op atrial flutter s/p DCCV 01/05/16 : now it Mobitz type I.  -- No AV nodal blocking agents. Continue Eliquis 5mg  BID for CHADSVASC of 6 (CHF, HTN, age, DM, CAD).   Bifascicular block, Mobitz type I: significant conduction abnormality seen on EKG, avoiding Beta  blockers. No syncope. Continue to monitor He deferred event monitor in July   Chronic kidney disease stage III: creat stable  Lab Results  Component Value Date   CREATININE 1.22 10/07/2016   BUN 24 (H) 10/07/2016   NA 142 10/07/2016   K 4.1 10/07/2016   CL 109 10/07/2016   CO2 25 10/07/2016     Hyperlipidemia: LDL 53. Continue Crestor 10mg  daily.   Diabetes with nephropathy: continue current regimen and follow up with Dr. Dwyane Dee  HTN: BP well controlled on current regimen off ACE due to relatively low BP readings at home   Aortic Stenosis : moderate mean gradient 25 mmHg TEE 07/30/16 no AVR due to emergent nature Of CBG with LM and 3 VD and apparently limited gradient at cath although when I review the cath Note Ao was 115 and LV was 141 mmHg will do f/u echo in 6 months   Carotid: f/u duplex known left ICA 40-59%   Seizures: f/u neurology BP stable not postural no evidence of recurrent PAF   Jenkins Rouge

## 2016-11-18 ENCOUNTER — Ambulatory Visit (INDEPENDENT_AMBULATORY_CARE_PROVIDER_SITE_OTHER): Payer: Medicare Other | Admitting: Cardiovascular Disease

## 2016-11-18 ENCOUNTER — Encounter: Payer: Self-pay | Admitting: Cardiovascular Disease

## 2016-11-18 VITALS — BP 138/70 | HR 71 | Ht 71.0 in | Wt 190.1 lb

## 2016-11-18 DIAGNOSIS — I2510 Atherosclerotic heart disease of native coronary artery without angina pectoris: Secondary | ICD-10-CM

## 2016-11-18 DIAGNOSIS — R0989 Other specified symptoms and signs involving the circulatory and respiratory systems: Secondary | ICD-10-CM

## 2016-11-18 NOTE — Patient Instructions (Addendum)
Medication Instructions:  Your physician recommends that you continue on your current medications as directed. Please refer to the Current Medication list given to you today.  Labwork: NONE  Testing/Procedures: Your physician has requested that you have a carotid duplex. This test is an ultrasound of the carotid arteries in your neck. It looks at blood flow through these arteries that supply the brain with blood. Allow one hour for this exam. There are no restrictions or special instructions.  Follow-Up: Your physician wants you to follow-up in: 6 months with PA or NP. You will receive a reminder letter in the mail two months in advance. If you don't receive a letter, please call our office to schedule the follow-up appointment.  Your physician wants you to follow-up in: 1 year with Dr. Blima Singer will receive a reminder letter in the mail two months in advance. If you don't receive a letter, please call our office to schedule the follow-up appointment.    If you need a refill on your cardiac medications before your next appointment, please call your pharmacy.

## 2016-11-19 ENCOUNTER — Ambulatory Visit (HOSPITAL_COMMUNITY)
Admission: RE | Admit: 2016-11-19 | Discharge: 2016-11-19 | Disposition: A | Discharge status: Home / Self Care | Payer: Medicare Other | Source: Ambulatory Visit | Attending: Cardiovascular Disease | Admitting: Cardiovascular Disease

## 2016-11-19 DIAGNOSIS — I2510 Atherosclerotic heart disease of native coronary artery without angina pectoris: Secondary | ICD-10-CM | POA: Diagnosis not present

## 2016-11-19 DIAGNOSIS — R0989 Other specified symptoms and signs involving the circulatory and respiratory systems: Secondary | ICD-10-CM | POA: Diagnosis not present

## 2016-11-19 DIAGNOSIS — E785 Hyperlipidemia, unspecified: Secondary | ICD-10-CM | POA: Insufficient documentation

## 2016-11-19 DIAGNOSIS — E119 Type 2 diabetes mellitus without complications: Secondary | ICD-10-CM | POA: Diagnosis not present

## 2016-11-19 DIAGNOSIS — I6523 Occlusion and stenosis of bilateral carotid arteries: Secondary | ICD-10-CM | POA: Insufficient documentation

## 2016-11-19 DIAGNOSIS — Z951 Presence of aortocoronary bypass graft: Secondary | ICD-10-CM | POA: Insufficient documentation

## 2016-11-19 DIAGNOSIS — I10 Essential (primary) hypertension: Secondary | ICD-10-CM | POA: Diagnosis not present

## 2016-11-23 ENCOUNTER — Other Ambulatory Visit: Payer: Self-pay | Admitting: Endocrinology

## 2016-11-23 ENCOUNTER — Other Ambulatory Visit: Payer: Self-pay | Admitting: Physician Assistant

## 2016-11-26 ENCOUNTER — Telehealth: Payer: Self-pay | Admitting: Endocrinology

## 2016-11-26 NOTE — Telephone Encounter (Signed)
MEDICATION: oxyCODONE-acetaminophen (ROXICET) 5-325 MG tablet  PHARMACY:   [facility] ([facility]), [place] - [address] [phone] (Phone) [phone] (Fax)     IS THIS A 90 DAY SUPPLY : N  IS PATIENT OUT OF MEDICATION: N  IF NOT; HOW MUCH IS LEFT:  1 pill left  LAST APPOINTMENT DATE: 10/10/16  NEXT APPOINTMENT DATE: 12/09/16  OTHER COMMENTS:    **Let patient know to contact pharmacy at the end of the day to make sure medication is ready. **  ** Please notify patient to allow 48-72 hours to process**  **Encourage patient to contact the pharmacy for refills or they can request refills through [facility]**

## 2016-11-26 NOTE — Telephone Encounter (Signed)
Routing to you °

## 2016-11-27 ENCOUNTER — Other Ambulatory Visit: Payer: Self-pay

## 2016-11-27 MED ORDER — OXYCODONE-ACETAMINOPHEN 5-325 MG PO TABS: 2.0000 | ORAL_TABLET | Freq: Three times a day (TID) | ORAL | 0 refills | Status: DC | PRN

## 2016-11-29 NOTE — Telephone Encounter (Signed)
Looks like this prescription was filled by Lattie Haw on 11/27/2016.

## 2016-12-05 ENCOUNTER — Other Ambulatory Visit (INDEPENDENT_AMBULATORY_CARE_PROVIDER_SITE_OTHER): Payer: Medicare Other

## 2016-12-05 DIAGNOSIS — D508 Other iron deficiency anemias: Secondary | ICD-10-CM

## 2016-12-05 DIAGNOSIS — E1165 Type 2 diabetes mellitus with hyperglycemia: Secondary | ICD-10-CM

## 2016-12-05 DIAGNOSIS — E063 Autoimmune thyroiditis: Secondary | ICD-10-CM | POA: Diagnosis not present

## 2016-12-05 DIAGNOSIS — Z794 Long term (current) use of insulin: Secondary | ICD-10-CM | POA: Diagnosis not present

## 2016-12-05 LAB — COMPREHENSIVE METABOLIC PANEL
ALBUMIN: 3.8 g/dL (ref 3.5–5.2)
ALK PHOS: 62 U/L (ref 39–117)
ALT: 7 U/L (ref 0–53)
AST: 13 U/L (ref 0–37)
BUN: 24 mg/dL — AB (ref 6–23)
CALCIUM: 9.2 mg/dL (ref 8.4–10.5)
CHLORIDE: 110 meq/L (ref 96–112)
CO2: 23 mEq/L (ref 19–32)
Creatinine, Ser: 1.18 mg/dL (ref 0.40–1.50)
GFR: 62.85 mL/min (ref >60.00)
Glucose, Bld: 88 mg/dL (ref 70–99)
POTASSIUM: 4.2 meq/L (ref 3.5–5.1)
Sodium: 140 mEq/L (ref 135–145)
TOTAL PROTEIN: 6.8 g/dL (ref 6.0–8.3)
Total Bilirubin: 0.5 mg/dL (ref 0.2–1.2)

## 2016-12-05 LAB — MICROALBUMIN / CREATININE URINE RATIO
CREATININE, U: 153.9 mg/dL
MICROALB/CREAT RATIO: 12.9 mg/g (ref 0.0–30.0)
Microalb, Ur: 19.8 mg/dL — ABNORMAL HIGH (ref 0.0–1.9)

## 2016-12-05 LAB — T4, FREE: FREE T4: 1.11 ng/dL (ref 0.60–1.60)

## 2016-12-05 LAB — CBC
HCT: 36.8 % — ABNORMAL LOW (ref 39.0–52.0)
Hemoglobin: 11.8 g/dL — ABNORMAL LOW (ref 13.0–17.0)
MCHC: 32.1 g/dL (ref 30.0–36.0)
MCV: 89.4 fl (ref 78.0–100.0)
PLATELETS: 146 10*3/uL — AB (ref 150.0–400.0)
RBC: 4.12 Mil/uL — ABNORMAL LOW (ref 4.22–5.81)
RDW: 16.6 % — ABNORMAL HIGH (ref 11.5–15.5)
WBC: 7.4 10*3/uL (ref 4.0–10.5)

## 2016-12-05 LAB — HEMOGLOBIN A1C: Hgb A1c MFr Bld: 7.5 % — ABNORMAL HIGH (ref 4.6–6.5)

## 2016-12-05 LAB — TSH: TSH: 1.15 u[IU]/mL (ref 0.35–4.50)

## 2016-12-09 ENCOUNTER — Encounter: Payer: Self-pay | Admitting: Endocrinology

## 2016-12-09 ENCOUNTER — Ambulatory Visit (INDEPENDENT_AMBULATORY_CARE_PROVIDER_SITE_OTHER): Payer: Medicare Other | Admitting: Endocrinology

## 2016-12-09 VITALS — BP 120/70 | HR 72 | Wt 187.0 lb

## 2016-12-09 DIAGNOSIS — Z794 Long term (current) use of insulin: Secondary | ICD-10-CM

## 2016-12-09 DIAGNOSIS — E1165 Type 2 diabetes mellitus with hyperglycemia: Secondary | ICD-10-CM | POA: Diagnosis not present

## 2016-12-09 DIAGNOSIS — G8929 Other chronic pain: Secondary | ICD-10-CM | POA: Diagnosis not present

## 2016-12-09 DIAGNOSIS — I2510 Atherosclerotic heart disease of native coronary artery without angina pectoris: Secondary | ICD-10-CM | POA: Diagnosis not present

## 2016-12-09 DIAGNOSIS — Z23 Encounter for immunization: Secondary | ICD-10-CM

## 2016-12-09 DIAGNOSIS — R05 Cough: Secondary | ICD-10-CM | POA: Diagnosis not present

## 2016-12-09 DIAGNOSIS — R059 Cough, unspecified: Secondary | ICD-10-CM

## 2016-12-09 DIAGNOSIS — D508 Other iron deficiency anemias: Secondary | ICD-10-CM

## 2016-12-09 NOTE — Progress Notes (Signed)
Patient ID: Barry Officer Sr., male   DOB: 1932-03-26, 83 y.o.   MRN: [id number]   Reason for Appointment: follow-up  of various issues   History of Present Illness   Diagnosis: Type 2 diabetes mellitus, date of diagnosis: 1997.   He has been on basal bolus insulin regimen for a few years.  His blood sugars are usually variably controlled and A1c is usually over 8%  except once was 7.2.  Previously had tried Victoza with some benefit but did not continue this because of perceived side effect of constipation and also out-of-pocket expense  RECENT history:   Insulin regimen: Toujeo insulin 20 units in am  Humalog mix insulin 30 units a.m.,-26 acs   He has had inconsistent control of his diabetes since at least late 2015 Although his blood sugars improved  after changing Humalog to Humalog mix insulin before each meal in 2/16 he has had labile blood sugars  He still has poor control with blood sugars at home averaging 221 Surprisingly A1c is better at 7.5  His blood sugars are showing the following patterns and has the following problems identified:  His morning insulin was increased somewhat on the last visit  However his blood sugars are still markedly increased in the late afternoon when he is checking them to only a couple of good readings  Although he apparently is still eating sweets and ice cream in the afternoon fairly regularly he does not want to change this  Also if blood sugars are mostly higher later in the evening  He thinks he is taking his insulin consistently before meals regardless of the pre-meal blood sugar  No significant hypoglycemia and no low sugars overnight   FASTING blood sugars are mostly mildly increased but not consistently and as low as 80  He is not doing much physical activity  However appears to have lost a little weight  Monitors blood glucose: 2 times  a day.  Glucometer:  One Touch.  Glucose readings  from  download  Mean values apply above for all meters except median for One Touch  PRE-MEAL Fasting Lunch Dinner Bedtime Overall  Glucose range: 80-243   164-363  109-355    Mean/median: 145   308   201    Dietician visit: Most recent:1/14.  CDE visit 10/16   Exercise: a little walking  Wt Readings from Last 3 Encounters:  12/09/16 187 lb (84.8 kg)  11/18/16 190 lb 1.9 oz (86.2 kg)  10/10/16 191 lb (86.6 kg)   Lab Results  Component Value Date   HGBA1C 7.5 (H) 12/05/2016   HGBA1C 7.9 (H) 08/26/2016   HGBA1C 6.8 (H) 03/22/2016   Lab Results  Component Value Date   MICROALBUR 19.8 (H) 12/05/2016   LDLCALC 53 10/31/2015   CREATININE 1.18 12/05/2016    Other ACTIVE problems are discussed in review of systems    Lab on 12/05/2016  Component Date Value Ref Range Status  . WBC 12/05/2016 7.4  4.0 - 10.5 K/uL Final  . RBC 12/05/2016 4.12* 4.22 - 5.81 Mil/uL Final  . Platelets 12/05/2016 146.0* 150.0 - 400.0 K/uL Final  . Hemoglobin 12/05/2016 11.8* 13.0 - 17.0 g/dL Final  . HCT 12/05/2016 36.8* 39.0 - 52.0 % Final  . MCV 12/05/2016 89.4  78.0 - 100.0 fl Final  . MCHC 12/05/2016 32.1  30.0 - 36.0 g/dL Final  . RDW 12/05/2016 16.6* 11.5 - 15.5 % Final  .  Hgb A1c MFr Bld 12/05/2016 7.5* 4.6 - 6.5 % Final   Glycemic Control Guidelines for People with Diabetes:Non Diabetic:  <6%Goal of Therapy: <7%Additional Action Suggested:  >8%   . Sodium 12/05/2016 140  135 - 145 mEq/L Final  . Potassium 12/05/2016 4.2  3.5 - 5.1 mEq/L Final  . Chloride 12/05/2016 110  96 - 112 mEq/L Final  . CO2 12/05/2016 23  19 - 32 mEq/L Final  . Glucose, Bld 12/05/2016 88  70 - 99 mg/dL Final  . BUN 12/05/2016 24* 6 - 23 mg/dL Final  . Creatinine, Ser 12/05/2016 1.18  0.40 - 1.50 mg/dL Final  . Total Bilirubin 12/05/2016 0.5  0.2 - 1.2 mg/dL Final  . Alkaline Phosphatase 12/05/2016 62  39 - 117 U/L Final  . AST 12/05/2016 13  0 - 37 U/L Final  . ALT 12/05/2016 7  0 - 53 U/L Final  . Total Protein  12/05/2016 6.8  6.0 - 8.3 g/dL Final  . Albumin 12/05/2016 3.8  3.5 - 5.2 g/dL Final  . Calcium 12/05/2016 9.2  8.4 - 10.5 mg/dL Final  . GFR 12/05/2016 62.85  >60.00 mL/min Final  . TSH 12/05/2016 1.15  0.35 - 4.50 uIU/mL Final  . Free T4 12/05/2016 1.11  0.60 - 1.60 ng/dL Final   Comment: Specimens from patients who are undergoing biotin therapy and /or ingesting biotin supplements may contain high levels of biotin.  The higher biotin concentration in these specimens interferes with this Free T4 assay.  Specimens that contain high levels  of biotin may cause false high results for this Free T4 assay.  Please interpret results in light of the total clinical presentation of the patient.    Jacquelyne Balint, Ur 12/05/2016 19.8* 0.0 - 1.9 mg/dL Final  . Creatinine,U 12/05/2016 153.9  mg/dL Final  . Microalb Creat Ratio 12/05/2016 12.9  0.0 - 30.0 mg/g Final    OTHER active problems addressed today are reviewed in review of systems   Allergies as of 12/09/2016      Reactions   Penicillins Swelling   Eyes and lips Has patient had a PCN reaction causing immediate rash, facial/tongue/throat swelling, SOB or lightheadedness with hypotension: Yes Has patient had a PCN reaction causing severe rash involving mucus membranes or skin necrosis: No Has patient had a PCN reaction that required hospitalization No Has patient had a PCN reaction occurring within the last 10 years: No If all of the above answers are "NO", then may proceed with Cephalosporin use   Prednisone Other (See Comments)   Hyperglycemia and AMS      Medication List       Accurate as of 12/09/16 11:59 PM. Always use your most recent med list.          acetaminophen 500 MG tablet Commonly known as:  TYLENOL Take 500 mg by mouth every 8 (eight) hours as needed for mild pain, moderate pain or headache.   albuterol 108 (90 Base) MCG/ACT inhaler Commonly known as:  PROVENTIL HFA;VENTOLIN HFA Inhale 2 puffs into the lungs every 6  (six) hours as needed for wheezing or shortness of breath.   aspirin EC 81 MG tablet Take 81 mg by mouth daily.   budesonide-formoterol 160-4.5 MCG/ACT inhaler Commonly known as:  SYMBICORT Inhale 2 puffs into the lungs 2 (two) times daily.   ELIQUIS 5 MG Tabs tablet Generic drug:  apixaban TAKE ONE TABLET BY MOUTH TWICE DAILY   ferrous sulfate 325 (65 FE) MG EC tablet Take  325 mg by mouth daily with supper.   finasteride 5 MG tablet Commonly known as:  PROSCAR Take 5 mg by mouth daily.   gabapentin 300 MG capsule Commonly known as:  NEURONTIN TAKE 1 CAPSULE BY MOUTH 4 TIMES DAILY   HUMALOG MIX 75/25 (75-25) 100 UNIT/ML Susp injection Generic drug:  insulin lispro protamine-lispro Take 30 units subcutaneously daily with breakfast and 26 units with supper   insulin lispro 100 UNIT/ML KiwkPen Commonly known as:  HUMALOG KWIKPEN If blood sugar is 250+, take 10 units, if blood sugar is over 350, take 15 units. Dx code E11.9   levothyroxine 112 MCG tablet Commonly known as:  SYNTHROID, LEVOTHROID TAKE 1 TABLET BY MOUTH ONCE DAILY   menthol-thymol Liqd Apply 1 application topically 3 (three) times daily as needed (pain). For pain.   NASACORT AQ 55 MCG/ACT Aero nasal inhaler Generic drug:  triamcinolone Place 2 sprays into the nose daily.   nitroGLYCERIN 0.4 MG SL tablet Commonly known as:  NITROSTAT DISSOLVE ONE TABLET UNDER THE TONGUE EVERY 5 MINUTES AS NEEDED FOR CHEST PAIN.  DO NOT EXCEED A TOTAL OF 3 DOSES IN 15 MINUTES   oxyCODONE-acetaminophen 5-325 MG tablet Commonly known as:  ROXICET Take 2 tablets by mouth every 8 (eight) hours as needed for severe pain.   polyethylene glycol packet Commonly known as:  MIRALAX / GLYCOLAX Take 17 g by mouth daily as needed (constipation). Mix in 8 oz liquid and drink   rosuvastatin 10 MG tablet Commonly known as:  CRESTOR Take 1 tablet (10 mg total) by mouth daily.   SYSTANE 0.4-0.3 % Soln Generic drug:  Polyethyl  Glycol-Propyl Glycol Apply 1-2 drops to eye 3 (three) times daily as needed (for dry eyes).   TOUJEO SOLOSTAR 300 UNIT/ML Sopn Generic drug:  Insulin Glargine Inject 20 units in the morning   vitamin B-12 1000 MCG tablet Commonly known as:  CYANOCOBALAMIN Take 1,000 mcg by mouth daily.       Allergies:  Allergies  Allergen Reactions  . Penicillins Swelling    Eyes and lips Has patient had a PCN reaction causing immediate rash, facial/tongue/throat swelling, SOB or lightheadedness with hypotension: Yes Has patient had a PCN reaction causing severe rash involving mucus membranes or skin necrosis: No Has patient had a PCN reaction that required hospitalization No Has patient had a PCN reaction occurring within the last 10 years: No If all of the above answers are "NO", then may proceed with Cephalosporin use   . Prednisone Other (See Comments)    Hyperglycemia and AMS    Past Medical History:  Diagnosis Date  . Anemia    hx of year ago no problems now per pt   . Arthritis    generalized   . Asthma   . Atypical chest pain   . Bladder neck contracture   . Chronic back pain   . Complication of anesthesia    POST OP HYPOTENSION  . Dementia    per wife pt had neurological evaluation  . Diverticulosis of colon   . First degree heart block   . GERD (gastroesophageal reflux disease)   . Heart murmur   . History of basal cell carcinoma excision    forehead and x6 area forearms  . History of Bell's palsy    x2  1998 &  2004 left side --  residual slight left eye droop and vace  . History of chronic bronchitis   . History of colon polyps   .  History of gout   . History of hypertension   . History of MI (myocardial infarction)    08/ 2010  secondary acute renal failure/ dehydration--  medical management  . History of pulmonary embolus (PE)    2010  . Hypogonadism male   . Hypotension   . Hypothyroidism   . OSA (obstructive sleep apnea)    severe per study 2007/   non-compliant cpap  . Peripheral vascular disease ([place])   . Prostate carcinoma [facility]) urologist-  dr Gaynelle Arabian   DX 2007  Gleason 6  and recurrent 2012  s/p  cryoablation of prostate both times  . RBBB (right bundle branch block)   . Recovering alcoholic in remission ([place])   . Renal calculus, bilateral   . Type 2 diabetes mellitus ([place])     Past Surgical History:  Procedure Laterality Date  . CARDIAC CATHETERIZATION N/A 11/27/2015   Procedure: Left Heart Cath and Coronary Angiography;  Surgeon: Peter M [name], MD;  Location: [place] [facility];  Service: Cardiovascular;  Laterality: N/A;  . CARDIOVASCULAR STRESS TEST  05-19-2012  dr Tressia Miners turner   normal perfusionstudy/  no ischemia/  attenuation artifact inferoapex region of myocardium/  note gate secondary to rhythm irregularity  . CARDIOVERSION N/A 01/05/2016   Procedure: CARDIOVERSION;  Surgeon: Sanda Klein, MD;  Location: [place];  Service: Cardiovascular;  Laterality: N/A;  . CATARACT EXTRACTION W/ INTRAOCULAR LENS IMPLANT Right 2014  . COLONOSCOPY W/ POLYPECTOMY  last one 08-10-2013  . CORONARY ARTERY BYPASS GRAFT N/A 11/29/2015   Procedure: CORONARY ARTERY BYPASS GRAFTING (CABG), ON PUMP, TIMES FIVE, USING LEFT INTERNAL MAMMARY ARTERY, RIGHT GREATER SAPHENOUS VEIN HARVESTED ENDOSCOPICALLY;  Surgeon: [name], MD;  Location: [place];  Service: Open Heart Surgery;  Laterality: N/A;  LIMA-LAD; SEQ SVG-OM1-OM2;SVG-DIAG; SVG-PL  . CYSTOSCOPY WITH URETHRAL DILATATION N/A 01/14/2014   Procedure: URETHRAL MEATAL DILATATION;  Surgeon: Ailene Rud, MD;  Location: [facility];  Service: Urology;  Laterality: N/A;  . EXCISION BENIGN SKIN LESION OF BACK  10/ 2011  . HIATAL HERNIA REPAIR  1983  . INGUINAL HERNIA REPAIR Right 10-14-2001  . INTRAOPERATIVE TRANSESOPHAGEAL ECHOCARDIOGRAM N/A 11/29/2015   Procedure: INTRAOPERATIVE TRANSESOPHAGEAL ECHOCARDIOGRAM;  Surgeon: [name], MD;  Location: [place];   Service: Open Heart Surgery;  Laterality: N/A;  . KNEE ARTHROSCOPY W/ MENISCECTOMY Right 01-25-2003   and chondroplasty  . [place]  . PROSTATE CRYOABLATION  09-17-2010  &  04-12-2005  . REPAIR RIGHT ARM TENDON INJURY  1975  . SHOULDER OPEN ROTATOR CUFF REPAIR  03/20/2011   Procedure: ROTATOR CUFF REPAIR SHOULDER OPEN;  Surgeon: Tobi Bastos;  Location: [facility];  Service: Orthopedics;  Laterality: Right;  . TEE WITHOUT CARDIOVERSION N/A 01/05/2016   Procedure: TRANSESOPHAGEAL ECHOCARDIOGRAM (TEE);  Surgeon: Sanda Klein, MD;  Location: [place];  Service: Cardiovascular;  Laterality: N/A;  . TEE WITHOUT CARDIOVERSION N/A 07/30/2016   Procedure: TRANSESOPHAGEAL ECHOCARDIOGRAM (TEE);  Surgeon: Skeet Latch, MD;  Location: [place];  Service: Cardiovascular;  Laterality: N/A;  . TONSILLECTOMY  as child  . TRANSTHORACIC ECHOCARDIOGRAM  01-11-2013  dr [name]   moderate LVH/  grade I diastolic dysfunction/ ef 10-62%/  mild AV stenosis /  mild MR/  mild LAE  . TRANSURETHRAL RESECTION OF BLADDER TUMOR N/A 01/14/2014   Procedure: TRANSURETHRAL INCISION OF BLADDER NECK CONTRACTURE;  Surgeon: Ailene Rud, MD;  Location: [facility];  Service: Urology;  Laterality: N/A;  .  Vantus Implant     Hormones for Prostate  . VENTRAL HERNIA REPAIR  1998    Family History  Problem Relation Age of Onset  . Heart disease Mother   . Stroke Father   . Cancer Brother        unsure of type  . Cancer Sister   . Diabetes Sister   . Diabetes Sister   . Stroke Brother   . Cancer Brother   . Cancer Brother   . Cancer Brother   . Cancer Brother   . Cancer Brother     Social History:  reports that he has never smoked. He has never used smokeless tobacco. He reports that he does not drink alcohol or use drugs.  Review of Systems    He is taking his oxycodone about 3 times a day on an average including after waking up.  His wife says that he is in pain  all the time Does not take 2 tablets at bedtime as it makes him drowsy  He has  COPD with chronic cough, treated by the pulmonologist  He thinks his cough and sputum is somewhat worse but has not seen pulmonologist for some time Not clear if he is taking his Symbicort regularly   HYPERLIPIDEMIA: Well controlled with Crestor, needs follow-up   Lab Results  Component Value Date   CHOL 117 10/31/2015   HDL 44.60 10/31/2015   LDLCALC 53 10/31/2015   TRIG 98.0 10/31/2015   CHOLHDL 3 10/31/2015     He was diagnosed have had sleep apnea but he declined CPAP    HYPERTENSION: Blood pressure had been Previously treated with lisinopril  Blood pressure is consistently near normal now   BP Readings from Last 3 Encounters:  12/09/16 120/70  11/18/16 138/70  10/17/16 122/60    History of Primary hypothyroidism  This is controlled on 112 g, is taking this 6 days a week as directed; his wife has been trying to manage medications On his last visit he was taking this daily and TSH was low     Lab Results  Component Value Date   TSH 1.15 12/05/2016   TSH 0.07 (L) 10/07/2016   TSH 1.39 05/30/2016   FREET4 1.11 12/05/2016   FREET4 1.16 03/22/2016   FREET4 0.79 12/12/2015    ANEMIA: This is better, taking iron,  OTC ferrous sulfate With vitamin C   B-12 deficiency:He has been on B12 tablets 1000 mg daily   Lab Results  Component Value Date   WBC 7.4 12/05/2016   HGB 11.8 (L) 12/05/2016   HCT 36.8 (L) 12/05/2016   MCV 89.4 12/05/2016   PLT 146.0 (L) 12/05/2016   He has been asked to do a colonoscopy but he is not wanting to do this    Examination:   BP 120/70   Pulse 72   Wt 187 lb (84.8 kg)   SpO2 93%   BMI 26.08 kg/m   Body mass index is 26.08 kg/m.      Assesment/Plan:  Diabetes type 2, uncontrolled  See history of present illness for detailed discussion of his current management, blood sugar patterns and problems identified  Blood sugars are still poorly  controlled , average blood sugar over 200 again with mostly high readings after meals from poor diet  He is not getting enough insulin to cover his afternoon meals and sweets or snacks and has only one normal blood sugar in the afternoon Blood sugars are the highest around 6 PM supper  and averaging over 300 Surprisingly  A1c is 7.5 only  No problems with fasting hyperglycemia with low dose today and no hypoglycemia  Considering his age and multiple medical problems will only try to control his highest blood sugars For now he can increase his morning insulin by 6 units on the Humalog mix but continue same doses suppertime Also reduce Toujeo by 2 units for now to avoid potential overnight hypoglycemia  ANEMIA:  Appears to be improving and will continue to follow-up regularly He can do stool Hemoccult instead of going colonoscopy as requested by the gastroenterologist, this was given    PAIN management: He is reportedly taking oxycodone 3 times a day regularly Will confirm with drug screen also on the next visit  Cough and chronic bronchitis: He will follow-up with pulmonologist Meanwhile take Symbicort regularly  High-dose flu vaccine given  Patient Instructions  Humalog Mix 36 units at noon   Reduce Toujeo to 18 units  Take Symbicort 2x daily, call Dr Melvyn Novas       Total visit time for evaluation and management of multiple problems, counseling, review of blood sugar download, labs and medical record = 25 minutes  Daviyon Widmayer 12/10/2016, 1:04 PM

## 2016-12-09 NOTE — Patient Instructions (Addendum)
Humalog Mix 36 units at noon   Reduce Toujeo to 18 units  Take Symbicort 2x daily, call Dr Melvyn Novas

## 2016-12-10 ENCOUNTER — Other Ambulatory Visit: Payer: Self-pay | Admitting: Endocrinology

## 2016-12-13 ENCOUNTER — Encounter: Payer: Self-pay | Admitting: Neurology

## 2016-12-13 ENCOUNTER — Ambulatory Visit (INDEPENDENT_AMBULATORY_CARE_PROVIDER_SITE_OTHER): Payer: Medicare Other | Admitting: Neurology

## 2016-12-13 VITALS — BP 124/62 | HR 68 | Ht 71.0 in | Wt 190.0 lb

## 2016-12-13 DIAGNOSIS — G3184 Mild cognitive impairment, so stated: Secondary | ICD-10-CM | POA: Diagnosis not present

## 2016-12-13 DIAGNOSIS — R569 Unspecified convulsions: Secondary | ICD-10-CM

## 2016-12-13 DIAGNOSIS — I2510 Atherosclerotic heart disease of native coronary artery without angina pectoris: Secondary | ICD-10-CM | POA: Diagnosis not present
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
## 2016-12-13 NOTE — Patient Instructions (Signed)
1. Schedule 1-hour EEG 2. Continue to monitor medication intake 3. No driving after any episode of loss of awareness/confusion, until 6 months event-free 4. Follow-up in 5-6 months, call for any changes

## 2016-12-16 ENCOUNTER — Ambulatory Visit: Payer: Medicare Other | Admitting: Internal Medicine

## 2016-12-17 ENCOUNTER — Other Ambulatory Visit (INDEPENDENT_AMBULATORY_CARE_PROVIDER_SITE_OTHER): Payer: Medicare Other

## 2016-12-17 ENCOUNTER — Ambulatory Visit (INDEPENDENT_AMBULATORY_CARE_PROVIDER_SITE_OTHER): Payer: Medicare Other | Admitting: Internal Medicine

## 2016-12-17 ENCOUNTER — Telehealth: Payer: Self-pay | Admitting: Endocrinology

## 2016-12-17 ENCOUNTER — Ambulatory Visit (INDEPENDENT_AMBULATORY_CARE_PROVIDER_SITE_OTHER)
Admission: RE | Admit: 2016-12-17 | Discharge: 2016-12-17 | Disposition: A | Discharge status: Home / Self Care | Payer: Medicare Other | Source: Ambulatory Visit | Attending: Internal Medicine | Admitting: Internal Medicine

## 2016-12-17 ENCOUNTER — Encounter: Payer: Self-pay | Admitting: Internal Medicine

## 2016-12-17 VITALS — BP 120/62 | HR 56 | Ht 70.0 in | Wt 189.4 lb

## 2016-12-17 DIAGNOSIS — I2510 Atherosclerotic heart disease of native coronary artery without angina pectoris: Secondary | ICD-10-CM

## 2016-12-17 DIAGNOSIS — J45991 Cough variant asthma: Secondary | ICD-10-CM

## 2016-12-17 DIAGNOSIS — R05 Cough: Secondary | ICD-10-CM | POA: Diagnosis not present

## 2016-12-17 LAB — CBC WITH DIFFERENTIAL/PLATELET
BASOS ABS: 0.1 10*3/uL (ref 0.0–0.1)
Basophils Relative: 0.7 % (ref 0.0–3.0)
EOS ABS: 0.3 10*3/uL (ref 0.0–0.7)
Eosinophils Relative: 3 % (ref 0.0–5.0)
HEMATOCRIT: 38.1 % — AB (ref 39.0–52.0)
HEMOGLOBIN: 12.2 g/dL — AB (ref 13.0–17.0)
LYMPHS PCT: 15.8 % (ref 12.0–46.0)
Lymphs Abs: 1.5 10*3/uL (ref 0.7–4.0)
MCHC: 32 g/dL (ref 30.0–36.0)
MCV: 89.5 fl (ref 78.0–100.0)
MONOS PCT: 5.8 % (ref 3.0–12.0)
Monocytes Absolute: 0.5 10*3/uL (ref 0.1–1.0)
NEUTROS ABS: 7 10*3/uL (ref 1.4–7.7)
Neutrophils Relative %: 74.7 % (ref 43.0–77.0)
Platelets: 173 10*3/uL (ref 150.0–400.0)
RBC: 4.25 Mil/uL (ref 4.22–5.81)
RDW: 16.1 % — ABNORMAL HIGH (ref 11.5–15.5)
WBC: 9.4 10*3/uL (ref 4.0–10.5)

## 2016-12-17 MED ORDER — PANTOPRAZOLE SODIUM 40 MG PO TBEC
40.0000 mg | DELAYED_RELEASE_TABLET | Freq: Every day | ORAL | 2 refills | Status: AC
Start: 2016-12-17 — End: ?

## 2016-12-17 MED ORDER — BUDESONIDE-FORMOTEROL FUMARATE 160-4.5 MCG/ACT IN AERO: 2.0000 | INHALATION_SPRAY | Freq: Two times a day (BID) | RESPIRATORY_TRACT | 0 refills | Status: DC

## 2016-12-17 MED ORDER — METHYLPREDNISOLONE ACETATE 80 MG/ML IJ SUSP
120.0000 mg | Freq: Once | INTRAMUSCULAR | Status: AC
  Administered 2016-12-17: 120 mg via INTRAMUSCULAR

## 2016-12-17 MED ORDER — FAMOTIDINE 20 MG PO TABS: ORAL_TABLET | ORAL | 2 refills | Status: AC

## 2016-12-17 NOTE — Telephone Encounter (Signed)
Need refill for medication oxycodone

## 2016-12-17 NOTE — Telephone Encounter (Signed)
ok 

## 2016-12-17 NOTE — Patient Instructions (Addendum)
Best cough medicine for you is mucinex dm up to 1200 mg  Every 12 hours as needed and use the flutter as much as you possibly can   Pantoprazole (protonix) 40 mg   Take  30-60 min before first meal of the day and Pepcid (famotidine)  20 mg one @  bedtime until return to office - this is the best way to tell whether stomach acid is contributing to your problem.    GERD (REFLUX)  is an extremely common cause of respiratory symptoms just like yours , many times with no obvious heartburn at all.    It can be treated with medication, but also with lifestyle changes including elevation of the head of your bed (ideally with 6 inch  bed blocks),  Smoking cessation, avoidance of late meals, excessive alcohol, and avoid fatty foods, chocolate, peppermint, colas, red wine, and acidic juices such as orange juice.  NO MINT OR MENTHOL PRODUCTS SO NO COUGH DROPS   USE SUGARLESS CANDY INSTEAD (Jolley ranchers or Stover's or Life Savers) or even ice chips will also do - the key is to swallow to prevent all throat clearing. NO OIL BASED VITAMINS - use powdered substitutes.   Work on Doctor, hospital technique:  relax and gently blow all the way out then take a nice smooth deep breath back in, triggering the inhaler at same time you start breathing in.  Hold for up to 5 seconds if you can. Blow out thru nose. Rinse and gargle with water when done  Be consistent with symbiocrt 160 Take 2 puffs first thing in am and then another 2 puffs about 12 hours later  Only use your albuterol as a rescue medication to be used if you can't catch your breath by resting or doing a relaxed purse lip breathing pattern.  - The less you use it, the better it will work when you need it. - Ok to use up to 2 puffs  every 4 hours if you must but call for immediate appointment if use goes up over your usual need - Don't leave home without it !!  (think of it like the spare tire for your car)    Please remember to go to the lab and  x-ray department downstairs in the basement  for your tests - we will call you with the results when they are available.     See Tammy NP w/in 2 weeks or next available  with all your medications, even over the counter meds, separated in two separate bags, the ones you take no matter what vs the ones you stop once you feel better and take only as needed when you feel you need them.   Tammy  will generate for you a new user friendly medication calendar that will put [place] all on the same page re: your medication use.     Without this process, it simply isn't possible to assure that we are providing  your outpatient care  with  the attention to detail we feel you deserve.   If we cannot assure that you're getting that kind of care,  then we cannot manage your problem effectively from this clinic.  Once you have seen Tammy and we are sure that we're all on the same page with your medication use she will arrange follow up with me with an HRCT in meantime

## 2016-12-17 NOTE — Telephone Encounter (Signed)
Please advise if okay to refill? Last filled on 11/27/2016

## 2016-12-17 NOTE — Progress Notes (Signed)
Subjective:    Patient ID: Barry DIBELLO Sr., male    DOB: 09/19/1932  MRN: [id number]   Brief patient profile: 7 yowm never smoker with coughing daily x all his life but did not affect athletics through HS not better with inhalers in past  and worse since 2014 so referred by Dr Dwyane Dee 10/12/2013 to pulmonary clinic for cough eval.    History of Present Illness  10/12/2013 1st [facility] office visit/ Barry Mcdaniel  Chief Complaint  Patient presents with  . Pulmonary Consult    Referred per Dr. [name]. Pt c/o cough "ever since I was born".  Cough is prod with minimal dark grey sputum. Cough seems worse at night.  He also c/o CP "when I exercise too much"- sharp and dull pain.   extremely difficult hx, very evasive. His dark grey mucus was coughed up during the exam and is clearly quite yellow, not even close to a gray shade, thick x one tsp after a coughing fit Rec Levaquin 500 mg daily x 10 days and then do sinus CT> neg  Continue nasacort twice daily  Prednisone 10 mg take  4 each am x 2 days,   2 each am x 2 days,  1 each am x 2 days and stop  Pantoprazole (protonix) 40 mg   Take 30-60 min before first meal of the day and Pepcid 20 mg one bedtime until return to office    11/16/2013 f/u ov/Barry Mcdaniel re:  Chief Complaint  Patient presents with  . Follow-up    Pt states he has had no change since last visit.  Still c/o prod cough with dark gray mucus.   was transiently much better while on pred then worse w/in a few days of stopping    Kouffman Reflux v Neurogenic Cough Differentiator Reflux Comments  Do you awaken from a sound sleep coughing violently?                            With trouble breathing? Yes   Do you have choking episodes when you cannot  Get enough air, gasping for air ?              no   Do you usually cough when you lie down into  The bed, or when you just lie down to rest ?                          no   Do you usually cough after meals or eating?         No    Do  you cough when (or after) you bend over?    no   GERD SCORE     Kouffman Reflux v Neurogenic Cough Differentiator Neurogenic   Do you more-or-less cough all day long? yes   Does change of temperature make you cough? yes   Does laughing or chuckling cause you to cough? no   Do fumes (perfume, automobile fumes, burned  Toast, etc.,) cause you to cough ?      grease   Does speaking, singing, or talking on the phone cause you to cough   ?               No    Neurogenic/Airway score    rec Symbicort 80 Take 2 puffs first thing in am and then another 2 puffs about 12 hours later.  Prednisone Take 4 for two days three for two days two for two days one for two days   12/14/2013 f/u ov/Barry Mcdaniel re: chronic cough pred responsive Chief Complaint  Patient presents with  . Follow-up    Pt states that his cough has improved some- still producing some yellow/grey sputum.  No new co's today.   Not limited by breathing from desired activities  But very sedentary  rec Work on inhaler technique:  Continue symbicort 80 Take 2 puffs first thing in am and then another 2 puffs about 12 hours later     12/28/2013 f/u ov/Barry Mcdaniel re: chronic cough pred responsive on symbicort 80 2bid  Chief Complaint  Patient presents with  . Follow-up    Cough is some better and breathing is doing well. No new co's today.    Still very inconsistent hfa but reports improving and hasn't really lost much ground since last pred rx completed now that on symbicort 80 2bid - very easily confused with meds "I've got 10 doctors ordering them" >>increaesd symbicort 160 2bid  > med reconciliation/ gen of med cal   01/25/2014 Follow up and Med Review /Cough Patient returns for a one-month follow-up and medication review. We reviewed all his medications organized them into a medication calendar patient education Last visit. Patient was instructed to increase his Symbicort up to 160 However, it doesn't appear that this was done. Since  last visit. Patient feels that his cough has improved. He denies any chest pain, orthopnea, PND or leg swelling. rec Increase Symbicort 160/4.27mcg 2 puffs twice daily, rinse after use. Follow med calendar closely and bring to each visit. Did not return      12/17/2016    Ext ov/Barry Mcdaniel re:  Re-establish  On symbicort 160 / not following med calendar, again confused with meds Chief Complaint  Patient presents with  . Follow-up    has not been seen since 2015, cough variant asthma, constant congeation, constant cough, some mucus light yellow in color.  cough all his life "no worse than usual"  Though wife has different opinion  Chest tight at hs x years  Not clear whether cabg helped or not but he's  certain he had this prior to cabg and after as well  Last 3 months coughs worse but no worse at hs or first thing in am but more productive than prior and  < 1 tbps in am  saba helps some  Has flutter but not using   Not limited by breathing from desired activities     No obvious day to day or daytime variability or assoc   mucus plugs or hemoptysis or cp or chest tightness, subjective wheeze or overt sinus or hb symptoms. No unusual exp hx or h/o childhood pna/ asthma or knowledge of premature birth.  Sleeping ok flat without nocturnal  or early am exacerbation  of respiratory  c/o's or need for noct saba. Also denies any obvious fluctuation of symptoms with weather or environmental changes or other aggravating or alleviating factors except as outlined above   Current Allergies, Complete Past Medical History, Past Surgical History, Family History, and Social History were reviewed in [employer] record.  ROS  The following are not active complaints unless bolded Hoarseness, sore throat, dysphagia, dental problems, itching, sneezing,  nasal congestion or discharge of excess mucus or purulent secretions, ear ache,   fever, chills, sweats, unintended wt loss or wt gain,  classically pleuritic or exertional cp,  orthopnea pnd  or leg swelling, presyncope, palpitations, abdominal pain, anorexia, nausea, vomiting, diarrhea  or change in bowel habits or change in bladder habits, change in stools or change in urine, dysuria, hematuria,  rash, arthralgias, visual complaints, headache, numbness, weakness or ataxia or problems with walking or coordination,  change in mood/affect or memory.        Current Meds  Medication Sig  . acetaminophen (TYLENOL) 500 MG tablet Take 500 mg by mouth every 8 (eight) hours as needed for mild pain, moderate pain or headache.   . albuterol (PROVENTIL HFA;VENTOLIN HFA) 108 (90 Base) MCG/ACT inhaler Inhale 2 puffs into the lungs every 6 (six) hours as needed for wheezing or shortness of breath.  Marland Kitchen aspirin EC 81 MG tablet Take 81 mg by mouth daily.   . budesonide-formoterol (SYMBICORT) 160-4.5 MCG/ACT inhaler Inhale 2 puffs into the lungs 2 (two) times daily.  Marland Kitchen ELIQUIS 5 MG TABS tablet TAKE ONE TABLET BY MOUTH TWICE DAILY  . ferrous sulfate 325 (65 FE) MG EC tablet Take 325 mg by mouth daily with supper.   . finasteride (PROSCAR) 5 MG tablet Take 5 mg by mouth daily.   Marland Kitchen gabapentin (NEURONTIN) 300 MG capsule TAKE 1 CAPSULE BY MOUTH 4 TIMES DAILY  . HUMALOG MIX 75/25 (75-25) 100 UNIT/ML SUSP injection Take 30 units subcutaneously daily with breakfast and 26 units with supper  . insulin lispro (HUMALOG KWIKPEN) 100 UNIT/ML KiwkPen If blood sugar is 250+, take 10 units, if blood sugar is over 350, take 15 units. Dx code E11.9  . levothyroxine (SYNTHROID, LEVOTHROID) 112 MCG tablet TAKE 1 TABLET BY MOUTH ONCE DAILY  . menthol-thymol (ABSORBINE JR) LIQD Apply 1 application topically 3 (three) times daily as needed (pain). For pain.  . nitroGLYCERIN (NITROSTAT) 0.4 MG SL tablet DISSOLVE ONE TABLET UNDER THE TONGUE EVERY 5 MINUTES AS NEEDED FOR CHEST PAIN.  DO NOT EXCEED A TOTAL OF 3 DOSES IN 15 MINUTES  . oxyCODONE-acetaminophen (ROXICET) 5-325 MG  tablet Take 2 tablets by mouth every 8 (eight) hours as needed for severe pain.  Vladimir Faster Glycol-Propyl Glycol (SYSTANE) 0.4-0.3 % SOLN Apply 1-2 drops to eye 3 (three) times daily as needed (for dry eyes).  . polyethylene glycol (MIRALAX / GLYCOLAX) packet Take 17 g by mouth daily as needed (constipation). Mix in 8 oz liquid and drink  . rosuvastatin (CRESTOR) 10 MG tablet Take 1 tablet (10 mg total) by mouth daily.  Nelva Nay SOLOSTAR 300 UNIT/ML SOPN Inject 20 units in the morning  . triamcinolone (NASACORT AQ) 55 MCG/ACT AERO nasal inhaler Place 2 sprays into the nose daily.   . vitamin B-12 (CYANOCOBALAMIN) 1000 MCG tablet Take 1,000 mcg by mouth daily.  . [DISCONTINUED] budesonide-formoterol (SYMBICORT) 160-4.5 MCG/ACT inhaler Inhale 2 puffs into the lungs 2 (two) times daily.                           Objective:   Physical Exam  Elderly wm  Who  failed to answer a single question asked in a straightforward manner, tending to go off on tangents or answer questions with ambiguous medical terms or diagnoses and seemed aggravated  when asked the same question more than once for clarification  "when it did this problem start/ get worse"  " it's my allergies/ my bronchitis is back"    11/16/13   181 > 12/16/2013   192 > 12/28/2013  190 >191 01/25/2014   >  12/17/2016  189     HEENT: nl   turbinates bilaterally, and oropharynx. Nl external ear canals without cough reflex - edentulous    NECK :  without JVD/Nodes/TM/ nl carotid upstrokes bilaterally   LUNGS: no acc muscle use,  Nl contour chest with coarse  insp and exp rhonchi esp in bases bilaterally    CV:  RRR  no s3 or murmur or increase in P2, and no edema   ABD:  soft and nontender with nl inspiratory excursion in the supine position. No bruits or organomegaly appreciated, bowel sounds nl  MS:  Nl gait/ ext warm without deformities, calf tenderness, cyanosis or clubbing No obvious joint restrictions   SKIN: warm and  dry without lesions    NEURO:  alert, approp, nl sensorium with  no motor or cerebellar deficits apparent.         Sinus MRI  07/17/16  Sinuses/Orbits: Stable and negative.   CXR PA and Lateral:   12/17/2016 :    I personally reviewed images and agree with radiology impression as follows:    Bronchitic changes without infiltrate.   Labs ordered 12/17/2016   Allergy profile       Assessment & Plan:

## 2016-12-18 ENCOUNTER — Encounter: Payer: Self-pay | Admitting: Neurology

## 2016-12-18 ENCOUNTER — Ambulatory Visit (INDEPENDENT_AMBULATORY_CARE_PROVIDER_SITE_OTHER): Payer: Medicare Other | Admitting: Neurology

## 2016-12-18 DIAGNOSIS — R569 Unspecified convulsions: Secondary | ICD-10-CM | POA: Diagnosis not present

## 2016-12-18 DIAGNOSIS — G3184 Mild cognitive impairment, so stated: Secondary | ICD-10-CM

## 2016-12-18 LAB — RESPIRATORY ALLERGY PROFILE REGION II ~~LOC~~
ALLERGEN, CEDAR TREE, T6: 0.12 kU/L — AB
Allergen, Cottonwood, t14: 0.1 kU/L — ABNORMAL HIGH
Bermuda Grass: 0.11 kU/L — ABNORMAL HIGH
Box Elder IgE: <0.1 kU/L
CAT DANDER: 1.01 kU/L — AB
CLASS: 0
CLASS: 0
CLASS: 0
CLASS: 0
CLASS: 0
CLASS: 0
CLASS: 0
CLASS: 0
CLASS: 0
CLASS: 0
COCKROACH: 0.15 kU/L — AB
Class: 0
Class: 0
Class: 0
Class: 0
Class: 0
Class: 0
Class: 0
Class: 0
Class: 0
Class: 0
Class: 0
Class: 0
Class: 0
Class: 2
Dog Dander: 0.31 kU/L — ABNORMAL HIGH
Elm IgE: 0.12 kU/L — ABNORMAL HIGH
IgE (Immunoglobulin E), Serum: 54 kU/L (ref <=114)
Johnson Grass: <0.1 kU/L
Pecan/Hickory Tree IgE: <0.1 kU/L
Sheep Sorrel IgE: 0.11 kU/L — ABNORMAL HIGH
Timothy Grass: <0.1 kU/L

## 2016-12-18 LAB — INTERPRETATION:

## 2016-12-18 NOTE — Assessment & Plan Note (Addendum)
- Sinus CT  10/28/13 > Clear paranasal sinuses. - 11/18/13  trial of symbicort 80 2bid  - 12/28/2013   try symbicort 160 2bid  Sinus MRI  07/17/16 : Sinuses  Negative. - 12/17/2016  After extensive coaching HFA effectiveness =    90% from a baseline of 50%  - Allergy profile 12/17/2016 >  Eos 0.3 /  IgE  Pending   Lack of cough resolution on a verified empirical regimen (which we have not actually been able to achieve here) could mean an alternative diagnosis, persistence of the disease state (eg sinusitis- ruled out x 2 since followed here or bronchiectasis which may require HRCT to sort out) , or inadequacy of currently available therapy (eg no medical rx available for non-acid gerd made worse by coughing itself).   DDX of  difficult airways management almost all start with A and  include Adherence, Ace Inhibitors, Acid Reflux, Active Sinus Disease, Alpha 1 Antitripsin deficiency, Anxiety masquerading as Airways dz,  ABPA,  Allergy(esp in young), Aspiration (esp in elderly), Adverse effects of meds,  Active smokers, A bunch of PE's (a small clot burden can't cause this syndrome unless there is already severe underlying pulm or vascular dz with poor reserve) plus two Bs  = Bronchiectasis and Beta blocker use..and one C= CHF  Adherence is always the initial "prime suspect" and is a multilayered concern that requires a "trust but verify" approach in every patient - starting with knowing how to use medications, especially inhalers, correctly, keeping up with refills and understanding the fundamental difference between maintenance and prns vs those medications only taken for a very short course and then stopped and not refilled.  - see hfa teaching  - return with all meds in hand using a trust but verify approach to confirm accurate Medication  Reconciliation The principal here is that until we are certain that the  patients are doing what we've asked, it makes no sense to ask them to do more.   ? Acid (or  non-acid) GERD > always difficult to exclude as up to 75% of pts in some series report no assoc GI/ Heartburn symptoms> rec max (24h)  acid suppression and diet restrictions/ reviewed and instructions given in writing.    ? Allergy > rx depomedrol 120 mg IM (he's not really allergic but warned he may need adj up prn insulin for a few days)  continue symb 80 2bid used more consistently, effectively and do profile   ? Active sinus dz/ rhinitis > neg x 2 sinus studies but could have rhinitis/ pnds > rx nasal steroids for now  ? Anxiety/depression/cognitive limitation> may interfere with successful self rx - need wife's help and ov for med rec.  To keep things simple, I have asked the patient to first separate medicines that are perceived as maintenance, that is to be taken daily "no matter what", from those medicines that are taken on only on an as-needed basis and I have given the patient examples of both, and then return to see our NP to generate a  detailed  medication calendar which should be followed until the next physician sees the patient and updates it.    ? Bronchiectasis > consider hrct next     I had an extended discussion with the patient reviewing all relevant studies completed to date and  lasting 25 minutes of a 40  minute office  visit to re-establish    re  severe non-specific but potentially very serious refractory respiratory  symptoms of uncertain and potentially multiple  etiologies.   Each maintenance medication was reviewed in detail including most importantly the difference between maintenance and prns and under what circumstances the prns are to be triggered using an action plan format that is not reflected in the computer generated alphabetically organized AVS.    Please see AVS for specific instructions unique to this office visit that I personally wrote and verbalized to the the pt in detail and then reviewed with pt  by my nurse highlighting any changes in therapy/plan of  care  recommended at today's visit.

## 2016-12-19 ENCOUNTER — Other Ambulatory Visit: Payer: Self-pay

## 2016-12-19 MED ORDER — OXYCODONE-ACETAMINOPHEN 5-325 MG PO TABS: 2.0000 | ORAL_TABLET | Freq: Three times a day (TID) | ORAL | 0 refills | Status: DC | PRN

## 2016-12-19 NOTE — Telephone Encounter (Signed)
Called patient and let him know that his prescription is ready for pick up at the front desk at our office.

## 2016-12-19 NOTE — Telephone Encounter (Signed)
I have placed this printed copy on Dr. Ronnie Derby desk for him to sign and I will fax to the pharmacy.

## 2016-12-23 ENCOUNTER — Telehealth: Payer: Self-pay

## 2016-12-23 NOTE — Telephone Encounter (Signed)
Pt's wife returned call.  Relayed message below.

## 2016-12-23 NOTE — Telephone Encounter (Signed)
-----   Message from Cameron Sprang, MD sent at 12/23/2016 11:08 AM EDT ----- Pls let wife know the EEG did not show any seizure discharges, his heart rate was pretty low though. It sounds like his Cardiologist wanted to do a holter monitor for his heart, recommend he contact his cardiologist to discuss low heart rate. Thanks

## 2016-12-23 NOTE — Telephone Encounter (Signed)
LMOM asking for pt to return call to the office

## 2016-12-23 NOTE — Procedures (Signed)
ELECTROENCEPHALOGRAM REPORT  Date of Study: 12/18/2016  Patient's Name: Barry BRUBACHER Sr. MRN: [id number] Date of Birth: 05/21/1932  Referring Provider: Dr. Ellouise Newer  Clinical History: This is an 83 year old man with new onset episodes of body jerking with retained awareness. After a cluster of them, he had an episode of being in a "trance" talking to deceased people for a few minutes.  Medications: TYLENOL    PROVENTIL HFA;VENTOLIN HFA aspirin EC 81 MG tablet SYMBICORT 160-4.5 MCG/ACT inhaler ELIQUIS 5 MG TABS  65 FE MG EC tablet  PROSCAR 5 MG tablet NEURONTIN 300 MG capsule  HUMALOG MIX 75/25 (75-25) 100 UNIT/ML SUSP injection HUMALOG KWIKPEN 100 UNIT/ML  SYNTHROID, LEVOTHROID 112 MCG tablet  ABSORBINE JR LIQD  NITROSTAT 0.4 MG SL tablet  ROXICET 5-325 MG tablet  SYSTANE 0.4-0.3 % SOLN  MIRALAX / GLYCOLAX packet  CRESTOR 10 MG tablet TOUJEO SOLOSTAR 300 UNIT/ML SOPN  NASACORT AQ 55 MCG/ACT AERO   CYANOCOBALAMIN  Technical Summary: A multichannel digital 1-hour EEG recording measured by the international 10-20 system with electrodes applied with paste and impedances below 5000 ohms performed in our laboratory with EKG monitoring in an awake and asleep patient.  Hyperventilation was not performed. Photic stimulation was performed.  The digital EEG was referentially recorded, reformatted, and digitally filtered in a variety of bipolar and referential montages for optimal display.    Description: The patient is awake and asleep during the recording.  During maximal wakefulness, there is a symmetric, medium voltage 8-8.5 Hz posterior dominant rhythm that attenuates with eye opening.  The record is symmetric.  During drowsiness and sleep, central beta activity is seen. There is an increase in theta and delta slowing of the background, at times sharply contoured without clear epileptogenic potential.  Vertex waves and symmetric sleep spindles were seen.  Photic stimulation  did not elicit any abnormalities.  There were no epileptiform discharges or electrographic seizures seen.    EKG lead showed bradycardia at 48 bpm while awake, 36 bpm while asleep, at times with extrasystolic beats seen.  Impression: This 1-hour awake and asleep EEG is within normal limits. Note of significant bradycardia on 1-lead EKG.  Clinical Correlation: A normal EEG does not exclude a clinical diagnosis of epilepsy.  If further clinical questions remain, prolonged EEG may be helpful.  Clinical correlation is advised.   Ellouise Newer, M.D.

## 2016-12-26 ENCOUNTER — Other Ambulatory Visit (INDEPENDENT_AMBULATORY_CARE_PROVIDER_SITE_OTHER): Payer: Medicare Other

## 2016-12-26 ENCOUNTER — Other Ambulatory Visit: Payer: Self-pay

## 2016-12-26 DIAGNOSIS — Z1211 Encounter for screening for malignant neoplasm of colon: Secondary | ICD-10-CM

## 2016-12-27 ENCOUNTER — Other Ambulatory Visit: Payer: Medicare Other

## 2016-12-27 DIAGNOSIS — R351 Nocturia: Secondary | ICD-10-CM | POA: Diagnosis not present

## 2016-12-27 DIAGNOSIS — N390 Urinary tract infection, site not specified: Secondary | ICD-10-CM | POA: Diagnosis not present

## 2016-12-27 DIAGNOSIS — R350 Frequency of micturition: Secondary | ICD-10-CM | POA: Diagnosis not present

## 2016-12-27 DIAGNOSIS — C61 Malignant neoplasm of prostate: Secondary | ICD-10-CM | POA: Diagnosis not present

## 2016-12-27 LAB — FECAL OCCULT BLOOD, IMMUNOCHEMICAL: FECAL OCCULT BLD: NEGATIVE

## 2016-12-28 ENCOUNTER — Other Ambulatory Visit: Payer: Self-pay | Admitting: Endocrinology

## 2016-12-31 ENCOUNTER — Ambulatory Visit (INDEPENDENT_AMBULATORY_CARE_PROVIDER_SITE_OTHER): Payer: Medicare Other | Admitting: Adult Health

## 2016-12-31 ENCOUNTER — Encounter: Payer: Self-pay | Admitting: Adult Health

## 2016-12-31 VITALS — BP 128/66 | HR 59 | Ht 71.0 in | Wt 189.4 lb

## 2016-12-31 DIAGNOSIS — J45991 Cough variant asthma: Secondary | ICD-10-CM

## 2016-12-31 DIAGNOSIS — R05 Cough: Secondary | ICD-10-CM | POA: Diagnosis not present

## 2016-12-31 DIAGNOSIS — R059 Cough, unspecified: Secondary | ICD-10-CM

## 2016-12-31 LAB — NITRIC OXIDE: Nitric Oxide: <5

## 2016-12-31 MED ORDER — BUDESONIDE-FORMOTEROL FUMARATE 160-4.5 MCG/ACT IN AERO: 2.0000 | INHALATION_SPRAY | Freq: Two times a day (BID) | RESPIRATORY_TRACT | 0 refills | Status: AC

## 2016-12-31 NOTE — Patient Instructions (Addendum)
Increase Symbicort 160/4.61mcg 2 puffs twice daily, rinse after use. Follow med calendar closely and bring to each visit. Set up HRCT chest.  Follow up with Dr. Melvyn Novas in 2-3 months and As needed

## 2016-12-31 NOTE — Progress Notes (Signed)
@Patient  ID: Barry Officer Sr., male    DOB: 21-Sep-1932, 83 y.o.   MRN: [id number]  No chief complaint on file.   Referring provider: [name], MD  HPI: 83 yo male never smoker followed for Severe chronic asthma  /Chronic cough    TEST  Sinus CT  10/28/13 > Clear paranasal sinuses. Sinus MRI  07/17/16 : Sinuses  Negative. - Allergy profile 12/17/2016 >  Eos 0.3 /  IgE  54 , +cat, dog , cockroach.  PFT 11/2015 FEV1 33%, ratio 45 , FVC 52%.  2-D echo May 2018 EF 55-60%    12/31/2016 Follow up: Asthma/Chronic cough  Patient presents for a one-month follow-up. Patient was seen last visit for increased cough for 3 months. Recommended to use Symbicort twice daily. Lab work showed IgE 54, eosinophils 300. Chest x-ray showed bronchitic changes/  He was started on mucinex dm .and flutter valve.  Patient feels he is some better. Has intermittent cough . Pt has some memory issues. Wife helps him.  Denies any chest pain, orthopnea, PND, or increased leg swelling. Spirometry today shows FEV1 at 37%, ratio 50 , FVC 53%.  Exhaled nitric oxide test today was <5.     Allergies  Allergen Reactions  . Penicillins Swelling    Eyes and lips Has patient had a PCN reaction causing immediate rash, facial/tongue/throat swelling, SOB or lightheadedness with hypotension: Yes Has patient had a PCN reaction causing severe rash involving mucus membranes or skin necrosis: No Has patient had a PCN reaction that required hospitalization No Has patient had a PCN reaction occurring within the last 10 years: No If all of the above answers are "NO", then may proceed with Cephalosporin use   . Prednisone Other (See Comments)    Hyperglycemia and AMS    Immunization History  Administered Date(s) Administered  . Influenza, High Dose Seasonal PF 11/24/2014, 12/15/2015, 12/09/2016  . Influenza,inj,Quad PF,6+ Mos 12/17/2012, 11/23/2013    Past Medical History:  Diagnosis Date  . Anemia    hx of year ago  no problems now per pt   . Arthritis    generalized   . Asthma   . Atypical chest pain   . Bladder neck contracture   . Chronic back pain   . Complication of anesthesia    POST OP HYPOTENSION  . Dementia    per wife pt had neurological evaluation  . Diverticulosis of colon   . First degree heart block   . GERD (gastroesophageal reflux disease)   . Heart murmur   . History of basal cell carcinoma excision    forehead and x6 area forearms  . History of Bell's palsy    x2  1998 &  2004 left side --  residual slight left eye droop and vace  . History of chronic bronchitis   . History of colon polyps   . History of gout   . History of hypertension   . History of MI (myocardial infarction)    08/ 2010  secondary acute renal failure/ dehydration--  medical management  . History of pulmonary embolus (PE)    2010  . Hypogonadism male   . Hypotension   . Hypothyroidism   . OSA (obstructive sleep apnea)    severe per study 2007/  non-compliant cpap  . Peripheral vascular disease ([place])   . Prostate carcinoma [facility]) urologist-  dr Gaynelle Arabian   DX 2007  Gleason 6  and recurrent 2012  s/p  cryoablation of prostate both  times  . RBBB (right bundle branch block)   . Recovering alcoholic in remission ([place])   . Renal calculus, bilateral   . Type 2 diabetes mellitus ([place])     Tobacco History: History  Smoking Status  . Never Smoker  Smokeless Tobacco  . Never Used   Counseling given: Not Answered   Outpatient Encounter Prescriptions as of 12/31/2016  Medication Sig  . acetaminophen (TYLENOL) 500 MG tablet Take 500 mg by mouth every 8 (eight) hours as needed for mild pain, moderate pain or headache.   . albuterol (PROVENTIL HFA;VENTOLIN HFA) 108 (90 Base) MCG/ACT inhaler Inhale 2 puffs into the lungs every 6 (six) hours as needed for wheezing or shortness of breath.  Marland Kitchen aspirin EC 81 MG tablet Take 81 mg by mouth daily.   . budesonide-formoterol (SYMBICORT) 160-4.5 MCG/ACT inhaler  Inhale 2 puffs into the lungs 2 (two) times daily.  . budesonide-formoterol (SYMBICORT) 160-4.5 MCG/ACT inhaler Inhale 2 puffs into the lungs 2 (two) times daily.  Marland Kitchen ELIQUIS 5 MG TABS tablet TAKE ONE TABLET BY MOUTH TWICE DAILY  . famotidine (PEPCID) 20 MG tablet One at bedtime  . ferrous sulfate 325 (65 FE) MG EC tablet Take 325 mg by mouth daily with supper.   . finasteride (PROSCAR) 5 MG tablet Take 5 mg by mouth daily.   Marland Kitchen gabapentin (NEURONTIN) 300 MG capsule TAKE 1 CAPSULE BY MOUTH 4 TIMES DAILY  . HUMALOG MIX 75/25 (75-25) 100 UNIT/ML SUSP injection Take 30 units subcutaneously daily with breakfast and 26 units with supper  . insulin lispro (HUMALOG KWIKPEN) 100 UNIT/ML KiwkPen If blood sugar is 250+, take 10 units, if blood sugar is over 350, take 15 units. Dx code E11.9  . Insulin Lispro Prot & Lispro (HUMALOG MIX 75/25 KWIKPEN) (75-25) 100 UNIT/ML Kwikpen Take 30 units subcutaneously daily with breakfast and 26 units with supper  . levothyroxine (SYNTHROID, LEVOTHROID) 112 MCG tablet TAKE 1 TABLET BY MOUTH ONCE DAILY  . menthol-thymol (ABSORBINE JR) LIQD Apply 1 application topically 3 (three) times daily as needed (pain). For pain.  . nitroGLYCERIN (NITROSTAT) 0.4 MG SL tablet DISSOLVE ONE TABLET UNDER THE TONGUE EVERY 5 MINUTES AS NEEDED FOR CHEST PAIN.  DO NOT EXCEED A TOTAL OF 3 DOSES IN 15 MINUTES  . oxyCODONE-acetaminophen (ROXICET) 5-325 MG tablet Take 2 tablets by mouth every 8 (eight) hours as needed for severe pain.  . pantoprazole (PROTONIX) 40 MG tablet Take 1 tablet (40 mg total) by mouth daily. Take 30-60 min before first meal of the day  . Polyethyl Glycol-Propyl Glycol (SYSTANE) 0.4-0.3 % SOLN Apply 1-2 drops to eye 3 (three) times daily as needed (for dry eyes).  . polyethylene glycol (MIRALAX / GLYCOLAX) packet Take 17 g by mouth daily as needed (constipation). Mix in 8 oz liquid and drink  . rosuvastatin (CRESTOR) 10 MG tablet Take 1 tablet (10 mg total) by mouth daily.    Nelva Nay SOLOSTAR 300 UNIT/ML SOPN Inject 20 units in the morning  . triamcinolone (NASACORT AQ) 55 MCG/ACT AERO nasal inhaler Place 2 sprays into the nose daily.   . vitamin B-12 (CYANOCOBALAMIN) 1000 MCG tablet Take 1,000 mcg by mouth daily.   No facility-administered encounter medications on file as of 12/31/2016.      Review of Systems  Constitutional:   No  weight loss, night sweats,  Fevers, chills,  +fatigue, or  lassitude.  HEENT:   No headaches,  Difficulty swallowing,  Tooth/dental problems, or  Sore throat,  No sneezing, itching, ear ache, nasal congestion, post nasal drip,   CV:  No chest pain,  Orthopnea, PND, swelling in lower extremities, anasarca, dizziness, palpitations, syncope.   GI  No heartburn, indigestion, abdominal pain, nausea, vomiting, diarrhea, change in bowel habits, loss of appetite, bloody stools.   Resp:    No chest wall deformity  Skin: no rash or lesions.  GU: no dysuria, change in color of urine, no urgency or frequency.  No flank pain, no hematuria   MS:  No joint pain or swelling.  No decreased range of motion.  No back pain.    Physical Exam  BP 128/66 (BP Location: Left Arm, Cuff Size: Normal)   Pulse (!) 59   Ht 5\' 11"  (1.803 m)   Wt 189 lb 6.4 oz (85.9 kg)   SpO2 98%   BMI 26.42 kg/m   GEN: A/Ox3; pleasant , NAD, elderly    HEENT:  /AT,  EACs-clear, TMs-wnl, NOSE-clear, THROAT-clear, no lesions, no postnasal drip or exudate noted.   NECK:  Supple w/ fair ROM; no JVD; normal carotid impulses w/o bruits; no thyromegaly or nodules palpated; no lymphadenopathy.    RESP  Clear  P & A; w/o, wheezes/ rales/ or rhonchi. no accessory muscle use, no dullness to percussion  CARD:  RRR, no m/r/g, tr peripheral edema, pulses intact, no cyanosis or clubbing.  GI:   Soft & nt; nml bowel sounds; no organomegaly or masses detected.   Musco: Warm bil, no deformities or joint swelling noted.   Neuro: alert, no focal  deficits noted.    Skin: Warm, no lesions or rashes    Lab Results:    ProBNP No results found for: PROBNP  Imaging: Dg Chest 2 View  Result Date: 12/17/2016 CLINICAL DATA:  Cough variant asthma, diabetes mellitus, history prostate cancer, coronary artery disease post MI EXAM: CHEST  2 VIEW COMPARISON:  07/19/2016 FINDINGS: Upper normal size of cardiac silhouette post CABG. Atherosclerotic calcification aorta. Pulmonary vascularity mediastinal contours normal. Clear mild bronchitic changes without infiltrate, pleural effusion or pneumothorax. Bones demineralized. IMPRESSION: Bronchitic changes without infiltrate. Electronically Signed   By: Lavonia Dana M.D.   On: 12/17/2016 21:20     Assessment & Plan:   No problem-specific Assessment & Plan notes found for this encounter.     Rexene Edison, NP 12/31/2016

## 2016-12-31 NOTE — Assessment & Plan Note (Signed)
Pt has severe chronic obstructive asthma  Would like to increase Symbicort to 160.  CXR showed bronchitic changes Check HRCT to r/o ILD  Patient's medications were reviewed today and patient education was given. Computerized medication calendar was adjusted/completed   Plan  Patient Instructions  Increase Symbicort 160/4.76mcg 2 puffs twice daily, rinse after use. Follow med calendar closely and bring to each visit. Set up HRCT chest.  Follow up with Dr. Melvyn Novas in 2-3 months and As needed     '

## 2016-12-31 NOTE — Addendum Note (Signed)
Addended by: Parke Poisson E on: 12/31/2016 05:19 PM   Modules accepted: Orders

## 2017-01-01 ENCOUNTER — Other Ambulatory Visit: Payer: Self-pay

## 2017-01-01 MED ORDER — TOUJEO SOLOSTAR 300 UNIT/ML ~~LOC~~ SOPN: 42.0000 [IU] | PEN_INJECTOR | Freq: Every day | SUBCUTANEOUS | 2 refills | Status: AC

## 2017-01-06 ENCOUNTER — Ambulatory Visit (INDEPENDENT_AMBULATORY_CARE_PROVIDER_SITE_OTHER)
Admission: RE | Admit: 2017-01-06 | Discharge: 2017-01-06 | Disposition: A | Discharge status: Home / Self Care | Payer: Medicare Other | Source: Ambulatory Visit | Attending: Adult Health | Admitting: Adult Health

## 2017-01-06 DIAGNOSIS — R05 Cough: Secondary | ICD-10-CM

## 2017-01-06 DIAGNOSIS — R918 Other nonspecific abnormal finding of lung field: Secondary | ICD-10-CM | POA: Diagnosis not present

## 2017-01-06 DIAGNOSIS — R059 Cough, unspecified: Secondary | ICD-10-CM

## 2017-01-07 NOTE — Addendum Note (Signed)
Addended by: Desmond Dike C on: 01/07/2017 03:32 PM   Modules accepted: Orders

## 2017-01-08 ENCOUNTER — Emergency Department (HOSPITAL_COMMUNITY): Payer: Medicare Other

## 2017-01-08 ENCOUNTER — Inpatient Hospital Stay (HOSPITAL_COMMUNITY): Payer: Medicare Other

## 2017-01-08 ENCOUNTER — Encounter (HOSPITAL_COMMUNITY): Payer: Self-pay | Admitting: Emergency Medicine

## 2017-01-08 DIAGNOSIS — R402213 Coma scale, best verbal response, none, at hospital admission: Secondary | ICD-10-CM | POA: Diagnosis present

## 2017-01-08 DIAGNOSIS — I4901 Ventricular fibrillation: Secondary | ICD-10-CM | POA: Diagnosis not present

## 2017-01-08 DIAGNOSIS — L89629 Pressure ulcer of left heel, unspecified stage: Secondary | ICD-10-CM | POA: Diagnosis not present

## 2017-01-08 DIAGNOSIS — J690 Pneumonitis due to inhalation of food and vomit: Secondary | ICD-10-CM | POA: Diagnosis present

## 2017-01-08 DIAGNOSIS — E785 Hyperlipidemia, unspecified: Secondary | ICD-10-CM | POA: Diagnosis present

## 2017-01-08 DIAGNOSIS — N170 Acute kidney failure with tubular necrosis: Secondary | ICD-10-CM | POA: Diagnosis present

## 2017-01-08 DIAGNOSIS — J9690 Respiratory failure, unspecified, unspecified whether with hypoxia or hypercapnia: Secondary | ICD-10-CM | POA: Diagnosis not present

## 2017-01-08 DIAGNOSIS — Z794 Long term (current) use of insulin: Secondary | ICD-10-CM

## 2017-01-08 DIAGNOSIS — R570 Cardiogenic shock: Secondary | ICD-10-CM | POA: Diagnosis not present

## 2017-01-08 DIAGNOSIS — I340 Nonrheumatic mitral (valve) insufficiency: Secondary | ICD-10-CM | POA: Diagnosis present

## 2017-01-08 DIAGNOSIS — S2242XA Multiple fractures of ribs, left side, initial encounter for closed fracture: Secondary | ICD-10-CM | POA: Diagnosis not present

## 2017-01-08 DIAGNOSIS — Z66 Do not resuscitate: Secondary | ICD-10-CM | POA: Diagnosis present

## 2017-01-08 DIAGNOSIS — I4510 Unspecified right bundle-branch block: Secondary | ICD-10-CM | POA: Diagnosis present

## 2017-01-08 DIAGNOSIS — R4182 Altered mental status, unspecified: Secondary | ICD-10-CM | POA: Diagnosis not present

## 2017-01-08 DIAGNOSIS — I472 Ventricular tachycardia: Secondary | ICD-10-CM | POA: Diagnosis present

## 2017-01-08 DIAGNOSIS — K449 Diaphragmatic hernia without obstruction or gangrene: Secondary | ICD-10-CM | POA: Diagnosis present

## 2017-01-08 DIAGNOSIS — R402313 Coma scale, best motor response, none, at hospital admission: Secondary | ICD-10-CM | POA: Diagnosis present

## 2017-01-08 DIAGNOSIS — Z7951 Long term (current) use of inhaled steroids: Secondary | ICD-10-CM

## 2017-01-08 DIAGNOSIS — J9601 Acute respiratory failure with hypoxia: Secondary | ICD-10-CM | POA: Diagnosis present

## 2017-01-08 DIAGNOSIS — Z86711 Personal history of pulmonary embolism: Secondary | ICD-10-CM

## 2017-01-08 DIAGNOSIS — R402113 Coma scale, eyes open, never, at hospital admission: Secondary | ICD-10-CM | POA: Diagnosis present

## 2017-01-08 DIAGNOSIS — Z961 Presence of intraocular lens: Secondary | ICD-10-CM | POA: Diagnosis present

## 2017-01-08 DIAGNOSIS — K219 Gastro-esophageal reflux disease without esophagitis: Secondary | ICD-10-CM | POA: Diagnosis present

## 2017-01-08 DIAGNOSIS — Z515 Encounter for palliative care: Secondary | ICD-10-CM | POA: Diagnosis not present

## 2017-01-08 DIAGNOSIS — Z8546 Personal history of malignant neoplasm of prostate: Secondary | ICD-10-CM

## 2017-01-08 DIAGNOSIS — Z9841 Cataract extraction status, right eye: Secondary | ICD-10-CM

## 2017-01-08 DIAGNOSIS — Y92009 Unspecified place in unspecified non-institutional (private) residence as the place of occurrence of the external cause: Secondary | ICD-10-CM

## 2017-01-08 DIAGNOSIS — I110 Hypertensive heart disease with heart failure: Secondary | ICD-10-CM | POA: Diagnosis present

## 2017-01-08 DIAGNOSIS — I5021 Acute systolic (congestive) heart failure: Secondary | ICD-10-CM | POA: Diagnosis present

## 2017-01-08 DIAGNOSIS — S2243XA Multiple fractures of ribs, bilateral, initial encounter for closed fracture: Secondary | ICD-10-CM | POA: Diagnosis present

## 2017-01-08 DIAGNOSIS — Z9119 Patient's noncompliance with other medical treatment and regimen: Secondary | ICD-10-CM

## 2017-01-08 DIAGNOSIS — F0390 Unspecified dementia without behavioral disturbance: Secondary | ICD-10-CM | POA: Diagnosis present

## 2017-01-08 DIAGNOSIS — E872 Acidosis: Secondary | ICD-10-CM | POA: Diagnosis present

## 2017-01-08 DIAGNOSIS — I462 Cardiac arrest due to underlying cardiac condition: Secondary | ICD-10-CM | POA: Diagnosis present

## 2017-01-08 DIAGNOSIS — L89619 Pressure ulcer of right heel, unspecified stage: Secondary | ICD-10-CM | POA: Diagnosis not present

## 2017-01-08 DIAGNOSIS — E875 Hyperkalemia: Secondary | ICD-10-CM | POA: Diagnosis not present

## 2017-01-08 DIAGNOSIS — I469 Cardiac arrest, cause unspecified: Secondary | ICD-10-CM | POA: Diagnosis not present

## 2017-01-08 DIAGNOSIS — G931 Anoxic brain damage, not elsewhere classified: Secondary | ICD-10-CM | POA: Diagnosis present

## 2017-01-08 DIAGNOSIS — E039 Hypothyroidism, unspecified: Secondary | ICD-10-CM | POA: Diagnosis present

## 2017-01-08 DIAGNOSIS — Z7901 Long term (current) use of anticoagulants: Secondary | ICD-10-CM

## 2017-01-08 DIAGNOSIS — D638 Anemia in other chronic diseases classified elsewhere: Secondary | ICD-10-CM | POA: Diagnosis present

## 2017-01-08 DIAGNOSIS — S270XXA Traumatic pneumothorax, initial encounter: Secondary | ICD-10-CM | POA: Diagnosis not present

## 2017-01-08 DIAGNOSIS — I4892 Unspecified atrial flutter: Secondary | ICD-10-CM | POA: Diagnosis present

## 2017-01-08 DIAGNOSIS — E1151 Type 2 diabetes mellitus with diabetic peripheral angiopathy without gangrene: Secondary | ICD-10-CM | POA: Diagnosis present

## 2017-01-08 DIAGNOSIS — I2510 Atherosclerotic heart disease of native coronary artery without angina pectoris: Secondary | ICD-10-CM | POA: Diagnosis present

## 2017-01-08 DIAGNOSIS — Z833 Family history of diabetes mellitus: Secondary | ICD-10-CM

## 2017-01-08 DIAGNOSIS — Z4659 Encounter for fitting and adjustment of other gastrointestinal appliance and device: Secondary | ICD-10-CM

## 2017-01-08 DIAGNOSIS — J948 Other specified pleural conditions: Secondary | ICD-10-CM | POA: Diagnosis not present

## 2017-01-08 DIAGNOSIS — R34 Anuria and oliguria: Secondary | ICD-10-CM | POA: Diagnosis present

## 2017-01-08 DIAGNOSIS — Z4682 Encounter for fitting and adjustment of non-vascular catheter: Secondary | ICD-10-CM | POA: Diagnosis not present

## 2017-01-08 DIAGNOSIS — G9341 Metabolic encephalopathy: Secondary | ICD-10-CM | POA: Diagnosis present

## 2017-01-08 DIAGNOSIS — I350 Nonrheumatic aortic (valve) stenosis: Secondary | ICD-10-CM | POA: Diagnosis present

## 2017-01-08 DIAGNOSIS — I509 Heart failure, unspecified: Secondary | ICD-10-CM | POA: Diagnosis not present

## 2017-01-08 DIAGNOSIS — Z85828 Personal history of other malignant neoplasm of skin: Secondary | ICD-10-CM

## 2017-01-08 DIAGNOSIS — G4733 Obstructive sleep apnea (adult) (pediatric): Secondary | ICD-10-CM | POA: Diagnosis present

## 2017-01-08 DIAGNOSIS — Z981 Arthrodesis status: Secondary | ICD-10-CM

## 2017-01-08 DIAGNOSIS — Z951 Presence of aortocoronary bypass graft: Secondary | ICD-10-CM

## 2017-01-08 DIAGNOSIS — Z888 Allergy status to other drugs, medicaments and biological substances status: Secondary | ICD-10-CM

## 2017-01-08 DIAGNOSIS — I252 Old myocardial infarction: Secondary | ICD-10-CM

## 2017-01-08 DIAGNOSIS — I214 Non-ST elevation (NSTEMI) myocardial infarction: Secondary | ICD-10-CM | POA: Diagnosis present

## 2017-01-08 DIAGNOSIS — Z880 Allergy status to penicillin: Secondary | ICD-10-CM

## 2017-01-08 DIAGNOSIS — J45909 Unspecified asthma, uncomplicated: Secondary | ICD-10-CM | POA: Diagnosis present

## 2017-01-08 DIAGNOSIS — J939 Pneumothorax, unspecified: Secondary | ICD-10-CM | POA: Diagnosis not present

## 2017-01-08 DIAGNOSIS — Z7982 Long term (current) use of aspirin: Secondary | ICD-10-CM

## 2017-01-08 DIAGNOSIS — F1021 Alcohol dependence, in remission: Secondary | ICD-10-CM | POA: Diagnosis present

## 2017-01-08 DIAGNOSIS — Z8249 Family history of ischemic heart disease and other diseases of the circulatory system: Secondary | ICD-10-CM

## 2017-01-08 DIAGNOSIS — Z79890 Hormone replacement therapy (postmenopausal): Secondary | ICD-10-CM

## 2017-01-08 LAB — I-STAT CG4 LACTIC ACID, ED
LACTIC ACID, VENOUS: 4.92 mmol/L — AB (ref 0.5–1.9)
Lactic Acid, Venous: 2.41 mmol/L (ref 0.5–1.9)

## 2017-01-08 LAB — BASIC METABOLIC PANEL
Anion gap: 13 (ref 5–15)
Anion gap: 12 (ref 5–15)
BUN: 30 mg/dL — ABNORMAL HIGH (ref 6–20)
BUN: 40 mg/dL — AB (ref 6–20)
CALCIUM: 9.9 mg/dL (ref 8.9–10.3)
CALCIUM: 8.9 mg/dL (ref 8.9–10.3)
CO2: 18 mmol/L — AB (ref 22–32)
CO2: 16 mmol/L — AB (ref 22–32)
CREATININE: 1.97 mg/dL — AB (ref 0.61–1.24)
CREATININE: 2.36 mg/dL — AB (ref 0.61–1.24)
Chloride: 110 mmol/L (ref 101–111)
Chloride: 112 mmol/L — ABNORMAL HIGH (ref 101–111)
GFR calc Af Amer: 35 mL/min — ABNORMAL LOW (ref >60)
GFR calc Af Amer: 28 mL/min — ABNORMAL LOW (ref >60)
GFR, EST NON AFRICAN AMERICAN: 30 mL/min — AB (ref >60)
GFR, EST NON AFRICAN AMERICAN: 24 mL/min — AB (ref >60)
GLUCOSE: 112 mg/dL — AB (ref 65–99)
GLUCOSE: 212 mg/dL — AB (ref 65–99)
Potassium: 4.4 mmol/L (ref 3.5–5.1)
Potassium: 5.3 mmol/L — ABNORMAL HIGH (ref 3.5–5.1)
Sodium: 141 mmol/L (ref 135–145)
Sodium: 140 mmol/L (ref 135–145)

## 2017-01-08 LAB — URINALYSIS, ROUTINE W REFLEX MICROSCOPIC
Bilirubin Urine: NEGATIVE
GLUCOSE, UA: 150 mg/dL — AB
Ketones, ur: NEGATIVE mg/dL
LEUKOCYTES UA: NEGATIVE
Nitrite: NEGATIVE
PH: 6 (ref 5.0–8.0)
Protein, ur: 100 mg/dL — AB
Specific Gravity, Urine: 1.013 (ref 1.005–1.030)

## 2017-01-08 LAB — COMPREHENSIVE METABOLIC PANEL
ALK PHOS: 75 U/L (ref 38–126)
ALT: 348 U/L — AB (ref 17–63)
ANION GAP: 12 (ref 5–15)
AST: 421 U/L — ABNORMAL HIGH (ref 15–41)
Albumin: 2.9 g/dL — ABNORMAL LOW (ref 3.5–5.0)
BILIRUBIN TOTAL: 0.6 mg/dL (ref 0.3–1.2)
BUN: 27 mg/dL — ABNORMAL HIGH (ref 6–20)
CALCIUM: 10.6 mg/dL — AB (ref 8.9–10.3)
CO2: 21 mmol/L — ABNORMAL LOW (ref 22–32)
CREATININE: 2.01 mg/dL — AB (ref 0.61–1.24)
Chloride: 109 mmol/L (ref 101–111)
GFR, EST AFRICAN AMERICAN: 34 mL/min — AB (ref >60)
GFR, EST NON AFRICAN AMERICAN: 29 mL/min — AB (ref >60)
Glucose, Bld: 134 mg/dL — ABNORMAL HIGH (ref 65–99)
Potassium: 4.4 mmol/L (ref 3.5–5.1)
SODIUM: 142 mmol/L (ref 135–145)
TOTAL PROTEIN: 5.6 g/dL — AB (ref 6.5–8.1)

## 2017-01-08 LAB — HEPATIC FUNCTION PANEL
ALBUMIN: 3.2 g/dL — AB (ref 3.5–5.0)
ALT: 380 U/L — ABNORMAL HIGH (ref 17–63)
AST: 475 U/L — ABNORMAL HIGH (ref 15–41)
Alkaline Phosphatase: 77 U/L (ref 38–126)
BILIRUBIN INDIRECT: 0.5 mg/dL (ref 0.3–0.9)
Bilirubin, Direct: 0.2 mg/dL (ref 0.1–0.5)
TOTAL PROTEIN: 6.1 g/dL — AB (ref 6.5–8.1)
Total Bilirubin: 0.7 mg/dL (ref 0.3–1.2)

## 2017-01-08 LAB — APTT
APTT: 27 s (ref 24–36)
APTT: 122 s — AB (ref 24–36)

## 2017-01-08 LAB — TROPONIN I
TROPONIN I: 0.43 ng/mL — AB (ref <0.03)
TROPONIN I: 19.67 ng/mL — AB (ref <0.03)
TROPONIN I: 30.19 ng/mL — AB (ref <0.03)

## 2017-01-08 LAB — PROTIME-INR
INR: 1.34
INR: 1.33
INR: 1.47
PROTHROMBIN TIME: 16.5 s — AB (ref 11.4–15.2)
Prothrombin Time: 16.4 seconds — ABNORMAL HIGH (ref 11.4–15.2)
Prothrombin Time: 17.7 seconds — ABNORMAL HIGH (ref 11.4–15.2)

## 2017-01-08 LAB — CBC WITH DIFFERENTIAL/PLATELET
Basophils Absolute: 0 10*3/uL (ref 0.0–0.1)
Basophils Relative: 0 %
EOS ABS: 0.2 10*3/uL (ref 0.0–0.7)
Eosinophils Relative: 1 %
HCT: 38.6 % — ABNORMAL LOW (ref 39.0–52.0)
HEMOGLOBIN: 11.8 g/dL — AB (ref 13.0–17.0)
LYMPHS ABS: 4.1 10*3/uL — AB (ref 0.7–4.0)
LYMPHS PCT: 24 %
MCH: 27.4 pg (ref 26.0–34.0)
MCHC: 30.6 g/dL (ref 30.0–36.0)
MCV: 89.8 fL (ref 78.0–100.0)
MONOS PCT: 3 %
Monocytes Absolute: 0.5 10*3/uL (ref 0.1–1.0)
Neutro Abs: 12.6 10*3/uL — ABNORMAL HIGH (ref 1.7–7.7)
Neutrophils Relative %: 72 %
Platelets: 174 10*3/uL (ref 150–400)
RBC: 4.3 MIL/uL (ref 4.22–5.81)
RDW: 15.5 % (ref 11.5–15.5)
WBC: 17.5 10*3/uL — AB (ref 4.0–10.5)

## 2017-01-08 LAB — I-STAT ARTERIAL BLOOD GAS, ED
ACID-BASE DEFICIT: 1 mmol/L (ref 0.0–2.0)
Bicarbonate: 25.1 mmol/L (ref 20.0–28.0)
O2 SAT: 100 %
PO2 ART: 536 mmHg — AB (ref 83.0–108.0)
TCO2: 27 mmol/L (ref 22–32)
pCO2 arterial: 46.2 mmHg (ref 32.0–48.0)
pH, Arterial: 7.339 — ABNORMAL LOW (ref 7.350–7.450)

## 2017-01-08 LAB — I-STAT TROPONIN, ED: TROPONIN I, POC: 0.39 ng/mL — AB (ref 0.00–0.08)

## 2017-01-08 LAB — MRSA PCR SCREENING: MRSA BY PCR: NEGATIVE

## 2017-01-08 LAB — GLUCOSE, CAPILLARY
GLUCOSE-CAPILLARY: 130 mg/dL — AB (ref 65–99)
GLUCOSE-CAPILLARY: 203 mg/dL — AB (ref 65–99)
GLUCOSE-CAPILLARY: 264 mg/dL — AB (ref 65–99)
Glucose-Capillary: 151 mg/dL — ABNORMAL HIGH (ref 65–99)
Glucose-Capillary: 152 mg/dL — ABNORMAL HIGH (ref 65–99)

## 2017-01-08 LAB — HEPARIN LEVEL (UNFRACTIONATED)

## 2017-01-08 LAB — I-STAT CHEM 8, ED
BUN: 33 mg/dL — ABNORMAL HIGH (ref 6–20)
CALCIUM ION: 1.44 mmol/L — AB (ref 1.15–1.40)
CREATININE: 1.9 mg/dL — AB (ref 0.61–1.24)
Chloride: 108 mmol/L (ref 101–111)
GLUCOSE: 133 mg/dL — AB (ref 65–99)
HCT: 38 % — ABNORMAL LOW (ref 39.0–52.0)
HEMOGLOBIN: 12.9 g/dL — AB (ref 13.0–17.0)
POTASSIUM: 4.3 mmol/L (ref 3.5–5.1)
Sodium: 143 mmol/L (ref 135–145)
TCO2: 24 mmol/L (ref 22–32)

## 2017-01-08 LAB — LACTIC ACID, PLASMA
LACTIC ACID, VENOUS: 4.1 mmol/L — AB (ref 0.5–1.9)
Lactic Acid, Venous: 2.6 mmol/L (ref 0.5–1.9)

## 2017-01-08 LAB — ECHOCARDIOGRAM COMPLETE
Height: 68 in
Weight: 3069.16 oz

## 2017-01-08 LAB — BRAIN NATRIURETIC PEPTIDE: B NATRIURETIC PEPTIDE 5: 340.9 pg/mL — AB (ref 0.0–100.0)

## 2017-01-08 LAB — PROCALCITONIN: Procalcitonin: <0.1 ng/mL

## 2017-01-08 LAB — MAGNESIUM: MAGNESIUM: 2.4 mg/dL (ref 1.7–2.4)

## 2017-01-08 MED ORDER — ALBUTEROL SULFATE (2.5 MG/3ML) 0.083% IN NEBU: 2.5000 mg | INHALATION_SOLUTION | RESPIRATORY_TRACT | Status: DC | PRN

## 2017-01-08 MED ORDER — INSULIN ASPART 100 UNIT/ML ~~LOC~~ SOLN: 1.0000 [IU] | SUBCUTANEOUS | Status: DC

## 2017-01-08 MED ORDER — INSULIN ASPART 100 UNIT/ML ~~LOC~~ SOLN
0.0000 [IU] | SUBCUTANEOUS | Status: DC
  Administered 2017-01-08: 3 [IU] via SUBCUTANEOUS
  Administered 2017-01-08: 8 [IU] via SUBCUTANEOUS
  Administered 2017-01-08: 5 [IU] via SUBCUTANEOUS
  Administered 2017-01-08 – 2017-01-09 (×2): 2 [IU] via SUBCUTANEOUS
  Administered 2017-01-09 – 2017-01-10 (×4): 3 [IU] via SUBCUTANEOUS
  Administered 2017-01-10: 8 [IU] via SUBCUTANEOUS

## 2017-01-08 MED ORDER — MIDAZOLAM HCL 2 MG/2ML IJ SOLN: 1.0000 mg | INTRAMUSCULAR | Status: DC | PRN

## 2017-01-08 MED ORDER — AMIODARONE HCL IN DEXTROSE 360-4.14 MG/200ML-% IV SOLN
60.0000 mg/h | Freq: Once | INTRAVENOUS | Status: AC
  Administered 2017-01-08: 60 mg/h via INTRAVENOUS

## 2017-01-08 MED ORDER — SODIUM CHLORIDE 0.9 % IV SOLN: INTRAVENOUS | Status: DC

## 2017-01-08 MED ORDER — FENTANYL 2500MCG IN NS 250ML (10MCG/ML) PREMIX INFUSION
25.0000 ug/h | INTRAVENOUS | Status: DC
  Administered 2017-01-08: 50 ug/h via INTRAVENOUS
  Administered 2017-01-08: 25 ug/h via INTRAVENOUS
  Administered 2017-01-09: 150 ug/h via INTRAVENOUS
  Administered 2017-01-10: 125 ug/h via INTRAVENOUS
  Filled 2017-01-08 (×3): qty 250

## 2017-01-08 MED ORDER — FENTANYL CITRATE (PF) 100 MCG/2ML IJ SOLN: 50.0000 ug | INTRAMUSCULAR | Status: DC | PRN

## 2017-01-08 MED ORDER — HEPARIN (PORCINE) IN NACL 100-0.45 UNIT/ML-% IJ SOLN
800.0000 [IU]/h | INTRAMUSCULAR | Status: DC
  Administered 2017-01-08: 1000 [IU]/h via INTRAVENOUS
  Filled 2017-01-08 (×3): qty 250

## 2017-01-08 MED ORDER — ARFORMOTEROL TARTRATE 15 MCG/2ML IN NEBU
15.0000 ug | INHALATION_SOLUTION | Freq: Two times a day (BID) | RESPIRATORY_TRACT | Status: DC
  Administered 2017-01-08 – 2017-01-10 (×5): 15 ug via RESPIRATORY_TRACT
  Filled 2017-01-08 (×5): qty 2

## 2017-01-08 MED ORDER — MIDAZOLAM HCL 2 MG/2ML IJ SOLN
1.0000 mg | INTRAMUSCULAR | Status: DC | PRN
  Administered 2017-01-09: 1 mg via INTRAVENOUS
  Administered 2017-01-09 – 2017-01-10 (×4): 2 mg via INTRAVENOUS
  Filled 2017-01-08: qty 4
  Filled 2017-01-08 (×4): qty 2

## 2017-01-08 MED ORDER — CHLORHEXIDINE GLUCONATE 0.12% ORAL RINSE (MEDLINE KIT)
15.0000 mL | Freq: Two times a day (BID) | OROMUCOSAL | Status: DC
  Administered 2017-01-08 – 2017-01-10 (×5): 15 mL via OROMUCOSAL

## 2017-01-08 MED ORDER — BISACODYL 10 MG RE SUPP: 10.0000 mg | Freq: Every day | RECTAL | Status: DC | PRN

## 2017-01-08 MED ORDER — LEVOTHYROXINE SODIUM 100 MCG IV SOLR
56.0000 ug | Freq: Every day | INTRAVENOUS | Status: DC
  Administered 2017-01-08: 56 ug via INTRAVENOUS
  Filled 2017-01-08: qty 5

## 2017-01-08 MED ORDER — FENTANYL BOLUS VIA INFUSION
25.0000 ug | INTRAVENOUS | Status: DC | PRN
  Administered 2017-01-08 – 2017-01-09 (×4): 25 ug via INTRAVENOUS
  Filled 2017-01-08: qty 25

## 2017-01-08 MED ORDER — PANTOPRAZOLE SODIUM 40 MG IV SOLR
40.0000 mg | Freq: Every day | INTRAVENOUS | Status: DC
  Administered 2017-01-08: 40 mg via INTRAVENOUS
  Filled 2017-01-08: qty 40

## 2017-01-08 MED ORDER — SODIUM CHLORIDE 0.9 % IV BOLUS (SEPSIS)
500.0000 mL | Freq: Once | INTRAVENOUS | Status: AC
  Administered 2017-01-08: 500 mL via INTRAVENOUS

## 2017-01-08 MED ORDER — MAGNESIUM SULFATE 2 GM/50ML IV SOLN
2.0000 g | Freq: Once | INTRAVENOUS | Status: AC
  Administered 2017-01-08: 2 g via INTRAVENOUS

## 2017-01-08 MED ORDER — ORAL CARE MOUTH RINSE
15.0000 mL | OROMUCOSAL | Status: DC
  Administered 2017-01-08 – 2017-01-10 (×20): 15 mL via OROMUCOSAL

## 2017-01-08 MED ORDER — ASPIRIN 300 MG RE SUPP
300.0000 mg | RECTAL | Status: AC
  Administered 2017-01-08: 300 mg via RECTAL
  Filled 2017-01-08: qty 1

## 2017-01-08 MED ORDER — BUDESONIDE 0.5 MG/2ML IN SUSP
0.5000 mg | Freq: Two times a day (BID) | RESPIRATORY_TRACT | Status: DC
  Administered 2017-01-08 – 2017-01-10 (×5): 0.5 mg via RESPIRATORY_TRACT
  Filled 2017-01-08 (×5): qty 2

## 2017-01-08 MED ORDER — SODIUM BICARBONATE 8.4 % IV SOLN
50.0000 meq | Freq: Once | INTRAVENOUS | Status: AC
  Administered 2017-01-08: 50 meq via INTRAVENOUS

## 2017-01-08 MED ORDER — AMIODARONE HCL IN DEXTROSE 360-4.14 MG/200ML-% IV SOLN
30.0000 mg/h | INTRAVENOUS | Status: DC
  Administered 2017-01-08: 60 mg/h via INTRAVENOUS
  Administered 2017-01-08: 30 mg/h via INTRAVENOUS
  Administered 2017-01-08: 60 mg/h via INTRAVENOUS
  Administered 2017-01-09: 30 mg/h via INTRAVENOUS
  Filled 2017-01-08 (×3): qty 200

## 2017-01-08 MED ORDER — SODIUM CHLORIDE 0.9 % IV BOLUS (SEPSIS)
1000.0000 mL | Freq: Once | INTRAVENOUS | Status: AC
  Administered 2017-01-08: 1000 mL via INTRAVENOUS

## 2017-01-08 MED ORDER — MIDAZOLAM HCL 2 MG/2ML IJ SOLN
1.0000 mg | INTRAMUSCULAR | Status: DC | PRN
  Administered 2017-01-08: 1 mg via INTRAVENOUS
  Filled 2017-01-08: qty 2

## 2017-01-08 MED ORDER — DOCUSATE SODIUM 50 MG/5ML PO LIQD: 100.0000 mg | Freq: Two times a day (BID) | ORAL | Status: DC | PRN

## 2017-01-08 MED ORDER — SODIUM CHLORIDE 0.9 % IV SOLN
INTRAVENOUS | Status: DC
  Administered 2017-01-08 (×2): via INTRAVENOUS

## 2017-01-08 MED ORDER — ACETAMINOPHEN 160 MG/5ML PO SOLN
650.0000 mg | Freq: Four times a day (QID) | ORAL | Status: DC | PRN
Start: 2017-01-08 — End: 2017-01-10
  Administered 2017-01-08 – 2017-01-09 (×3): 650 mg
  Filled 2017-01-08 (×3): qty 20.3

## 2017-01-08 MED ORDER — AMIODARONE HCL IN DEXTROSE 360-4.14 MG/200ML-% IV SOLN
INTRAVENOUS | Status: AC
  Filled 2017-01-08: qty 200

## 2017-01-08 MED FILL — Medication: Qty: 1 | Status: AC

## 2017-01-08 NOTE — ED Notes (Signed)
Stool incontinent care provided , complete linen/gown change , repositioned for comfort .

## 2017-01-08 NOTE — ED Notes (Signed)
Notified EDP of critical Lactic Acid.

## 2017-01-08 NOTE — Procedures (Addendum)
ELECTROENCEPHALOGRAM REPORT  Date of Study: 12/23/2016  Patient's Name: Barry STRIPLING Sr. MRN: [id number] Date of Birth: 03/03/1993  Referring Provider: Dr. Chesley Mires  Clinical History: This is an 83 year old man s/p cardiac arrest, on hypothermia protocol  Medications: fentaNYL 255mg in NS 2527m(1082mml) infusion-PREMIX  albuterol (PROVENTIL) (2.5 MG/3ML) 0.083% nebulizer solution 2.5 mg  amiodarone (NEXTERONE PREMIX) 360-4.14 MG/200ML-% (1.8 mg/mL) IV infusion  arformoterol (BROVANA) nebulizer solution 15 mcg  bisacodyl (DULCOLAX) suppository 10 mg  budesonide (PULMICORT) nebulizer solution 0.5 mg  chlorhexidine gluconate (MEDLINE KIT) (PERIDEX) 0.12 % solution 15 mL  docusate (COLACE) 50 MG/5ML liquid 100 mg  heparin ADULT infusion 100 units/mL (25000 units/250m53mdium chloride 0.45%)  insulin aspart (novoLOG) injection 1-3 Units  levothyroxine (SYNTHROID, LEVOTHROID) injection 56 mcg  pantoprazole (PROTONIX) injection 40 mg   Technical Summary: A multichannel digital EEG recording measured by the international 10-20 system with electrodes applied with paste and impedances below 5000 ohms performed in our laboratory with EKG monitoring in an intubated and sedated patient on hypothermia protocol at 36 degrees Celsius.  Hyperventilation and photic stimulation were not performed.  The digital EEG was referentially recorded, reformatted, and digitally filtered in a variety of bipolar and referential montages for optimal display.    Description: The patient is intubated and sedated on Fentanyl during the recording. There is loss of normal background activity. The records read at a sensitivity of 3 uV/mm shows diffuse suppression of background activity. There is no spontaneous reactivity or reactivity noted with noxious stimulation. There is muscle artifact over the frontal leads. Hyperventilation and photic stimulation were not performed. There were no epileptiform discharges or  electrographic seizures seen.   EKG lead was unremarkable.  Impression: This EEG is markedly abnormal due to diffuse background suppression and lack of EEG reactivity with noxious stimulation.  Clinical Correlation of the above findings indicates severe diffuse cerebral dysfunction that is non-specific in etiology and can be seen in the setting of anoxic/ischemic injury, toxic/metabolic encephalopathies, or medication effect. Clinical correlation is advised.  KareEllouise NewerD.

## 2017-01-08 NOTE — Progress Notes (Signed)
Dr. Burt Knack paged in regards to amioderone continuing at 60 without any orders to decrease. Awaiting  Further rders

## 2017-01-08 NOTE — Progress Notes (Signed)
Wife in, updated on condition, gave her denturews and ring to take home.  Paged NP and Dr Halford Chessman regarding Urine output < 10 cc /hr. Awaiting further orders

## 2017-01-08 NOTE — Progress Notes (Signed)
CRITICAL VALUE ALERT  Critical Value:  Troponin 19.67 lactic acid 4.1  Date & Time Notied: 12/20/2016 1015  Provider Notified:12/19/2016 1030 Orders Received/Actions taken:

## 2017-01-08 NOTE — ED Triage Notes (Signed)
Patient arrived with EMS from home found unresponsive /cardiac arrest this morning received 8 doses of Epinephrine , Shocked x6 , Amiodarone 450 mg , Calcium chloride 2 grams and 1 Bicarb ampule prior to arrival , ROSC x8 . Pulses present at arrival / intubated at scene.

## 2017-01-08 NOTE — Progress Notes (Signed)
Patient connected to arctic sun since arrival, ice bags on, but temp not decreasing via foley temp. . Esophogeal probe placed, with a 32.5 c temperature, both oral and rectal temperatures obtained. Oral 98.30F, rectal 97.5 F both corollate with foley temperature. Placed back on foley temperature.  Slowly decreasing, see flowsheet. More ice bags applied.

## 2017-01-08 NOTE — ED Notes (Signed)
Dr. Wyvonnia Dusky notified on pt.'s Troponin results .

## 2017-01-08 NOTE — Progress Notes (Signed)
RT transported pt on vent from ED to 2H04. Pt's vitals remained stable throughout.

## 2017-01-08 NOTE — ED Notes (Signed)
Dr. Burt Knack ( cardiologist) at bedside .

## 2017-01-08 NOTE — ED Provider Notes (Signed)
[facility] EMERGENCY DEPARTMENT Provider Note   CSN: [id number] Arrival date & time: 01/06/2017  0315     History   Chief Complaint Chief Complaint  Patient presents with  . Cardiac Arrest    HPI Barry ODONOHUE Sr. is a 83 y.o. male.  Level 5 caveat for acuity condition.  Patient presents after cardiac arrest.  Per EMS, patient woke from sleep clutching his chest and gasping for air.  CPR was started by wife aand first responders.  EMS found patient in PEA.  He received 8 doses of epinephrine and has had multiple rounds of return of circulation.  He got 6 shocks for ventricular fibrillation and was loaded with amiodarone.  He was also given calcium and bicarb.  ROSC happened x8.  EMS estimates CPR ongoing for 60 minutes.  Patient is breathing on his own but no other spontaneous movement or neurological activity.  History of CABG last year.   The history is provided by the EMS personnel and a relative. The history is limited by the condition of the patient.  Cardiac Arrest    Past Medical History:  Diagnosis Date  . Anemia    hx of year ago no problems now per pt   . Arthritis    generalized   . Asthma   . Atypical chest pain   . Bladder neck contracture   . Chronic back pain   . Complication of anesthesia    POST OP HYPOTENSION  . Dementia    per wife pt had neurological evaluation  . Diverticulosis of colon   . First degree heart block   . GERD (gastroesophageal reflux disease)   . Heart murmur   . History of basal cell carcinoma excision    forehead and x6 area forearms  . History of Bell's palsy    x2  1998 &  2004 left side --  residual slight left eye droop and vace  . History of chronic bronchitis   . History of colon polyps   . History of gout   . History of hypertension   . History of MI (myocardial infarction)    08/ 2010  secondary acute renal failure/ dehydration--  medical management  . History of pulmonary embolus (PE)    2010  .  Hypogonadism male   . Hypotension   . Hypothyroidism   . OSA (obstructive sleep apnea)    severe per study 2007/  non-compliant cpap  . Peripheral vascular disease ([place])   . Prostate carcinoma [facility]) urologist-  dr Gaynelle Arabian   DX 2007  Gleason 6  and recurrent 2012  s/p  cryoablation of prostate both times  . RBBB (right bundle branch block)   . Recovering alcoholic in remission ([place])   . Renal calculus, bilateral   . Type 2 diabetes mellitus [facility])     Patient Active Problem List   Diagnosis Date Noted  . MSSA bacteremia   . Bacteremia   . Fever 07/14/2016  . Dementia 07/14/2016  . Persistent atrial fibrillation ([place])   . Atrial flutter ([place]) 12/04/2015  . S/P CABG (coronary artery bypass graft) 11/29/2015  . NSTEMI (non-ST elevated myocardial infarction) ([place]) 11/25/2015  . CKD (chronic kidney disease), stage III ([place]) 11/25/2015  . Memory loss 01/04/2015  . Cough variant asthma 10/12/2013  . Iron deficiency anemia 07/23/2013  . Irregular heart beat 12/11/2012  . Type II diabetes mellitus, uncontrolled ([place]) 10/12/2012  . Mitral regurgitation and aortic stenosis 10/12/2012  . Pure  hypercholesterolemia 10/12/2012  . Hypothyroidism 10/12/2012  . Osteoarthritis 10/12/2012  . Prostate cancer ([place]) 10/12/2012    Past Surgical History:  Procedure Laterality Date  . CARDIAC CATHETERIZATION N/A 11/27/2015   Procedure: Left Heart Cath and Coronary Angiography;  Surgeon: Peter M [name], MD;  Location: [facility] CV LAB;  Service: Cardiovascular;  Laterality: N/A;  . CARDIOVASCULAR STRESS TEST  05-19-2012  dr Tressia Miners turner   normal perfusionstudy/  no ischemia/  attenuation artifact inferoapex region of myocardium/  note gate secondary to rhythm irregularity  . CARDIOVERSION N/A 01/05/2016   Procedure: CARDIOVERSION;  Surgeon: Sanda Klein, MD;  Location: [facility];  Service: Cardiovascular;  Laterality: N/A;  . CATARACT EXTRACTION W/ INTRAOCULAR LENS IMPLANT Right 2014  .  COLONOSCOPY W/ POLYPECTOMY  last one 08-10-2013  . CORONARY ARTERY BYPASS GRAFT N/A 11/29/2015   Procedure: CORONARY ARTERY BYPASS GRAFTING (CABG), ON PUMP, TIMES FIVE, USING LEFT INTERNAL MAMMARY ARTERY, RIGHT GREATER SAPHENOUS VEIN HARVESTED ENDOSCOPICALLY;  Surgeon: [name], MD;  Location: [facility];  Service: Open Heart Surgery;  Laterality: N/A;  LIMA-LAD; SEQ SVG-OM1-OM2;SVG-DIAG; SVG-PL  . CYSTOSCOPY WITH URETHRAL DILATATION N/A 01/14/2014   Procedure: URETHRAL MEATAL DILATATION;  Surgeon: Ailene Rud, MD;  Location[facility];  Service: Urology;  Laterality: N/A;  . EXCISION BENIGN SKIN LESION OF BACK  10/ 2011  . HIATAL HERNIA REPAIR  1983  . INGUINAL HERNIA REPAIR Right 10-14-2001  . INTRAOPERATIVE TRANSESOPHAGEAL ECHOCARDIOGRAM N/A 11/29/2015   Procedure: INTRAOPERATIVE TRANSESOPHAGEAL ECHOCARDIOGRAM;  Surgeon: [name], MD;  Location: [place];  Service: Open Heart Surgery;  Laterality: N/A;  . KNEE ARTHROSCOPY W/ MENISCECTOMY Right 01-25-2003   and chondroplasty  . [place]  . PROSTATE CRYOABLATION  09-17-2010  &  04-12-2005  . REPAIR RIGHT ARM TENDON INJURY  1975  . SHOULDER OPEN ROTATOR CUFF REPAIR  03/20/2011   Procedure: ROTATOR CUFF REPAIR SHOULDER OPEN;  Surgeon: Tobi Bastos;  Location: [facility];  Service: Orthopedics;  Laterality: Right;  . TEE WITHOUT CARDIOVERSION N/A 01/05/2016   Procedure: TRANSESOPHAGEAL ECHOCARDIOGRAM (TEE);  Surgeon: Sanda Klein, MD;  Location: [place];  Service: Cardiovascular;  Laterality: N/A;  . TEE WITHOUT CARDIOVERSION N/A 07/30/2016   Procedure: TRANSESOPHAGEAL ECHOCARDIOGRAM (TEE);  Surgeon: Skeet Latch, MD;  Location: [facility];  Service: Cardiovascular;  Laterality: N/A;  . TONSILLECTOMY  as child  . TRANSTHORACIC ECHOCARDIOGRAM  01-11-2013  dr [name]   moderate LVH/  grade I diastolic dysfunction/ ef 26-37%/  mild AV stenosis /  mild MR/  mild LAE  . TRANSURETHRAL  RESECTION OF BLADDER TUMOR N/A 01/14/2014   Procedure: TRANSURETHRAL INCISION OF BLADDER NECK CONTRACTURE;  Surgeon: Ailene Rud, MD;  Location[facility];  Service: Urology;  Laterality: N/A;  . Vantus Implant     Hormones for Prostate  . [place] Medications    Prior to Admission medications   Medication Sig Start Date End Date Taking? Authorizing Provider  acetaminophen (TYLENOL) 500 MG tablet Take 500 mg by mouth every 8 (eight) hours as needed for mild pain, moderate pain or headache.     [provider]  albuterol (PROAIR HFA) 108 (90 Base) MCG/ACT inhaler Inhale 2 puffs into the lungs every 4 (four) hours as needed for wheezing or shortness of breath.    [provider]  aspirin EC 81 MG tablet Take 81 mg by mouth daily.  [provider]  budesonide-formoterol (SYMBICORT) 160-4.5 MCG/ACT inhaler Inhale 2 puffs into the lungs 2 (two) times daily. 12/31/16   Parrett, Fonnie Mu, NP  dextromethorphan-guaiFENesin (ROBITUSSIN-DM) 10-100 MG/5ML liquid Take 10 mLs by mouth every 4 (four) hours as needed for cough.    [provider]  ELIQUIS 5 MG TABS tablet TAKE ONE TABLET BY MOUTH TWICE DAILY 10/07/16   Josue Hector, MD  famotidine (PEPCID) 20 MG tablet One at bedtime 12/17/16   Tanda Rockers, MD  ferrous sulfate 325 (65 FE) MG EC tablet Take 325 mg by mouth daily with supper.     [provider]  finasteride (PROSCAR) 5 MG tablet Take 5 mg by mouth daily.     [provider]  gabapentin (NEURONTIN) 300 MG capsule Take 300 mg by mouth 2 (two) times daily.    [provider]  Hydrocodone-Acetaminophen 5-300 MG TABS Take 1 tablet by mouth every 4 (four) hours as needed.    [provider]  insulin lispro (HUMALOG KWIKPEN) 100 UNIT/ML KiwkPen If blood sugar is 250+, take 10 units, if blood sugar is over 350, take 15 units. Dx code E11.9 07/23/16   [name], MD    insulin lispro protamine-lispro (HUMALOG 75/25 MIX) (75-25) 100 UNIT/ML SUSP injection Inject 36 Units into the skin daily.    [provider]  levothyroxine (SYNTHROID, LEVOTHROID) 112 MCG tablet TAKE 1 TABLET BY MOUTH ONCE DAILY 12/10/16   [name], MD  nitroGLYCERIN (NITROSTAT) 0.4 MG SL tablet DISSOLVE ONE TABLET UNDER THE TONGUE EVERY 5 MINUTES AS NEEDED FOR CHEST PAIN.  DO NOT EXCEED A TOTAL OF 3 DOSES IN 15 MINUTES 11/25/16   Eileen Stanford, PA-C  pantoprazole (PROTONIX) 40 MG tablet Take 1 tablet (40 mg total) by mouth daily. Take 30-60 min before first meal of the day 12/17/16   Tanda Rockers, MD  Polyethylene Glycol 3350 (MIRALAX PO) Take 17 g by mouth as needed.    [provider]  rosuvastatin (CRESTOR) 10 MG tablet Take 1 tablet (10 mg total) by mouth daily. 09/02/16   [name], MD  TOUJEO SOLOSTAR 300 UNIT/ML SOPN Inject 42 Units into the skin daily. 01/01/17   [name], MD  triamcinolone (NASACORT ALLERGY 24HR) 55 MCG/ACT AERO nasal inhaler Place 2 sprays into the nose 2 (two) times daily.    [provider]    Family History Family History  Problem Relation Age of Onset  . Heart disease Mother   . Stroke Father   . Cancer Brother        unsure of type  . Cancer Sister   . Diabetes Sister   . Diabetes Sister   . Stroke Brother   . Cancer Brother   . Cancer Brother   . Cancer Brother   . Cancer Brother   . Cancer Brother     Social History Social History  Substance Use Topics  . Smoking status: Never Smoker  . Smokeless tobacco: Never Used  . Alcohol use No     Comment: RECOVERING ALCOHOLIC none since 6606      Allergies   Penicillins and Prednisone   Review of Systems Review of Systems  Unable to perform ROS: Acuity of condition     Physical Exam Updated Vital Signs BP (!) 177/87   Pulse 68   Temp (!) 96.1 F (35.6 C)   Resp (!) 28   SpO2 100%   Physical Exam  Constitutional: He appears distressed.  Unresponsive, shallow breaths are ventilator  HENT:  Head: Atraumatic.  Mouth/Throat: No oropharyngeal exudate.  Eyes:  Pinpoint pupils  Cardiovascular: Normal rate, regular rhythm and normal heart sounds.   No murmur heard. Wide complex bigeminy  Pulmonary/Chest: No respiratory distress. He exhibits no tenderness.  Assisted ventilations. Equal breath sounds with bagging  Abdominal: He exhibits distension.  Musculoskeletal: He exhibits no edema or deformity.  Neurological:  Unresponsive, no spontaneous movement  Skin: Skin is warm. Capillary refill takes less than 2 seconds. No rash noted. No erythema.     ED Treatments / Results  Labs (all labs ordered are listed, but only abnormal results are displayed) Labs Reviewed  CBC WITH DIFFERENTIAL/PLATELET - Abnormal; Notable for the following:       Result Value   WBC 17.5 (*)    Hemoglobin 11.8 (*)    HCT 38.6 (*)    Neutro Abs 12.6 (*)    Lymphs Abs 4.1 (*)    All other components within normal limits  PROTIME-INR - Abnormal; Notable for the following:    Prothrombin Time 16.5 (*)    All other components within normal limits  URINALYSIS, ROUTINE W REFLEX MICROSCOPIC - Abnormal; Notable for the following:    APPearance CLOUDY (*)    Glucose, UA 150 (*)    Hgb urine dipstick SMALL (*)    Protein, ur 100 (*)    Bacteria, UA RARE (*)    Squamous Epithelial / LPF 0-5 (*)    All other components within normal limits  I-STAT CG4 LACTIC ACID, ED - Abnormal; Notable for the following:    Lactic Acid, Venous 4.92 (*)    All other components within normal limits  I-STAT CHEM 8, ED - Abnormal; Notable for the following:    BUN 33 (*)    Creatinine, Ser 1.90 (*)    Glucose, Bld 133 (*)    Calcium, Ion 1.44 (*)    Hemoglobin 12.9 (*)    HCT 38.0 (*)    All other components within normal limits  I-STAT TROPONIN, ED - Abnormal; Notable for the following:    Troponin i, poc 0.39 (*)    All other components within normal limits   COMPREHENSIVE METABOLIC PANEL  TROPONIN I  BRAIN NATRIURETIC PEPTIDE  I-STAT ARTERIAL BLOOD GAS, ED    EKG  EKG Interpretation  Date/Time:  Wednesday January 08 2017 03:20:04 EDT Ventricular Rate:  84 PR Interval:    QRS Duration: 159 QT Interval:  429 QTC Calculation: 392 R Axis:   -90 Text Interpretation:  Sinus rhythm Ventricular tachycardia, unsustained Prolonged PR interval Consider left atrial enlargement RBBB and LAFB Probable inferior infarct, acute Wide QRS rhythm Left bundle branch block nonsustained VT Confirmed by Ezequiel Essex [phone]) on 01/07/2017 4:10:57 AM       Radiology Ct Chest High Resolution  Result Date: 01/06/2017 CLINICAL DATA:  Persistent cough, recurrent bronchitis. EXAM: CT CHEST WITHOUT CONTRAST TECHNIQUE: Multidetector CT imaging of the chest was performed following the standard protocol without intravenous contrast. High resolution imaging of the lungs, as well as inspiratory and expiratory imaging, was performed. COMPARISON:  None. FINDINGS: Cardiovascular: Atherosclerotic calcification of the arterial vasculature. Heart is mildly enlarged. No pericardial effusion. Mediastinum/Nodes: Mediastinal lymph nodes are not enlarged by CT size criteria. Hilar regions are difficult to definitively evaluate without IV contrast. No axillary adenopathy. Esophagus is grossly unremarkable. Lungs/Pleura: Negative for subpleural reticulation, traction bronchiectasis/ bronchiolectasis, ground-glass, architectural distortion or honeycombing. Scattered peribronchovascular nodularity, most likely postinfectious in etiology.  Marked volume loss in the right middle lobe. No centrally obstructing lesion. No pleural fluid. Airway is unremarkable. Upper Abdomen: Visualized portion of the liver is unremarkable. A small stone is seen in the gallbladder. Visualized portions of the adrenal glands, kidneys, spleen and pancreas are grossly unremarkable. Moderate hiatal hernia. Stomach and  bowel are otherwise grossly unremarkable. No upper abdominal adenopathy. Musculoskeletal: Degenerative changes in the spine. A 9 mm soft tissue nodule is seen in the subcutaneous anterior left chest wall, 3 cm lateral to the left nipple. IMPRESSION: 1. No evidence of fibrotic interstitial lung disease. 2. Scattered pulmonary parenchymal scarring, likely postinfectious in etiology. Complete collapse of the right middle lobe without centrally obstructing lesion. Finding appears chronic. 3.  Aortic atherosclerosis (ICD10-170.0). 4. Cholelithiasis. 5. 9 mm subcutaneous nodule in the left anterior chest wall, likely amenable to direct palpation. Electronically Signed   By: Lorin Picket M.D.   On: 01/06/2017 16:11   Dg Chest Portable 1 View  Result Date: 12/23/2016 CLINICAL DATA:  83 year old male status post CPR and intubation. EXAM: PORTABLE CHEST 1 VIEW COMPARISON:  Chest CT dated 01/06/2017 FINDINGS: Endotracheal tube the tip approximately 3 cm above the carina. Enteric tube with tip and side-port above the diaphragm were likely in the hiatal hernia. Recommend advancing of the tube into the stomach. On Mild diffuse chronic interstitial coarsening. There is no focal consolidation, pleural effusion, or pneumothorax. There is mild enlargement of the cardiac silhouette. There is atherosclerotic calcification of the aortic arch. The aorta is slightly tortuous. Median sternotomy wires and CABG vascular clips noted. Fractures of the left lateral fourth and fifth ribs IMPRESSION: 1. Endotracheal tube above the carina. 2. Enteric tube with tip and side-port likely in the hiatal hernia. Recommend further advancing the tube into the stomach. 3. No acute cardiopulmonary process. 4. Fractures of the left lateral fourth and fifth ribs. Electronically Signed   By: Anner Crete M.D.   On: 01/02/2017 03:52    Procedures Procedures (including critical care time)  Medications Ordered in ED Medications  magnesium  sulfate IVPB 2 g 50 mL (2 g Intravenous New Bag/Given 12/18/2016 0327)  sodium bicarbonate injection 50 mEq (50 mEq Intravenous Given 01/07/2017 0327)  amiodarone (NEXTERONE PREMIX) 360-4.14 MG/200ML-% (1.8 mg/mL) IV infusion (60 mg/hr Intravenous New Bag/Given 12/27/2016 0332)     Initial Impression / Assessment and Plan / ED Course  I have reviewed the triage vital signs and the nursing notes.  Pertinent labs & imaging results that were available during my care of the patient were reviewed by me and considered in my medical decision making (see chart for details).    Patient arrives from home after witnessed cardiac arrest.  Wife started CPR.  Prolonged CPR by EMS and fire department with multiple shocks for ventricular fibrillation.  Patient arrives unresponsive, intubated by fire department.  Airway confirmed on arrival by direct visualization.  EKG on shows EKG reveals a wide QRS rhythm with no clear sinus beats and ventricular bigeminy is present.  There is no ST segment elevation. Bicarb and calcium given.  Patient does have diffuse ST depressions with elevation in aVR.  Discussed with Dr. Burt Knack of cardiology on arrival.  Patient had a runs of ventricular tachycardia that are nonsustained.  He is loaded with amiodarone and continue amiodarone infusion.  Cooling protocol initiated.  Patient is maintaining his blood pressure off of pressors but is neurologically unresponsive with pinpoint pupils and no neurological activity though he is breathing over the ventilator.  Dr. Burt Knack at bedside has reviewed patient's EKGs.  He does not feel he is a Cath Lab candidate given his age and prolonged downtime.  Continue amiodarone infusion and start heparin if CT head is negative.  Per Dr. Burt Knack "  I do not think the patient is a candidate for emergency cardiac catheterization considering his advanced age, poor functional capacity, prolonged CPR in the setting of out of hospital cardiac arrest."  The  patient's family has arrived and understands the gravity of the situation.  Patient's wife states he would not to be on prolonged life support.  She is agreeable to no further escalation of care and DNR but maintaining the current care at this time.  Admission discussed with cardiology and critical care.  Patient will be admitted to ICU.  He is DNR at this time.  Continue amiodarone infusion.  Patient has not needed any sedation throughout his ED course.  CT head does not show any obvious anoxic injury.  CRITICAL CARE Performed by: Ezequiel Essex Total critical care time: 60 minutes Critical care time was exclusive of separately billable procedures and treating other patients. Critical care was necessary to treat or prevent imminent or life-threatening deterioration. Critical care was time spent personally by me on the following activities: development of treatment plan with patient and/or surrogate as well as nursing, discussions with consultants, evaluation of patient's response to treatment, examination of patient, obtaining history from patient or surrogate, ordering and performing treatments and interventions, ordering and review of laboratory studies, ordering and review of radiographic studies, pulse oximetry and re-evaluation of patient's condition.   Final Clinical Impressions(s) / ED Diagnoses   Final diagnoses:  Cardiac arrest [facility])    New Prescriptions New Prescriptions   No medications on file     Ezequiel Essex, MD 12/18/2016 [phone]
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
## 2017-01-08 NOTE — Progress Notes (Signed)
Cardiology Note: The patient just seen early this morning along with the cardiology fellow.  The patient's wife is still at the bedside.  His clinical status is unchanged.  His heart rhythm is stable.  Telemetry is reviewed and there are no sustained arrhythmias.  Would continue IV heparin and amiodarone.  He is not requiring vasopressor agents.  The patient remains comatose. Vitals: Vitals:   12/19/2016 0800 12/30/2016 0836  BP: (!) 166/103 (!) 142/121  Pulse: 80 83  Resp: (!) 21 (!) 27  Temp: 97.9 F (36.6 C)   SpO2: 100% 100%   Again, discussed expectations with the patient's wife.  She has realistic expectations and understands that his advanced age and prolonged CPR carry a relatively poor prognosis.  Plan to continue current level of support and assess neurologic recovery after he rewarms.  No further recommendations at this time.  Will follow.  Sherren Mocha 12/17/2016 9:09 AM

## 2017-01-08 NOTE — H&P (Signed)
PULMONARY / CRITICAL CARE MEDICINE   Name: Barry SCARANO Sr. MRN: [id number] DOB: [birth year]    ADMISSION DATE:  01/01/2017 CONSULTATION DATE:  12/23/2016  REFERRING MD:  Dr. Wyvonnia Dusky  CHIEF COMPLAINT:  Cardiac Arrest  HISTORY OF PRESENT ILLNESS:  HPI obtained from medical chart review and from patients wife at bedside as patient is intubated with acute encephalopathy.   83 year old male with extensive PMH significant for but not limited to HTN, HLD, DM, HTN, OSA- noncompliant w/CPAP, asthma, PVD, CAD s/p CABG x5 in Sep 2017 w/post-op Aflutter who presents to ER s/p cardiac arrest from home.  Per wife, patient had briefly complained of right rib/ chest pain last night but was sleeping fine when he suddenly gasped and started gurgling.  He then went unresponsive and she started CPR in the bed.  She stated that after awhile she realized she wasn't getting anywhere and called EMS (possibly made ~0108 per ER).  Found in PEA, received 8-9 doses of epinephrine, 6 shocks for ventricular fibrillation, given total 450 mg of amiodarone, also given calcium and bicarb.  Over the next 60 minutes, patient had multiple brief episodes of ROSC and CPR.    On arrival to ER, patient intubated and unresponsive with no purposeful movement.  No sedation/ paralytics given in ER.  Cardiology consulted at bedside and deemed poor candidate for emergency cardiac cath.  EKG with wide QRS, with no acute ST elevation. PCCM to admit to ICU.  PAST MEDICAL HISTORY :  He  has a past medical history of Anemia; Arthritis; Asthma; Atypical chest pain; Bladder neck contracture; Chronic back pain; Complication of anesthesia; Dementia; Diverticulosis of colon; First degree heart block; GERD (gastroesophageal reflux disease); Heart murmur; History of basal cell carcinoma excision; History of Bell's palsy; History of chronic bronchitis; History of colon polyps; History of gout; History of hypertension; History of MI (myocardial  infarction); History of pulmonary embolus (PE); Hypogonadism male; Hypotension; Hypothyroidism; OSA (obstructive sleep apnea); Peripheral vascular disease ([place]); Prostate carcinoma [facility]) (urologist-  dr Gaynelle Arabian); RBBB (right bundle branch block); Recovering alcoholic in remission ([place]); Renal calculus, bilateral; and Type 2 diabetes mellitus ([place]).  PAST SURGICAL HISTORY: He  has a past surgical history that includes Shoulder open rotator cuff repair (03/20/2011); Hiatal hernia repair (1983); Knee arthroscopy w/ meniscectomy (Right, 01-25-2003); Inguinal hernia repair (Right, 10-14-2001); Prostate cryoablation (09-17-2010  &  04-12-2005); REPAIR RIGHT ARM TENDON INJURY (1975); Lumbar fusion ([place]); EXCISION BENIGN SKIN LESION OF BACK (10/ 2011); Cardiovascular stress test (05-19-2012  dr Tressia Miners turner); transthoracic echocardiogram (01-11-2013  dr [name]); Ventral hernia repair (1998); Colonoscopy w/ polypectomy (last one 08-10-2013); Cataract extraction w/ intraocular lens implant (Right, 2014); Tonsillectomy (as child); Cystoscopy with urethral dilatation (N/A, 01/14/2014); Transurethral resection of bladder tumor (N/A, 01/14/2014); Vantus Implant; Cardiac catheterization (N/A, 11/27/2015); Coronary artery bypass graft (N/A, 11/29/2015); Intraoprative transesophageal echocardiogram (N/A, 11/29/2015); Cardioversion (N/A, 01/05/2016); TEE without cardioversion (N/A, 01/05/2016); and TEE without cardioversion (N/A, 07/30/2016).  Allergies  Allergen Reactions  . Penicillins Swelling    Eyes and lips Has patient had a PCN reaction causing immediate rash, facial/tongue/throat swelling, SOB or lightheadedness with hypotension: Yes Has patient had a PCN reaction causing severe rash involving mucus membranes or skin necrosis: No Has patient had a PCN reaction that required hospitalization No Has patient had a PCN reaction occurring within the last 10 years: No If all of the above answers are "NO", then may  proceed with Cephalosporin use   . Prednisone  Other (See Comments)    Hyperglycemia and AMS    No current facility-administered medications on file prior to encounter.    Current Outpatient Prescriptions on File Prior to Encounter  Medication Sig  . acetaminophen (TYLENOL) 500 MG tablet Take 500 mg by mouth every 8 (eight) hours as needed for mild pain, moderate pain or headache.   . albuterol (PROAIR HFA) 108 (90 Base) MCG/ACT inhaler Inhale 2 puffs into the lungs every 4 (four) hours as needed for wheezing or shortness of breath.  Marland Kitchen aspirin EC 81 MG tablet Take 81 mg by mouth daily.   . budesonide-formoterol (SYMBICORT) 160-4.5 MCG/ACT inhaler Inhale 2 puffs into the lungs 2 (two) times daily.  Marland Kitchen dextromethorphan-guaiFENesin (ROBITUSSIN-DM) 10-100 MG/5ML liquid Take 10 mLs by mouth every 4 (four) hours as needed for cough.  Marland Kitchen ELIQUIS 5 MG TABS tablet TAKE ONE TABLET BY MOUTH TWICE DAILY  . famotidine (PEPCID) 20 MG tablet One at bedtime  . ferrous sulfate 325 (65 FE) MG EC tablet Take 325 mg by mouth daily with supper.   . finasteride (PROSCAR) 5 MG tablet Take 5 mg by mouth daily.   Marland Kitchen gabapentin (NEURONTIN) 300 MG capsule Take 300 mg by mouth 2 (two) times daily.  . Hydrocodone-Acetaminophen 5-300 MG TABS Take 1 tablet by mouth every 4 (four) hours as needed.  . insulin lispro (HUMALOG KWIKPEN) 100 UNIT/ML KiwkPen If blood sugar is 250+, take 10 units, if blood sugar is over 350, take 15 units. Dx code E11.9  . insulin lispro protamine-lispro (HUMALOG 75/25 MIX) (75-25) 100 UNIT/ML SUSP injection Inject 36 Units into the skin daily.  Marland Kitchen levothyroxine (SYNTHROID, LEVOTHROID) 112 MCG tablet TAKE 1 TABLET BY MOUTH ONCE DAILY  . nitroGLYCERIN (NITROSTAT) 0.4 MG SL tablet DISSOLVE ONE TABLET UNDER THE TONGUE EVERY 5 MINUTES AS NEEDED FOR CHEST PAIN.  DO NOT EXCEED A TOTAL OF 3 DOSES IN 15 MINUTES  . pantoprazole (PROTONIX) 40 MG tablet Take 1 tablet (40 mg total) by mouth daily. Take 30-60 min  before first meal of the day  . Polyethylene Glycol 3350 (MIRALAX PO) Take 17 g by mouth as needed.  . rosuvastatin (CRESTOR) 10 MG tablet Take 1 tablet (10 mg total) by mouth daily.  Nelva Nay SOLOSTAR 300 UNIT/ML SOPN Inject 42 Units into the skin daily.  Marland Kitchen triamcinolone (NASACORT ALLERGY 24HR) 55 MCG/ACT AERO nasal inhaler Place 2 sprays into the nose 2 (two) times daily.    FAMILY HISTORY:  His indicated that his mother is deceased. He indicated that his father is deceased. He indicated that only one of his five sisters is alive. He indicated that all of his seven brothers are deceased. He indicated that his maternal grandmother is deceased. He indicated that his maternal grandfather is deceased. He indicated that his paternal grandmother is deceased. He indicated that his paternal grandfather is deceased.    SOCIAL HISTORY: He  reports that he has never smoked. He has never used smokeless tobacco. He reports that he does not drink alcohol or use drugs.  REVIEW OF SYSTEMS:   Unable secondary to acute encephalopathy.  SUBJECTIVE:   VITAL SIGNS: BP (!) 175/84   Pulse (!) 34   Temp (!) 96.8 F (36 C)   Resp (!) 28   SpO2 100%   HEMODYNAMICS:    VENTILATOR SETTINGS:    INTAKE / OUTPUT: No intake/output data recorded.  PHYSICAL EXAMINATION: General:  Well nourished elderly male unresponsive on MV HEENT: MM pink/moist, ETT, OGT, pinpoint  pupils, no corneal reflex Neuro:  Unresponsive, no response to noxious stimuli CV: IR RR, +1 dp PULM: even/non-labored on MV, breathing over, lungs bilaterally coarse/rhonchi, +cough GI: distended, taut,  Hypo BS  Extremities: cool/dry, no edema, left tibial IO Skin: no rashes  LABS:  BMET  Recent Labs Lab 01/05/2017 0325 12/31/2016 0342  NA 142 143  K 4.4 4.3  CL 109 108  CO2 21*  --   BUN 27* 33*  CREATININE 2.01* 1.90*  GLUCOSE 134* 133*    Electrolytes  Recent Labs Lab 12/18/2016 0325  CALCIUM 10.6*    CBC  Recent  Labs Lab 12/28/2016 0325 12/30/2016 0342  WBC 17.5*  --   HGB 11.8* 12.9*  HCT 38.6* 38.0*  PLT 174  --     Coag's  Recent Labs Lab 12/26/2016 0325  INR 1.34    Sepsis Markers  Recent Labs Lab 12/15/2016 0342  LATICACIDVEN 4.92*    ABG No results for input(s): PHART, PCO2ART, PO2ART in the last 168 hours.  Liver Enzymes  Recent Labs Lab 12/19/2016 0325  AST 421*  ALT 348*  ALKPHOS 75  BILITOT 0.6  ALBUMIN 2.9*    Cardiac Enzymes  Recent Labs Lab 12/13/2016 0325  TROPONINI 0.43*    Glucose No results for input(s): GLUCAP in the last 168 hours.  Imaging Dg Chest Portable 1 View  Result Date: 12/29/2016 CLINICAL DATA:  83 year old male status post CPR and intubation. EXAM: PORTABLE CHEST 1 VIEW COMPARISON:  Chest CT dated 01/06/2017 FINDINGS: Endotracheal tube the tip approximately 3 cm above the carina. Enteric tube with tip and side-port above the diaphragm were likely in the hiatal hernia. Recommend advancing of the tube into the stomach. On Mild diffuse chronic interstitial coarsening. There is no focal consolidation, pleural effusion, or pneumothorax. There is mild enlargement of the cardiac silhouette. There is atherosclerotic calcification of the aortic arch. The aorta is slightly tortuous. Median sternotomy wires and CABG vascular clips noted. Fractures of the left lateral fourth and fifth ribs IMPRESSION: 1. Endotracheal tube above the carina. 2. Enteric tube with tip and side-port likely in the hiatal hernia. Recommend further advancing the tube into the stomach. 3. No acute cardiopulmonary process. 4. Fractures of the left lateral fourth and fifth ribs. Electronically Signed   By: Anner Crete M.D.   On: 12/28/2016 03:52   STUDIES:  CXR 10/31 >>  1. Endotracheal tube above the carina. 2. Enteric tube with tip and side-port likely in the hiatal hernia. Recommend further advancing the tube into the stomach. 3. No acute cardiopulmonary process. 4.  Fractures of the left lateral fourth and fifth ribs.  CT Head 10/31 >> CT Chest w/o contrast 10/31 >>  CULTURES: none  ANTIBIOTICS: none  SIGNIFICANT EVENTS: 10/31 Admit  LINES/TUBES: 10/31 ETT >> 10/31 OGT >>  DISCUSSION: 69 yoM w/extensive PMH with prolonged arrest from home, suspect > 60 mins of CPR, initial rhythm PEA to Vfib, x 6 defibs, 8-9 epi, intubated by EMS.  Remains unresponsive in ER.    ASSESSMENT / PLAN:  PULMONARY A: Acute respiratory failure in the setting of Cardiac Arrest Hx asthma/ osa- non compliant w/CPAP Rib fx - no ptx on CXR P:   Full vent support Trend CXR ABG now VAP measures Pulmicort and Brovanna nebs BID in place of home Symbicort Albuterol PRN CT chest pending  CARDIOVASCULAR A:  Cardiac Arrest- PEA/ ventricular fib Hx CAD s/p CABG, HTN, HLD, PVD, RBBB, Aflutter on Eliquis  P:  Tele monitoring  Appreciate Cardiology following Trend troponin and EKG Trend lactate Assess TTE Start heparin gtt if head and chest CT is negative Continue amio gtt per cards Goal map > 65 Assess Mag ASA Holding home Eliquis, crestor  RENAL A:   AKI P:   Trend BMP / mag/ phos/ urinary output Replace electrolytes as indicated Avoid nephrotoxic agents, ensure adequate renal perfusion  GASTROINTESTINAL A:   Transaminitis - ? Shock liver Hx GERD P:   Advance OGT to pass hiatal hernia, verify w/abd xry NPO PPI for SUP Trend LFTs  HEMATOLOGIC A:   Leukocytosis Anemia- chronic/ stable P:  Trend CBC Transfuse for Hgb <8 given cardiac disease SCDs for now, anticipate starting systemic heparin   INFECTIOUS A:   Leukocytosis- likely reactive, no obvious infiltrate on CXR, urine neg P:   Monitor clinically Assess PCT Trend WBC  ENDOCRINE A:   Hx DMT2, hypothyroidism (TSH 1.15 on 11/2016) P:   CBG q 4 SSI Synthroid IV   NEUROLOGIC A:   Acute encephalopathy concerning for severe anoxic injury given prolonged >60 mins cardiac  arrest Hx ? Early dementia per wife P:   RASS goal: 0 PRN fentanyl EEG Pending head CT TTM normothermia given prolonged arrest, advanced age  FAMILY  - Updates:  Wife, Barry Mcdaniel at bedside.  They have no children.  He has previous adopted children from previous marriage and has been estranged from for years.  She states he had verbalized that patient would not live in a vegetated state or with poor quality of life, and he would not want to live like this.  She understands patient's chronic conditions, reports recent worsening of dementia, and acute condition and is in agreeance to proceed with current therapies but with no escalation of care and changing to full DNR.  If patient does not improvement in neurological status, likely to transition to full comfort care.    - Inter-disciplinary family meet or Palliative Care meeting due by:  01/11/2017   Kennieth Rad, AGACNP-BC [place] [facility] Pgr: [phone] or if no answer [phone] 12/25/2016, 6:19 AM

## 2017-01-08 NOTE — Progress Notes (Signed)
Urine output 0-5 x 2 hours. Flushed foley with sterile technique with 20 ml of sterile water. Only return was the fluid instilled

## 2017-01-08 NOTE — Progress Notes (Signed)
EEG completed, results pending. 

## 2017-01-08 NOTE — ED Notes (Signed)
Notified EDP of critical Troponin.

## 2017-01-08 NOTE — Progress Notes (Signed)
ANTICOAGULATION CONSULT NOTE - Initial Consult  Pharmacy Consult for Heparin Indication: chest pain/ACS  Allergies  Allergen Reactions  . Penicillins Swelling    Eyes and lips Has patient had a PCN reaction causing immediate rash, facial/tongue/throat swelling, SOB or lightheadedness with hypotension: Yes Has patient had a PCN reaction causing severe rash involving mucus membranes or skin necrosis: No Has patient had a PCN reaction that required hospitalization No Has patient had a PCN reaction occurring within the last 10 years: No If all of the above answers are "NO", then may proceed with Cephalosporin use   . Prednisone Other (See Comments)    Hyperglycemia and AMS    Patient Measurements: Height: 5\' 8"  (172.7 cm) IBW/kg (Calculated) : 68.4 Heparin Dosing Weight: 80 kg  Vital Signs: Temp: 97.2 F (36.2 C) (10/31 0545) Temp Source: Temporal (10/31 0329) BP: 155/83 (10/31 0545) Pulse Rate: 73 (10/31 0545)  Labs:  Recent Labs  01/04/2017 0325 12/23/2016 0342 12/18/2016 0500  HGB 11.8* 12.9*  --   HCT 38.6* 38.0*  --   PLT 174  --   --   LABPROT 16.5*  --   --   INR 1.34  --   --   CREATININE 2.01* 1.90* 1.97*  TROPONINI 0.43*  --   --     Estimated Creatinine Clearance: 31.4 mL/min (A) (by C-G formula based on SCr of 1.97 mg/dL (H)).   Medical History: Past Medical History:  Diagnosis Date  . Anemia    hx of year ago no problems now per pt   . Arthritis    generalized   . Asthma   . Atypical chest pain   . Bladder neck contracture   . Chronic back pain   . Complication of anesthesia    POST OP HYPOTENSION  . Dementia    per wife pt had neurological evaluation  . Diverticulosis of colon   . First degree heart block   . GERD (gastroesophageal reflux disease)   . Heart murmur   . History of basal cell carcinoma excision    forehead and x6 area forearms  . History of Bell's palsy    x2  1998 &  2004 left side --  residual slight left eye droop and vace   . History of chronic bronchitis   . History of colon polyps   . History of gout   . History of hypertension   . History of MI (myocardial infarction)    08/ 2010  secondary acute renal failure/ dehydration--  medical management  . History of pulmonary embolus (PE)    2010  . Hypogonadism male   . Hypotension   . Hypothyroidism   . OSA (obstructive sleep apnea)    severe per study 2007/  non-compliant cpap  . Peripheral vascular disease ([place])   . Prostate carcinoma [facility]) urologist-  dr Gaynelle Arabian   DX 2007  Gleason 6  and recurrent 2012  s/p  cryoablation of prostate both times  . RBBB (right bundle branch block)   . Recovering alcoholic in remission ([place])   . Renal calculus, bilateral   . Type 2 diabetes mellitus ([facility])     Medications:  No current facility-administered medications on file prior to encounter.    Current Outpatient Prescriptions on File Prior to Encounter  Medication Sig Dispense Refill  . acetaminophen (TYLENOL) 500 MG tablet Take 500 mg by mouth every 8 (eight) hours as needed for mild pain, moderate pain or headache.     Marland Kitchen  albuterol (PROAIR HFA) 108 (90 Base) MCG/ACT inhaler Inhale 2 puffs into the lungs every 4 (four) hours as needed for wheezing or shortness of breath.    Marland Kitchen aspirin EC 81 MG tablet Take 81 mg by mouth daily.     . budesonide-formoterol (SYMBICORT) 160-4.5 MCG/ACT inhaler Inhale 2 puffs into the lungs 2 (two) times daily. 1 Inhaler 0  . dextromethorphan-guaiFENesin (ROBITUSSIN-DM) 10-100 MG/5ML liquid Take 10 mLs by mouth every 4 (four) hours as needed for cough.    Marland Kitchen ELIQUIS 5 MG TABS tablet TAKE ONE TABLET BY MOUTH TWICE DAILY 60 tablet 6  . famotidine (PEPCID) 20 MG tablet One at bedtime 30 tablet 2  . ferrous sulfate 325 (65 FE) MG EC tablet Take 325 mg by mouth daily with supper.     . finasteride (PROSCAR) 5 MG tablet Take 5 mg by mouth daily.     Marland Kitchen gabapentin (NEURONTIN) 300 MG capsule Take 300 mg by mouth 2 (two) times daily.    .  Hydrocodone-Acetaminophen 5-300 MG TABS Take 1 tablet by mouth every 4 (four) hours as needed.    . insulin lispro (HUMALOG KWIKPEN) 100 UNIT/ML KiwkPen If blood sugar is 250+, take 10 units, if blood sugar is over 350, take 15 units. Dx code E11.9 15 mL 2  . insulin lispro protamine-lispro (HUMALOG 75/25 MIX) (75-25) 100 UNIT/ML SUSP injection Inject 36 Units into the skin daily.    Marland Kitchen levothyroxine (SYNTHROID, LEVOTHROID) 112 MCG tablet TAKE 1 TABLET BY MOUTH ONCE DAILY 90 tablet 0  . nitroGLYCERIN (NITROSTAT) 0.4 MG SL tablet DISSOLVE ONE TABLET UNDER THE TONGUE EVERY 5 MINUTES AS NEEDED FOR CHEST PAIN.  DO NOT EXCEED A TOTAL OF 3 DOSES IN 15 MINUTES 25 tablet 5  . pantoprazole (PROTONIX) 40 MG tablet Take 1 tablet (40 mg total) by mouth daily. Take 30-60 min before first meal of the day 30 tablet 2  . Polyethylene Glycol 3350 (MIRALAX PO) Take 17 g by mouth as needed.    . rosuvastatin (CRESTOR) 10 MG tablet Take 1 tablet (10 mg total) by mouth daily. 90 tablet 2  . TOUJEO SOLOSTAR 300 UNIT/ML SOPN Inject 42 Units into the skin daily. 6 pen 2  . triamcinolone (NASACORT ALLERGY 24HR) 55 MCG/ACT AERO nasal inhaler Place 2 sprays into the nose 2 (two) times daily.       Assessment: 83 y.o. male s/p cardiac arrest for heparin.  On Eliquis PTA for Aflutter  Goal of Therapy:  APTT 66-102 Heparin level 0.3-0.7 units/ml Monitor platelets by anticoagulation protocol: Yes   Plan:  Start heparin 1000 units/hr APTT and heparin level in 8 hours  Alethea Terhaar, [place] Curb 12/13/2016,6:26 AM

## 2017-01-08 NOTE — Progress Notes (Signed)
eLink Physician-Brief Progress Note Patient Name: Barry KAMATH Sr. DOB: 09-30-1932 MRN: [id number]   Date of Service  01/01/2017  HPI/Events of Note  Contacted by bedside nurse with low urine output and ventilator asynchrony. Camera show patient asynchronous. Patient received 1 dose of bolus Versed at 8:30pm & had a couple of bolus Fentanyl during the day with up-titration of Fentanyl drip. Patient also shivering. Note reviewed. Patient's not on paralytic.   eICU Interventions  1. Increasing Bolus Versed as needed 2. Continuing Fentanyl drip 3. Continuing to monitor UOP with foley 4. Maintaining Normotension      Intervention Category Major Interventions: Other:  Tera Partridge 12/31/2016, 9:03 PM

## 2017-01-08 NOTE — ED Notes (Addendum)
Ice Packs applied at bilateral axilla and bilateral groin .

## 2017-01-08 NOTE — Progress Notes (Signed)
ANTICOAGULATION CONSULT NOTE - Consult  Pharmacy Consult for Heparin Indication: chest pain/ACS  Allergies  Allergen Reactions  . Penicillins Swelling    Eyes and lips Has patient had a PCN reaction causing immediate rash, facial/tongue/throat swelling, SOB or lightheadedness with hypotension: Yes Has patient had a PCN reaction causing severe rash involving mucus membranes or skin necrosis: No Has patient had a PCN reaction that required hospitalization No Has patient had a PCN reaction occurring within the last 10 years: No If all of the above answers are "NO", then may proceed with Cephalosporin use   . Prednisone Other (See Comments)    Hyperglycemia and AMS   Patient Measurements: Height: 5\' 8"  (172.7 cm) Weight: 191 lb 13.2 oz (87 kg) IBW/kg (Calculated) : 68.4 Heparin Dosing Weight: 80 kg  Vital Signs: Temp: 97.5 F (36.4 C) (10/31 1700) Temp Source: Core (Comment) (10/31 1700) BP: 130/84 (10/31 1800) Pulse Rate: 73 (10/31 1800)  Labs:  Recent Labs  12/13/2016 0325 12/21/2016 0342 12/22/2016 0500 12/21/2016 0854 12/29/2016 1622  HGB 11.8* 12.9*  --   --   --   HCT 38.6* 38.0*  --   --   --   PLT 174  --   --   --   --   APTT  --   --   --  27 122*  LABPROT 16.5*  --   --  16.4* 17.7*  INR 1.34  --   --  1.33 1.47  HEPARINUNFRC  --   --   --   --  >2.20*  CREATININE 2.01* 1.90* 1.97*  --  2.36*  TROPONINI 0.43*  --   --  19.67* 30.19*   Estimated Creatinine Clearance: 26.3 mL/min (A) (by C-G formula based on SCr of 2.36 mg/dL (H)).  Medical History: Past Medical History:  Diagnosis Date  . Anemia    hx of year ago no problems now per pt   . Arthritis    generalized   . Asthma   . Atypical chest pain   . Bladder neck contracture   . Chronic back pain   . Complication of anesthesia    POST OP HYPOTENSION  . Dementia    per wife pt had neurological evaluation  . Diverticulosis of colon   . First degree heart block   . GERD (gastroesophageal reflux disease)    . Heart murmur   . History of basal cell carcinoma excision    forehead and x6 area forearms  . History of Bell's palsy    x2  1998 &  2004 left side --  residual slight left eye droop and vace  . History of chronic bronchitis   . History of colon polyps   . History of gout   . History of hypertension   . History of MI (myocardial infarction)    08/ 2010  secondary acute renal failure/ dehydration--  medical management  . History of pulmonary embolus (PE)    2010  . Hypogonadism male   . Hypotension   . Hypothyroidism   . OSA (obstructive sleep apnea)    severe per study 2007/  non-compliant cpap  . Peripheral vascular disease ([place])   . Prostate carcinoma [facility]) urologist-  dr Gaynelle Arabian   DX 2007  Gleason 6  and recurrent 2012  s/p  cryoablation of prostate both times  . RBBB (right bundle branch block)   . Recovering alcoholic in remission ([place])   . Renal calculus, bilateral   . Type 2  diabetes mellitus ([place])    Medications:  No current facility-administered medications on file prior to encounter.    Current Outpatient Prescriptions on File Prior to Encounter  Medication Sig Dispense Refill  . ELIQUIS 5 MG TABS tablet TAKE ONE TABLET BY MOUTH TWICE DAILY 60 tablet 6  . famotidine (PEPCID) 20 MG tablet One at bedtime 30 tablet 2  . finasteride (PROSCAR) 5 MG tablet Take 5 mg by mouth daily.     Marland Kitchen gabapentin (NEURONTIN) 300 MG capsule Take 600 mg by mouth 2 (two) times daily.     . insulin lispro (HUMALOG KWIKPEN) 100 UNIT/ML KiwkPen If blood sugar is 250+, take 10 units, if blood sugar is over 350, take 15 units. Dx code E11.9 15 mL 2  . levothyroxine (SYNTHROID, LEVOTHROID) 112 MCG tablet TAKE 1 TABLET BY MOUTH ONCE DAILY 90 tablet 0  . nitroGLYCERIN (NITROSTAT) 0.4 MG SL tablet DISSOLVE ONE TABLET UNDER THE TONGUE EVERY 5 MINUTES AS NEEDED FOR CHEST PAIN.  DO NOT EXCEED A TOTAL OF 3 DOSES IN 15 MINUTES 25 tablet 5  . pantoprazole (PROTONIX) 40 MG tablet Take 1 tablet (40 mg  total) by mouth daily. Take 30-60 min before first meal of the day 30 tablet 2  . rosuvastatin (CRESTOR) 10 MG tablet Take 1 tablet (10 mg total) by mouth daily. 90 tablet 2  . TOUJEO SOLOSTAR 300 UNIT/ML SOPN Inject 42 Units into the skin daily. 6 pen 2  . acetaminophen (TYLENOL) 500 MG tablet Take 500 mg by mouth every 8 (eight) hours as needed for mild pain, moderate pain or headache.     . albuterol (PROAIR HFA) 108 (90 Base) MCG/ACT inhaler Inhale 2 puffs into the lungs every 4 (four) hours as needed for wheezing or shortness of breath.    Marland Kitchen aspirin EC 81 MG tablet Take 81 mg by mouth daily.     . budesonide-formoterol (SYMBICORT) 160-4.5 MCG/ACT inhaler Inhale 2 puffs into the lungs 2 (two) times daily. 1 Inhaler 0  . dextromethorphan-guaiFENesin (ROBITUSSIN-DM) 10-100 MG/5ML liquid Take 10 mLs by mouth every 4 (four) hours as needed for cough.    . ferrous sulfate 325 (65 FE) MG EC tablet Take 325 mg by mouth daily with supper.     . Polyethylene Glycol 3350 (MIRALAX PO) Take 17 g by mouth as needed (constipation).     . triamcinolone (NASACORT ALLERGY 24HR) 55 MCG/ACT AERO nasal inhaler Place 2 sprays into the nose 2 (two) times daily.    . [DISCONTINUED] insulin lispro protamine-lispro (HUMALOG 75/25 MIX) (75-25) 100 UNIT/ML SUSP injection Inject 36 Units into the skin daily.      Assessment: 83 y.o. male s/p cardiac arrest for heparin.  On Eliquis PTA for Aflutter; patient is therapeutic hypothermia protocol. HgB 12.9  aPTT supratherapeutic: 122 (heparin level is not correlating at this time 2/2 Eliquis PTA) NO bleeding noted per RN  Goal of Therapy:  APTT 66-102 Heparin level 0.3-0.7 units/ml Monitor platelets by anticoagulation protocol: Yes   Plan:  Reduce heparin gtt to 800 units/hr 0200 Heparin level Daily heparin level and CBC Monitor for s/sx of bleeding  Georga Bora, PharmD Clinical Pharmacist 01/04/2017 6:22 PM

## 2017-01-08 NOTE — Care Management Note (Signed)
Case Management Note  Patient Details  Name: Barry RILING Sr. MRN: [id number] Date of Birth: October 24, 1932  Subjective/Objective:    From home with wife, s/p cardiac arrest, conts on vent. [place] greater than 60 mins.                  Action/Plan: NCM will follow patient's progression.  Expected Discharge Date:                  Expected Discharge Plan:     In-House Referral:     Discharge planning Services  CM Consult  Post Acute Care Choice:    Choice offered to:     DME Arranged:    DME Agency:     HH Arranged:    HH Agency:     Status of Service:  In process, will continue to follow  If discussed at Long Length of Stay Meetings, dates discussed:    Additional Comments:  Zenon Mayo, RN 12/27/2016, 3:52 PM

## 2017-01-08 NOTE — ED Notes (Signed)
Patient transported to CT scan . 

## 2017-01-08 NOTE — Progress Notes (Signed)
ED RN advised lab was not able to add on aPTT to previously drawn PT/INR.  Called phlebotomy and was advised phlebotomy does not have current order for aPTT showing in their system.  Ordered aPTT and PT/INR for 0900 to coincide with current lab draw scheduled (troponin & lactic acid).

## 2017-01-08 NOTE — ED Notes (Signed)
Cleaned pt up, pt has watery brown stool.

## 2017-01-08 NOTE — ED Notes (Signed)
EDP/Cardiologist speaking with pt.'s family at this time .

## 2017-01-08 NOTE — Consult Note (Signed)
CARDIOLOGY CONSULT NOTE   Patient ID: Barry Goren Volcy Sr. MRN: [id number], DOB/AGE: Sep 06, 1932   Admit date: 12/26/2016 Date of Consult: 12/21/2016   Primary Physician: [name], MD Primary Cardiologist: Jenkins Rouge  Consulting Physician: Santa Lighter Reason for Consult: cardiac arrest, VF  Pt. Profile 83 yo male w/ 5v CABG in 2017, DMII, PVD and trifascicular block who presents after cardiac arrest at home, with ~1 hour of CPR  Problem List  Past Medical History:  Diagnosis Date  . Anemia    hx of year ago no problems now per pt   . Arthritis    generalized   . Asthma   . Atypical chest pain   . Bladder neck contracture   . Chronic back pain   . Complication of anesthesia    POST OP HYPOTENSION  . Dementia    per wife pt had neurological evaluation  . Diverticulosis of colon   . First degree heart block   . GERD (gastroesophageal reflux disease)   . Heart murmur   . History of basal cell carcinoma excision    forehead and x6 area forearms  . History of Bell's palsy    x2  1998 &  2004 left side --  residual slight left eye droop and vace  . History of chronic bronchitis   . History of colon polyps   . History of gout   . History of hypertension   . History of MI (myocardial infarction)    08/ 2010  secondary acute renal failure/ dehydration--  medical management  . History of pulmonary embolus (PE)    2010  . Hypogonadism male   . Hypotension   . Hypothyroidism   . OSA (obstructive sleep apnea)    severe per study 2007/  non-compliant cpap  . Peripheral vascular disease ([place])   . Prostate carcinoma [facility]) urologist-  dr Gaynelle Arabian   DX 2007  Gleason 6  and recurrent 2012  s/p  cryoablation of prostate both times  . RBBB (right bundle branch block)   . Recovering alcoholic in remission ([place])   . Renal calculus, bilateral   . Type 2 diabetes mellitus ([place])     Past Surgical History:  Procedure Laterality Date  . CARDIAC CATHETERIZATION N/A 11/27/2015    Procedure: Left Heart Cath and Coronary Angiography;  Surgeon: Peter M [name], MD;  Location: [place] [facility];  Service: Cardiovascular;  Laterality: N/A;  . CARDIOVASCULAR STRESS TEST  05-19-2012  dr Tressia Miners turner   normal perfusionstudy/  no ischemia/  attenuation artifact inferoapex region of myocardium/  note gate secondary to rhythm irregularity  . CARDIOVERSION N/A 01/05/2016   Procedure: CARDIOVERSION;  Surgeon: Sanda Klein, MD;  Location: [place];  Service: Cardiovascular;  Laterality: N/A;  . CATARACT EXTRACTION W/ INTRAOCULAR LENS IMPLANT Right 2014  . COLONOSCOPY W/ POLYPECTOMY  last one 08-10-2013  . CORONARY ARTERY BYPASS GRAFT N/A 11/29/2015   Procedure: CORONARY ARTERY BYPASS GRAFTING (CABG), ON PUMP, TIMES FIVE, USING LEFT INTERNAL MAMMARY ARTERY, RIGHT GREATER SAPHENOUS VEIN HARVESTED ENDOSCOPICALLY;  Surgeon: [name], MD;  Location: [place];  Service: Open Heart Surgery;  Laterality: N/A;  LIMA-LAD; SEQ SVG-OM1-OM2;SVG-DIAG; SVG-PL  . CYSTOSCOPY WITH URETHRAL DILATATION N/A 01/14/2014   Procedure: URETHRAL MEATAL DILATATION;  Surgeon: Ailene Rud, MD;  Location: [facility];  Service: Urology;  Laterality: N/A;  . EXCISION BENIGN SKIN LESION OF BACK  10/ 2011  . HIATAL HERNIA REPAIR  1983  . INGUINAL HERNIA  REPAIR Right 10-14-2001  . INTRAOPERATIVE TRANSESOPHAGEAL ECHOCARDIOGRAM N/A 11/29/2015   Procedure: INTRAOPERATIVE TRANSESOPHAGEAL ECHOCARDIOGRAM;  Surgeon: [name], MD;  Location: [place];  Service: Open Heart Surgery;  Laterality: N/A;  . KNEE ARTHROSCOPY W/ MENISCECTOMY Right 01-25-2003   and chondroplasty  . [place]  . PROSTATE CRYOABLATION  09-17-2010  &  04-12-2005  . REPAIR RIGHT ARM TENDON INJURY  1975  . SHOULDER OPEN ROTATOR CUFF REPAIR  03/20/2011   Procedure: ROTATOR CUFF REPAIR SHOULDER OPEN;  Surgeon: Tobi Bastos;  Location: [facility];  Service: Orthopedics;  Laterality: Right;  . TEE  WITHOUT CARDIOVERSION N/A 01/05/2016   Procedure: TRANSESOPHAGEAL ECHOCARDIOGRAM (TEE);  Surgeon: Sanda Klein, MD;  Location: [place];  Service: Cardiovascular;  Laterality: N/A;  . TEE WITHOUT CARDIOVERSION N/A 07/30/2016   Procedure: TRANSESOPHAGEAL ECHOCARDIOGRAM (TEE);  Surgeon: Skeet Latch, MD;  Location: [place];  Service: Cardiovascular;  Laterality: N/A;  . TONSILLECTOMY  as child  . TRANSTHORACIC ECHOCARDIOGRAM  01-11-2013  dr [name]   moderate LVH/  grade I diastolic dysfunction/ ef 45-80%/  mild AV stenosis /  mild MR/  mild LAE  . TRANSURETHRAL RESECTION OF BLADDER TUMOR N/A 01/14/2014   Procedure: TRANSURETHRAL INCISION OF BLADDER NECK CONTRACTURE;  Surgeon: Ailene Rud, MD;  Location: [facility];  Service: Urology;  Laterality: N/A;  . Vantus Implant     Hormones for Prostate  . VENTRAL HERNIA REPAIR  1998     Allergies  Allergies  Allergen Reactions  . Penicillins Swelling    Eyes and lips Has patient had a PCN reaction causing immediate rash, facial/tongue/throat swelling, SOB or lightheadedness with hypotension: Yes Has patient had a PCN reaction causing severe rash involving mucus membranes or skin necrosis: No Has patient had a PCN reaction that required hospitalization No Has patient had a PCN reaction occurring within the last 10 years: No If all of the above answers are "NO", then may proceed with Cephalosporin use   . Prednisone Other (See Comments)    Hyperglycemia and AMS    HPI  83 yo male w/ 5v CABG in 2017, DMII, PVD and trifascicular block who presents after cardiac arrest at home, with ~1 hour of CPR  His wife reports that they were sleeping, when he woke up suddenly clutching his chest and became unresponsive. She tried to arouse him, and then promptly started CPR. Fire/EMS were called and they continued CPR. Initially PEA then multiple episodes of VF requiring DCCV. He recevied 8-9mg  of epinepherine.   In the  ED, he is intubated with only a gag reflex, not on pressors and on amiodarone for arrhythmia suppression. No ST elevation on EKG.    Family History Family History  Problem Relation Age of Onset  . Heart disease Mother   . Stroke Father   . Cancer Brother        unsure of type  . Cancer Sister   . Diabetes Sister   . Diabetes Sister   . Stroke Brother   . Cancer Brother   . Cancer Brother   . Cancer Brother   . Cancer Brother   . Cancer Brother      Social History Social History   Social History  . Marital status: Married    Spouse name: N/A  . Number of children: N/A  . Years of education: N/A   Occupational History  . Retired      Personal assistant  Social History Main Topics  . Smoking status: Never Smoker  . Smokeless tobacco: Never Used  . Alcohol use No     Comment: RECOVERING ALCOHOLIC none since 7564   . Drug use: No  . Sexual activity: Not Currently    Birth control/ protection: None   Other Topics Concern  . Not on file   Social History Narrative  . No narrative on file     Review of Systems Intubated unable to assess  Physical Exam  Blood pressure (!) 175/84, pulse (!) 34, temperature (!) 96.8 F (36 C), resp. rate (!) 28, SpO2 100 %.  General: intubated  Neuro: unresponsive, pinpoint pupils, non-reactive, neg doll's, only gag HEENT: no JVD Lungs:  Resp regular and unlabored, CTA. Heart: RRR  Abdomen: Soft, non-tender, non-distended, BS + x 4.  Extremities: No clubbing, cyanosis or edema. DP/PT/Radials 2+ and equal bilaterally.IO in leg  Labs   Recent Labs  12/15/2016 0325  TROPONINI 0.43*   Lab Results  Component Value Date   WBC 17.5 (H) 01/03/2017   HGB 12.9 (L) 12/18/2016   HCT 38.0 (L) 12/22/2016   MCV 89.8 12/14/2016   PLT 174 01/06/2017    Recent Labs Lab 12/23/2016 0325 01/07/2017 0342  NA 142 143  K 4.4 4.3  CL 109 108  CO2 21*  --   BUN 27* 33*  CREATININE 2.01* 1.90*  CALCIUM 10.6*  --   PROT 5.6*  --   BILITOT  0.6  --   ALKPHOS 75  --   ALT 348*  --   AST 421*  --   GLUCOSE 134* 133*   Lab Results  Component Value Date   CHOL 117 10/31/2015   HDL 44.60 10/31/2015   LDLCALC 53 10/31/2015   TRIG 98.0 10/31/2015   Lab Results  Component Value Date   DDIMER (H) 10/18/2008    0.58        AT THE INHOUSE ESTABLISHED CUTOFF VALUE OF 0.48 ug/mL FEU, THIS ASSAY HAS BEEN DOCUMENTED IN THE LITERATURE TO HAVE A SENSITIVITY AND NEGATIVE PREDICTIVE VALUE OF AT LEAST 98 TO 99%.  THE TEST RESULT SHOULD BE CORRELATED WITH AN ASSESSMENT OF THE CLINICAL PROBABILITY OF DVT / VTE.    Radiology/Studies  Dg Chest 2 View  Result Date: 12/17/2016 CLINICAL DATA:  Cough variant asthma, diabetes mellitus, history prostate cancer, coronary artery disease post MI EXAM: CHEST  2 VIEW COMPARISON:  07/19/2016 FINDINGS: Upper normal size of cardiac silhouette post CABG. Atherosclerotic calcification aorta. Pulmonary vascularity mediastinal contours normal. Clear mild bronchitic changes without infiltrate, pleural effusion or pneumothorax. Bones demineralized. IMPRESSION: Bronchitic changes without infiltrate. Electronically Signed   By: Lavonia Dana M.D.   On: 12/17/2016 21:20   Ct Chest High Resolution  Result Date: 01/06/2017 CLINICAL DATA:  Persistent cough, recurrent bronchitis. EXAM: CT CHEST WITHOUT CONTRAST TECHNIQUE: Multidetector CT imaging of the chest was performed following the standard protocol without intravenous contrast. High resolution imaging of the lungs, as well as inspiratory and expiratory imaging, was performed. COMPARISON:  None. FINDINGS: Cardiovascular: Atherosclerotic calcification of the arterial vasculature. Heart is mildly enlarged. No pericardial effusion. Mediastinum/Nodes: Mediastinal lymph nodes are not enlarged by CT size criteria. Hilar regions are difficult to definitively evaluate without IV contrast. No axillary adenopathy. Esophagus is grossly unremarkable. Lungs/Pleura: Negative  for subpleural reticulation, traction bronchiectasis/ bronchiolectasis, ground-glass, architectural distortion or honeycombing. Scattered peribronchovascular nodularity, most likely postinfectious in etiology. Marked volume loss in the right middle lobe. No centrally obstructing lesion. No  pleural fluid. Airway is unremarkable. Upper Abdomen: Visualized portion of the liver is unremarkable. A small stone is seen in the gallbladder. Visualized portions of the adrenal glands, kidneys, spleen and pancreas are grossly unremarkable. Moderate hiatal hernia. Stomach and bowel are otherwise grossly unremarkable. No upper abdominal adenopathy. Musculoskeletal: Degenerative changes in the spine. A 9 mm soft tissue nodule is seen in the subcutaneous anterior left chest wall, 3 cm lateral to the left nipple. IMPRESSION: 1. No evidence of fibrotic interstitial lung disease. 2. Scattered pulmonary parenchymal scarring, likely postinfectious in etiology. Complete collapse of the right middle lobe without centrally obstructing lesion. Finding appears chronic. 3.  Aortic atherosclerosis (ICD10-170.0). 4. Cholelithiasis. 5. 9 mm subcutaneous nodule in the left anterior chest wall, likely amenable to direct palpation. Electronically Signed   By: Lorin Picket M.D.   On: 01/06/2017 16:11   Dg Chest Portable 1 View  Result Date: 01/07/2017 CLINICAL DATA:  83 year old male status post CPR and intubation. EXAM: PORTABLE CHEST 1 VIEW COMPARISON:  Chest CT dated 01/06/2017 FINDINGS: Endotracheal tube the tip approximately 3 cm above the carina. Enteric tube with tip and side-port above the diaphragm were likely in the hiatal hernia. Recommend advancing of the tube into the stomach. On Mild diffuse chronic interstitial coarsening. There is no focal consolidation, pleural effusion, or pneumothorax. There is mild enlargement of the cardiac silhouette. There is atherosclerotic calcification of the aortic arch. The aorta is slightly  tortuous. Median sternotomy wires and CABG vascular clips noted. Fractures of the left lateral fourth and fifth ribs IMPRESSION: 1. Endotracheal tube above the carina. 2. Enteric tube with tip and side-port likely in the hiatal hernia. Recommend further advancing the tube into the stomach. 3. No acute cardiopulmonary process. 4. Fractures of the left lateral fourth and fifth ribs. Electronically Signed   By: Anner Crete M.D.   On: 12/27/2016 03:52    ECG Bigeminy, no ST elevaion  Bedside TTE: Lateral wall hypokinesis, mild-moderately depressed LV EF  ASSESSMENT AND PLAN 83 yo male w/ 5v CABG in 2017, DMII, PVD and trifascicular block who presents after cardiac arrest likely ischemic in origin   # Cardiac Arrest: no evidence of ST elevation, no on pressors and w/o recurrence of VT/VF.  - not a candidate for cardiac catheterization given advanced age, poor functional capacity and prolonged CPR - agree with Amiodarone for VT suppression - agree with CT head and CT chest to r/o bleed. If no bleeding, would start a heparin gtt for ACS - Cooling protocol for 24-48hrs and re-assessing neurological status afterwards - Formal TTE in morning - hopefully given the high-quality CPR as evident by his lactate of only 4 he will make some meaningful recovery. We will need to see how his AKI and neurological status progresses.   Code status: DNR  Signed, Charlies Silvers, MD

## 2017-01-08 NOTE — Progress Notes (Signed)
Dr Halford Chessman aware of low urine output and glucose. Awaiting orders.

## 2017-01-08 NOTE — Progress Notes (Signed)
Interventional Cardiology Called by Dr Wyvonnia Dusky to see patient who presents via EMS after sustaining OOH cardiac arrest.   The patient is a history of CAD status post 5 vessel CABG in 2017.  He is followed by Dr. [name].  His wife provides a history that the patient began gasping and gurgling in bed tonight while he was sleeping.  He would not awaken.  She started CPR and called the at the patient apparently received about 1 hour of CPR.  He initially was in PEA but had multiple episodes of ventricular fibrillation requiring defibrillation.  He has been placed on amiodarone.  He received 8-9 mg of epinephrine per report.  He ultimately developed ROSC and currently is not requiring vasopressor support.  He remains comatose.  EKG reveals a wide QRS rhythm with no clear sinus beats and ventricular bigeminy is present.  There is no ST segment elevation.  I do not think the patient is a candidate for emergency cardiac catheterization considering his advanced age, poor functional capacity, prolonged CPR in the setting of out of hospital cardiac arrest.  His wife states that the patient is quite limited because of severe osteoarthritis and chronic pain.  She requests that he not receive further CPR or shocks.  It seems reasonable to continue his current level of care.  Therapeutic hypothermia may be pursued, will defer to the CCM team.  If the patient's CT of scan of the brain is negative, it would be reasonable to treat him with heparin while we are determining the cause of his arrest.  We will follow with the primary team.  Please see Dr Fanny Skates (cardiology fellow) full consultation note for further details. thanks  Sherren Mocha 12/22/2016 4:41 AM

## 2017-01-08 NOTE — Progress Notes (Signed)
Right ring finger ring, gold colored with black stone in center nad white stones  On each side, taken off finger as it was on the finger tight,. Placed in small bag , labeled, and placed on denture cup. When family comes back in will ask them to take both items home.

## 2017-01-08 NOTE — Progress Notes (Signed)
[facility] record of resuscitation response and times available within Media tab of this emr. Collapse to first CPR 2 minutes and collapse to ROSC 47 minutes per EMS data.

## 2017-01-09 ENCOUNTER — Encounter (HOSPITAL_COMMUNITY): Payer: Self-pay | Admitting: *Deleted

## 2017-01-09 ENCOUNTER — Inpatient Hospital Stay (HOSPITAL_COMMUNITY): Payer: Medicare Other

## 2017-01-09 LAB — CBC
HEMATOCRIT: 40 % (ref 39.0–52.0)
HEMOGLOBIN: 12.7 g/dL — AB (ref 13.0–17.0)
MCH: 28.5 pg (ref 26.0–34.0)
MCHC: 31.8 g/dL (ref 30.0–36.0)
MCV: 89.9 fL (ref 78.0–100.0)
Platelets: 147 10*3/uL — ABNORMAL LOW (ref 150–400)
RBC: 4.45 MIL/uL (ref 4.22–5.81)
RDW: 16.3 % — AB (ref 11.5–15.5)
WBC: 21.4 10*3/uL — AB (ref 4.0–10.5)

## 2017-01-09 LAB — HEPARIN LEVEL (UNFRACTIONATED): Heparin Unfractionated: >2.2 IU/mL — ABNORMAL HIGH (ref 0.30–0.70)

## 2017-01-09 LAB — BASIC METABOLIC PANEL
Anion gap: 18 — ABNORMAL HIGH (ref 5–15)
BUN: 47 mg/dL — AB (ref 6–20)
CALCIUM: 8.4 mg/dL — AB (ref 8.9–10.3)
CHLORIDE: 113 mmol/L — AB (ref 101–111)
CO2: 12 mmol/L — AB (ref 22–32)
CREATININE: 3.1 mg/dL — AB (ref 0.61–1.24)
GFR calc Af Amer: 20 mL/min — ABNORMAL LOW (ref >60)
GFR calc non Af Amer: 17 mL/min — ABNORMAL LOW (ref >60)
GLUCOSE: 85 mg/dL (ref 65–99)
Potassium: 5.1 mmol/L (ref 3.5–5.1)
Sodium: 143 mmol/L (ref 135–145)

## 2017-01-09 LAB — GLUCOSE, CAPILLARY
GLUCOSE-CAPILLARY: 71 mg/dL (ref 65–99)
GLUCOSE-CAPILLARY: 80 mg/dL (ref 65–99)
GLUCOSE-CAPILLARY: 169 mg/dL — AB (ref 65–99)
Glucose-Capillary: 91 mg/dL (ref 65–99)
Glucose-Capillary: 135 mg/dL — ABNORMAL HIGH (ref 65–99)
Glucose-Capillary: 173 mg/dL — ABNORMAL HIGH (ref 65–99)

## 2017-01-09 LAB — BLOOD GAS, ARTERIAL
ACID-BASE DEFICIT: 12.8 mmol/L — AB (ref 0.0–2.0)
BICARBONATE: 13.2 mmol/L — AB (ref 20.0–28.0)
Drawn by: 10006
FIO2: 40
LHR: 18 {breaths}/min
MECHVT: 520 mL
O2 SAT: 98.1 %
PATIENT TEMPERATURE: 96.1
PCO2 ART: 30.3 mmHg — AB (ref 32.0–48.0)
PEEP/CPAP: 5 cmH2O
PH ART: 7.253 — AB (ref 7.350–7.450)
PO2 ART: 129 mmHg — AB (ref 83.0–108.0)

## 2017-01-09 LAB — TROPONIN I
Troponin I: 34.42 ng/mL (ref <0.03)
Troponin I: 29.63 ng/mL (ref <0.03)

## 2017-01-09 LAB — APTT
aPTT: 155 seconds — ABNORMAL HIGH (ref 24–36)
aPTT: 59 seconds — ABNORMAL HIGH (ref 24–36)

## 2017-01-09 LAB — MAGNESIUM
Magnesium: 2.3 mg/dL (ref 1.7–2.4)
Magnesium: 2.2 mg/dL (ref 1.7–2.4)

## 2017-01-09 LAB — PHOSPHORUS
Phosphorus: 6.4 mg/dL — ABNORMAL HIGH (ref 2.5–4.6)
Phosphorus: 7.3 mg/dL — ABNORMAL HIGH (ref 2.5–4.6)

## 2017-01-09 MED ORDER — PANTOPRAZOLE SODIUM 40 MG PO PACK
40.0000 mg | PACK | ORAL | Status: DC
  Administered 2017-01-09 – 2017-01-10 (×2): 40 mg
  Filled 2017-01-09 (×2): qty 20

## 2017-01-09 MED ORDER — DEXTROSE 5 % IV SOLN
INTRAVENOUS | Status: DC
  Administered 2017-01-09 (×2): via INTRAVENOUS
  Filled 2017-01-09 (×3): qty 150

## 2017-01-09 MED ORDER — SODIUM CHLORIDE 0.9 % IV SOLN
0.0000 ug/min | INTRAVENOUS | Status: DC
  Administered 2017-01-09: 20 ug/min via INTRAVENOUS
  Filled 2017-01-09 (×2): qty 1

## 2017-01-09 MED ORDER — VITAL AF 1.2 CAL PO LIQD
1000.0000 mL | ORAL | Status: DC
  Administered 2017-01-09: 1000 mL

## 2017-01-09 MED ORDER — AMIODARONE HCL 200 MG PO TABS
400.0000 mg | ORAL_TABLET | Freq: Two times a day (BID) | ORAL | Status: DC
  Administered 2017-01-09 – 2017-01-10 (×3): 400 mg
  Filled 2017-01-09 (×3): qty 2

## 2017-01-09 MED ORDER — SODIUM CHLORIDE 0.9 % IV SOLN
INTRAVENOUS | Status: DC
  Administered 2017-01-10: 02:00:00 via INTRAVENOUS

## 2017-01-09 MED ORDER — CLOPIDOGREL BISULFATE 300 MG PO TABS
300.0000 mg | ORAL_TABLET | Freq: Once | ORAL | Status: AC
  Administered 2017-01-09: 300 mg
  Filled 2017-01-09: qty 1

## 2017-01-09 MED ORDER — LEVOTHYROXINE SODIUM 100 MCG PO TABS
100.0000 ug | ORAL_TABLET | Freq: Every day | ORAL | Status: DC
  Administered 2017-01-10: 100 ug
  Filled 2017-01-09: qty 1

## 2017-01-09 MED ORDER — SODIUM CHLORIDE 0.9 % IV BOLUS (SEPSIS)
2000.0000 mL | Freq: Once | INTRAVENOUS | Status: AC
  Administered 2017-01-09: 2000 mL via INTRAVENOUS

## 2017-01-09 MED ORDER — CLOPIDOGREL BISULFATE 75 MG PO TABS
75.0000 mg | ORAL_TABLET | Freq: Every day | ORAL | Status: DC
  Administered 2017-01-10: 75 mg via ORAL
  Filled 2017-01-09: qty 1

## 2017-01-09 MED ORDER — VITAL HIGH PROTEIN PO LIQD
1000.0000 mL | ORAL | Status: DC
  Administered 2017-01-09: 1000 mL

## 2017-01-09 MED ORDER — PRO-STAT SUGAR FREE PO LIQD
30.0000 mL | Freq: Two times a day (BID) | ORAL | Status: DC
  Administered 2017-01-09: 30 mL
  Filled 2017-01-09: qty 30

## 2017-01-09 MED ORDER — CLINDAMYCIN PHOSPHATE 600 MG/50ML IV SOLN
600.0000 mg | Freq: Three times a day (TID) | INTRAVENOUS | Status: DC
  Administered 2017-01-09 – 2017-01-10 (×4): 600 mg via INTRAVENOUS
  Filled 2017-01-09 (×4): qty 50

## 2017-01-09 MED ORDER — HEPARIN (PORCINE) IN NACL 100-0.45 UNIT/ML-% IJ SOLN
1000.0000 [IU]/h | INTRAMUSCULAR | Status: DC
  Administered 2017-01-09: 600 [IU]/h via INTRAVENOUS

## 2017-01-09 NOTE — Progress Notes (Signed)
ANTICOAGULATION CONSULT NOTE - Consult  Pharmacy Consult for Heparin Indication: chest pain/ACS  Allergies  Allergen Reactions  . Penicillins Swelling    Eyes and lips Has patient had a PCN reaction causing immediate rash, facial/tongue/throat swelling, SOB or lightheadedness with hypotension: Yes Has patient had a PCN reaction causing severe rash involving mucus membranes or skin necrosis: No Has patient had a PCN reaction that required hospitalization No Has patient had a PCN reaction occurring within the last 10 years: No If all of the above answers are "NO", then may proceed with Cephalosporin use   . Prednisone Other (See Comments)    Hyperglycemia and AMS   Patient Measurements: Height: 5\' 8"  (172.7 cm) Weight: 191 lb 13.2 oz (87 kg) IBW/kg (Calculated) : 68.4 Heparin Dosing Weight: 80 kg  Vital Signs: Temp: 97.2 F (36.2 C) (11/01 1900) Temp Source: Core (Comment) (11/01 1900) BP: 125/72 (11/01 2000) Pulse Rate: 67 (11/01 2000)  Labs:  Recent Labs  12/22/2016 0325 12/11/2016 0342 12/23/2016 0500  12/21/2016 0854  01/03/2017 1622 12/15/2016 2313 01/09/17 0330 01/09/17 0547 01/09/17 1858  HGB 11.8* 12.9*  --   --   --   --   --   --  12.7*  --   --   HCT 38.6* 38.0*  --   --   --   --   --   --  40.0  --   --   PLT 174  --   --   --   --   --   --   --  147*  --   --   APTT  --   --   --   < > 27  --  122*  --  155* >200* 59*  LABPROT 16.5*  --   --   --  16.4*  --  17.7*  --   --   --   --   INR 1.34  --   --   --  1.33  --  1.47  --   --   --   --   HEPARINUNFRC  --   --   --   --   --   < > >2.20*  --  >2.20* >2.20* >2.20*  CREATININE 2.01* 1.90* 1.97*  --   --   --  2.36*  --   --  3.10*  --   TROPONINI 0.43*  --   --   --  19.67*  --  30.19* 34.42*  --  29.63*  --   < > = values in this interval not displayed. Estimated Creatinine Clearance: 20 mL/min (A) (by C-G formula based on SCr of 3.1 mg/dL (H)).  Medical History: Past Medical History:  Diagnosis Date  .  Anemia    hx of year ago no problems now per pt   . Arthritis    generalized   . Asthma   . Atypical chest pain   . Bladder neck contracture   . Chronic back pain   . Complication of anesthesia    POST OP HYPOTENSION  . Dementia    per wife pt had neurological evaluation  . Diverticulosis of colon   . First degree heart block   . GERD (gastroesophageal reflux disease)   . Heart murmur   . History of basal cell carcinoma excision    forehead and x6 area forearms  . History of Bell's palsy    x2  1998 &  2004 left side --  residual slight left eye droop and vace  . History of chronic bronchitis   . History of colon polyps   . History of gout   . History of hypertension   . History of MI (myocardial infarction)    08/ 2010  secondary acute renal failure/ dehydration--  medical management  . History of pulmonary embolus (PE)    2010  . Hypogonadism male   . Hypotension   . Hypothyroidism   . OSA (obstructive sleep apnea)    severe per study 2007/  non-compliant cpap  . Peripheral vascular disease ([place])   . Prostate carcinoma [facility]) urologist-  dr Gaynelle Arabian   DX 2007  Gleason 6  and recurrent 2012  s/p  cryoablation of prostate both times  . RBBB (right bundle branch block)   . Recovering alcoholic in remission ([place])   . Renal calculus, bilateral   . Type 2 diabetes mellitus (HCC)    Medications:  No current facility-administered medications on file prior to encounter.    Current Outpatient Prescriptions on File Prior to Encounter  Medication Sig Dispense Refill  . ELIQUIS 5 MG TABS tablet TAKE ONE TABLET BY MOUTH TWICE DAILY 60 tablet 6  . famotidine (PEPCID) 20 MG tablet One at bedtime 30 tablet 2  . finasteride (PROSCAR) 5 MG tablet Take 5 mg by mouth daily.     Marland Kitchen gabapentin (NEURONTIN) 300 MG capsule Take 600 mg by mouth 2 (two) times daily.     . insulin lispro (HUMALOG KWIKPEN) 100 UNIT/ML KiwkPen If blood sugar is 250+, take 10 units, if blood sugar is over 350,  take 15 units. Dx code E11.9 15 mL 2  . levothyroxine (SYNTHROID, LEVOTHROID) 112 MCG tablet TAKE 1 TABLET BY MOUTH ONCE DAILY 90 tablet 0  . nitroGLYCERIN (NITROSTAT) 0.4 MG SL tablet DISSOLVE ONE TABLET UNDER THE TONGUE EVERY 5 MINUTES AS NEEDED FOR CHEST PAIN.  DO NOT EXCEED A TOTAL OF 3 DOSES IN 15 MINUTES 25 tablet 5  . pantoprazole (PROTONIX) 40 MG tablet Take 1 tablet (40 mg total) by mouth daily. Take 30-60 min before first meal of the day 30 tablet 2  . rosuvastatin (CRESTOR) 10 MG tablet Take 1 tablet (10 mg total) by mouth daily. 90 tablet 2  . TOUJEO SOLOSTAR 300 UNIT/ML SOPN Inject 42 Units into the skin daily. 6 pen 2  . acetaminophen (TYLENOL) 500 MG tablet Take 500 mg by mouth every 8 (eight) hours as needed for mild pain, moderate pain or headache.     . albuterol (PROAIR HFA) 108 (90 Base) MCG/ACT inhaler Inhale 2 puffs into the lungs every 4 (four) hours as needed for wheezing or shortness of breath.    Marland Kitchen aspirin EC 81 MG tablet Take 81 mg by mouth daily.     . budesonide-formoterol (SYMBICORT) 160-4.5 MCG/ACT inhaler Inhale 2 puffs into the lungs 2 (two) times daily. 1 Inhaler 0  . dextromethorphan-guaiFENesin (ROBITUSSIN-DM) 10-100 MG/5ML liquid Take 10 mLs by mouth every 4 (four) hours as needed for cough.    . ferrous sulfate 325 (65 FE) MG EC tablet Take 325 mg by mouth daily with supper.     . Polyethylene Glycol 3350 (MIRALAX PO) Take 17 g by mouth as needed (constipation).     . triamcinolone (NASACORT ALLERGY 24HR) 55 MCG/ACT AERO nasal inhaler Place 2 sprays into the nose 2 (two) times daily.      Assessment: 83 y.o. male s/p cardiac arrest for heparin. Troponin peaked at 34.42 and  has been trending down since. On Eliquis PTA for Aflutter, last dose unknown. Patient is therapeutic hypothermia protocol; started rewarming 11/1.   aPTT subtherapeutic on heparin 600 units/hr after holding heparin x1 hr and rate decrease for high level. Heparin level remains high with  influence from Eliqius.  Hgb is stable. Platelets down slightly. No signs/symptoms of bleeding. No interruptions to the heparin infusion.   Goal of Therapy:  APTT 66-102 Heparin level 0.3-0.7 units/ml Monitor platelets by anticoagulation protocol: Yes   Plan:  Increase heparin rate to 650 units/hr Repeat heparin level, aPTT in 8 hours  Daily heparin level, aPTT, and CBC Monitor for s/sx of bleeding  Carlean Jews, Pharm.D. 9:14 PM 01/09/2017

## 2017-01-09 NOTE — Progress Notes (Signed)
PULMONARY / CRITICAL CARE MEDICINE   Name: Barry STUCK Sr. MRN: [id number] DOB: 02-01-1933    ADMISSION DATE:  01/06/2017 CONSULTATION DATE:  01/05/2017  REFERRING MD:  Dr. Wyvonnia Dusky  CHIEF COMPLAINT:  Cardiac Arrest  HISTORY OF PRESENT ILLNESS:   83 yo male woke up with a gasp and gurgling.  EMS called and pt found to be in PEA and then VF.  ROSC after about 60 minutes.  Found to have wide QRS. PMHx of HTN, DM, HLD, OSA, Asthma, PAD, CAD s/p CABG  SUBJECTIVE:  Remains on full vent support.  VITAL SIGNS: BP (!) 107/59 (BP Location: Left Arm)   Pulse (!) 58   Temp (!) 96.1 F (35.6 C) (Core (Comment))   Resp 18   Ht 5\' 8"  (1.727 m)   Wt 191 lb 13.2 oz (87 kg)   SpO2 100%   BMI 29.17 kg/m   VENTILATOR SETTINGS: Vent Mode: PRVC FiO2 (%):  [40 %] 40 % Set Rate:  [16 bmp-18 bmp] 18 bmp Vt Set:  [520 mL] 520 mL PEEP:  [5 cmH20] 5 cmH20 Plateau Pressure:  [13 cmH20-23 cmH20] 23 cmH20  INTAKE / OUTPUT: I/O last 3 completed shifts: In: 4377.5 [I.V.:2717.5; Other:20; NG/GT:140; IV Piggyback:1500] Out: 163 [Urine:163]  PHYSICAL EXAMINATION:  General - sedated Eyes - pupils reactive ENT - ETT in place Cardiac - irregular, 2/6 murmur Chest - basilar rales LT > RT Abd - soft, non tender Ext - no edema Skin - pressure ulcers on heels Neuro - opens eyes with stimulation, does not follow commands  LABS:  BMET  Recent Labs Lab 01/06/2017 0500 12/17/2016 1622 01/09/17 0547  NA 141 140 143  K 4.4 5.3* 5.1  CL 110 112* 113*  CO2 18* 16* 12*  BUN 30* 40* 47*  CREATININE 1.97* 2.36* 3.10*  GLUCOSE 112* 212* 85    Electrolytes  Recent Labs Lab 01/05/2017 0500 01/04/2017 1622 01/09/17 0547  CALCIUM 9.9 8.9 8.4*  MG 2.4  --   --     CBC  Recent Labs Lab 12/20/2016 0325 12/18/2016 0342 01/09/17 0330  WBC 17.5*  --  21.4*  HGB 11.8* 12.9* 12.7*  HCT 38.6* 38.0* 40.0  PLT 174  --  147*    Coag's  Recent Labs Lab 12/09/2016 0325  12/11/2016 0854 12/29/2016 1622  01/09/17 0330 01/09/17 0547  APTT  --   < > 27 122* 155* >200*  INR 1.34  --  1.33 1.47  --   --   < > = values in this interval not displayed.  Sepsis Markers  Recent Labs Lab 01/02/2017 0500 01/02/2017 0556 12/14/2016 0607 12/19/2016 0846  LATICACIDVEN  --  2.6* 2.41* 4.1*  PROCALCITON <0.10  --   --   --     ABG  Recent Labs Lab 12/28/2016 0502 01/09/17 0530  PHART 7.339* 7.253*  PCO2ART 46.2 30.3*  PO2ART 536.0* 129*    Liver Enzymes  Recent Labs Lab 01/07/2017 0325 12/14/2016 0500  AST 421* 475*  ALT 348* 380*  ALKPHOS 75 77  BILITOT 0.6 0.7  ALBUMIN 2.9* 3.2*    Cardiac Enzymes  Recent Labs Lab 12/24/2016 1622 12/18/2016 2313 01/09/17 0547  TROPONINI 30.19* 34.42* 29.63*    Glucose  Recent Labs Lab 12/15/2016 1620 12/19/2016 1933 12/22/2016 2343 01/09/17 0337 01/09/17 0521 01/09/17 0751  GLUCAP 203* 152* 130* 71 80 91    Imaging No results found.   STUDIES:  CT head 10/31 >> atrophy and chronic ischemic microangiopathy  CT chest 10/31 >> mod HH, chronic RML volume loss, debris in airways, gallstone, non displaced Rt anterior 2nd to 6th rib fx, minimally displaced Lt lateral 4th to 5th rib fx EEG 10/31 >> diffuse background suppression Echo 10/31 >> EF 25 to 30%, mod AS, mod MR  CULTURES: Sputum 11/01 >>  ANTIBIOTICS: Clindamycin 11/01 >>   SIGNIFICANT EVENTS: 10/31 Admit, TTM 36 degrees  LINES/TUBES: 10/31 ETT >>  DISCUSSION: 83 yo with cardiac arrest with ROSC after 60 minutes, VDRF, AKI, aspiration pneumonia, Rib fx with possible Lt PTX.  ASSESSMENT / PLAN:  PEA leading to VF cardiac arrest. CAD s/p CABG, HTN, HLD, A flutter. Acute systolic CHF. - continue amiodarone, heparin gtt  Acute hypoxic respiratory failure. Rib fx's with possible Lt PTX. Hx of asthma. - full vent support - repeat CXR >> if PTX progresses, then will need chest tube - scheduled BDs  Aspiration pneumonia. - hx of PCN allergy - add clindamycin 11/01  Acute  renal failure with ATN. Metabolic acidosis. - add HCO3 to IV fluid - monitor renal fx, urine outpt - defer nephrology consult for now  Elevated LFTs 2nd to shock. Hx of GERD with hiatal hernia. - f/u LFTs  DM type II. Hx of hypothyroidism. - SSI - continue synthroid  Acute metabolic encephalopathy with anoxic encephalopathy. - TTM at 36 degrees  DVT prophylaxis - heparin gtt SUP - protonix Nutrition - tube feeds Goals of care - DNR  Updated pt's wife at bedside about plan.  CC time 32 minutes  Chesley Mires, MD [place] 01/09/2017, 9:32 AM Pager:  [phone] After 3pm call: [phone]

## 2017-01-09 NOTE — Progress Notes (Addendum)
ANTICOAGULATION CONSULT NOTE - Consult  Pharmacy Consult for Heparin Indication: chest pain/ACS  Allergies  Allergen Reactions  . Penicillins Swelling    Eyes and lips Has patient had a PCN reaction causing immediate rash, facial/tongue/throat swelling, SOB or lightheadedness with hypotension: Yes Has patient had a PCN reaction causing severe rash involving mucus membranes or skin necrosis: No Has patient had a PCN reaction that required hospitalization No Has patient had a PCN reaction occurring within the last 10 years: No If all of the above answers are "NO", then may proceed with Cephalosporin use   . Prednisone Other (See Comments)    Hyperglycemia and AMS   Patient Measurements: Height: 5\' 8"  (172.7 cm) Weight: 191 lb 13.2 oz (87 kg) IBW/kg (Calculated) : 68.4 Heparin Dosing Weight: 80 kg  Vital Signs: Temp: 96.1 F (35.6 C) (11/01 0800) Temp Source: Core (Comment) (11/01 0800) BP: 107/59 (11/01 0800) Pulse Rate: 58 (11/01 0800)  Labs:  Recent Labs  01/07/2017 0325 12/16/2016 0342 12/12/2016 0500  12/10/2016 0854 12/30/2016 1622 12/24/2016 2313 01/09/17 0330 01/09/17 0547  HGB 11.8* 12.9*  --   --   --   --   --  12.7*  --   HCT 38.6* 38.0*  --   --   --   --   --  40.0  --   PLT 174  --   --   --   --   --   --  147*  --   APTT  --   --   --   < > 27 122*  --  155* >200*  LABPROT 16.5*  --   --   --  16.4* 17.7*  --   --   --   INR 1.34  --   --   --  1.33 1.47  --   --   --   HEPARINUNFRC  --   --   --   --   --  >2.20*  --  >2.20* >2.20*  CREATININE 2.01* 1.90* 1.97*  --   --  2.36*  --   --  3.10*  TROPONINI 0.43*  --   --   --  19.67* 30.19* 34.42*  --  29.63*  < > = values in this interval not displayed. Estimated Creatinine Clearance: 20 mL/min (A) (by C-G formula based on SCr of 3.1 mg/dL (H)).  Medical History: Past Medical History:  Diagnosis Date  . Anemia    hx of year ago no problems now per pt   . Arthritis    generalized   . Asthma   . Atypical  chest pain   . Bladder neck contracture   . Chronic back pain   . Complication of anesthesia    POST OP HYPOTENSION  . Dementia    per wife pt had neurological evaluation  . Diverticulosis of colon   . First degree heart block   . GERD (gastroesophageal reflux disease)   . Heart murmur   . History of basal cell carcinoma excision    forehead and x6 area forearms  . History of Bell's palsy    x2  1998 &  2004 left side --  residual slight left eye droop and vace  . History of chronic bronchitis   . History of colon polyps   . History of gout   . History of hypertension   . History of MI (myocardial infarction)    08/ 2010  secondary acute renal failure/ dehydration--  medical management  . History of pulmonary embolus (PE)    2010  . Hypogonadism male   . Hypotension   . Hypothyroidism   . OSA (obstructive sleep apnea)    severe per study 2007/  non-compliant cpap  . Peripheral vascular disease ([place])   . Prostate carcinoma [facility]) urologist-  dr Gaynelle Arabian   DX 2007  Gleason 6  and recurrent 2012  s/p  cryoablation of prostate both times  . RBBB (right bundle branch block)   . Recovering alcoholic in remission ([place])   . Renal calculus, bilateral   . Type 2 diabetes mellitus (HCC)    Medications:  No current facility-administered medications on file prior to encounter.    Current Outpatient Prescriptions on File Prior to Encounter  Medication Sig Dispense Refill  . ELIQUIS 5 MG TABS tablet TAKE ONE TABLET BY MOUTH TWICE DAILY 60 tablet 6  . famotidine (PEPCID) 20 MG tablet One at bedtime 30 tablet 2  . finasteride (PROSCAR) 5 MG tablet Take 5 mg by mouth daily.     Marland Kitchen gabapentin (NEURONTIN) 300 MG capsule Take 600 mg by mouth 2 (two) times daily.     . insulin lispro (HUMALOG KWIKPEN) 100 UNIT/ML KiwkPen If blood sugar is 250+, take 10 units, if blood sugar is over 350, take 15 units. Dx code E11.9 15 mL 2  . levothyroxine (SYNTHROID, LEVOTHROID) 112 MCG tablet TAKE 1 TABLET  BY MOUTH ONCE DAILY 90 tablet 0  . nitroGLYCERIN (NITROSTAT) 0.4 MG SL tablet DISSOLVE ONE TABLET UNDER THE TONGUE EVERY 5 MINUTES AS NEEDED FOR CHEST PAIN.  DO NOT EXCEED A TOTAL OF 3 DOSES IN 15 MINUTES 25 tablet 5  . pantoprazole (PROTONIX) 40 MG tablet Take 1 tablet (40 mg total) by mouth daily. Take 30-60 min before first meal of the day 30 tablet 2  . rosuvastatin (CRESTOR) 10 MG tablet Take 1 tablet (10 mg total) by mouth daily. 90 tablet 2  . TOUJEO SOLOSTAR 300 UNIT/ML SOPN Inject 42 Units into the skin daily. 6 pen 2  . acetaminophen (TYLENOL) 500 MG tablet Take 500 mg by mouth every 8 (eight) hours as needed for mild pain, moderate pain or headache.     . albuterol (PROAIR HFA) 108 (90 Base) MCG/ACT inhaler Inhale 2 puffs into the lungs every 4 (four) hours as needed for wheezing or shortness of breath.    Marland Kitchen aspirin EC 81 MG tablet Take 81 mg by mouth daily.     . budesonide-formoterol (SYMBICORT) 160-4.5 MCG/ACT inhaler Inhale 2 puffs into the lungs 2 (two) times daily. 1 Inhaler 0  . dextromethorphan-guaiFENesin (ROBITUSSIN-DM) 10-100 MG/5ML liquid Take 10 mLs by mouth every 4 (four) hours as needed for cough.    . ferrous sulfate 325 (65 FE) MG EC tablet Take 325 mg by mouth daily with supper.     . Polyethylene Glycol 3350 (MIRALAX PO) Take 17 g by mouth as needed (constipation).     . triamcinolone (NASACORT ALLERGY 24HR) 55 MCG/ACT AERO nasal inhaler Place 2 sprays into the nose 2 (two) times daily.      Assessment: 83 y.o. male s/p cardiac arrest for heparin.  On Eliquis PTA for Aflutter; patient is therapeutic hypothermia protocol. Troponin peaked at 34.42 and has been trending down since.   aPTT supratherapeutic and heparin level were elevated earlier today, possibly related to drawn site and where heparin is infusing. Repeat level remained supratherapeutic upon redraw (Hl >2.2, aPTT>200). Hgb is stable. Platelets down slightly.  No signs/symptoms of bleeding. No interruptions to  the heparin infusion.   Heparin infusion was stopped at 0830.  Goal of Therapy:  APTT 66-102 Heparin level 0.3-0.7 units/ml Monitor platelets by anticoagulation protocol: Yes   Plan:  Hold heparin infusion for 1 hour Reduce heparin rate to 600 units/hr Repeat heparin level, aPTT in 8 hours Daily heparin level, aPTT, and CBC Monitor for s/sx of bleeding  Doylene Canard, PharmD Clinical Pharmacist  Pager: [phone] Clinical Phone for 01/09/2017 until 3:30pm: [phone] If after 3:30pm, please call main pharmacy at [phone]

## 2017-01-09 NOTE — Progress Notes (Signed)
eLink Physician-Brief Progress Note Patient Name: Barry SHADDOCK Sr. DOB: [birth year] MRN: [id number]   Date of Service  01/09/2017  HPI/Events of Note  Notified by bedside nurse of worsening hypotension. Echocardiogram reviewed from earlier showing systolic congestive heart failure with EF 25-30 %. Notably patient has global hypokinesis with inferior akinesis. Patient also has moderate regurgitation of the mitral valve, trivial regurgitation of the aortic valve, and moderate stenosis of the aortic valve. Currently with FiO2 0.4. Patient with peripheral IV access only.   eICU Interventions  1. Ordering 2 L normal saline bolus 2. Ordering low-dose peripherally infused Neo-Synephrine to maintain mean arterial pressure 3. Monitoring vitals per protocol 4. Continuing telemetry monitoring 5. Requested nursing staff notify me if patient continued to deteriorate      Intervention Category Major Interventions: Shock - evaluation and management  Tera Partridge 01/09/2017, 12:26 AM

## 2017-01-09 NOTE — Progress Notes (Signed)
LMTCB

## 2017-01-09 NOTE — Progress Notes (Signed)
Progress Note  Patient Name: Barry CUPP Sr. Date of Encounter: 01/09/2017  Primary Cardiologist: [name]  Subjective   No hx obtainable  Inpatient Medications    Scheduled Meds: . arformoterol  15 mcg Nebulization BID  . budesonide (PULMICORT) nebulizer solution  0.5 mg Nebulization BID  . chlorhexidine gluconate (MEDLINE KIT)  15 mL Mouth Rinse BID  . insulin aspart  0-15 Units Subcutaneous Q4H  . levothyroxine  56 mcg Intravenous Daily  . mouth rinse  15 mL Mouth Rinse 10 times per day  . pantoprazole (PROTONIX) IV  40 mg Intravenous QHS   Continuous Infusions: . sodium chloride 50 mL/hr at 01/02/2017 1629  . amiodarone 30 mg/hr (01/09/17 0914)  . fentaNYL infusion INTRAVENOUS 100 mcg/hr (01/09/17 0800)  . heparin Stopped (01/09/17 0830)  . phenylephrine (NEO-SYNEPHRINE) Adult infusion Stopped (01/09/17 0620)   PRN Meds: acetaminophen (TYLENOL) oral liquid 160 mg/5 mL, albuterol, bisacodyl, docusate, fentaNYL, midazolam   Vital Signs    Vitals:   01/09/17 0716 01/09/17 0747 01/09/17 0800 01/09/17 0900  BP: 102/61  (!) 107/59 117/61  Pulse: (!) 53  (!) 58 (!) 59  Resp: '18  18 18  '$ Temp:  (!) 96.1 F (35.6 C) (!) 96.1 F (35.6 C)   TempSrc:  Core (Comment) Core (Comment)   SpO2: 100%  100% 100%  Weight:      Height:        Intake/Output Summary (Last 24 hours) at 01/09/17 0929 Last data filed at 01/09/17 0900  Gross per 24 hour  Intake          2280.23 ml  Output              128 ml  Net          2152.23 ml   Filed Weights   12/29/2016 0800  Weight: 191 lb 13.2 oz (87 kg)    Telemetry    Regular wide complex rhythm, possibly junctional, HR 60 bpm, no recurrent VT/VF - Personally Reviewed   Physical Exam  Intubated, sedated, unresponsive GEN: No acute distress.   Neck: No JVD Cardiac: RRR, no murmurs, rubs, or gallops.  Respiratory: coarse rales bilaterally. GI: Soft, nontender, non-distended  MS: No edema; No deformity. Neuro:  Nonfocal  Psych:  Normal affect   Labs    Chemistry Recent Labs Lab 12/14/2016 0325  01/03/2017 0500 01/07/2017 1622 01/09/17 0547  NA 142  < > 141 140 143  K 4.4  < > 4.4 5.3* 5.1  CL 109  < > 110 112* 113*  CO2 21*  --  18* 16* 12*  GLUCOSE 134*  < > 112* 212* 85  BUN 27*  < > 30* 40* 47*  CREATININE 2.01*  < > 1.97* 2.36* 3.10*  CALCIUM 10.6*  --  9.9 8.9 8.4*  PROT 5.6*  --  6.1*  --   --   ALBUMIN 2.9*  --  3.2*  --   --   AST 421*  --  475*  --   --   ALT 348*  --  380*  --   --   ALKPHOS 75  --  77  --   --   BILITOT 0.6  --  0.7  --   --   GFRNONAA 29*  --  30* 24* 17*  GFRAA 34*  --  35* 28* 20*  ANIONGAP 12  --  13 12 18*  < > = values in this interval not displayed.  Hematology Recent Labs Lab 12/22/2016 0325 12/28/2016 0342 01/09/17 0330  WBC 17.5*  --  21.4*  RBC 4.30  --  4.45  HGB 11.8* 12.9* 12.7*  HCT 38.6* 38.0* 40.0  MCV 89.8  --  89.9  MCH 27.4  --  28.5  MCHC 30.6  --  31.8  RDW 15.5  --  16.3*  PLT 174  --  147*    Cardiac Enzymes Recent Labs Lab 12/14/2016 0854 12/26/2016 1622 01/06/2017 2313 01/09/17 0547  TROPONINI 19.67* 30.19* 34.42* 29.63*    Recent Labs Lab 12/25/2016 0340  TROPIPOC 0.39*     BNP Recent Labs Lab 12/15/2016 0326  BNP 340.9*     DDimer No results for input(s): DDIMER in the last 168 hours.   Radiology    Ct Head Wo Contrast  Result Date: 12/20/2016 CLINICAL DATA:  Altered mental status EXAM: CT HEAD WITHOUT CONTRAST TECHNIQUE: Contiguous axial images were obtained from the base of the skull through the vertex without intravenous contrast. COMPARISON:  Brain MRI 07/17/2016 FINDINGS: Brain: No mass lesion, intraparenchymal hemorrhage or extra-axial collection. No evidence of acute cortical infarct. There is periventricular hypoattenuation compatible with chronic microvascular disease. Bilateral lentiform nucleus prominent perivascular spaces. Vascular: Atherosclerotic calcification of the vertebral and internal carotid arteries at the  skull base. Skull: Normal visualized skull base, calvarium and extracranial soft tissues. Sinuses/Orbits: No sinus fluid levels or advanced mucosal thickening. No mastoid effusion. Normal orbits. IMPRESSION: Atrophy and advanced chronic ischemic microangiopathy without acute abnormality. Electronically Signed   By: Ulyses Jarred M.D.   On: 01/05/2017 04:55   Ct Chest Wo Contrast  Result Date: 12/27/2016 CLINICAL DATA:  Post cardiac arrest. EXAM: CT CHEST WITHOUT CONTRAST TECHNIQUE: Multidetector CT imaging of the chest was performed following the standard protocol without IV contrast. COMPARISON:  Radiograph earlier this day. High-resolution chest CT 2 days prior. FINDINGS: Cardiovascular: Mild cardiomegaly. Dense coronary artery calcifications, post CABG. Atherosclerosis of the thoracic aorta without aneurysm. No periaortic soft tissue stranding. No pericardial fluid. Mediastinum/Nodes: Small mediastinal nodes are unchanged from prior exam. No evidence of hilar adenopathy. Moderate hiatal hernia. Enteric tube in place with tip below the diaphragm in the stomach, side-port within a hiatal hernia. Endotracheal tube in place. Lungs/Pleura: Chronic right middle lobe volume loss is unchanged from recent comparison. Scattered lower lobe atelectasis. No pulmonary edema or pleural fluid. No new airspace consolidation. Scattered debris in the trachea and bilateral mainstem bronchi. Mild central bronchial wall thickening. Upper Abdomen: Gallstone incidentally noted. Pancreatic parenchymal atrophy incidentally noted. Liquid and solid stool in the visualized colon. Musculoskeletal: Nondisplaced fractures right anterior second through sixth ribs. Minimally displaced fractures of left lateral fourth and fifth ribs. Post median sternotomy. No fracture of the thoracic spine. IMPRESSION: 1. Scattered debris within the trachea and bilateral mainstem bronchi which may reflect secretions or aspiration. Mild dependent atelectasis.  2. Acute bilateral rib fractures, nondisplaced anteriorly second through sixth ribs on the right. Mildly displaced left lateral fourth and fifth rib fractures. This is likely sequela of CPR. 3. Coronary artery calcifications post CABG. Aortic atherosclerosis. Electronically Signed   By: Jeb Levering M.D.   On: 01/06/2017 05:11   Dg Chest Port 1 View  Result Date: 01/09/2017 CLINICAL DATA:  Respiratory failure. EXAM: PORTABLE CHEST 1 VIEW COMPARISON:  Radiographs of December 21, 2016. CT scan of January 08, 2017. FINDINGS: Stable cardiomediastinal silhouette. Endotracheal and nasogastric tubes are unchanged in position. Status post coronary artery bypass graft. Atherosclerosis of thoracic aorta is noted.  Mild left apical pneumothorax is noted. Stable bibasilar subsegmental atelectasis is noted. Probable minimal left pleural effusion is noted. Left rib fractures are noted. IMPRESSION: Stable support apparatus. Mild left apical pneumothorax is noted on the order approximately 10%. Critical Value/emergent results were called by telephone at the time of interpretation on 01/09/2017 at 9:24 am to Casilda Carls, the patient's nurse, who verbally acknowledged these results and will immediately contact the patient's physician. Electronically Signed   By: Marijo Conception, M.D.   On: 01/09/2017 09:25   Dg Chest Portable 1 View  Result Date: 01/05/2017 CLINICAL DATA:  83 year old male status post CPR and intubation. EXAM: PORTABLE CHEST 1 VIEW COMPARISON:  Chest CT dated 01/06/2017 FINDINGS: Endotracheal tube the tip approximately 3 cm above the carina. Enteric tube with tip and side-port above the diaphragm were likely in the hiatal hernia. Recommend advancing of the tube into the stomach. On Mild diffuse chronic interstitial coarsening. There is no focal consolidation, pleural effusion, or pneumothorax. There is mild enlargement of the cardiac silhouette. There is atherosclerotic calcification of the aortic arch.  The aorta is slightly tortuous. Median sternotomy wires and CABG vascular clips noted. Fractures of the left lateral fourth and fifth ribs IMPRESSION: 1. Endotracheal tube above the carina. 2. Enteric tube with tip and side-port likely in the hiatal hernia. Recommend further advancing the tube into the stomach. 3. No acute cardiopulmonary process. 4. Fractures of the left lateral fourth and fifth ribs. Electronically Signed   By: Anner Crete M.D.   On: 12/22/2016 03:52   Dg Abd Portable 1v  Result Date: 12/13/2016 CLINICAL DATA:  Orogastric tube placement. EXAM: PORTABLE ABDOMEN - 1 VIEW COMPARISON:  CT abdomen and pelvis December 02, 2013 FINDINGS: Initial image shows nasogastric tube tip in gastric cardia with side port at the gastroesophageal junction. A second image shows nasogastric tube tip and side-port in the proximal stomach. Bowel gas pattern is unremarkable without obstruction or free air. Lung bases are clear. IMPRESSION: On second submitted image, nasogastric tube tip and side port are in the proximal stomach. No bowel obstruction or free air evident. Electronically Signed   By: Lowella Grip III M.D.   On: 12/22/2016 09:08    Cardiac Studies   01/07/2017 Echo: Study Conclusions  - Left ventricle: The cavity size was normal. There was moderate   concentric hypertrophy. Systolic function was severely reduced.   The estimated ejection fraction was in the range of 25% to 30%.   Global hypokinesis with inferior akinesis. The study is not   technically sufficient to allow evaluation of LV diastolic   function. - Aortic valve: Calcified leaflets. Moderate stenosis and trivial   regurgitation. Consider low flow, low gradient aortic stenosis.   Mean gradient (S): 9 mm Hg. Peak gradient (S): 22 mm Hg. Valve   area (Vmax): 1.03 cm^2. Valve area (Vmean): 1.09 cm^2. - Mitral valve: Mildly thickened leaflets . There was moderate   regurgitation. - Left atrium: Severely dilated. -  Right ventricle: The cavity size was mildly dilated. Moderate   systolic dysfunction. - Right atrium: Moderately dilated. - Tricuspid valve: There was mild regurgitation. - Pulmonary arteries: PA peak pressure: 35 mm Hg (S) + RAP. - Systemic veins: The IVC was not visualized.  Impressions:  - LVEF 25-30%, moderate LVH, severe global hypokinesis with   inferior akinesis, probably moderate aortic stenosis with low   gradient, due to low cardiac output, moderate MR, severe LAE,   moderate RAE, mild TR, RVSP 35  mmHg + RAP, IVC not visualized.   Patient Profile     83 y.o. male with known CAD status post CABG 2017 presented with out of hospital cardiac arrest, initially PEA and then multiple shocks delivered for ventricular fibrillation, 45-minute time to [facility]    1. PEA then V. fib arrest: 45 minutes CPR to return of spontaneous circulation.  Hypothermia protocol.  2.  Non-STEMI: Peak troponin 34, now trending down.  Patient on IV heparin.  Recommend loaded with Plavix 300 mg today then 75 mg tomorrow  3.  Acute renal failure: Patient's creatinine has increased to 3.1 this morning.  Likely related to hypoperfusion after cardiac arrest and episode of hypotension yesterday  4.  Cardiogenic shock: LVEF 30% by echo.  Transiently required vasoactive agents yesterday, now with stable blood pressure on no pressor support  5.  Ventricular fibrillation: He remains on IV amiodarone.  We will transition to amiodarone 400 mg twice daily per tube and discontinue IV amiodarone  6. Anoxic encephalopathy: Discussed evaluation with patient's wife.  He start rewarming later today.  Suspect we will know a lot more about his neurologic status tomorrow.  The patient is critically ill with multiple organ systems failure and requires high complexity decision making for assessment and support, frequent evaluation and titration of therapies, application of advanced monitoring technologies  and extensive interpretation of multiple databases. Discussed plan with wife at length.   Critical Care Time devoted to patient care services described in this note is 40 minutes.   For questions or updates, please contact [facility] Please consult [email or web address] for contact info under Cardiology/STEMI.      Deatra James, MD  01/09/2017, 9:29 AM

## 2017-01-09 NOTE — Progress Notes (Signed)
CRITICAL VALUE ALERT  Critical Value:  APTT >200  Date & Time Notied:  01/09/2017  Provider Notified: Pilar Plate, Pharmacist  Orders Received/Actions taken: Heparin stopped temporarily- restarted at 0930 at 600 units

## 2017-01-09 NOTE — Progress Notes (Signed)
Initial Nutrition Assessment  INTERVENTION:   Vital AF 1.2 @ 60 ml/hr (1440 ml/day) Provides: 1728 kcal, 108 grams protein, and 1167 ml free water.    NUTRITION DIAGNOSIS:   Inadequate oral intake related to inability to eat as evidenced by NPO status.  GOAL:   Patient will meet greater than or equal to 90% of their needs  MONITOR:   Labs, TF tolerance  REASON FOR ASSESSMENT:   Consult, Ventilator Enteral/tube feeding initiation and management  ASSESSMENT:   Pt with PMH of HTN, DM, HLD, OSA, asthma, PAD, CAD s/p CABG '17, admitted with OOH arrest, 45 minute to ROSC. TTM at 36 degrees.    Spoke with RN at bedside, no family present No sedation or pressors currently  Per MD AKI from hypoperfusion after arrest.   Patient is currently intubated on ventilator support MV: 9.7 L/min Temp (24hrs), Avg:96.9 F (36.1 C), Min:95.2 F (35.1 C), Max:98.8 F (37.1 C)  Medications reviewed.  Labs reviewed: PO4 6.4 (H), Cr 3.1 (H)   NUTRITION - FOCUSED PHYSICAL EXAM:    Most Recent Value  Orbital Region  No depletion  Upper Arm Region  No depletion  Thoracic and Lumbar Region  Unable to assess  Buccal Region  Unable to assess  Temple Region  No depletion  Clavicle Bone Region  No depletion  Clavicle and Acromion Bone Region  No depletion  Scapular Bone Region  Unable to assess  Dorsal Hand  No depletion  Patellar Region  No depletion  Anterior Thigh Region  Unable to assess  Posterior Calf Region  No depletion  Edema (RD Assessment)  None  Hair  Reviewed  Eyes  Unable to assess  Mouth  Unable to assess  Skin  Reviewed  Nails  Reviewed       Diet Order:    NPO  EDUCATION NEEDS:   No education needs have been identified at this time  Skin:  Skin Assessment: Reviewed RN Assessment  Last BM:  10/31  Height:   Ht Readings from Last 1 Encounters:  12/28/2016 5\' 8"  (1.727 m)    Weight:   Wt Readings from Last 1 Encounters:  12/12/2016 191 lb 13.2 oz (87  kg)    Ideal Body Weight:  70 kg  BMI:  Body mass index is 29.17 kg/m.  Estimated Nutritional Needs:   Kcal:  1601  Protein:  105-125 grams  Fluid:  >1.7 L/day  Maylon Peppers RD, LDN, CNSC [phone] Pager [phone] After Hours Pager

## 2017-01-09 DEATH — deceased

## 2017-01-10 ENCOUNTER — Telehealth: Payer: Self-pay | Admitting: Internal Medicine

## 2017-01-10 ENCOUNTER — Inpatient Hospital Stay (HOSPITAL_COMMUNITY): Payer: Medicare Other

## 2017-01-10 LAB — GLUCOSE, CAPILLARY
GLUCOSE-CAPILLARY: 229 mg/dL — AB (ref 65–99)
Glucose-Capillary: 163 mg/dL — ABNORMAL HIGH (ref 65–99)
Glucose-Capillary: 189 mg/dL — ABNORMAL HIGH (ref 65–99)

## 2017-01-10 LAB — BLOOD GAS, ARTERIAL
Acid-base deficit: 6.8 mmol/L — ABNORMAL HIGH (ref 0.0–2.0)
Bicarbonate: 18.1 mmol/L — ABNORMAL LOW (ref 20.0–28.0)
Drawn by: 249101
FIO2: 40
MECHVT: 520 mL
O2 SAT: 96.9 %
PATIENT TEMPERATURE: 98.6
PCO2 ART: 36 mmHg (ref 32.0–48.0)
PEEP: 5 cmH2O
PH ART: 7.321 — AB (ref 7.350–7.450)
PO2 ART: 94.6 mmHg (ref 83.0–108.0)
RATE: 18 resp/min

## 2017-01-10 LAB — APTT: APTT: 52 s — AB (ref 24–36)

## 2017-01-10 LAB — PHOSPHORUS: PHOSPHORUS: 7.7 mg/dL — AB (ref 2.5–4.6)

## 2017-01-10 LAB — CBC
HCT: 35.8 % — ABNORMAL LOW (ref 39.0–52.0)
Hemoglobin: 11.6 g/dL — ABNORMAL LOW (ref 13.0–17.0)
MCH: 28.9 pg (ref 26.0–34.0)
MCHC: 32.4 g/dL (ref 30.0–36.0)
MCV: 89.3 fL (ref 78.0–100.0)
PLATELETS: 87 10*3/uL — AB (ref 150–400)
RBC: 4.01 MIL/uL — AB (ref 4.22–5.81)
RDW: 17.4 % — ABNORMAL HIGH (ref 11.5–15.5)
WBC: 17.9 10*3/uL — ABNORMAL HIGH (ref 4.0–10.5)

## 2017-01-10 LAB — COMPREHENSIVE METABOLIC PANEL
ALT: 161 U/L — ABNORMAL HIGH (ref 17–63)
ANION GAP: 16 — AB (ref 5–15)
AST: 116 U/L — ABNORMAL HIGH (ref 15–41)
Albumin: 2.4 g/dL — ABNORMAL LOW (ref 3.5–5.0)
Alkaline Phosphatase: 70 U/L (ref 38–126)
BUN: 63 mg/dL — ABNORMAL HIGH (ref 6–20)
CHLORIDE: 107 mmol/L (ref 101–111)
CO2: 18 mmol/L — AB (ref 22–32)
Calcium: 7.8 mg/dL — ABNORMAL LOW (ref 8.9–10.3)
Creatinine, Ser: 4.32 mg/dL — ABNORMAL HIGH (ref 0.61–1.24)
GFR calc non Af Amer: 12 mL/min — ABNORMAL LOW (ref >60)
GFR, EST AFRICAN AMERICAN: 14 mL/min — AB (ref >60)
Glucose, Bld: 204 mg/dL — ABNORMAL HIGH (ref 65–99)
Potassium: 5.5 mmol/L — ABNORMAL HIGH (ref 3.5–5.1)
SODIUM: 141 mmol/L (ref 135–145)
Total Bilirubin: 0.8 mg/dL (ref 0.3–1.2)
Total Protein: 5.1 g/dL — ABNORMAL LOW (ref 6.5–8.1)

## 2017-01-10 LAB — HEPARIN LEVEL (UNFRACTIONATED): Heparin Unfractionated: >2.2 IU/mL — ABNORMAL HIGH (ref 0.30–0.70)

## 2017-01-10 LAB — MAGNESIUM: Magnesium: 2.1 mg/dL (ref 1.7–2.4)

## 2017-01-10 MED ORDER — SODIUM CHLORIDE 0.9 % IV SOLN
10.0000 mg/h | INTRAVENOUS | Status: DC
  Filled 2017-01-10: qty 10

## 2017-01-10 MED ORDER — ACETAMINOPHEN 650 MG RE SUPP: 650.0000 mg | RECTAL | Status: DC | PRN

## 2017-01-10 MED ORDER — MIDAZOLAM BOLUS VIA INFUSION (WITHDRAWAL LIFE SUSTAINING TX)
5.0000 mg | INTRAVENOUS | Status: DC | PRN
  Filled 2017-01-10: qty 20

## 2017-01-10 MED ORDER — ATROPINE SULFATE 1 % OP SOLN
4.0000 [drp] | OPHTHALMIC | Status: DC | PRN
  Filled 2017-01-10: qty 2

## 2017-01-10 MED ORDER — FENTANYL 2500MCG IN NS 250ML (10MCG/ML) PREMIX INFUSION: 100.0000 ug/h | INTRAVENOUS | Status: DC

## 2017-01-10 MED ORDER — FENTANYL BOLUS VIA INFUSION
50.0000 ug | INTRAVENOUS | Status: DC | PRN
  Administered 2017-01-10: 100 ug via INTRAVENOUS
  Filled 2017-01-10: qty 200

## 2017-01-11 LAB — CULTURE, RESPIRATORY: CULTURE: NORMAL

## 2017-01-13 NOTE — Progress Notes (Signed)
Pt is deceased Spouse is aware of results per 11.2.18 phone note

## 2017-01-14 ENCOUNTER — Telehealth: Payer: Self-pay

## 2017-01-14 NOTE — Telephone Encounter (Signed)
On 01/20/17 I received a d/c from [facility] ([employer]). The patient is a patient of Doctor Sood. The d/c will be taken to Pulmonary Unit @ [facility] this am for signature.  On 01/20/2017 I received the death certificate back from Doctor McQuaid who signed the d/c for Doctor Sood. I got the d/c ready and called the funeral home to let them know the d/c is ready for pickup and also faxed a copy to the funeral home per the funeral home request.

## 2017-02-07 ENCOUNTER — Other Ambulatory Visit: Payer: Medicare Other

## 2017-02-08 NOTE — Progress Notes (Signed)
Progress Note  Patient Name: Barry RAO Sr. Date of Encounter: 01-31-2017  Primary Cardiologist: [name]  Subjective   Intubated and sedated, wife at bedside   Inpatient Medications    Scheduled Meds: . amiodarone  400 mg Per Tube BID  . arformoterol  15 mcg Nebulization BID  . budesonide (PULMICORT) nebulizer solution  0.5 mg Nebulization BID  . chlorhexidine gluconate (MEDLINE KIT)  15 mL Mouth Rinse BID  . clopidogrel  75 mg Oral Daily  . insulin aspart  0-15 Units Subcutaneous Q4H  . levothyroxine  100 mcg Per Tube QAC breakfast  . mouth rinse  15 mL Mouth Rinse 10 times per day  . pantoprazole sodium  40 mg Per Tube Q24H   Continuous Infusions: . sodium chloride 10 mL/hr at Jan 31, 2017 0146  . clindamycin (CLEOCIN) IV Stopped (2017/01/31 0242)  . feeding supplement (VITAL AF 1.2 CAL) 1,000 mL (01/09/17 2000)  . fentaNYL infusion INTRAVENOUS 125 mcg/hr (January 31, 2017 0454)  . heparin 1,000 Units/hr (01/31/17 0654)  . phenylephrine (NEO-SYNEPHRINE) Adult infusion Stopped (01/09/17 5621)  .  sodium bicarbonate  infusion 1000 mL 75 mL/hr at 01/09/17 2139   PRN Meds:.acetaminophen (TYLENOL) oral liquid 160 mg/5 mL, albuterol, bisacodyl, docusate, fentaNYL, midazolam   Vital Signs    Vitals:   01-31-17 0600 01/31/2017 0700 January 31, 2017 0722 01/31/2017 0800  BP: (!) 103/58 (!) 100/58 (!) 100/58 (!) 115/57  Pulse: 77 74 73 75  Resp: 18 18 (!) 24 (!) 22  Temp: (!) 97.3 F (36.3 C)  98.4 F (36.9 C)   TempSrc: Core (Comment)  Core (Comment)   SpO2: 100% 100% 100% 100%  Weight:      Height:        Intake/Output Summary (Last 24 hours) at 01-31-2017 0808 Last data filed at 01-31-2017 0700  Gross per 24 hour  Intake          3854.92 ml  Output               85 ml  Net          3769.92 ml   Filed Weights   12/26/2016 0800 January 31, 2017 0500  Weight: 191 lb 13.2 oz (87 kg) 196 lb 3.4 oz (89 kg)   Telemetry    Regular wide complex rhythm, rate 70 bpm, no VT/VF - Personally  Reviewed   Physical Exam   GEN: Intubated, Sedated, no acute distress.   Neck: No JVD  Cardiac: RRR, no murmurs, rubs, or gallops.  Respiratory: bilateral course breath sounds GI: Soft, nontender, non-distended  MS: trace peripheral edema  Neuro:  Nonfocal  Psych: sedated  Radiology    Dg Chest Port 1 View  Result Date: 01-31-2017 CLINICAL DATA:  Respiratory failure. EXAM: PORTABLE CHEST 1 VIEW COMPARISON:  Chest x-ray from yesterday. FINDINGS: Prior CABG. Endotracheal and enteric tubes are stable in position. Unchanged cardiomegaly and pulmonary vascular congestion. Stable small left pneumothorax, trace pleural effusion, and basilar atelectasis. Unchanged left-sided rib fractures. IMPRESSION: Stable small left pneumothorax.  Stable support tubes. Electronically Signed   By: Titus Dubin M.D.   On: January 31, 2017 08:04   Dg Chest Port 1 View  Result Date: 01/09/2017 CLINICAL DATA:  83 y/o  M; follow-up of pneumothorax. EXAM: PORTABLE CHEST 1 VIEW COMPARISON:  01/09/2017 chest radiograph. FINDINGS: Stable cardiac silhouette. Post median sternotomy with wires in alignment. Endotracheal tube 3.1 cm from carina. Enteric tube tip below the field of view in the abdomen. Aortic atherosclerosis with calcification. No focal consolidation. Stable  small left pneumothorax. Stable left-sided rib fractures. IMPRESSION: Stable small left pneumothorax. Stable position of endotracheal and enteric tubes. Electronically Signed   By: Kristine Garbe M.D.   On: 01/09/2017 22:34   Dg Chest Port 1 View  Result Date: 01/09/2017 CLINICAL DATA:  History of left pneumothorax, followup EXAM: PORTABLE CHEST 1 VIEW COMPARISON:  Portable chest x-ray of 01/09/2017 and CT chest of 12/13/2016 FINDINGS: The small left apical pneumothorax is unchanged and multiple left rib fractures are again noted. Opacity at the left lung base most likely represents atelectasis. The right lung is clear. Cardiomegaly is stable. The  tip of the endotracheal tube is approximately 3.5 cm above the carina. NG tube extends below the hemidiaphragm. IMPRESSION: 1. Stable small left apical hydropneumothorax, and left rib fractures. 2. Endotracheal tube tip 3.5 cm above the carina. 3. Bibasilar atelectasis left-greater-than-right. Stable cardiomegaly. Electronically Signed   By: Ivar Drape M.D.   On: 01/09/2017 10:18   Dg Chest Port 1 View  Result Date: 01/09/2017 CLINICAL DATA:  Respiratory failure. EXAM: PORTABLE CHEST 1 VIEW COMPARISON:  Radiographs of December 21, 2016. CT scan of January 08, 2017. FINDINGS: Stable cardiomediastinal silhouette. Endotracheal and nasogastric tubes are unchanged in position. Status post coronary artery bypass graft. Atherosclerosis of thoracic aorta is noted. Mild left apical pneumothorax is noted. Stable bibasilar subsegmental atelectasis is noted. Probable minimal left pleural effusion is noted. Left rib fractures are noted. IMPRESSION: Stable support apparatus. Mild left apical pneumothorax is noted on the order approximately 10%. Critical Value/emergent results were called by telephone at the time of interpretation on 01/09/2017 at 9:24 am to Casilda Carls, the patient's nurse, who verbally acknowledged these results and will immediately contact the patient's physician. Electronically Signed   By: Marijo Conception, M.D.   On: 01/09/2017 09:25   Dg Abd Portable 1v  Result Date: 12/17/2016 CLINICAL DATA:  Orogastric tube placement. EXAM: PORTABLE ABDOMEN - 1 VIEW COMPARISON:  CT abdomen and pelvis December 02, 2013 FINDINGS: Initial image shows nasogastric tube tip in gastric cardia with side port at the gastroesophageal junction. A second image shows nasogastric tube tip and side-port in the proximal stomach. Bowel gas pattern is unremarkable without obstruction or free air. Lung bases are clear. IMPRESSION: On second submitted image, nasogastric tube tip and side port are in the proximal stomach. No bowel  obstruction or free air evident. Electronically Signed   By: Lowella Grip III M.D.   On: 01/06/2017 09:08    Cardiac Studies   12/28/2016 Echo: Study Conclusions  - Left ventricle: The cavity size was normal. There was moderate concentric hypertrophy. Systolic function was severely reduced. The estimated ejection fraction was in the range of 25% to 30%. Global hypokinesis with inferior akinesis. The study is not technically sufficient to allow evaluation of LV diastolic function. - Aortic valve: Calcified leaflets. Moderate stenosis and trivial regurgitation. Consider low flow, low gradient aortic stenosis. Mean gradient (S): 9 mm Hg. Peak gradient (S): 22 mm Hg. Valve area (Vmax): 1.03 cm^2. Valve area (Vmean): 1.09 cm^2. - Mitral valve: Mildly thickened leaflets . There was moderate regurgitation. - Left atrium: Severely dilated. - Right ventricle: The cavity size was mildly dilated. Moderate systolic dysfunction. - Right atrium: Moderately dilated. - Tricuspid valve: There was mild regurgitation. - Pulmonary arteries: PA peak pressure: 35 mm Hg (S) + RAP. - Systemic veins: The IVC was not visualized.  Impressions:  - LVEF 25-30%, moderate LVH, severe global hypokinesis with inferior akinesis, probably moderate aortic  stenosis with low gradient, due to low cardiac output, moderate MR, severe LAE, moderate RAE, mild TR, RVSP 35 mmHg + RAP, IVC not visualized.  Patient Profile     83 y.o. Male with known CAD s/p CABG 2017 resented with out of hospital cardiac arrest, initially PEA then multiple shocks delivered for ventricular fibrillation, 45- minute time to ROSC.   Assessment & Plan    PEA then V.fib arrest: Hypothermia protocol   Non- STEMI - peak troponin 34, IV heparin held temporarily overnight for elevated aPTT, Plavix initiated yesterday   Acute renal failure 2/2 hypoperfusion post cardiac arrest and hypotension ( b/l crt 1.1):  Patients creatinine increased to 4.3 this morning with increased edema on chest xray this morning and decreased urine output (100 cc in the past 24 hours)   Hyperkalemia: Potassium 5.5 this morning   Cardiogenic shock (LVEF 30% by echo): blood pressure stable this morning without pressors   Ventricular fibrillation: No v.fib evident on telemetry overnight, transitioned from amiodarone gtt to 400 mg BID po yesterday   Anoxic encephalopathy: Rewarming initiated yesterday, no change today   For questions or updates, please contact [facility] Please consult [email or web address] for contact info under Cardiology/STEMI.      Signed, Ledell Noss, PGY2   Patient seen, examined. Available data reviewed. Agree with findings, assessment, and plan as outlined by Dr Hetty Ely.  The patient remains unresponsive.  JVP is elevated.  The lungs are rhonchorous throughout.  Heart sounds are regular and distant.  Abdomen is soft.  Extremities show no leg edema.  Neurologic: Patient does not respond to sternal rub other than blinking.  I have personally reviewed his imaging studies and lab data.  He has progressive oliguric renal failure.  Now with multiorgan failure and continued neurologic unresponsiveness after rewarming.  I have discussed his situation with his wife who is at the bedside and his son over the telephone.  His wishes would be that he not continue on prolonged life support.  It is clear that his clinical situation is deteriorating now with multiorgan failure.  After discussion the patient's wife wishes for extubation and comfort care. Support provided.   Sherren Mocha, M.D. 2017/01/19 12:03 PM

## 2017-02-08 NOTE — Telephone Encounter (Signed)
HRCT reviewed - clearly with minimal  findings not related to his passing Will be available to his wife as needed to answer any questions she may have

## 2017-02-08 NOTE — Progress Notes (Signed)
PULMONARY / CRITICAL CARE MEDICINE   Name: Barry LEWELLEN Sr. MRN: [id number] DOB: 1932-03-22    ADMISSION DATE:  12/10/2016 CONSULTATION DATE:  12/19/2016  REFERRING MD:  Dr. Wyvonnia Dusky  CHIEF COMPLAINT:  Cardiac Arrest  HISTORY OF PRESENT ILLNESS:   83 yo male woke up with a gasp and gurgling.  EMS called and pt found to be in PEA and then VF.  ROSC after about 60 minutes.  Found to have wide QRS. PMHx of HTN, DM, HLD, OSA, Asthma, PAD, CAD s/p CABG  SUBJECTIVE:  Poor urine outpt.  Remains on full vent support.  VITAL SIGNS: BP (!) 113/56   Pulse 77   Temp 98.2 F (36.8 C) (Core (Comment))   Resp 19   Ht 5\' 8"  (1.727 m)   Wt 196 lb 3.4 oz (89 kg)   SpO2 100%   BMI 29.83 kg/m   VENTILATOR SETTINGS: Vent Mode: PRVC FiO2 (%):  [40 %] 40 % Set Rate:  [18 bmp] 18 bmp Vt Set:  [520 mL] 520 mL PEEP:  [5 cmH20] 5 cmH20 Plateau Pressure:  [17 cmH20-24 cmH20] 24 cmH20  INTAKE / OUTPUT: I/O last 3 completed shifts: In: 4837.9 [I.V.:3125.6; Other:40; NG/GT:1522.3; IV Piggyback:150] Out: 140 [Urine:140]  PHYSICAL EXAMINATION:  General - on vent Eyes - pupils mid point ENT - ETT in place Cardiac - irregular, 2/6 murmur Chest - b/l rales Abd - soft, non tender Ext - no edema Skin - pressure ulcer on heels Neuro - opens eyes with stimulation, not following commands   LABS:  BMET  Recent Labs Lab 12/31/2016 1622 01/09/17 0547 February 02, 2017 0438  NA 140 143 141  K 5.3* 5.1 5.5*  CL 112* 113* 107  CO2 16* 12* 18*  BUN 40* 47* 63*  CREATININE 2.36* 3.10* 4.32*  GLUCOSE 212* 85 204*    Electrolytes  Recent Labs Lab 12/10/2016 1622 01/09/17 0547 01/09/17 1011 01/09/17 1858 02-02-2017 0438  CALCIUM 8.9 8.4*  --   --  7.8*  MG  --   --  2.3 2.2 2.1  PHOS  --   --  6.4* 7.3* 7.7*    CBC  Recent Labs Lab 01/04/2017 0325 12/16/2016 0342 01/09/17 0330 2017/02/02 0438  WBC 17.5*  --  21.4* 17.9*  HGB 11.8* 12.9* 12.7* 11.6*  HCT 38.6* 38.0* 40.0 35.8*  PLT 174  --   147* 87*    Coag's  Recent Labs Lab 12/14/2016 0325  12/22/2016 0854 12/10/2016 1622  01/09/17 0547 01/09/17 1858 Feb 02, 2017 0438  APTT  --   < > 27 122*  < > >200* 59* 52*  INR 1.34  --  1.33 1.47  --   --   --   --   < > = values in this interval not displayed.  Sepsis Markers  Recent Labs Lab 01/06/2017 0500 12/18/2016 0556 01/07/2017 0607 12/21/2016 0846  LATICACIDVEN  --  2.6* 2.41* 4.1*  PROCALCITON <0.10  --   --   --     ABG  Recent Labs Lab 12/16/2016 0502 01/09/17 0530 February 02, 2017 0549  PHART 7.339* 7.253* 7.321*  PCO2ART 46.2 30.3* 36.0  PO2ART 536.0* 129* 94.6    Liver Enzymes  Recent Labs Lab 01/05/2017 0325 12/31/2016 0500 2017-02-02 0438  AST 421* 475* 116*  ALT 348* 380* 161*  ALKPHOS 75 77 70  BILITOT 0.6 0.7 0.8  ALBUMIN 2.9* 3.2* 2.4*    Cardiac Enzymes  Recent Labs Lab 12/31/2016 1622 01/04/2017 2313 01/09/17 0547  TROPONINI 30.19*  34.42* 29.63*    Glucose  Recent Labs Lab 01/09/17 1221 01/09/17 1553 01/09/17 1946 2017-01-11 0019 Jan 11, 2017 0341 01-11-17 0753  GLUCAP 135* 169* 173* 163* 229* 189*    Imaging Dg Chest Port 1 View  Result Date: 2017/01/11 CLINICAL DATA:  Respiratory failure. EXAM: PORTABLE CHEST 1 VIEW COMPARISON:  Chest x-ray from yesterday. FINDINGS: Prior CABG. Endotracheal and enteric tubes are stable in position. Unchanged cardiomegaly and pulmonary vascular congestion. Stable small left pneumothorax, trace pleural effusion, and basilar atelectasis. Unchanged left-sided rib fractures. IMPRESSION: Stable small left pneumothorax.  Stable support tubes. Electronically Signed   By: Titus Dubin M.D.   On: January 11, 2017 08:04   Dg Chest Port 1 View  Result Date: 01/09/2017 CLINICAL DATA:  83 y/o  M; follow-up of pneumothorax. EXAM: PORTABLE CHEST 1 VIEW COMPARISON:  01/09/2017 chest radiograph. FINDINGS: Stable cardiac silhouette. Post median sternotomy with wires in alignment. Endotracheal tube 3.1 cm from carina. Enteric tube tip  below the field of view in the abdomen. Aortic atherosclerosis with calcification. No focal consolidation. Stable small left pneumothorax. Stable left-sided rib fractures. IMPRESSION: Stable small left pneumothorax. Stable position of endotracheal and enteric tubes. Electronically Signed   By: Kristine Garbe M.D.   On: 01/09/2017 22:34   Dg Chest Port 1 View  Result Date: 01/09/2017 CLINICAL DATA:  History of left pneumothorax, followup EXAM: PORTABLE CHEST 1 VIEW COMPARISON:  Portable chest x-ray of 01/09/2017 and CT chest of 12/27/2016 FINDINGS: The small left apical pneumothorax is unchanged and multiple left rib fractures are again noted. Opacity at the left lung base most likely represents atelectasis. The right lung is clear. Cardiomegaly is stable. The tip of the endotracheal tube is approximately 3.5 cm above the carina. NG tube extends below the hemidiaphragm. IMPRESSION: 1. Stable small left apical hydropneumothorax, and left rib fractures. 2. Endotracheal tube tip 3.5 cm above the carina. 3. Bibasilar atelectasis left-greater-than-right. Stable cardiomegaly. Electronically Signed   By: Ivar Drape M.D.   On: 01/09/2017 10:18     STUDIES:  CT head 10/31 >> atrophy and chronic ischemic microangiopathy CT chest 10/31 >> mod HH, chronic RML volume loss, debris in airways, gallstone, non displaced Rt anterior 2nd to 6th rib fx, minimally displaced Lt lateral 4th to 5th rib fx EEG 10/31 >> diffuse background suppression Echo 10/31 >> EF 25 to 30%, mod AS, mod MR  CULTURES: Sputum 11/01 >>  ANTIBIOTICS: Clindamycin 11/01 >>   SIGNIFICANT EVENTS: 10/31 Admit, TTM 36 degrees  LINES/TUBES: 10/31 ETT >>  DISCUSSION: 83 yo with cardiac arrest with ROSC after 60 minutes, VDRF, AKI, aspiration pneumonia, Rib fx with Lt PTX.  ASSESSMENT / PLAN:  PEA leading to VF cardiac arrest. CAD s/p CABG, HTN, HLD, A flutter. Acute systolic CHF. - continue amiodarone, heparin gtt  Acute  hypoxic respiratory failure. Rib fx's with Lt PTX. Hx of asthma. - full vent support - f/u CXR >> defer chest tube placement - BDs  Aspiration pneumonia. - hx of PCN allergy - day 2 of clindamycin  Acute renal failure with ATN. Hyperkalemia. Metabolic acidosis. - continue HCO3 gtt - f/u BMET - monitor renal function - explained to pt's wife that he is not a candidate for renal replacement given his medical status  Elevated LFTs 2nd to shock >> improving. Hx of GERD with hiatal hernia. - f/u LFTs intermittently  DM type II. Hx of hypothyroidism. - SSI - continue synthroid  Acute metabolic encephalopathy with anoxic encephalopathy. - monitor mental status  DVT  prophylaxis - heparin gtt SUP - protonix Nutrition - tube feeds Goals of care - DNR  Had extensive d/w pt's wife.  Explained that main concern relates to lack of improvement in neuro status and progressive renal failure.  She agrees that he should not be started on dialysis.  If he does not show improvement in neuro status over next 24 to 48 hours, then she would likely opt to transition to comfort care.  CC time 37 minutes  Chesley Mires, MD [facility] Pulmonary/Critical Care 01/26/17, 9:08 AM Pager:  [phone] After 3pm call: [phone]

## 2017-02-08 NOTE — Progress Notes (Signed)
ANTICOAGULATION CONSULT NOTE - Follow Up Consult  Pharmacy Consult for heparin Indication: NSTEMI and Aflutter  Labs:  Recent Labs  12/23/2016 0325 12/31/2016 0342  12/10/2016 0854  12/18/2016 1622 12/09/2016 2313 01/09/17 0330 01/09/17 0547 01/09/17 1858 01-21-2017 0438  HGB 11.8* 12.9*  --   --   --   --   --  12.7*  --   --  11.6*  HCT 38.6* 38.0*  --   --   --   --   --  40.0  --   --  35.8*  PLT 174  --   --   --   --   --   --  147*  --   --  87*  APTT  --   --   < > 27  --  122*  --  155* >200* 59* 52*  LABPROT 16.5*  --   --  16.4*  --  17.7*  --   --   --   --   --   INR 1.34  --   --  1.33  --  1.47  --   --   --   --   --   HEPARINUNFRC  --   --   --   --   < > >2.20*  --  >2.20* >2.20* >2.20* >2.20*  CREATININE 2.01* 1.90*  < >  --   --  2.36*  --   --  3.10*  --  4.32*  TROPONINI 0.43*  --   --  19.67*  --  30.19* 34.42*  --  29.63*  --   --   < > = values in this interval not displayed.   Assessment: 83yo male now w/ lower PTT despite rate increase last pm; no gtt issues, pt is fully rewarmed and likely needing much higher rates.  Goal of Therapy:  aPTT 66-102 seconds   Plan:  Will increase heparin gtt by 4 units/kg/hr to 1000 units/hr and check PTTin 8hr.  Wynona Neat, PharmD, BCPS  2017-01-21,6:55 AM

## 2017-02-08 NOTE — Death Summary Note (Signed)
Cable Fearn Calligan Sr. was a 83 y.o. admitted on 12/12/2016 with cardiac arrest.  His family heard him wake up with a gurgling and gasp.  EMS arrived and found he was in PEA and then Ventricular fibrillation.  He had 60 minutes of CPR before ROSC.  He was found to have a wide complex QRS.  He was intubated and started on target temperature management.  He was started on antibiotics for aspiration pneumonia.  He was found to have small left pneumothorax, but didn't need chest tube.  He had Echo which showed systolic CHF and valvular heart disease.  He did not have improvement in mental status.  Family opted for DNR status and transition to comfort care.  He was extubated on January 12, 2017 and expired at 41 AM.  Final diagnoses: Coronary artery disease PEA and Ventricular fibrillation with cardiac arrest Acute hypoxic respiratory failure Aspiration pneumonia Acute systolic CHF Aortic stenosis, moderate Mitral regurgitation, moderate History of atrial flutter History of hypertension Left pneumothorax after CPR Non displaced right rib fractures 2nd to 6th ribs Displaced left rib fractures 4th to 5th ribs Acute renal failure with ATN Hyperkalemia Metabolic acidosis with lactic acidosis Elevated liver enzymes from hypotension Hx of GERD with hiatal hernia Anoxic encephalopathy Acute metabolic encephalopathy DM type II Hx of hypothyroidism   Chesley Mires, MD [place] 01/16/2017, 10:18 AM

## 2017-02-08 NOTE — Progress Notes (Signed)
San Jetty RN and Berniece Salines RN wasted ~200c of fentanyl drip in the sink.

## 2017-02-08 NOTE — Procedures (Signed)
Extubation Procedure Note  Patient Details:   Name: Barry KIERNAN Sr. DOB: 1932-10-11 MRN: [id number]   Airway Documentation:     Evaluation  O2 sats: stable throughout Complications: No apparent complications Patient did tolerate procedure well. Bilateral Breath Sounds: Rhonchi   No   Patient extubated per withdrawal of life care order. Chaplain and RN at bedside with wife.   Keena Heesch N Madie Cahn Jan 13, 2017, 11:38 AM

## 2017-02-08 NOTE — Telephone Encounter (Signed)
Will respect wife's decision of not calling back. Will place CT results in the mail for her.   Will route to MW so he is aware of his passing.

## 2017-02-08 NOTE — Progress Notes (Signed)
Pt's wife approached RN and verbalized readiness to change patient to comfort care. Dr. Burt Knack just at bedside and already aware. Dr. Halford Chessman paged to make aware, and to place comfort measure orders. RRT informed.

## 2017-02-08 NOTE — Progress Notes (Signed)
Patient passed. Wife on-site in emotional distress with no family support. Chaplain provided card for placement , reflective listening, emotional support and compassionate presence.  Chaplain Ariv Penrod,

## 2017-02-08 NOTE — Progress Notes (Signed)
Pt extubated at ~1135 by RRT per comfort measure orders. This RN, pt's wife, and chaplain at the bedside. Tele monitor showed asystole at 1139. San Jetty RN and Penny Pia RN listened for one minute for heart tones. None were auscultated. TOD 1139.

## 2017-02-08 NOTE — Progress Notes (Signed)
Midazolam 50mg  in 23ml normal saline wasted in sink on 01/11/17 - 1230pm.  Witnessed by [employer]

## 2017-02-08 DEATH — deceased

## 2017-02-11 ENCOUNTER — Ambulatory Visit: Payer: Medicare Other | Admitting: Endocrinology

## 2017-04-02 ENCOUNTER — Ambulatory Visit: Payer: Medicare Other | Admitting: Internal Medicine

## 2017-04-03 IMAGING — CR DG CHEST 2V
2 series · 2 of 2 positions shown · non-contrast
Comparison: PA and lateral chest 12/05/2015 and 10/12/2013.

CLINICAL DATA: Status post CABG 11/29/2015.  Follow-up examination.

EXAM:
CHEST  2 VIEW

[w chest pa]
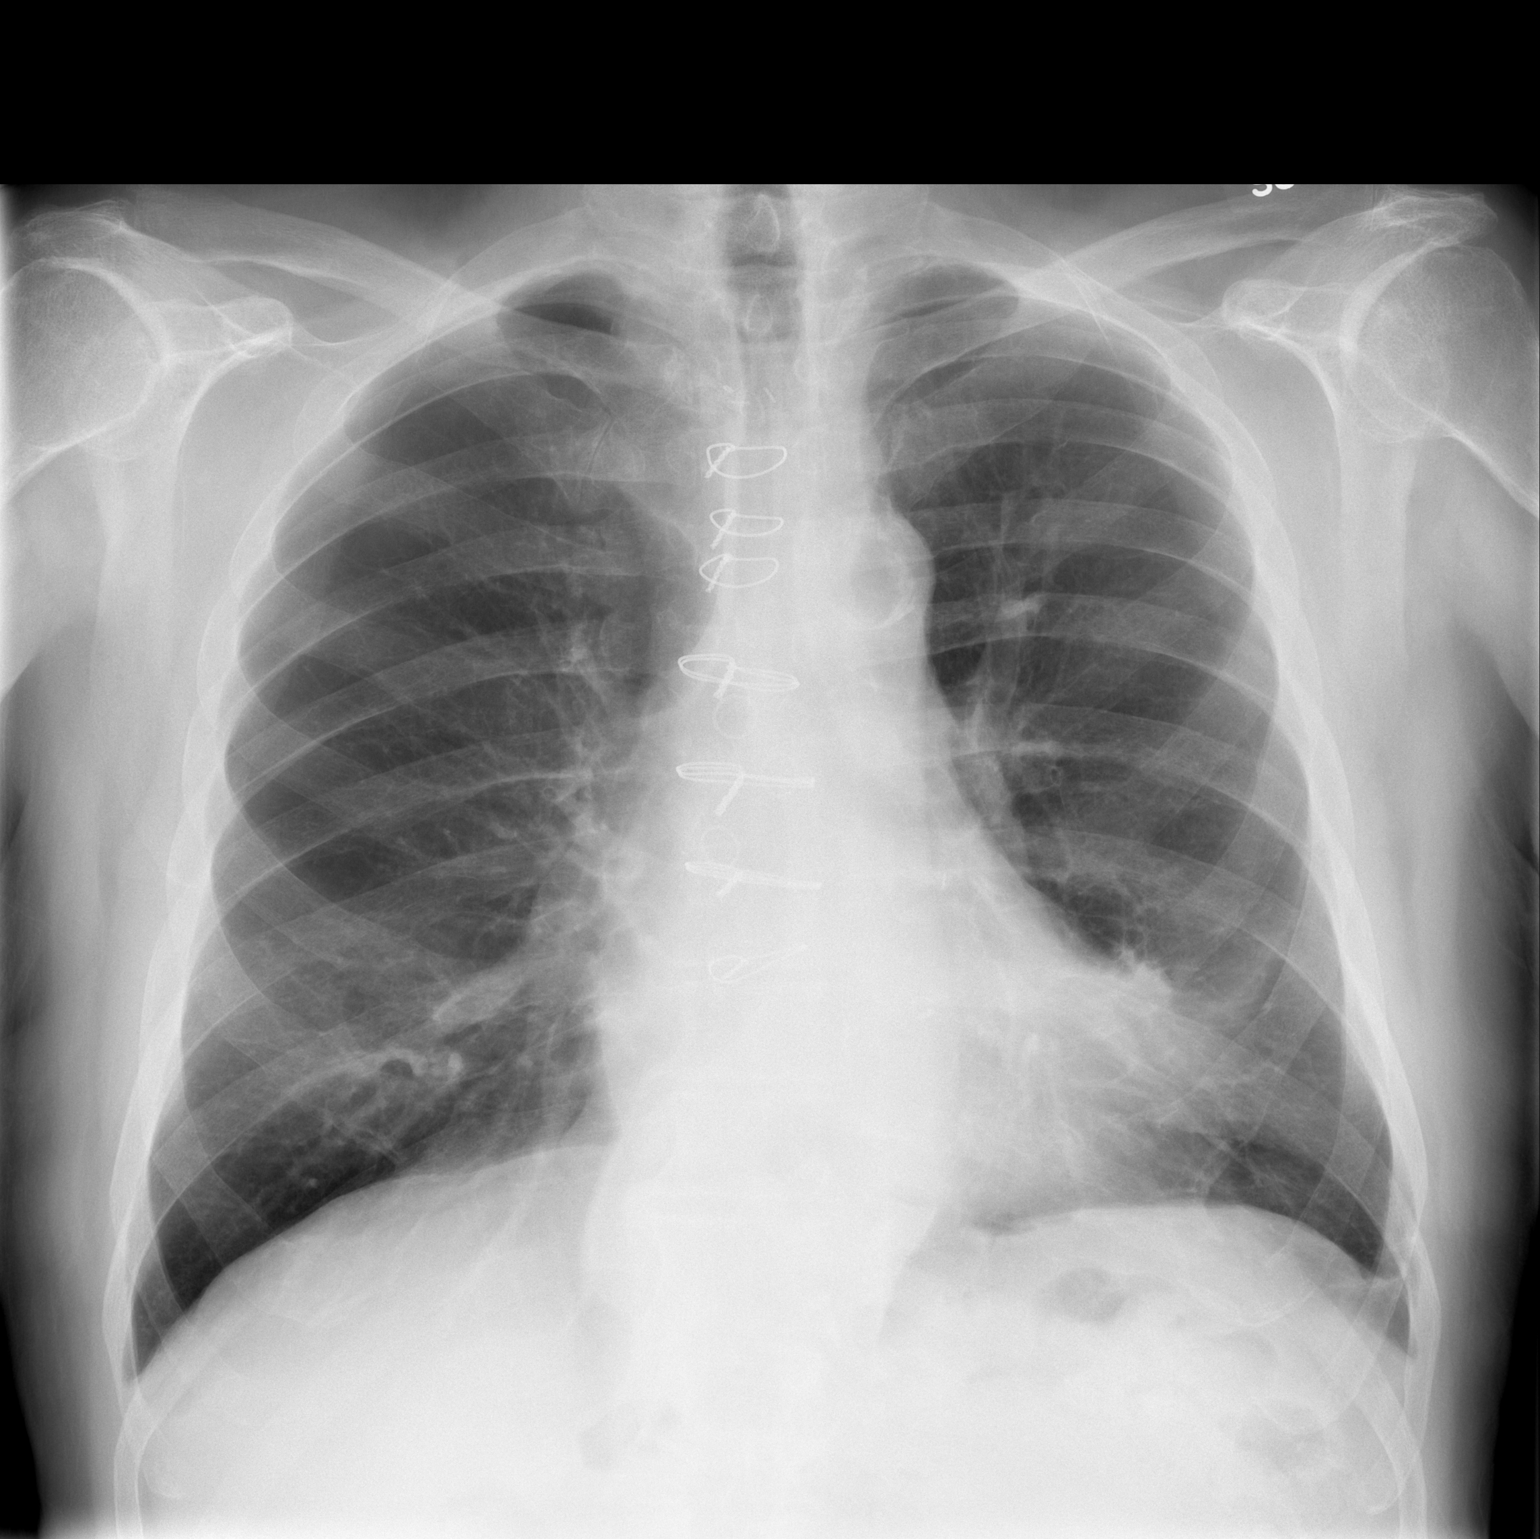

[w chest lat]
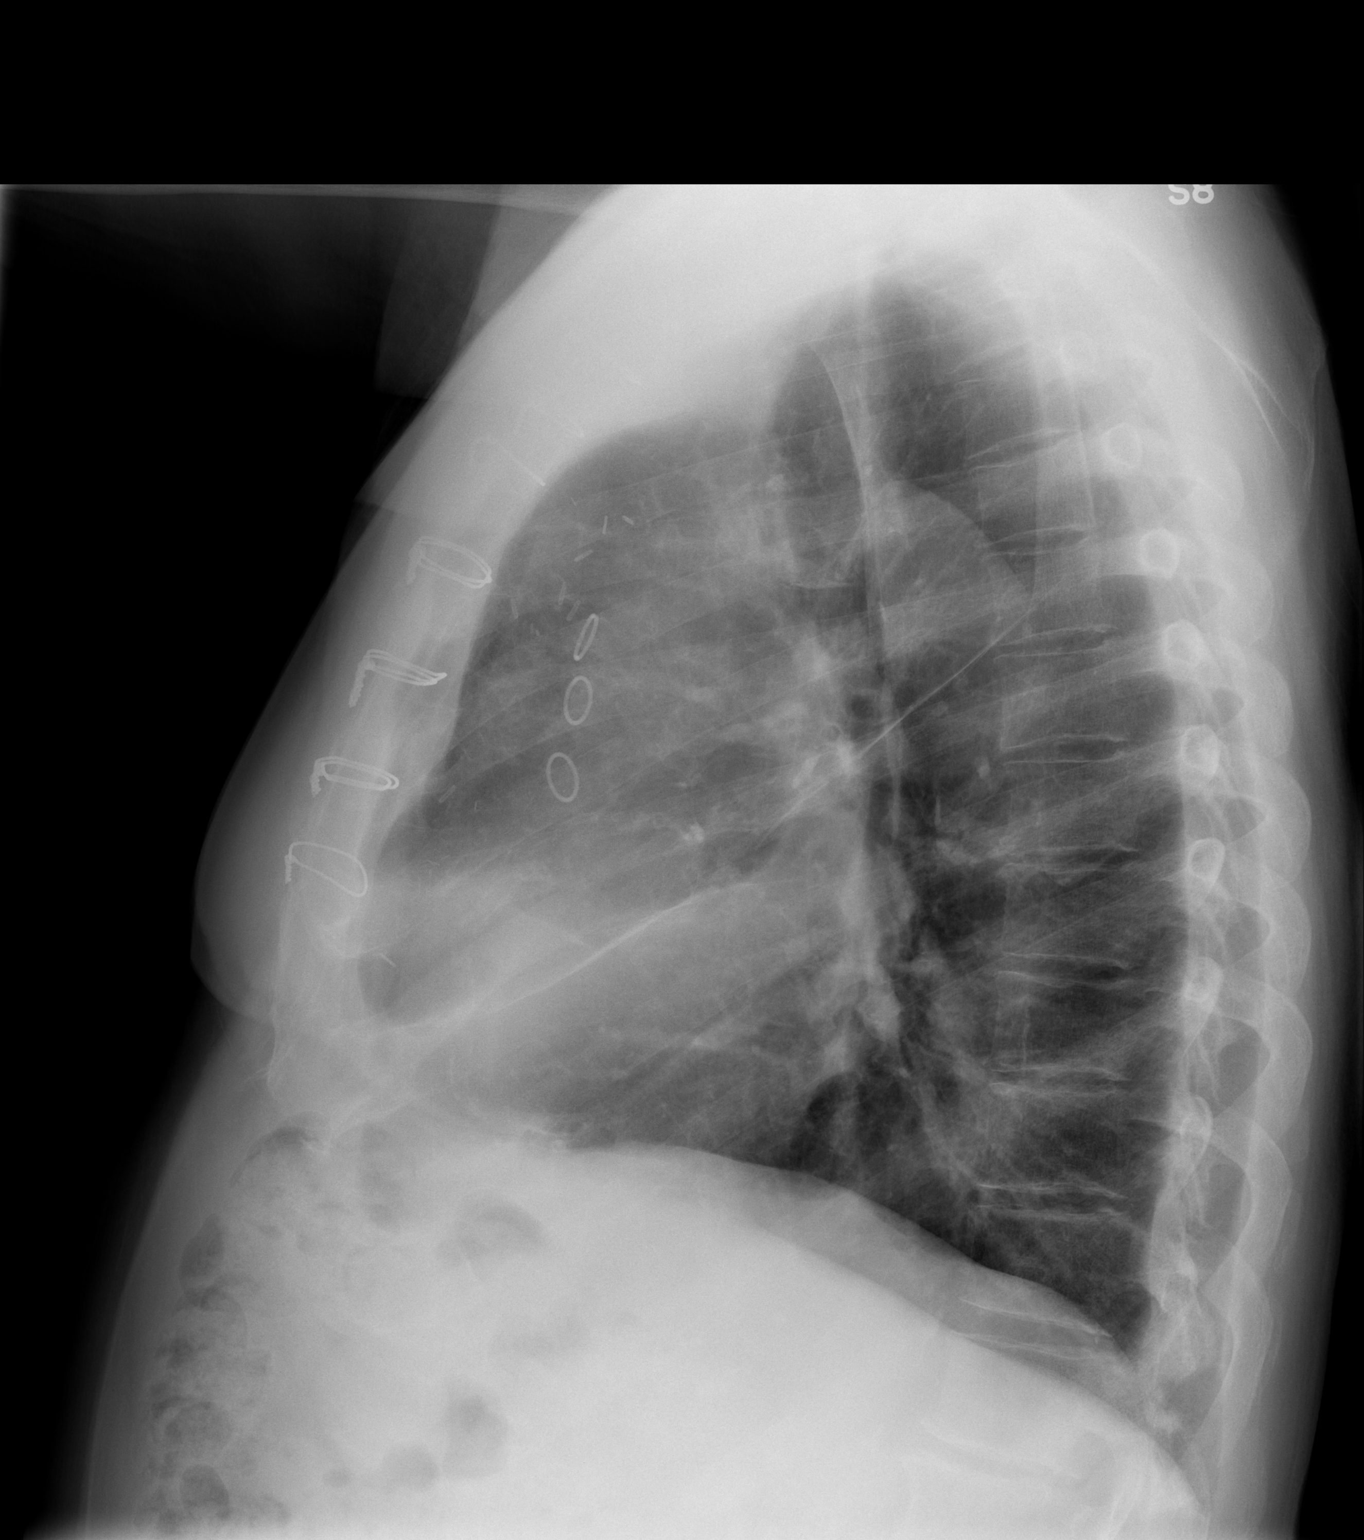

[2 of 2 positions shown; findings below may reference images not displayed]

FINDINGS: 7 intact median sternotomy wires are unchanged in appearance. Heart
size is normal. Small bilateral pleural effusions seen on most
recent examination have resolved. There is no pulmonary edema.
Aortic atherosclerosis is noted.
IMPRESSION: Resolve small bilateral pleural effusions.  No acute disease.

Atherosclerosis.

## 2017-05-16 ENCOUNTER — Telehealth: Payer: Self-pay | Admitting: Internal Medicine

## 2017-05-16 NOTE — Telephone Encounter (Signed)
Spoke with pt, states that she has questions about her spouse's death, and would like to speak to VS further as he was the provider that saw to pt in the hospital.  Pt is available at [phone].  VS please advise after you've spoken to pt.  Thanks!

## 2017-05-20 NOTE — Telephone Encounter (Signed)
I believe this message should have went to [facility].

## 2017-05-20 NOTE — Telephone Encounter (Signed)
Patient is calling once again and is anxious to speak with someone, [phone].

## 2017-05-26 NOTE — Telephone Encounter (Signed)
Spoke with the pt's spouse  She states that Dr. Halford Chessman had indicated that pt had a broken rib and this could have been a major factor in his death She states he did have a fall at home on the front porch that he told her sister about after the fact  She wants to know if this would help her in filing an accidental death claim  Please advise thanks!

## 2017-05-26 NOTE — Telephone Encounter (Signed)
Patient was one of MW here in the office but was seen by VS in the hospital. Patients wife would like to speak to VS about patients death, VS can you contact the wife, please let us know if you would like [place] to do anything, thanks.

## 2017-05-26 NOTE — Telephone Encounter (Signed)
Can you ask his wife for a list of specific questions she would like addressed so that I can review his medical record and provide her with the most detailed information possible.  After I review her questions, then I can give her a call.

## 2017-05-27 NOTE — Telephone Encounter (Signed)
I reviewed the record with Mrs. Odden.  Explained that he had CT chest from 01/06/17 that did not show evidence for rib fracture.  Explained that his CT chest from 01/08/18 after he was admitted with cardiac arrest showed b/l rib fracture, and this was most likely related to having CPR.  Explained that it would be unlikely that he had an antecedent unilateral rib fracture that triggered cardiac arrest, especially since there wasn't any evidence of pneumothorax or hemothorax.  Mrs. Woelfel expressed appreciation for my time, and did not have any additional questions.

## 2017-05-28 ENCOUNTER — Ambulatory Visit: Payer: Medicare Other | Admitting: Neurology

## 2018-04-09 IMAGING — DX DG CHEST 1V PORT
1 series · 1 of 1 positions shown · non-contrast
Comparison: Portable chest x-ray of 01/09/[address] chest of
01/08/2017

CLINICAL DATA: History of left pneumothorax, followup

EXAM:
PORTABLE CHEST 1 VIEW

[chest ap]
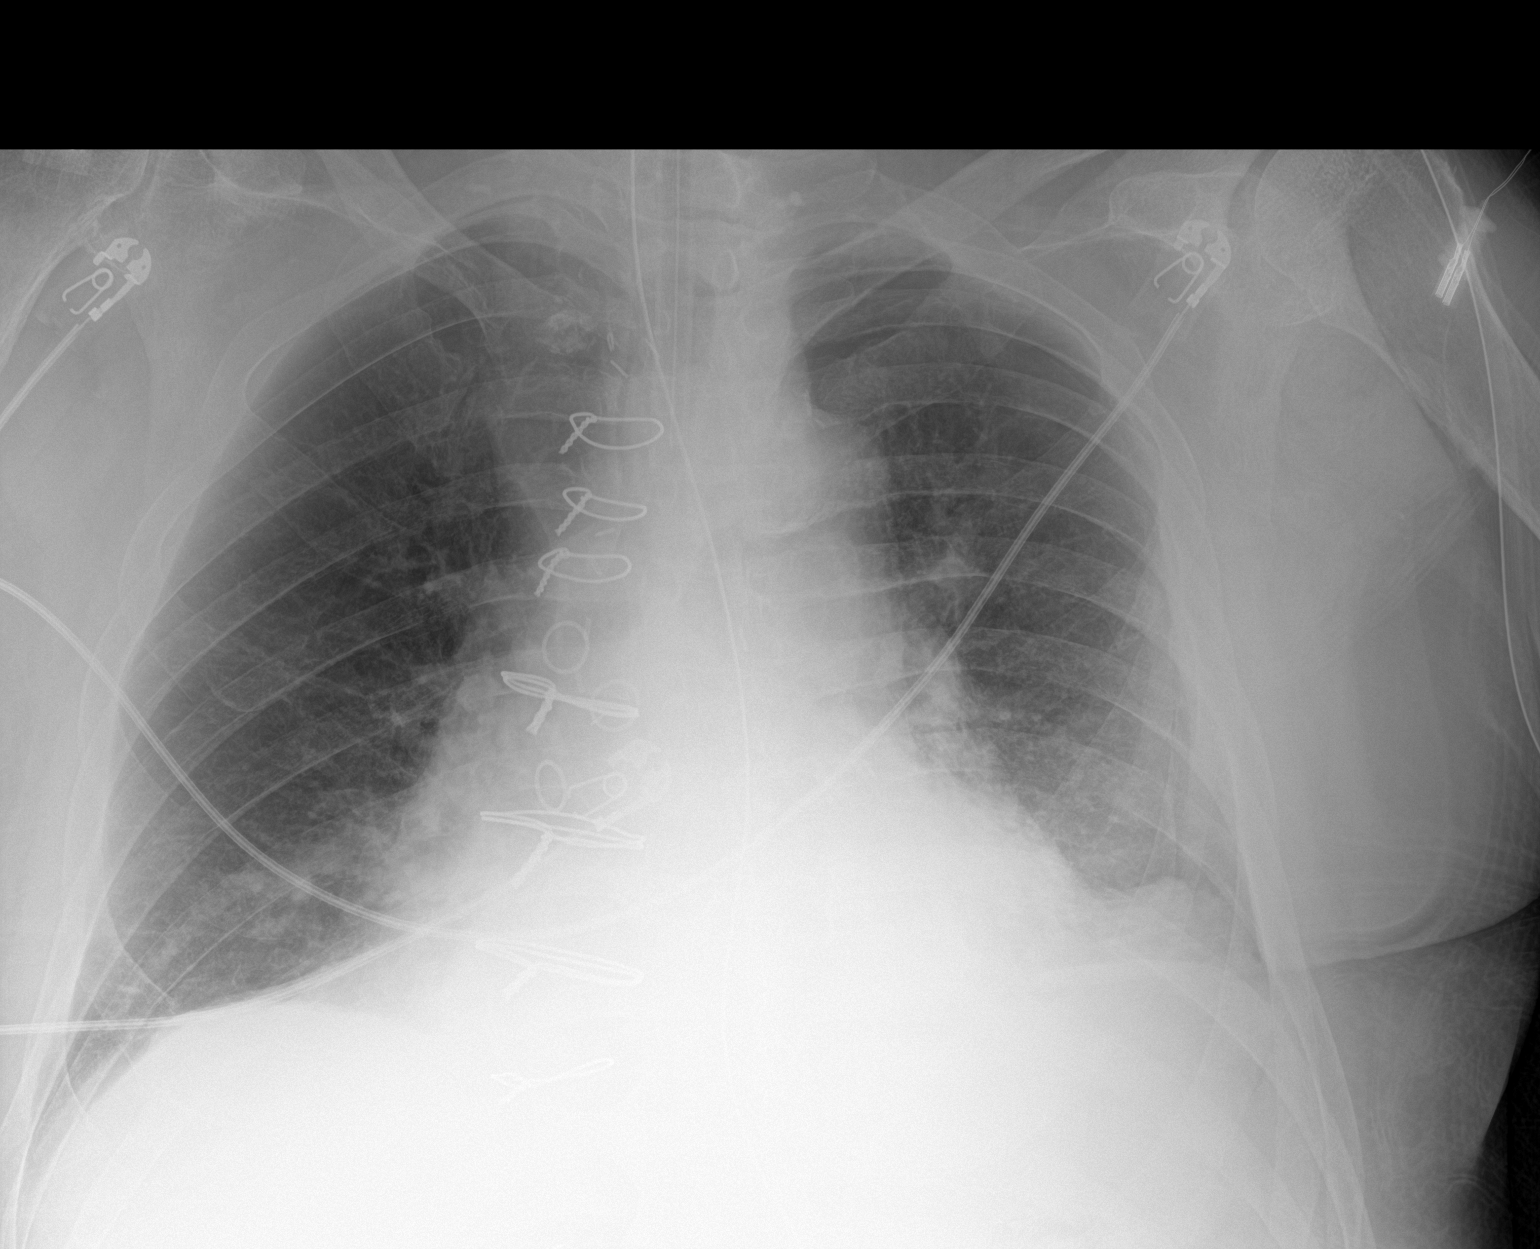

[1 of 1 positions shown; findings below may reference images not displayed]

FINDINGS: The small left apical pneumothorax is unchanged and multiple left
rib fractures are again noted. Opacity at the left lung base most
likely represents atelectasis. The right lung is clear. Cardiomegaly
is stable. The tip of the endotracheal tube is approximately 3.5 cm
above the carina. NG tube extends below the hemidiaphragm.
IMPRESSION: 1. Stable small left apical hydropneumothorax, and left rib
fractures.
2. Endotracheal tube tip 3.5 cm above the carina.
3. Bibasilar atelectasis left-greater-than-right. Stable
cardiomegaly.

## 2018-04-09 IMAGING — DX DG CHEST 1V PORT
1 series · 1 of 1 positions shown · non-contrast
Comparison: 01/09/2017 chest radiograph.

CLINICAL DATA: 83 y/o  M; follow-up of pneumothorax.

EXAM:
PORTABLE CHEST 1 VIEW

[chest ap]
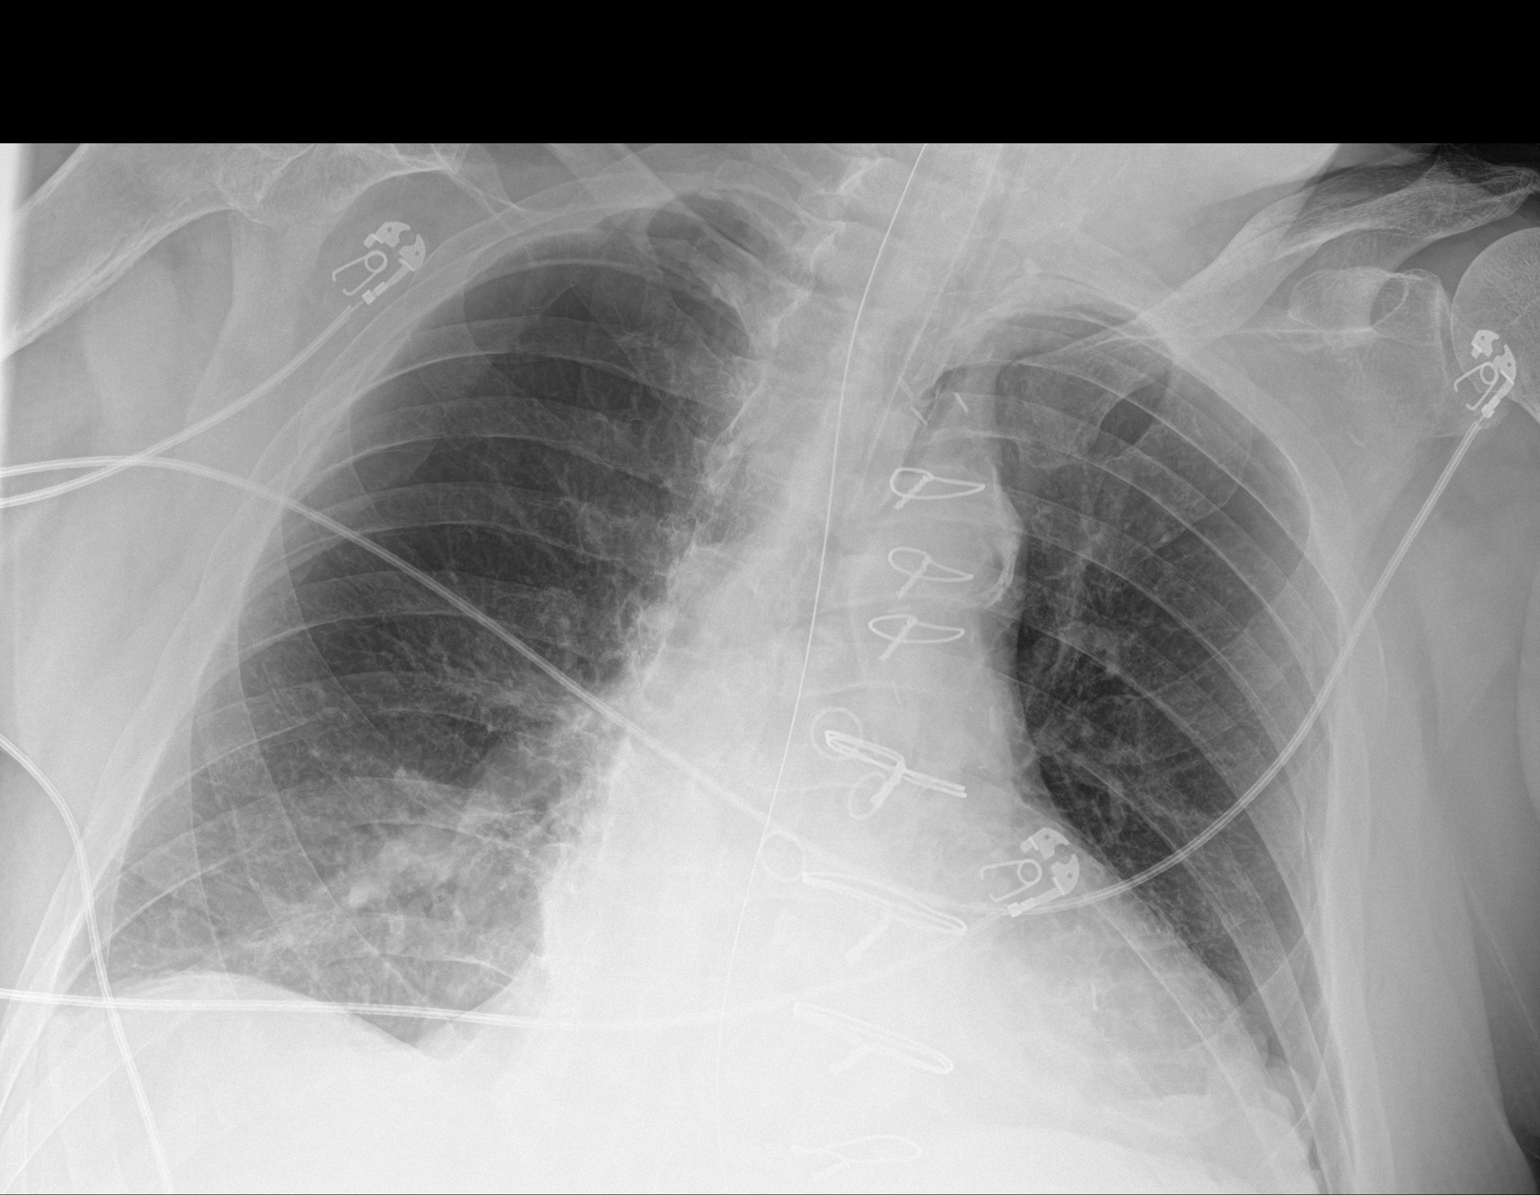

[1 of 1 positions shown; findings below may reference images not displayed]

FINDINGS: Stable cardiac silhouette. Post median sternotomy with wires in
alignment. Endotracheal tube 3.1 cm from carina. Enteric tube tip
below the field of view in the abdomen. Aortic atherosclerosis with
calcification. No focal consolidation. Stable small left
pneumothorax. Stable left-sided rib fractures.
IMPRESSION: Stable small left pneumothorax. Stable position of endotracheal and
enteric tubes.

By: Loko Domino M.D.

## 2018-04-09 IMAGING — DX DG CHEST 1V PORT
1 series · 1 of 1 positions shown · non-contrast
Comparison: Radiographs December 21, 2016. CT scan January 08, 2017.

CLINICAL DATA: Respiratory failure.

EXAM:
PORTABLE CHEST 1 VIEW

[chest ap]
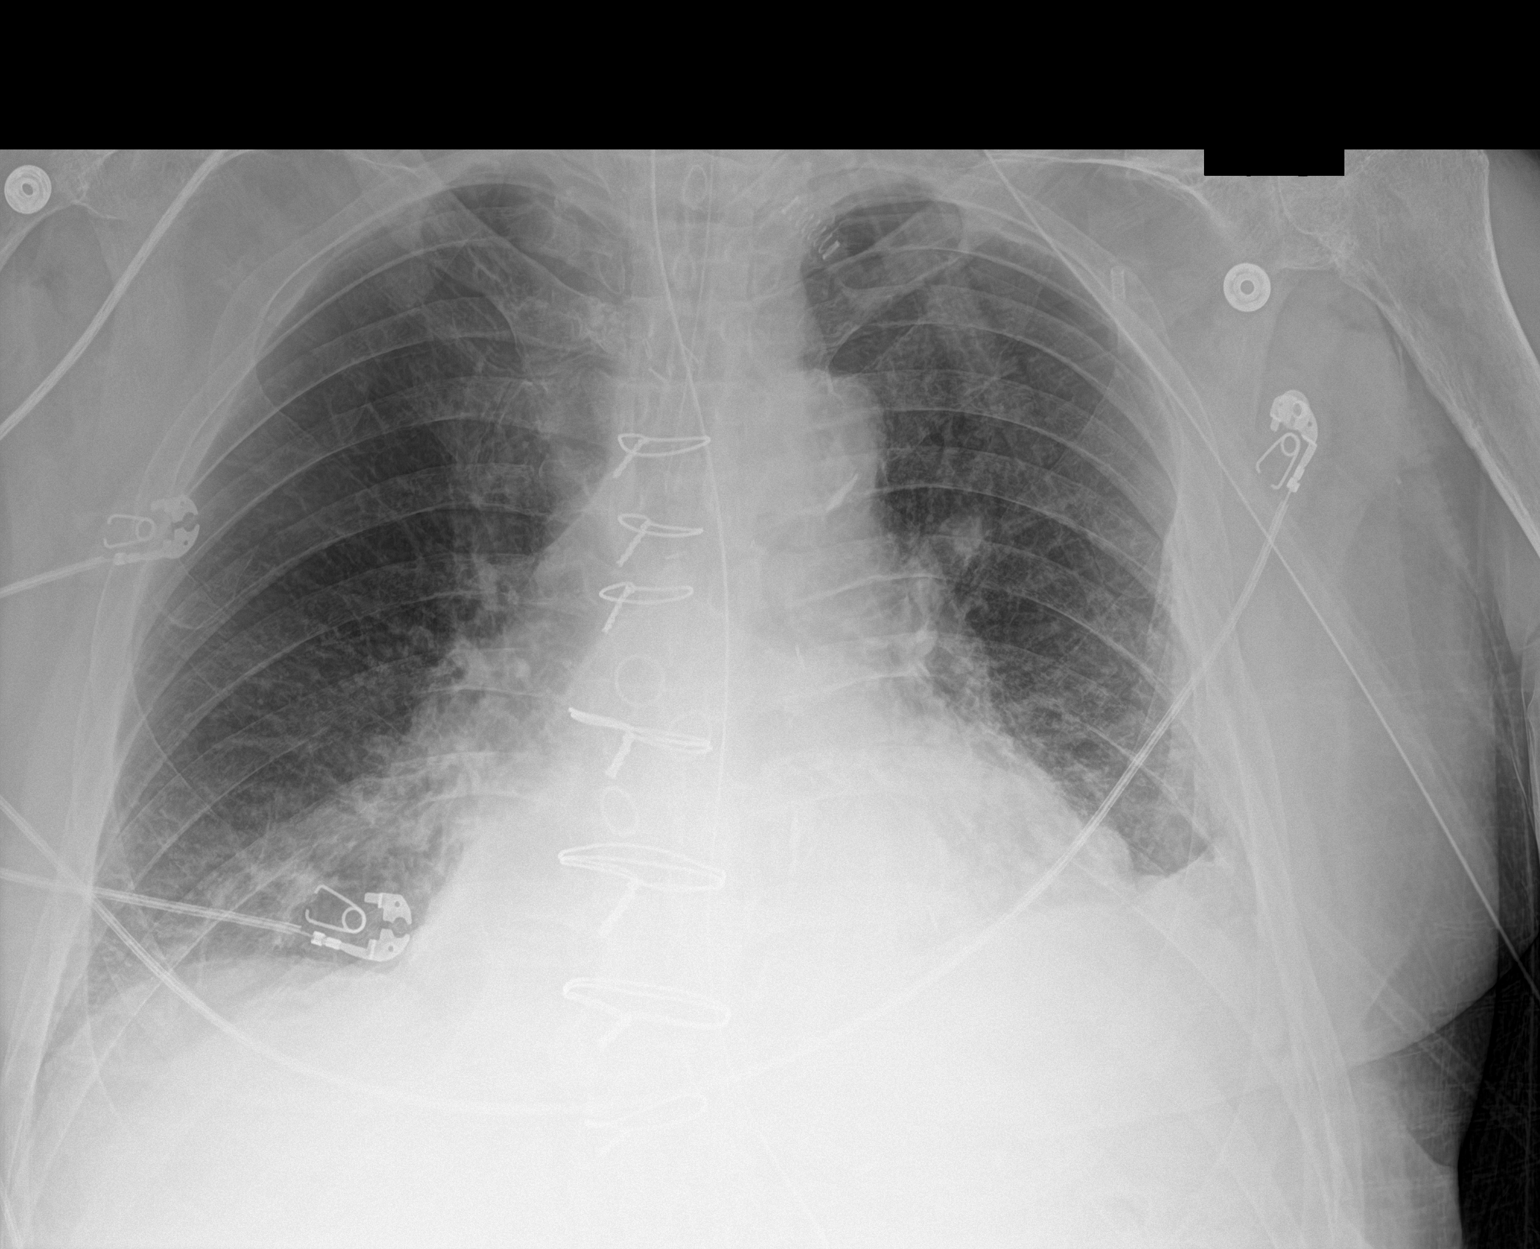

[1 of 1 positions shown; findings below may reference images not displayed]

FINDINGS: Stable cardiomediastinal silhouette. Endotracheal and nasogastric
tubes are unchanged in position. Status post coronary artery bypass
graft. Atherosclerosis of thoracic aorta is noted. Mild left apical
pneumothorax is noted. Stable bibasilar subsegmental atelectasis is
noted. Probable minimal left pleural effusion is noted. Left rib
fractures are noted.
IMPRESSION: Stable support apparatus. Mild left apical pneumothorax is noted on
the order approximately 10%. Critical Value/emergent results were
called by telephone at the time of interpretation on 01/09/2017 at
[DATE] to Kalocsai Akucs, the patient's nurse, who verbally
acknowledged these results and will immediately contact the
patient's physician.
